# Patient Record
Sex: Male | Born: 1950
Health system: Southern US, Community
[De-identification: ages and names within clinical notes are randomized; demographics above are authoritative.]

## PROBLEM LIST (undated history)

## (undated) DIAGNOSIS — I10 Essential (primary) hypertension: Secondary | ICD-10-CM

## (undated) DIAGNOSIS — IMO0001 Reserved for inherently not codable concepts without codable children: Secondary | ICD-10-CM

## (undated) DIAGNOSIS — M1990 Unspecified osteoarthritis, unspecified site: Secondary | ICD-10-CM

## (undated) DIAGNOSIS — U071 COVID-19: Secondary | ICD-10-CM

## (undated) DIAGNOSIS — Z87442 Personal history of urinary calculi: Secondary | ICD-10-CM

## (undated) DIAGNOSIS — Z794 Long term (current) use of insulin: Secondary | ICD-10-CM

## (undated) DIAGNOSIS — I639 Cerebral infarction, unspecified: Secondary | ICD-10-CM

## (undated) DIAGNOSIS — R0602 Shortness of breath: Secondary | ICD-10-CM

## (undated) DIAGNOSIS — I2510 Atherosclerotic heart disease of native coronary artery without angina pectoris: Secondary | ICD-10-CM

## (undated) DIAGNOSIS — C801 Malignant (primary) neoplasm, unspecified: Secondary | ICD-10-CM

## (undated) DIAGNOSIS — E785 Hyperlipidemia, unspecified: Secondary | ICD-10-CM

## (undated) DIAGNOSIS — E119 Type 2 diabetes mellitus without complications: Secondary | ICD-10-CM

## (undated) HISTORY — DX: COVID-19: U07.1

## (undated) HISTORY — DX: Type 2 diabetes mellitus without complications: E11.9

## (undated) HISTORY — PX: HERNIA REPAIR: SHX51

## (undated) HISTORY — PX: KNEE ARTHROSCOPY: SUR90

## (undated) HISTORY — DX: Reserved for inherently not codable concepts without codable children: IMO0001

## (undated) HISTORY — PX: CHOLECYSTECTOMY: SHX55

## (undated) HISTORY — DX: Hyperlipidemia, unspecified: E78.5

## (undated) HISTORY — DX: Long term (current) use of insulin: Z79.4

## (undated) HISTORY — PX: OTHER SURGICAL HISTORY: SHX169

## (undated) HISTORY — PX: CARDIAC CATHETERIZATION: SHX172

---

## 2003-03-31 ENCOUNTER — Emergency Department (HOSPITAL_COMMUNITY): Admission: EM | Admit: 2003-03-31 | Discharge: 2003-03-31 | Payer: Self-pay | Admitting: Emergency Medicine

## 2003-04-15 ENCOUNTER — Ambulatory Visit (HOSPITAL_COMMUNITY): Admission: RE | Admit: 2003-04-15 | Discharge: 2003-04-15 | Payer: Self-pay | Admitting: General Surgery

## 2003-05-13 ENCOUNTER — Ambulatory Visit (HOSPITAL_COMMUNITY): Admission: RE | Admit: 2003-05-13 | Discharge: 2003-05-13 | Payer: Self-pay | Admitting: Gastroenterology

## 2003-06-14 ENCOUNTER — Encounter (INDEPENDENT_AMBULATORY_CARE_PROVIDER_SITE_OTHER): Payer: Self-pay | Admitting: *Deleted

## 2003-06-14 ENCOUNTER — Ambulatory Visit (HOSPITAL_COMMUNITY): Admission: RE | Admit: 2003-06-14 | Discharge: 2003-06-15 | Payer: Self-pay | Admitting: General Surgery

## 2004-05-12 ENCOUNTER — Encounter: Admission: RE | Admit: 2004-05-12 | Discharge: 2004-05-12 | Payer: Self-pay | Admitting: General Surgery

## 2004-05-15 ENCOUNTER — Ambulatory Visit (HOSPITAL_BASED_OUTPATIENT_CLINIC_OR_DEPARTMENT_OTHER): Admission: RE | Admit: 2004-05-15 | Discharge: 2004-05-15 | Payer: Self-pay | Admitting: General Surgery

## 2004-05-15 ENCOUNTER — Ambulatory Visit (HOSPITAL_COMMUNITY): Admission: RE | Admit: 2004-05-15 | Discharge: 2004-05-15 | Payer: Self-pay | Admitting: General Surgery

## 2004-07-06 ENCOUNTER — Encounter (INDEPENDENT_AMBULATORY_CARE_PROVIDER_SITE_OTHER): Payer: Self-pay | Admitting: *Deleted

## 2004-07-06 ENCOUNTER — Inpatient Hospital Stay (HOSPITAL_COMMUNITY): Admission: EM | Admit: 2004-07-06 | Discharge: 2004-07-08 | Payer: Self-pay | Admitting: Emergency Medicine

## 2005-01-19 ENCOUNTER — Encounter: Admission: RE | Admit: 2005-01-19 | Discharge: 2005-01-19 | Payer: Self-pay | Admitting: Neurology

## 2005-10-01 ENCOUNTER — Ambulatory Visit (HOSPITAL_COMMUNITY): Admission: RE | Admit: 2005-10-01 | Discharge: 2005-10-02 | Payer: Self-pay

## 2008-05-03 ENCOUNTER — Encounter: Admission: RE | Admit: 2008-05-03 | Discharge: 2008-05-03 | Payer: Self-pay | Admitting: Cardiology

## 2008-05-16 ENCOUNTER — Inpatient Hospital Stay (HOSPITAL_COMMUNITY): Admission: AD | Admit: 2008-05-16 | Discharge: 2008-05-17 | Payer: Self-pay | Admitting: Cardiology

## 2010-06-18 LAB — CARDIAC PANEL(CRET KIN+CKTOT+MB+TROPI)
CK, MB: 3 ng/mL (ref 0.3–4.0)
Relative Index: 2.9 — ABNORMAL HIGH (ref 0.0–2.5)
Total CK: 104 U/L (ref 7–232)
Troponin I: 0.14 ng/mL — ABNORMAL HIGH (ref 0.00–0.06)

## 2010-06-18 LAB — BASIC METABOLIC PANEL
BUN: 12 mg/dL (ref 6–23)
CO2: 31 mEq/L (ref 19–32)
Calcium: 9.2 mg/dL (ref 8.4–10.5)
Chloride: 103 mEq/L (ref 96–112)
Creatinine, Ser: 0.9 mg/dL (ref 0.4–1.5)
GFR calc Af Amer: >60 mL/min (ref >60)
GFR calc non Af Amer: >60 mL/min (ref >60)
Glucose, Bld: 207 mg/dL — ABNORMAL HIGH (ref 70–99)
Potassium: 4.5 mEq/L (ref 3.5–5.1)
Sodium: 139 mEq/L (ref 135–145)

## 2010-06-18 LAB — CBC
HCT: 45.2 % (ref 39.0–52.0)
Hemoglobin: 15.8 g/dL (ref 13.0–17.0)
MCHC: 35 g/dL (ref 30.0–36.0)
MCV: 91.4 fL (ref 78.0–100.0)
Platelets: 212 10*3/uL (ref 150–400)
RBC: 4.94 MIL/uL (ref 4.22–5.81)
RDW: 13.3 % (ref 11.5–15.5)
WBC: 7.9 10*3/uL (ref 4.0–10.5)

## 2010-06-18 LAB — GLUCOSE, CAPILLARY
Glucose-Capillary: 170 mg/dL — ABNORMAL HIGH (ref 70–99)
Glucose-Capillary: 203 mg/dL — ABNORMAL HIGH (ref 70–99)
Glucose-Capillary: 209 mg/dL — ABNORMAL HIGH (ref 70–99)
Glucose-Capillary: 223 mg/dL — ABNORMAL HIGH (ref 70–99)
Glucose-Capillary: 225 mg/dL — ABNORMAL HIGH (ref 70–99)

## 2010-06-18 LAB — PROTIME-INR
INR: 0.9 (ref 0.00–1.49)
Prothrombin Time: 12.4 seconds (ref 11.6–15.2)

## 2010-07-21 NOTE — Discharge Summary (Signed)
Alan Wang, Alan Wang                 ACCOUNT NO.:  [id number]   MEDICAL RECORD NO.:  [id number]          PATIENT TYPE:  INP   LOCATION:  2507                         FACILITY:  [facility]   PHYSICIAN:  Cristy Hilts. Jacinto Halim, MD       DATE OF BIRTH:  1950-07-27   DATE OF ADMISSION:  05/16/2008  DATE OF DISCHARGE:  05/17/2008                               DISCHARGE SUMMARY   DISCHARGE DIAGNOSES:  1. Coronary artery disease, elective left anterior descending drug-      eluting stent placement this admission.  2. Residual fairly diffuse distal right coronary artery and circumflex      disease to be treated medically.  3. Preserved left ventricular function.  4. Type 2 non-insulin-dependent diabetes.  5. Treated hypertension.  6. Treated dyslipidemia.  7. Transient ischemic attack in 2006, previously on Aggrenox, that was      discontinued this admission.   HOSPITAL COURSE:  The patient is a 60 year old male with the above  medical history who presented for a screening Myoview in February 2010.  This revealed severe anterior wall ischemia.  The patient was seen by  Dr. Jacinto Halim.  He was set up for an outpatient catheterization, this was  done on May 16, 2008.  This revealed diffuse distal PLA disease with  left-to-right collaterals.  Circumflex had an 80% narrowing and 99%  narrowing after the OM, and appeared to be small vessel.  There was a  large ramus intermedius branch with 40% stenosis in midvessel.  The LAD  had some proximal calcification.  After the third diagonal, there was a  95% long stenosis.  This was treated with XIENCE stents.  His EF was 50-  60%.  He tolerated the procedure well.  Abdominal angiogram showed no  significant iliac disease or renal artery disease.  Dr. Allyson Sabal feels he  can be discharged on May 17, 2008.   DISCHARGE MEDICATIONS:  1. Hyzaar 100/25 daily.  2. Cymbalta 30 mg a day.  3. Lipitor 20 mg a day.  4. Humulin 24 units in the morning and 26 units in the  evening.  5. Novolin on a sliding scale, usually takes 24 units in the morning      and 38 units in the evening.  6. Vitamin C.  7. Multivitamin daily.  8. Fish oil 1200 mg a day.  9. Aspirin 325 mg a day.  10.Plavix 75 mg a day.  11.Coreg 3.125 b.i.d.  12.Nitroglycerin 0.4 mg sublingual p.r.n.  13.He will resume his metformin 1 g b.i.d. on May 19, 2008.   LABORATORIES:  White count 7.9, hemoglobin 15.8, hematocrit 45.2, and  platelets 212.  Sodium 139, potassium 4.5, BUN 12, and creatinine 0.9.  His troponin was 0.14 in the afternoon of May 16, 2008.  EKG showed  sinus rhythm without acute changes.   DISPOSITION:  The patient is discharged in stable condition after  elective LAD drug-eluting stent placement.  He will follow up with Dr.  Jacinto Halim in a couple of weeks.      Abelino Derrick, P.A.  Cristy Hilts. Jacinto Halim, MD  Electronically Signed    LKK/MEDQ  D:  05/17/2008  T:  05/17/2008  Job:  [id number]

## 2010-07-21 NOTE — Cardiovascular Report (Signed)
NAMESEENA, FACE                 ACCOUNT NO.:  [id number]   MEDICAL RECORD NO.:  [id number]          PATIENT TYPE:  INP   LOCATION:  2807                         FACILITY:  [facility]   PHYSICIAN:  Cristy Hilts. Jacinto Halim, MD       DATE OF BIRTH:  1950-06-11   DATE OF PROCEDURE:  DATE OF DISCHARGE:                            CARDIAC CATHETERIZATION   PROCEDURE PERFORMED:  Percutaneous transluminal coronary angioplasty and  stenting of the mid LAD.   INDICATIONS:  [name] is a 57-year gentleman with hypertension,  hyperlipidemia, diabetes,who has a markedly abnormal stress test  revealing antral wall ischemia.  He underwent cardiac catheterization at  [facility] a week ago, revealing a high-grade 99% stenosis  in the mid LAD.  Now, he is brought for elective angioplasty of the  same.  He has severe multivessel disease including circumflex coronary  artery, which has diffuse long segment, 80% stenosis at the ostium and  99% stenosis in the mid segment.  However, these vessels were felt to be  small.  Hence, he is brought for elective angioplasty to the LAD.   INTERVENTION:  Please see my dictation dated May 10, 2008 for  angiographic data.   Briefly left main coronary artery is large-caliber vessel with a mild  calcification.   Circumflex coronary artery is a moderate caliber vessel with severe  ostial long segment 80% stenosis followed by 99% stenosis.  The origin  of a small obtuse marginal 1, which also has an ostial 90% stenosis.  The ramus intermediate is a moderate caliber vessel with mild diffuse  luminal irregularity.  LAD is calcified diffusely.  After the origin of  a moderate-sized diagonal 3, there is a 80% stenosis followed by a  tandem 99% stenosis in the mid segment.   INTERVENTION DATA:  Successful PTCA and stenting of the mid LAD with  implantation of a 3.0 x 23-mm XIENCE stent overlapped distally with a  2.75 x 12-mm XIENCE stent.  These stents were  post-dilated with a 3.0 x  15-mm Sprinter [place] at 18 atmospheric pressures.  Overall, the stenosis  was reduced from 99% to 0% with brisk TIMI III-TIMI III flow maintained  at end of the procedure.   RECOMMENDATIONS:  The patient will need aggressive risk modification.  He will need a surveillance stress Myoview probably in 6 months.  Given  the fact that the circumflex coronary artery is diffusely diseased.  I  may consider high risk intervention if the ischemic burden is severe.   A total of 175 mL of contrast was utilized for diagnostic angiography.   Please note, during the balloon inflation stenting, there was  significant ST elevations were noted anteriorly.   The patient remained asymptomatic during the entire procedure.   TECHNIQUE OF PROCEDURE:  Usual sterile precautions using a 7-French left  femoral arterial access, a 7-French XB 3.5 guide was utilized to engage  left main coronary artery.  Using Angiomax for anticoagulation, a 190-cm  wide, 0.014th of an inch Asahi [place] guidewire was utilized to cross  into the LAD and  the lesion was scored using a AngioSculpt 2.5 x 10 mm  balloon and multiple inflations were performed at peak of 6 atmospheric  pressures anywhere from 60-100 seconds.  Following this, I implanted a  2.75 x 12-mm XIENCE and a 3.0 x 23-mm XIENCE stent distally and  proximally overlapping each other and postdilated this with a Voyager [place]  3.0 x 15-mm balloon at peak of 18 atmospheric pressure for 40 seconds.  Having performed this angiography an intracoronary nitroglycerin was  administered and excellent results were documented.  The guidewire was  withdrawn angiographically.  Guide catheter disengaged and pulled out of  body.   Left femoral arteriography was performed through the arterial access  sheath and access closed with StarClose with excellent hemostasis.  The  patient tolerated the procedure well.  No immediate complications were  noted       Vonna Kotyk R. Jacinto Halim, MD  Electronically Signed     JRG/MEDQ  D:  05/16/2008  T:  05/16/2008  Job:  [id number]   cc:   Dorisann Frames, M.D.

## 2010-07-24 NOTE — Op Note (Signed)
NAME:  Alan Wang, Alan Wang                           ACCOUNT NO.:  [id number]   MEDICAL RECORD NO.:  [id number]                   PATIENT TYPE:  AMB   LOCATION:  ENDO                                 FACILITY:  [facility]   PHYSICIAN:  James L. Malon Kindle., M.D.          DATE OF BIRTH:  [birth year]   DATE OF PROCEDURE:  05/13/2003  DATE OF DISCHARGE:                                 OPERATIVE REPORT   PROCEDURE:  Colonoscopy.   MEDICATIONS:  Fentanyl 100 mcg, Versed 10 mg IV.   INSTRUMENT USED:  Olympus pediatric adjustable colonoscope.   INDICATIONS FOR PROCEDURE:  Strong family history of colon polyps.   DESCRIPTION OF PROCEDURE:  The procedure had been explained to the patient  and consent obtained.  With the patient in the left lateral decubitus  position, the Olympus scope was inserted and advanced.  The prep was  excellent.  We were able to advance to the cecum.  The ileocecal valve and  appendiceal orifice were seen.  The scope was withdrawn and the cecum,  ascending colon, transverse colon, splenic flexure, descending and sigmoid  colon were seen well.  There was no diverticular disease and no polyps were  seen.  The scope was withdrawn in the rectum.  There were internal  hemorrhoids in the anal canal.  The scope was withdrawn.  The patient  tolerated the procedure well.   ASSESSMENT:  1. Family history of colon polyps.  V19.8  2. Internal hemorrhoids.  455.0   PLAN:  Will recommend yearly hemoccults and repeat the procedure in five  years.                                               James L. Malon Kindle., M.D.    Waldron Session  D:  05/13/2003  T:  05/13/2003  Job:  [id number]   cc:   Leonie Man, M.D.  [address]  [address]  [place]  [place] [address]  Fax: [phone]   Margaretmary Bayley, M.D.  [address], [address]  [place]  [place] [address]  Fax: [phone]

## 2010-07-24 NOTE — H&P (Signed)
NAMEKENRIC, GINGER NO.:  [id number]   MEDICAL RECORD NO.:  [id number]          PATIENT TYPE:  INP   LOCATION:  1824                         FACILITY:  [facility]   PHYSICIAN:  Marlan Palau, M.D.  DATE OF BIRTH:  01-14-1951   DATE OF ADMISSION:  07/06/2004  DATE OF DISCHARGE:                                HISTORY & PHYSICAL   HISTORY OF PRESENT ILLNESS:  Alan Wang is a 59 year old right-handed  white male born 07-28-1950 with a history of obesity, diabetes,  hypertension.  Patient is brought into the hospital at this point for an  evaluation of a transient event of speech disturbance, possible left arm  weakness.  Patient was at work at the time, noted around 8:30 a.m. had  fairly sudden onset of difficulty with speech.  Patient knew what he wanted  to say, but when he tried to speak noted that he was mixing up words.  Patient could understand what was being said to him, however.  Patient also  was unable to use a calculator, could not add/subtract normally.  Patient  was brought to the hospital.  On the way to the hospital noted some left  upper extremity weakness, he believes.  Patient has had some resolution of  the above deficits, but has felt headache, some nausea, queasiness, no  vomiting.  Patient otherwise at this point feels his speech has returned to  normal and has had no weakness or numbness.  Patient denied any visual field  deficit with the above event.  Patient denies any previous episodes similar  to this.  CT of the head done through the emergency room is unremarkable.  Neurology is called for further evaluation.   PAST MEDICAL HISTORY:  1.  Probable transient ischemic attack associated with aphasia and possible      left upper extremity weakness as above.  2.  Hypertension.  3.  Diabetes.  4.  Gallbladder resection in the past.  5.  History of hernia surgery six weeks ago.  6.  History of cataracts, but has not had surgery for  this.   CURRENT MEDICATIONS:  1.  Fortamet SR 1000 mg twice a day.  2.  NovoLog U 134 units in the morning, 36 units at night.  3.  NovoLog N 40 units in the morning, 42 units at night.  4.  Cozaar 100 mg a day.  5.  Lasix 40 mg a day.  6.  Patient takes two Aleve daily.  7.  Tylenol for arthritis.  8.  Vitamin C 1000 mg daily.  9.  Centrum Silver vitamins one a day.  10. Aspirin 81 mg a day.  11. Fish oil 1000 mg daily.  12. Alfalfa 650 mg daily.   ALLERGIES:  Patient has some intolerance to AVANDIA, but no other drug  allergies.   HABITS:  Patient does not smoke.  Drinks alcohol on occasion.   SOCIAL HISTORY:  This patient lives in the [place] area.  Is married.  Has two children who are alive and well.  Patient owns  a car  parts store.   FAMILY HISTORY:  Notable in that mother is alive with arthritis problems.  Father is alive with low back pain, diabetes.  Patient has one sister with  diabetes, chronic fatigue syndrome.  There is a paternal great grandmother  with diabetes.  Maternal grandmother with stroke.  No family history of  cancer.  Both grandfathers have heart disease.   REVIEW OF SYSTEMS:  No recent fevers or chills.  Patient does note a  headache at this point.  Denies neck pain, shortness of breath, chest pain,  palpitations of the heart.  Denies abdominal pain, but has had some  queasiness.  Denies any problems controlling the bowels or bladder.  Has had  some recent diarrhea, however, associated immediately after the hernia  surgery.  Denies blackout spells.   PHYSICAL EXAMINATION:  VITAL SIGNS:  Blood pressure 180/101, heart rate 90,  respiratory rate 18, temperature afebrile.  GENERAL:  This patient is a moderately obese white male who is alert and  cooperative at the time of examination.  HEENT:  Head is atraumatic.  Eyes:  Pupils are equal, round, and reactive to  light.  Disks are flat bilaterally.  NECK:  Supple.  No carotid bruits  noted.  RESPIRATORY:  Clear.  CARDIOVASCULAR:  Regular rate and rhythm.  No obvious murmurs or rubs noted.  ABDOMEN:  Positive bowel sounds.  No organomegaly or tenderness noted.  EXTREMITIES:  Without significant edema.  NEUROLOGIC:  Cranial nerves as above.  Facial symmetry is present.  Patient  has good sensation to the face to pin prick, soft touch bilaterally.  Has  good strength to facial muscles, muscles to head turning, shoulder shrug  bilaterally.  Speech is well enunciated, not aphasic.  Motor test reveals  5/5 strength in all fours.  Good symmetric motor tone is noted throughout.  Sensory testing is intact to pin prick, soft touch, vibratory sensation  throughout.  Patient has good finger-nose-finger, heel-to-shin bilaterally.  No drift is seen.  Deep tendon reflexes depressed, but symmetric.  Toes are  neutral bilaterally.  Patient was not ambulated.  No aphasia, again, is  noted on today's evaluation.   CT of the head is unremarkable.  Blood work to date reveals a white count of  8.1, hemoglobin of 15.8, hematocrit of 46.1, MCV of 87.7, platelets of 271.  The rest of the blood work is still pending.   EKG, chest x-ray are pending.   IMPRESSION:  1.  History of diabetes.  2.  Hypertension.  3.  New onset transient aphasia, probable transient ischemic attack event.   The patient had some positive aphasia that lasted greater than an hour.  Patient is now at his baseline.  Will admit for further evaluation of  transient ischemic attack.   PLAN:  1.  MRI scan of the brain.  2.  MR angiogram with intracranial/extracranial vessels.  3.  2-D echocardiogram.  4.  Aspirin therapy.  Will follow patient's clinical course while in-house.      CKW/MEDQ  D:  07/06/2004  T:  07/06/2004  Job:  [id number]   cc:   Margaretmary Bayley, M.D.  [address], [address]  [place]  [place] [address]  Fax: [phone]

## 2010-07-24 NOTE — Op Note (Signed)
NAME:  Alan Wang, Alan Wang                           ACCOUNT NO.:  [id number]   MEDICAL RECORD NO.:  [id number]                   PATIENT TYPE:  OIB   LOCATION:  5727                                 FACILITY:  [facility]   PHYSICIAN:  Leonie Man, M.D.                DATE OF BIRTH:  1951-02-15   DATE OF PROCEDURE:  06/14/2003  DATE OF DISCHARGE:                                 OPERATIVE REPORT   PREOPERATIVE DIAGNOSIS:  Chronic calculus cholecystitis.   POSTOPERATIVE DIAGNOSIS:  Chronic calculus cholecystitis.   PROCEDURE:  Laparoscopic cholecystectomy with interoperative cholangiogram.   SURGEON:  Luisa Hart L. Lurene Shadow, M.D.   ASSISTANT:  Lebron Conners, M.D.   ANESTHESIA:  General.   NOTE:  This patient is a 60 year old diabetic man with the onset of  epigastric and right upper quadrant abdominal pain which, on evaluation by  ultrasound, shows non-shadowing gallstones.  He has a negative HIDA scan and  normal liver function tests.  He comes to the operating room now for  laparoscopic cholecystectomy and interoperative cholangiogram after all  questions have been answered and consent obtained.   PROCEDURE:  Following the induction of satisfactory general anesthesia with  the patient positioned supine, the abdomen was prepped and draped routinely.  Open laparoscopy created at the umbilicus with insertion of a Hasson cannula  and insufflation of the peritoneal cavity to 14 mmHg pressure using carbon  dioxide.  The camera was inserted and visual exploration of the abdomen  shows the liver edge to be sharp.  Liver surfaces smooth.  There were  multiple adhesions to the gallbladder from chronic inflammation.  None of  the small or large intestines viewed appeared to be abnormal.   Under direct vision, epigastric and lateral ports were placed.  The  gallbladder was grasped and retracted cephalad and multiple adhesions taken  down from the wall of the gallbladder and dissection was carried  down to the  region of the ampulla with isolation of the cystic artery and cystic duct.  The cystic artery is isolated, doubly clipped and transected.  The cystic  duct was isolated carrying the cystic duct to the cystic duct common duct  gallbladder and to the cystic duct gallbladder junction.  The cystic duct is  clipped proximally and opened and a Riddick catheter passed into the abdomen  through a 14 gauge Angiocath and inserted into the cystic duct and a cystic  duct cholangiogram carried out with Renografin.  The resulting cholangiogram  showed free flow of contrast into the duodenum, no filling defects were  noted.  The cystic duct catheter was removed and the cystic duct doubly  clipped and transected.   The gallbladder was then dissected free from the liver bed using  electrocautery and maintaining hemostasis along the course of the  gallbladder.  A single perforation was made of the gallbladder with some  spillage of bile, but there was  no spillage of any stones.  The gallbladder  was removed and the liver bed checked for hemostasis and additional bleeding  points treated with electrocautery.  The gallbladder was placed in an  endopouch and removed from the abdomen without difficulty.  The right upper  quadrant was thoroughly irrigated with normal saline.  This was all  aspirated free.  Sponge, instrument and sharp counts were verified.  The  wounds were closed in layers.  The umbilical wound was closed in two layers  with 0 Dexon and 4-0 Monocryl.  The epigastric and lateral flank wound  was closed with 4-0 Monocryl.  All wounds had Steri-Strips and sterile  dressings applied, the anesthetic reversed, and the patient removed from the  operating room to the recovery room in stable condition, having tolerated  the procedure well.                                               Leonie Man, M.D.    PB/MEDQ  D:  06/14/2003  T:  06/15/2003  Job:  [id number]   cc:   Margaretmary Bayley, M.D.  [address]  [place]  [place] [address]  Fax: [phone]

## 2010-07-24 NOTE — Discharge Summary (Signed)
NAMEJARRAD, Alan Wang                 ACCOUNT NO.:  [id number]   MEDICAL RECORD NO.:  [id number]          PATIENT TYPE:  INP   LOCATION:  3032                         FACILITY:  [facility]   PHYSICIAN:  Pramod P. Pearlean Brownie, MD    DATE OF BIRTH:  January 10, 1951   DATE OF ADMISSION:  07/06/2004  DATE OF DISCHARGE:  07/08/2004                                 DISCHARGE SUMMARY   DISCHARGE DIAGNOSES:  1.  Left brain transient ischemic attack.  2.  Diabetes.  3.  Hypertension.  4.  Obesity.  5.  Status post hernia repair.  6.  Status post gallbladder resection.  7.  Cataracts.   DISCHARGE MEDICATIONS:  1.  Aggrenox one p.o. daily x14 days then increase to b.i.d.  2.  Tylenol two tablets 30 minutes to one hour prior to Aggrenox dose for      first week.  3.  Cozaar 100 mg a day.  4.  Lasix 20 mg a day.  5.  Fortamet 1000 mg b.i.d.  6.  NovoLog N 40 units q.a.m. and 40 units q.p.m.  7.  NovoLog R 34 units q.a.m. and 36 units q.p.m.  8.  Aleve two tablets every morning.  9.  Vitamin C 1000 mg a day.  10. Centrum Silver one a day.  11. Fish oil 1000 mg a day.  12. Alfalfa 650 mg a day.  13. Ultram 50 mg may take one to two tablets q.6h. p.r.n. headache.   STUDIES PERFORMED:  1.  CT of the brain shows no acute abnormality.  2.  MRI of the brain shows no acute abnormality.  3.  MRA of the neck shows mild stenosis proximal right internal carotid      artery.  4.  MRA of the head is normal.  5.  Chest x-ray shows no acute or significant findings or slight      peribronchial thickening.  6.  EKG shows normal sinus rhythm, left atrial enlargement.  Cannot rule out      inferior infarct, age undetermined.  Nonspecific T-wave abnormality.  7.  A 2-D echocardiogram  has ejection fraction of 55 to 65% with inability      to evaluate LV wall motion.  Cannot rule out embolic source.   LABORATORY STUDIES:  Hemoglobin A1c 7.8.  Homocystine normal.  Cholesterol  160, triglycerides 228, HDL 41, and LDL 73.   CBC was normal.  Coagulation  studies with PT of 11.3, otherwise normal.  Chemistry normal except for  elevated glucose at 168. Liver function tests were normal.  Cardiac enzymes  were negative x1.   HISTORY OF PRESENT ILLNESS:  [name] is a 60 year old right-handed  white male who has a history of hypertension, diabetes and obesity who is  brought into the hospital the day of admission to evaluate a transient event  of speech disturbance and possible left arm weakness.  Patient was at work  at the time and noted around 8:30 a.m., had sudden onset of difficulty with  speech.  The patient knew what he wanted to say but when he  tried to speak,  noted that he was mixing up his words.  The patient stated he could  understand what was being said to him.  Patient was brought to the hospital.  Once in the emergency room, he believed his speech had returned to normal  and he had no weakness.  Patient denies any previous similar episode.  CT of  the head in the emergency room is unremarkable.  Neurology was called to  evaluate further.  Patient will be admitted to the hospital for further work-  up.  He did not receive TPA due to quick resolution of symptoms.   HOSPITAL COURSE:  MRI was unrevealing for an acute infarct but patient was  felt to have a left brain TIA caused in part by risk factors of poorly  controlled diabetes with elevated hemoglobin A1c and elevated blood  pressures in the 190s/100 ranges.  Patient also with poor diet at home.  He  adjusts his insulin based on his perceived needs.  He is overweight and he  does not exercise.   CONDITION ON DISCHARGE:  Patient alert and oriented x3.  Speech clear.  No  aphasia.  His strength is normal.  His face is symmetric.  His sensation is  intact and he has no ataxia.  His chest is clear to auscultation.  His heart  rate is regular.   PLAN:  1.  Discharge home with wife.  2.  Aggrenox for secondary stroke prevention.  3.   Patient needs tight risk factor control including diabetes and      hypertension.  4.  Will refer for nutrition management for uptake on diabetes education for      classes for he and his wife.  5.  Ultram as needed for headache caused by Aggrenox.  Headaches resolved      after one week.      SB/MEDQ  D:  07/08/2004  T:  07/08/2004  Job:  [id number]   cc:   Margaretmary Bayley, M.D.  [address]  [place]  [place] [address]  Fax: [phone]   Leonie Man, M.D.  [address]  [address]  [place]  [place] [address]

## 2010-07-24 NOTE — Op Note (Signed)
Alan Wang, Alan Wang                 ACCOUNT NO.:  [id number]   MEDICAL RECORD NO.:  [id number]          PATIENT TYPE:  AMB   LOCATION:  DAY                          FACILITY:  [facility]   PHYSICIAN:  Lebron Conners, M.D.   DATE OF BIRTH:  1950/06/08   DATE OF PROCEDURE:  10/01/2005  DATE OF DISCHARGE:                                 OPERATIVE REPORT   PREOPERATIVE DIAGNOSIS:  Incisional hernia.   POSTOPERATIVE DIAGNOSIS:  Incisional hernia.   OPERATION:  Incisional hernia repair.   SURGEON:  Dr. Orson Slick.   ANESTHESIA:  General.   SPECIMEN:  None.   BLOOD LOSS:  Minimal.   COMPLICATIONS:  None.  The patient to PACU in good condition.   PROCEDURE:  After the patient was monitored and asleep and had routine  preparation and draping of the abdomen, I made a short supraumbilical  midline incision utilizing most of the previous incision and taking it  upward just slightly.  I dissected down through the subcutaneous tissue and  scar until I came to an obvious hernia sac and some associated mesh.  The  defect seemed to be at the upper end of a previous hernia repair.  I  dissected all of the sac and mesh away from the fascia in that region and  saw a fairly discrete deficit.  I dissected down a bit over the mesh and as  I did so I could feel that there was another defect immediately under the  umbilicus.  I extended my incision downward around the umbilicus and  dissected the umbilicus totally away from the fascia and previous hernia  repair and identified a second defect.  I dissected along the healthy fascia  caudad for 3-4 cm away from that and then I freed up the hernia defect and  previous hernia repair, all the way around getting at least 3-4 cm of  dissection in the deep subcutaneous plane just superficial to the muscular  fascia all way around.  I then freed up sac from the edges of the fascia and  reduced it and closed both of the hernia defects with 2-0 Prolene running  and  interrupted suture.  I then trimmed a piece of polypropylene mesh to fit  the dissected defect and I sewed that in near the edges of the dissection  with running basting 2-0 Prolene stitch through and through the mesh and  taking nice deep bites of the abdominal fascia.  I felt that reinforced the  hernia repair quite adequately.  I used a few sutures to sew the mesh down  to the fascia in the middle of it.  I then placed a 10-mm suction drain  through a separate stab incision, sewed that in with 2-0 Prolene, and placed  it so it lay comfortably across this mesh.  I closed the subcutaneous  tissues with running 3-0 Vicryl, sewing it down to the mesh in several  places and sewing the umbilical  skin down to the mesh to reconstitute normal anatomy.  I then closed the  skin with staples and applied suction to the  bulb of the suction drain.  It  held suction nicely.  Hemostasis was good.  Sponge, needle and instrument  counts were correct.  The patient went to PACU in stable condition after  application of a bandage.      Lebron Conners, M.D.  Electronically Signed     WB/MEDQ  D:  10/01/2005  T:  10/01/2005  Job:  [id number]   cc:   Margaretmary Bayley, M.D.  Fax: [phone]

## 2010-07-24 NOTE — Op Note (Signed)
NAMEBARNELL, SHIEH                 ACCOUNT NO.:  [id number]   MEDICAL RECORD NO.:  [id number]          PATIENT TYPE:  AMB   LOCATION:  DSC                          FACILITY:  [facility]   PHYSICIAN:  Leonie Man, M.D.   DATE OF BIRTH:  [birth year]   DATE OF PROCEDURE:  05/15/2004  DATE OF DISCHARGE:                                 OPERATIVE REPORT   PREOPERATIVE DIAGNOSIS:  Incisional hernia of the umbilicus.   POSTOPERATIVE DIAGNOSIS:  Incisional hernia of the umbilicus.   PROCEDURE:  Repair incisional hernia with mesh.   SURGEON:  Leonie Man, M.D.   ASSISTANT:  OR tech.   ANESTHESIA:  General.   SPECIMENS:  No specimens were sent.   BLOOD LOSS:  Minimal.   COMPLICATIONS:  No immediate complications.   The patient is a 60 year old man who is status post laparoscopic  cholecystectomy and he developed incisional hernia just above the umbilicus  in his trocar site.  He comes to the operating room now for repair after the  risks and potential benefits of surgery have been discussed and all  questions answered.   PROCEDURE:  Following induction of satisfactory general anesthesia, the  patient is positioned supinely. The abdomen is routinely prepped and draped  to be included in a sterile operative field. The old midline incision was  opened up through skin, subcutaneous tissue, dissecting down to the hernia  sac and dissecting around to free the edges of the hernia sac. Hernia sac is  amputated and discarded. The defect is cleared in all sides and a Bard  medium-sized plug is placed into the defect and sutured into the fascial  edge with interrupted 0 Novofil sutures. The repair is noted to be intact.  Sponge and instrument counts were verified and the abdominal wound closed in  layers as follows. The subcutaneous tissues closed with a running 3-0 Vicryl  suture and the skin closed with running 4-0 Monocryl, cruciate suture placed  intradermally;  wound was then reinforced  with Steri-Strips and a sterile  dressing applied. Anesthetic reversed. The patient removed from the  operating room to the recovery room in stable condition.  He tolerated the  procedure well.    PB/MEDQ  D:  05/15/2004  T:  05/15/2004  Job:  [id number]

## 2011-03-05 ENCOUNTER — Other Ambulatory Visit (HOSPITAL_COMMUNITY): Payer: Self-pay | Admitting: Cardiology

## 2011-03-05 DIAGNOSIS — R9439 Abnormal result of other cardiovascular function study: Secondary | ICD-10-CM

## 2011-03-08 ENCOUNTER — Other Ambulatory Visit: Payer: Self-pay

## 2011-03-08 ENCOUNTER — Ambulatory Visit (HOSPITAL_COMMUNITY)
Admission: RE | Admit: 2011-03-08 | Discharge: 2011-03-09 | Disposition: A | Discharge status: Home / Self Care | Payer: 59 | Source: Ambulatory Visit | Attending: Cardiology | Admitting: Cardiology

## 2011-03-08 ENCOUNTER — Encounter (HOSPITAL_COMMUNITY): Payer: Self-pay | Admitting: Cardiology

## 2011-03-08 ENCOUNTER — Encounter (HOSPITAL_COMMUNITY)
Admission: RE | Disposition: A | Discharge status: Home / Self Care | Payer: Self-pay | Source: Ambulatory Visit | Attending: Cardiology

## 2011-03-08 DIAGNOSIS — E1165 Type 2 diabetes mellitus with hyperglycemia: Secondary | ICD-10-CM | POA: Insufficient documentation

## 2011-03-08 DIAGNOSIS — E1169 Type 2 diabetes mellitus with other specified complication: Secondary | ICD-10-CM

## 2011-03-08 DIAGNOSIS — I10 Essential (primary) hypertension: Secondary | ICD-10-CM

## 2011-03-08 DIAGNOSIS — E785 Hyperlipidemia, unspecified: Secondary | ICD-10-CM | POA: Insufficient documentation

## 2011-03-08 DIAGNOSIS — Z8673 Personal history of transient ischemic attack (TIA), and cerebral infarction without residual deficits: Secondary | ICD-10-CM | POA: Insufficient documentation

## 2011-03-08 DIAGNOSIS — IMO0002 Reserved for concepts with insufficient information to code with codable children: Secondary | ICD-10-CM | POA: Insufficient documentation

## 2011-03-08 DIAGNOSIS — I2510 Atherosclerotic heart disease of native coronary artery without angina pectoris: Secondary | ICD-10-CM | POA: Diagnosis present

## 2011-03-08 DIAGNOSIS — N529 Male erectile dysfunction, unspecified: Secondary | ICD-10-CM | POA: Insufficient documentation

## 2011-03-08 DIAGNOSIS — Z794 Long term (current) use of insulin: Secondary | ICD-10-CM

## 2011-03-08 DIAGNOSIS — E669 Obesity, unspecified: Secondary | ICD-10-CM | POA: Insufficient documentation

## 2011-03-08 DIAGNOSIS — Z9861 Coronary angioplasty status: Secondary | ICD-10-CM

## 2011-03-08 DIAGNOSIS — E782 Mixed hyperlipidemia: Secondary | ICD-10-CM

## 2011-03-08 DIAGNOSIS — E1159 Type 2 diabetes mellitus with other circulatory complications: Secondary | ICD-10-CM

## 2011-03-08 DIAGNOSIS — R0602 Shortness of breath: Secondary | ICD-10-CM

## 2011-03-08 HISTORY — DX: Cerebral infarction, unspecified: I63.9

## 2011-03-08 HISTORY — PX: LEFT HEART CATHETERIZATION WITH CORONARY ANGIOGRAM: SHX5451

## 2011-03-08 HISTORY — DX: Essential (primary) hypertension: I10

## 2011-03-08 LAB — CBC
MCV: 90.4 fL (ref 78.0–100.0)
Platelets: 144 10*3/uL — ABNORMAL LOW (ref 150–400)
RBC: 4.92 MIL/uL (ref 4.22–5.81)
WBC: 7.5 10*3/uL (ref 4.0–10.5)

## 2011-03-08 LAB — BASIC METABOLIC PANEL
BUN: 15 mg/dL (ref 6–23)
Chloride: 102 mEq/L (ref 96–112)
GFR calc Af Amer: >90 mL/min (ref >90)
Potassium: 4 mEq/L (ref 3.5–5.1)

## 2011-03-08 LAB — GLUCOSE, CAPILLARY: Glucose-Capillary: 208 mg/dL — ABNORMAL HIGH (ref 70–99)

## 2011-03-08 LAB — PROTIME-INR
INR: 0.96 (ref 0.00–1.49)
Prothrombin Time: 13 seconds (ref 11.6–15.2)

## 2011-03-08 LAB — PLATELET INHIBITION P2Y12: Platelet Function  P2Y12: 186 [PRU] — ABNORMAL LOW (ref 194–418)

## 2011-03-08 SURGERY — LEFT HEART CATHETERIZATION WITH CORONARY ANGIOGRAM
Anesthesia: LOCAL

## 2011-03-08 MED ORDER — OXYCODONE-ACETAMINOPHEN 5-325 MG PO TABS
1.0000 | ORAL_TABLET | ORAL | Status: DC | PRN
  Administered 2011-03-08: 1 via ORAL
  Filled 2011-03-08: qty 1

## 2011-03-08 MED ORDER — NIACIN ER (ANTIHYPERLIPIDEMIC) 500 MG PO TBCR: 1000.0000 mg | EXTENDED_RELEASE_TABLET | Freq: Every day | ORAL | Status: DC

## 2011-03-08 MED ORDER — METFORMIN HCL ER 500 MG PO TB24: 1000.0000 mg | ORAL_TABLET | Freq: Two times a day (BID) | ORAL | Status: DC

## 2011-03-08 MED ORDER — INSULIN ASPART 100 UNIT/ML ~~LOC~~ SOLN
38.0000 [IU] | Freq: Every day | SUBCUTANEOUS | Status: DC
  Administered 2011-03-08: 38 [IU] via SUBCUTANEOUS

## 2011-03-08 MED ORDER — LOSARTAN POTASSIUM-HCTZ 100-25 MG PO TABS: 1.0000 | ORAL_TABLET | Freq: Every day | ORAL | Status: DC

## 2011-03-08 MED ORDER — LIDOCAINE HCL (PF) 1 % IJ SOLN
INTRAMUSCULAR | Status: AC
  Filled 2011-03-08: qty 30

## 2011-03-08 MED ORDER — HYDROMORPHONE HCL PF 1 MG/ML IJ SOLN: 0.5000 mg | INTRAMUSCULAR | Status: DC | PRN

## 2011-03-08 MED ORDER — SODIUM CHLORIDE 0.9 % IJ SOLN: 3.0000 mL | INTRAMUSCULAR | Status: DC | PRN

## 2011-03-08 MED ORDER — DULOXETINE HCL 30 MG PO CPEP
30.0000 mg | ORAL_CAPSULE | Freq: Every day | ORAL | Status: DC
  Filled 2011-03-08: qty 1

## 2011-03-08 MED ORDER — SODIUM CHLORIDE 0.9 % IV SOLN: INTRAVENOUS | Status: DC

## 2011-03-08 MED ORDER — RANOLAZINE ER 500 MG PO TB12
1000.0000 mg | ORAL_TABLET | Freq: Two times a day (BID) | ORAL | Status: DC
  Administered 2011-03-08 – 2011-03-09 (×2): 1000 mg via ORAL
  Filled 2011-03-08 (×3): qty 2

## 2011-03-08 MED ORDER — INSULIN ASPART 100 UNIT/ML ~~LOC~~ SOLN
26.0000 [IU] | Freq: Every day | SUBCUTANEOUS | Status: DC
Start: 2011-03-09 — End: 2011-03-09
  Administered 2011-03-09: 26 [IU] via SUBCUTANEOUS
  Filled 2011-03-08: qty 3

## 2011-03-08 MED ORDER — SODIUM CHLORIDE 0.9 % IV SOLN: 250.0000 mL | INTRAVENOUS | Status: DC | PRN

## 2011-03-08 MED ORDER — HEPARIN (PORCINE) IN NACL 2-0.9 UNIT/ML-% IJ SOLN
INTRAMUSCULAR | Status: AC
  Filled 2011-03-08: qty 2000

## 2011-03-08 MED ORDER — CLOPIDOGREL BISULFATE 300 MG PO TABS
ORAL_TABLET | ORAL | Status: AC
  Filled 2011-03-08: qty 1

## 2011-03-08 MED ORDER — ONDANSETRON HCL 4 MG/2ML IJ SOLN: 4.0000 mg | Freq: Four times a day (QID) | INTRAMUSCULAR | Status: DC | PRN

## 2011-03-08 MED ORDER — VITAMIN C 500 MG PO TABS: 1000.0000 mg | ORAL_TABLET | Freq: Every day | ORAL | Status: DC

## 2011-03-08 MED ORDER — INSULIN NPH (HUMAN) (ISOPHANE) 100 UNIT/ML ~~LOC~~ SUSP
24.0000 [IU] | Freq: Two times a day (BID) | SUBCUTANEOUS | Status: DC
  Filled 2011-03-08: qty 10

## 2011-03-08 MED ORDER — NIACIN ER (ANTIHYPERLIPIDEMIC) 500 MG PO TBCR
1000.0000 mg | EXTENDED_RELEASE_TABLET | Freq: Every day | ORAL | Status: DC
  Filled 2011-03-08 (×2): qty 2

## 2011-03-08 MED ORDER — ASPIRIN 81 MG PO CHEW
324.0000 mg | CHEWABLE_TABLET | ORAL | Status: AC
  Administered 2011-03-08: 324 mg via ORAL

## 2011-03-08 MED ORDER — HYDROCHLOROTHIAZIDE 25 MG PO TABS
25.0000 mg | ORAL_TABLET | Freq: Every day | ORAL | Status: DC
  Administered 2011-03-09: 25 mg via ORAL
  Filled 2011-03-08 (×2): qty 1

## 2011-03-08 MED ORDER — INSULIN NPH (HUMAN) (ISOPHANE) 100 UNIT/ML ~~LOC~~ SUSP
42.0000 [IU] | Freq: Every day | SUBCUTANEOUS | Status: DC
  Administered 2011-03-08: 42 [IU] via SUBCUTANEOUS

## 2011-03-08 MED ORDER — INSULIN ASPART 100 UNIT/ML ~~LOC~~ SOLN
0.0000 [IU] | Freq: Three times a day (TID) | SUBCUTANEOUS | Status: DC
  Administered 2011-03-08: 5 [IU] via SUBCUTANEOUS
  Administered 2011-03-09: 2 [IU] via SUBCUTANEOUS

## 2011-03-08 MED ORDER — METFORMIN HCL ER 500 MG PO TB24: 500.0000 mg | ORAL_TABLET | Freq: Four times a day (QID) | ORAL | Status: DC

## 2011-03-08 MED ORDER — DULOXETINE HCL 30 MG PO CPEP
30.0000 mg | ORAL_CAPSULE | Freq: Every day | ORAL | Status: DC
  Filled 2011-03-08 (×2): qty 1

## 2011-03-08 MED ORDER — BIVALIRUDIN 250 MG IV SOLR
INTRAVENOUS | Status: AC
  Filled 2011-03-08: qty 250

## 2011-03-08 MED ORDER — SODIUM CHLORIDE 0.9 % IV SOLN
INTRAVENOUS | Status: AC
  Administered 2011-03-08: 11:00:00 via INTRAVENOUS

## 2011-03-08 MED ORDER — VERAPAMIL HCL 2.5 MG/ML IV SOLN
INTRAVENOUS | Status: AC
  Filled 2011-03-08: qty 2

## 2011-03-08 MED ORDER — ASPIRIN 81 MG PO CHEW
CHEWABLE_TABLET | ORAL | Status: AC
  Filled 2011-03-08: qty 4

## 2011-03-08 MED ORDER — VITAMIN C 500 MG PO TABS
1000.0000 mg | ORAL_TABLET | Freq: Every day | ORAL | Status: DC
  Administered 2011-03-09: 1000 mg via ORAL
  Filled 2011-03-08 (×2): qty 2

## 2011-03-08 MED ORDER — INSULIN NPH (HUMAN) (ISOPHANE) 100 UNIT/ML ~~LOC~~ SUSP
24.0000 [IU] | Freq: Every day | SUBCUTANEOUS | Status: DC
Start: 2011-03-09 — End: 2011-03-09
  Administered 2011-03-09: 24 [IU] via SUBCUTANEOUS
  Filled 2011-03-08: qty 10

## 2011-03-08 MED ORDER — INSULIN ASPART 100 UNIT/ML ~~LOC~~ SOLN: 0.0000 [IU] | Freq: Every day | SUBCUTANEOUS | Status: DC

## 2011-03-08 MED ORDER — NITROGLYCERIN 0.2 MG/ML ON CALL CATH LAB
INTRAVENOUS | Status: AC
  Filled 2011-03-08: qty 1

## 2011-03-08 MED ORDER — CARVEDILOL 12.5 MG PO TABS
12.5000 mg | ORAL_TABLET | Freq: Two times a day (BID) | ORAL | Status: DC
  Administered 2011-03-08 – 2011-03-09 (×2): 12.5 mg via ORAL
  Filled 2011-03-08 (×3): qty 1

## 2011-03-08 MED ORDER — ASPIRIN EC 81 MG PO TBEC
81.0000 mg | DELAYED_RELEASE_TABLET | Freq: Every day | ORAL | Status: DC
  Filled 2011-03-08: qty 1

## 2011-03-08 MED ORDER — LOSARTAN POTASSIUM 50 MG PO TABS
100.0000 mg | ORAL_TABLET | Freq: Every day | ORAL | Status: DC
  Administered 2011-03-09: 100 mg via ORAL
  Filled 2011-03-08 (×2): qty 2

## 2011-03-08 MED ORDER — MIDAZOLAM HCL 2 MG/2ML IJ SOLN
INTRAMUSCULAR | Status: AC
  Filled 2011-03-08: qty 2

## 2011-03-08 MED ORDER — ACETAMINOPHEN 325 MG PO TABS
650.0000 mg | ORAL_TABLET | ORAL | Status: DC | PRN
  Administered 2011-03-08: 650 mg via ORAL
  Filled 2011-03-08: qty 2

## 2011-03-08 MED ORDER — CLOPIDOGREL BISULFATE 75 MG PO TABS: 75.0000 mg | ORAL_TABLET | Freq: Every day | ORAL | Status: DC

## 2011-03-08 MED ORDER — ASPIRIN 81 MG PO CHEW: 324.0000 mg | CHEWABLE_TABLET | ORAL | Status: DC

## 2011-03-08 MED ORDER — ROSUVASTATIN CALCIUM 20 MG PO TABS
20.0000 mg | ORAL_TABLET | Freq: Every day | ORAL | Status: DC
  Administered 2011-03-08: 20 mg via ORAL
  Filled 2011-03-08 (×2): qty 1

## 2011-03-08 MED ORDER — SODIUM CHLORIDE 0.9 % IJ SOLN
3.0000 mL | Freq: Two times a day (BID) | INTRAMUSCULAR | Status: DC
  Administered 2011-03-08 (×2): 3 mL via INTRAVENOUS

## 2011-03-08 MED ORDER — ADULT MULTIVITAMIN W/MINERALS CH
1.0000 | ORAL_TABLET | Freq: Every day | ORAL | Status: DC
  Administered 2011-03-09: 1 via ORAL
  Filled 2011-03-08: qty 1

## 2011-03-08 MED ORDER — HYDROMORPHONE HCL PF 2 MG/ML IJ SOLN
INTRAMUSCULAR | Status: AC
  Filled 2011-03-08: qty 1

## 2011-03-08 MED ORDER — INSULIN REGULAR HUMAN 100 UNIT/ML IJ SOLN
26.0000 [IU] | Freq: Two times a day (BID) | INTRAMUSCULAR | Status: DC
  Filled 2011-03-08 (×7): qty 0.4

## 2011-03-08 NOTE — Progress Notes (Signed)
TR BAND REMOVAL  LOCATION:    right radial  DEFLATED PER PROTOCOL:    yes  TIME BAND OFF / DRESSING APPLIED:    1330   SITE UPON ARRIVAL:    Level 0  SITE AFTER BAND REMOVAL:    Level 0  REVERSE ALLEN'S TEST:     positive  CIRCULATION SENSATION AND MOVEMENT:    Within Normal Limits   yes  COMMENTS:   Tolerated well

## 2011-03-08 NOTE — Brief Op Note (Signed)
03/08/2011  10:13 AM  PATIENT:  Alan Wang  60 y.o. male  PRE-OPERATIVE DIAGNOSIS:  Chest pain  POST-OPERATIVE DIAGNOSIS:  * No post-op diagnosis entered *  PROCEDURE:  Procedure(s): LEFT HEART CATHETERIZATION WITH CORONARY ANGIOGRAM, PTCA and stent Ramus intermediate.   SURGEON:  Surgeon(s): Pamella Pert   DICTATION: .Other Dictation: Dictation Number [id number]

## 2011-03-08 NOTE — Progress Notes (Addendum)
Cardiac Rehab 1440- 1520 On arrival pt sleeping, wife present . Wife states that he had a headache. Completed discharge education with wife. She voices understanding. Wife declines Outpt. CRP, states that he works long work hours and would not be able to attend.

## 2011-03-08 NOTE — H&P (Signed)
  Please see paper chart  

## 2011-03-09 ENCOUNTER — Other Ambulatory Visit: Payer: Self-pay

## 2011-03-09 LAB — BASIC METABOLIC PANEL
BUN: 14 mg/dL (ref 6–23)
CO2: 29 mEq/L (ref 19–32)
Calcium: 9.1 mg/dL (ref 8.4–10.5)
Glucose, Bld: 94 mg/dL (ref 70–99)
Sodium: 141 mEq/L (ref 135–145)

## 2011-03-09 LAB — CBC
HCT: 43.4 % (ref 39.0–52.0)
Hemoglobin: 15.1 g/dL (ref 13.0–17.0)
MCH: 31.5 pg (ref 26.0–34.0)
MCHC: 34.8 g/dL (ref 30.0–36.0)
MCV: 90.6 fL (ref 78.0–100.0)
RBC: 4.79 MIL/uL (ref 4.22–5.81)

## 2011-03-09 LAB — GLUCOSE, CAPILLARY: Glucose-Capillary: 139 mg/dL — ABNORMAL HIGH (ref 70–99)

## 2011-03-09 MED ORDER — METFORMIN HCL ER 500 MG PO TB24: 500.0000 mg | ORAL_TABLET | Freq: Four times a day (QID) | ORAL | Status: DC

## 2011-03-09 NOTE — Discharge Summary (Signed)
  Discharge summary. Dictation # [id number]

## 2011-03-09 NOTE — Discharge Summary (Signed)
Alan Wang, Alan Wang                 ACCOUNT NO.:  [id number]  MEDICAL RECORD NO.:  [id number]  LOCATION:  2508                         FACILITY:  [facility]  PHYSICIAN:  Pamella Pert, MD DATE OF BIRTH:  [birth year]  DATE OF ADMISSION:  03/08/2011 DATE OF DISCHARGE:  03/09/2011                              DISCHARGE SUMMARY   DISCHARGE DIAGNOSES: 1. Coronary artery disease of native vessels, status post percutaneous     transluminal coronary angioplasty and stenting of the ostial ramus     intermedius coronary artery with implantation of a 3.5 x 15-mm drug-     eluting Integrity stent. 2. Coronary artery disease with history of percutaneous transluminal     coronary angioplasty and stenting of the mid left anterior     descending artery with implantation of a 3.0 x 23-mm overlapped     with a 2.75 x 13-mm Xience stent that was done in March 2010. 3. Diabetes mellitus type 2, uncontrolled. 4. Hyperlipidemia, mixed. 5. Hypertension. 6. Obesity. 7. History of stroke in 2008 and 2009, presenting with dysarthria. 8. Erectile dysfunction.  RECOMMENDATION:  The patient will be discharged home today.  He will resume his home medications, which include: 1. Ranexa 1000 mg p.o. b.i.d. 2. Plavix 75 mg p.o. daily. 3. Crestor 20 mg p.o. daily. 4. Cymbalta 30 mg p.o. daily. 5. Regular insulin Humulin R on a sliding scale basis. 6. Hyzaar 25/100 1 p.o. q.a.m. 7. Vitamin C 1000 mg p.o. daily. 8. Centrum Silver 1 p.o. daily. 9. Aspirin 81 mg p.o. daily. 10.Niaspan 1000 mg p.o. at bedtime. 11.Coreg 12.5 mg p.o. b.i.d. 12.Metformin ER 500 mg 2 tablets p.o. b.i.d. 13.Fish oil capsules 1012 mg p.o. daily.  The patient will follow up     with me in the outpatient basis in 2 weeks.  BRIEF HISTORY:  [name] is a very pleasant 61 year old gentleman with known history of coronary artery disease and diabetes who was been having worsening shortness of breath in the last 5-6 months,  worsening over the last 1-2 months.  He underwent outpatient stress testing, which had revealed severe inferior and inferolateral wall ischemia.  He was brought to the Cardiac Catheterization Lab on March 08, 2011, and underwent successful angioplasty to his ramus intermedius coronary artery.  The patient has residual high-grade circumflex coronary artery disease, which is heavily calcified.  He probably will need a rotational atherectomy of the same.  It has got acute angle turn, which is fairly complex.  The patient tolerated the procedure well without any complications.  Following morning, his electrolytes were within normal limits with BUN of 14, creatinine of 0.78.  CBC remained stable with a hemoglobin of 15.1, hematocrit of 43.4, white count was 9.8, platelet count was 145,000.  His blood sugars remained elevated during the hospitalization around 200.  The patient was felt stable for discharge the following day.  His right radial access site was stable without any hematoma.  The EKG also remained stable the following day revealing normal sinus rhythm with poor R-wave progression, occasional PVCs, and nonspecific T-wave changes.     Pamella Pert, MD     JRG/MEDQ  D:  03/09/2011  T:  03/09/2011  Job:  [id number]  cc:   Cain Saupe, MD Dorisann Frames, M.D.

## 2011-03-09 NOTE — Cardiovascular Report (Signed)
[name], [name]                 ACCOUNT NO.:  [id number]  MEDICAL RECORD NO.:  [id number]  LOCATION:  2508                         FACILITY:  [facility]  PHYSICIAN:  Pamella Pert, MD DATE OF BIRTH:  [birth year]  DATE OF PROCEDURE:  03/08/2011 DATE OF DISCHARGE:                           CARDIAC CATHETERIZATION   PROCEDURES PERFORMED: 1. Left ventriculography. 2. Selective right and left coronary arteriography. 3. Percutaneous transluminal coronary angioplasty and stenting of the     ostial, ramus intermedius branch with a 3.5 x 15-mm Integrity stent.  INDICATION:  [name] is a pleasant 61 year old gentleman with history of known coronary artery disease and history of mid-LAD stenting with implantation of a 3.0 x 23-mm overlapped with a 2.75 x 12-mm Xience drug-eluting stents in April 2010.  He had been having worsening shortness of breath and dyspnea on exertion recently.  He underwent a stress Myoview on February 12, 2011, and this had revealed significant EKG changes with Lexiscan infusion, which was suggestive of ischemia and also the perfusion imaging study demonstrated an inferior wall moderate ischemia.  Given this, he is now brought to the Cardiac Cath Lab to evaluate his coronary anatomy.  HEMODYNAMIC DATA:  The left ventricular pressure was 100/10 with end- diastolic of 13 mmHg.  Aortic pressure was 111/68 with a mean of 86 mmHg.  There was no pressure gradient across the aortic valve.  ANGIOGRAPHIC DATA:  Left ventricle:  Left ventricular systolic function was normal with ejection fraction of 60%.  There was no significant mitral regurgitation, no significant wall motion abnormality. Right coronary artery:  The right coronary artery is a large-caliber vessel and a dominant vessel.  The distal right coronary artery is diffusely diseased and has faint collaterals from the left system. There is catheter diseased especially noted in the PL branch.  PDA branch is  small. Left main coronary artery:  The left main coronary artery is a large- caliber vessel, which is heavily calcified. LAD:  LAD is a large-caliber vessel, severely calcified.  The previously placed stents in the mid-LAD are widely patent.  Again, the diagonals are small-to-moderate size utmost with high-grade diffuse disease throughout. Ramus intermedius:  Ramus intermedius is a very large-caliber vessel. Ostium of the ramus intermedius shows an 80% stenosis, which was much more severe even with a 2-mm balloon inflation, there was total occlusion. Circumflex coronary artery:  Circumflex coronary artery is a moderate- caliber vessel.  Proximal and mid circumflex has a long diffuse heavily calcified 90-99% stenosis.  This is acute angle takeoff from the left main coronary artery.  There are faint collaterals noted from the left system to the distal right coronary artery.  INTERVENTION DATA:  It is very difficult procedure.  Inability to place the guidewire into the distal circumflex coronary artery.  Initial attempt was for kissing stenting into the circumflex and ramus intermedius.  Hence, this procedure was abandoned and we concentrated on ramus intermedius angioplasty alone.  Successful PTCA and stenting of the ostial, ramus intermedius with implantation of a 3.5 x 15-mm Integrity stent.  This stent was deployed at 10 atmospheric pressure for 50 seconds followed by postdilatation with a 3.5 x 12-mm  [place] Sprinter at 16 atmospheric pressure for 30 seconds.  Overall stenosis reduced from 80-90% to 0% with brisk TIMI 3 to TIMI 3 flow maintained at the end of the procedure.  Excellent results were noted without any side branch compromise.  RECOMMENDATION:  The patient will need continued aggressive risk modification.  The stenosis in the circumflex coronary artery has clearly progressed from 2010, where it was about 70% or so to now 90-99% stenosis.  We could certainly consider  rotational atherectomy to the circumflex coronary artery; however, the acute angle makes it very difficult.  I could not pass a 2-mm balloon into the circumflex coronary artery.  However, the PCI will be guided by symptomatology.  A total of 200-225 mL of contrast was utilized for diagnostic and intervention procedure.  TECHNIQUE OF THE PROCEDURE:  Under sterile precautions using a 6-French right radial access, a 6-French TIG 4 catheter was advanced into the ascending aorta.  The selective left and right coronary arteriography was performed.  The catheter was then pulled out of the body over exchanged length J-wire.  A 5-French pigtail catheter was advanced back into the ascending aorta and into the left ventricle.  Left ventriculography was performed in the RAO projection.  Catheter pulled into the ascending aorta and pulled out of the body over a J-wire.  TECHNIQUE OF INTERVENTION:  Using Angiomax for anticoagulation, a 6- [place] XB 3.5 guide catheter was utilized to engage the left main coronary artery.  I initially used an intuition guidewire to advance into the ramus intermedius branch.  Following this, I utilized a BMW wire into the circumflex coronary artery because of inability to pass it into the circumflex coronary artery.  Used a Arts administrator to cross into the circumflex coronary artery.  Then, I attempted to pass a 2-mm balloon through the circumflex coronary artery, but because of inability to do so due to severe calcification, I left the lesion alone and used it as a guidewire marker for implantation of the stent into the ostium of the ramus intermedius branch.  The 2-mm x 12-mm Sprinter Legend was advanced into the ramus intermedius branch and angioplasty was performed at 14 atmospheric pressure for 45 seconds followed by implantation of a 3.5 x 15-mm Resolute drug-eluting stent at 10 atmospheric pressure for 55 seconds at the ramus intermedius branch  ostium.  This was followed by postdilatation with a 3.75 x 12-mm Sanders Sprinter at 16 atmospheric pressure for 30 seconds.  Following this, the intracoronary nitroglycerin was administered and angiography was performed.  Excellent results without any side branch compromise that is LAD and circumflex was evident.  The guidewire was withdrawn angiography.  The guide catheter disengaged and pulled out of the body. The patient tolerated the procedure well.  There were no immediate complication.     Pamella Pert, MD     JRG/MEDQ  D:  03/08/2011  T:  03/09/2011  Job:  [id number]  cc:   Cain Saupe, MD Dorisann Frames, M.D.

## 2011-03-10 MED FILL — Dextrose Inj 5%: INTRAVENOUS | Qty: 50 | Status: AC

## 2011-03-25 ENCOUNTER — Encounter (HOSPITAL_COMMUNITY): Payer: Self-pay | Admitting: Pharmacy Technician

## 2011-04-05 NOTE — H&P (Signed)
HOPI: Patient present for f/u of CAD.   No chest discomfort. Since coronary angiography and angioplasty on 03/08/2011 patient states that he is feeling remarkably well and his dyspnea has improved.  He still has not returned to complete work yet.  He denies any chest pain. Prior to coronary angiography and angioplasty he had developed severe shortness of breath which is his anginal equivalent.  ROS: Arthritis No, life-style limiting NA;  Cramping of the legs and feet at night or with activity No; Diabetes Yes, Controlled Not controlled;  Hypothyroidism No;  Previous Stroke  ?TIA 2008-2009 presenting with dysarthria, Previous GI Bleed No ,  Recent weight change No.   Other systems negative.     Known allergies: Avandia (leg swelling).   Past Medical History (Major events, hospitalizations, surgeries):  Heart cath 03/08/11: Successful PTCA to large Ramus intermediate with 3.5x15 Integrity stent.  has residual >90% calcified stenosis of circumflex coronary artery. Previous Mid LAD 3.0x23 and 2.75x12 Xience placed in 05/2008 was patent. diffuse distal RCA disease.  TIA before stents presenting with dysarthria.    Ongoing medical problems: Diabetes; Hypertension; Hyperlipidemia.     Family medical history: Mother died at age 33 of Pulmonary Fibrosis, had a defibrillator; Father is 82 yrs old-diabetic; hypertension; possible Parkinson's Disease; Sister is diabetic, chronic fatigue syndrome( 47 yrs. old).     Social history: No smoking, No alcohol; Married; 2 children. sedentary.  ALLERGY:       Mild Avandia (rosiglitazone) oral tablet allergy resulting in Patchy swelling-skin (skin)  Objective: HR 70/min, RR 14/min. BP 130/79 mm Hg  Well built and obese bodily habitus, in no acute distress.   Appears stated age.   There is no cyanosis.   HEENT: normal limits.   NECK: no JVD noted.   NECK: no JVD noted.   CAROTIDS: No carotid bruits are noted.     CARDIAC EXAM: S1 S2 normal, no gallop or murmur.       CHEST EXAM: No tenderness of chest wall.   LUNGS: Clear to percuss and auscultate.   No rales or ronchi.     ABDOMEN: No hepatosplenomegaly.   BS normal in all 4 quadrants.   Abdomen is non-tender.     EXTREMITY: No edema.   Normal pulses.     His right radial artery access site has healed well.  MUSCULOSKELETAL EXAM: Intact with full range of motion in all 4 extremities.     NEUROLOGIC EXAM: Grossly intact without any focal deficits.   Alert O x 3.     VASCULAR EXAM: No bruit, all pulses intact.   No prominent pulse felt in the abdomen.   No verricose veins.  Impression: 1. CAD/ASHD :  Presents for f/u after Heart cath 03/08/11: Successful PTCA to large Ramus intermediate with 3.5x15 Integrity stent.  has residual >90% calcified stenosis of circumflex coronary artery. Previous Mid LAD 3.0x23 and 2.75x12 Xience placed in 05/2008 was patent. diffuse distal RCA disease.  The patient states that his symptoms of dyspnea has significantly improved.  He also states that his energy level has improved significantly.  Overall he is feeling well.   Lexiscan nuclear stress: 02/12/11: 1 mm ST depression suggestive of ischemia noted with pharmacologic stress testing in the inferior and inferoapical leads.     There was a moderate sized inferior and inferoapical scar with moderate peri-infarct ischemia.The perfusion study demonstrated moderate chest wall attenuation artifact.  ECG 03/24/11: NSR. Poor R progression. Non specific ST change.  shvc echo 05/2008:  Normal LV, mild diastolic dysfunction.    2. Hypertension, controlled.    3. Hyperlipidemia, controlled.   lab 09/01/2010: Total cholesterol 112, triglycerides 64, HDL 51, LDL 48,.    4. Diabetes Mellitus II uncontrlolled recently.    5. Previous Stroke  ? TIA 2008-2009 presenting with dysarthria.   Plan:  Mechanism of underlying disease process and action of medications discussed with the patient.   Continue current medications unchanged.  Have  discussed with the patient and his wife in detail regarding the findings of coronary angiography.  He has a circumflex coronary artery stenosis which is high-grade and subtotally occluded nearly.  This is severely calcified with an acute angle.  However this is amenable for coronary angioplasty by performing appendectomy.  I also discussed with him regarding medical therapy continued observation.  Patient would like to proceed with repeat coronary angiography and possible plasty if feasible.   Given the high-grade nature of coronary stenosis I suspect he will either completely occlude the vessel or over time  development of collaterals or can potentially have acute  or chronic total occlusion.  Hence I do feel proceeding with coronary angiography with an attempt for PCI may not be a bad option. We discussed regarding risks, benefits, alternatives to this including stress testing, CTA and continued medical therapy.   Patient wants to proceed.   Understands <1-2% risk of death, stroke, MI, urgent CABG, bleeding, infection, renal failure but not limited to these.   OV after the procedure.

## 2011-04-06 ENCOUNTER — Encounter (HOSPITAL_COMMUNITY): Payer: Self-pay | Admitting: General Practice

## 2011-04-06 ENCOUNTER — Ambulatory Visit (HOSPITAL_COMMUNITY)
Admission: RE | Admit: 2011-04-06 | Discharge: 2011-04-07 | Disposition: A | Discharge status: Home / Self Care | Payer: 59 | Source: Ambulatory Visit | Attending: Cardiology | Admitting: Cardiology

## 2011-04-06 ENCOUNTER — Encounter (HOSPITAL_COMMUNITY)
Admission: RE | Disposition: A | Discharge status: Home / Self Care | Payer: Self-pay | Source: Ambulatory Visit | Attending: Cardiology

## 2011-04-06 ENCOUNTER — Other Ambulatory Visit: Payer: Self-pay

## 2011-04-06 DIAGNOSIS — E785 Hyperlipidemia, unspecified: Secondary | ICD-10-CM | POA: Insufficient documentation

## 2011-04-06 DIAGNOSIS — R0602 Shortness of breath: Secondary | ICD-10-CM | POA: Insufficient documentation

## 2011-04-06 DIAGNOSIS — E119 Type 2 diabetes mellitus without complications: Secondary | ICD-10-CM | POA: Insufficient documentation

## 2011-04-06 DIAGNOSIS — I10 Essential (primary) hypertension: Secondary | ICD-10-CM | POA: Insufficient documentation

## 2011-04-06 DIAGNOSIS — I2510 Atherosclerotic heart disease of native coronary artery without angina pectoris: Secondary | ICD-10-CM | POA: Insufficient documentation

## 2011-04-06 HISTORY — DX: Unspecified osteoarthritis, unspecified site: M19.90

## 2011-04-06 HISTORY — DX: Atherosclerotic heart disease of native coronary artery without angina pectoris: I25.10

## 2011-04-06 HISTORY — DX: Shortness of breath: R06.02

## 2011-04-06 HISTORY — PX: ANGIOPLASTY: SHX39

## 2011-04-06 HISTORY — PX: PERCUTANEOUS CORONARY STENT INTERVENTION (PCI-S): SHX5485

## 2011-04-06 LAB — GLUCOSE, CAPILLARY: Glucose-Capillary: 78 mg/dL (ref 70–99)

## 2011-04-06 SURGERY — PERCUTANEOUS CORONARY STENT INTERVENTION (PCI-S)
Anesthesia: LOCAL

## 2011-04-06 MED ORDER — INSULIN NPH (HUMAN) (ISOPHANE) 100 UNIT/ML ~~LOC~~ SUSP: 22.0000 [IU] | Freq: Two times a day (BID) | SUBCUTANEOUS | Status: DC

## 2011-04-06 MED ORDER — HYDROCHLOROTHIAZIDE 25 MG PO TABS
25.0000 mg | ORAL_TABLET | Freq: Every day | ORAL | Status: DC
  Administered 2011-04-07: 25 mg via ORAL
  Filled 2011-04-06 (×2): qty 1

## 2011-04-06 MED ORDER — LOSARTAN POTASSIUM 50 MG PO TABS
100.0000 mg | ORAL_TABLET | Freq: Every day | ORAL | Status: DC
  Administered 2011-04-07: 100 mg via ORAL
  Filled 2011-04-06 (×2): qty 2

## 2011-04-06 MED ORDER — NITROGLYCERIN 0.2 MG/ML ON CALL CATH LAB
INTRAVENOUS | Status: AC
  Filled 2011-04-06: qty 1

## 2011-04-06 MED ORDER — INSULIN NPH (HUMAN) (ISOPHANE) 100 UNIT/ML ~~LOC~~ SUSP
22.0000 [IU] | Freq: Every day | SUBCUTANEOUS | Status: DC
  Administered 2011-04-07: 22 [IU] via SUBCUTANEOUS
  Filled 2011-04-06: qty 10

## 2011-04-06 MED ORDER — ROSUVASTATIN CALCIUM 20 MG PO TABS
20.0000 mg | ORAL_TABLET | Freq: Every day | ORAL | Status: DC
  Filled 2011-04-06 (×2): qty 1

## 2011-04-06 MED ORDER — MIDAZOLAM HCL 2 MG/2ML IJ SOLN
INTRAMUSCULAR | Status: AC
  Filled 2011-04-06: qty 2

## 2011-04-06 MED ORDER — HYDROMORPHONE HCL PF 2 MG/ML IJ SOLN
INTRAMUSCULAR | Status: AC
  Filled 2011-04-06: qty 1

## 2011-04-06 MED ORDER — DULOXETINE HCL 30 MG PO CPEP
30.0000 mg | ORAL_CAPSULE | Freq: Every day | ORAL | Status: DC
  Filled 2011-04-06 (×2): qty 1

## 2011-04-06 MED ORDER — SODIUM CHLORIDE 0.9 % IV SOLN: INTRAVENOUS | Status: AC

## 2011-04-06 MED ORDER — ADULT MULTIVITAMIN W/MINERALS CH
1.0000 | ORAL_TABLET | Freq: Every day | ORAL | Status: DC
  Administered 2011-04-07: 1 via ORAL
  Filled 2011-04-06: qty 1

## 2011-04-06 MED ORDER — ASPIRIN 81 MG PO CHEW
324.0000 mg | CHEWABLE_TABLET | ORAL | Status: AC
  Administered 2011-04-06: 324 mg via ORAL
  Filled 2011-04-06: qty 4

## 2011-04-06 MED ORDER — LIDOCAINE HCL (PF) 1 % IJ SOLN
INTRAMUSCULAR | Status: AC
  Filled 2011-04-06: qty 30

## 2011-04-06 MED ORDER — VITAMIN C 500 MG PO TABS
1000.0000 mg | ORAL_TABLET | Freq: Every day | ORAL | Status: DC
  Administered 2011-04-07: 1000 mg via ORAL
  Filled 2011-04-06: qty 2

## 2011-04-06 MED ORDER — ONDANSETRON HCL 4 MG/2ML IJ SOLN: 4.0000 mg | Freq: Four times a day (QID) | INTRAMUSCULAR | Status: DC | PRN

## 2011-04-06 MED ORDER — METFORMIN HCL ER 500 MG PO TB24: 500.0000 mg | ORAL_TABLET | Freq: Four times a day (QID) | ORAL | Status: DC

## 2011-04-06 MED ORDER — RANOLAZINE ER 500 MG PO TB12
1000.0000 mg | ORAL_TABLET | Freq: Two times a day (BID) | ORAL | Status: DC
  Administered 2011-04-06 – 2011-04-07 (×2): 1000 mg via ORAL
  Filled 2011-04-06 (×3): qty 2

## 2011-04-06 MED ORDER — ASPIRIN EC 81 MG PO TBEC
81.0000 mg | DELAYED_RELEASE_TABLET | Freq: Every day | ORAL | Status: DC
  Administered 2011-04-06: 81 mg via ORAL
  Filled 2011-04-06 (×2): qty 1

## 2011-04-06 MED ORDER — ACETAMINOPHEN 325 MG PO TABS: 650.0000 mg | ORAL_TABLET | ORAL | Status: DC | PRN

## 2011-04-06 MED ORDER — INSULIN ASPART 100 UNIT/ML ~~LOC~~ SOLN
35.0000 [IU] | Freq: Every day | SUBCUTANEOUS | Status: DC
  Administered 2011-04-06: 35 [IU] via SUBCUTANEOUS

## 2011-04-06 MED ORDER — NIACIN ER (ANTIHYPERLIPIDEMIC) 500 MG PO TBCR
1000.0000 mg | EXTENDED_RELEASE_TABLET | Freq: Every day | ORAL | Status: DC
  Administered 2011-04-06: 1000 mg via ORAL
  Filled 2011-04-06 (×2): qty 2

## 2011-04-06 MED ORDER — INSULIN NPH (HUMAN) (ISOPHANE) 100 UNIT/ML ~~LOC~~ SUSP: 45.0000 [IU] | Freq: Every day | SUBCUTANEOUS | Status: DC

## 2011-04-06 MED ORDER — SODIUM CHLORIDE 0.9 % IV SOLN: 250.0000 mL | INTRAVENOUS | Status: DC | PRN

## 2011-04-06 MED ORDER — CARVEDILOL 12.5 MG PO TABS
12.5000 mg | ORAL_TABLET | Freq: Two times a day (BID) | ORAL | Status: DC
  Administered 2011-04-06 – 2011-04-07 (×2): 12.5 mg via ORAL
  Filled 2011-04-06 (×4): qty 1

## 2011-04-06 MED ORDER — INSULIN ASPART 100 UNIT/ML ~~LOC~~ SOLN
24.0000 [IU] | Freq: Every day | SUBCUTANEOUS | Status: DC
  Administered 2011-04-07: 24 [IU] via SUBCUTANEOUS
  Filled 2011-04-06: qty 3

## 2011-04-06 MED ORDER — SODIUM CHLORIDE 0.9 % IJ SOLN: 3.0000 mL | INTRAMUSCULAR | Status: DC | PRN

## 2011-04-06 MED ORDER — SODIUM CHLORIDE 0.9 % IJ SOLN: 3.0000 mL | Freq: Two times a day (BID) | INTRAMUSCULAR | Status: DC

## 2011-04-06 MED ORDER — SODIUM CHLORIDE 0.9 % IV SOLN
INTRAVENOUS | Status: DC
  Administered 2011-04-06: 08:00:00 via INTRAVENOUS

## 2011-04-06 MED ORDER — BIVALIRUDIN 250 MG IV SOLR
INTRAVENOUS | Status: AC
  Filled 2011-04-06: qty 250

## 2011-04-06 MED ORDER — CLOPIDOGREL BISULFATE 75 MG PO TABS
75.0000 mg | ORAL_TABLET | Freq: Every day | ORAL | Status: DC
  Administered 2011-04-07: 75 mg via ORAL
  Filled 2011-04-06: qty 1

## 2011-04-06 MED ORDER — HEPARIN (PORCINE) IN NACL 2-0.9 UNIT/ML-% IJ SOLN
INTRAMUSCULAR | Status: AC
  Filled 2011-04-06: qty 2000

## 2011-04-06 MED ORDER — INSULIN REGULAR HUMAN 100 UNIT/ML IJ SOLN: 24.0000 [IU] | Freq: Two times a day (BID) | INTRAMUSCULAR | Status: DC

## 2011-04-06 MED ORDER — LOSARTAN POTASSIUM-HCTZ 100-25 MG PO TABS: 1.0000 | ORAL_TABLET | Freq: Every day | ORAL | Status: DC

## 2011-04-06 NOTE — Cardiovascular Report (Signed)
NAMEGREGGORY, SAFRANEK                 ACCOUNT NO.:  [id number]  MEDICAL RECORD NO.:  [id number]  LOCATION:  2502                         FACILITY:  [facility]  PHYSICIAN:  Pamella Pert, MD DATE OF BIRTH:  04-25-1950  DATE OF PROCEDURE: DATE OF DISCHARGE:                           CARDIAC CATHETERIZATION   REFERRING PHYSICIAN:  Cain Saupe, MD  PROCEDURE PERFORMED:  PTCA and stenting of the proximal and mid circumflex coronary artery (two-vessel angioplasty).  INDICATION:  Mr. Tallman is a pleasant 61 year old gentleman with history of known coronary artery disease and history of diabetes, hypertension, hyperlipidemia.  He was having worsening shortness of breath and had undergone outpatient stress testing which had revealed inferolateral ischemia.  He underwent intervention to the large ramus intermediate branch on March 08, 2011.  He had a high-grade greater than 99% stenosis of the proximal and mid circumflex coronary artery stenosis. It was felt that it was reasonable to proceed with angioplasty as it was a very large vessel.  Hence, he was brought for elective angioplasty of the same.  HEMODYNAMIC DATA:  The aortic pressure was 132/73 with a mean of 73 mmHg.  ANGIOGRAPHIC DATA:  Left main coronary artery:  Left main is large- caliber vessel.  Smooth and normal.  LAD:  LAD is a large-caliber vessel which has got diffuse mild luminal irregularity in the proximal segment.  Mild diffuse calcification is evident.  The previously placed mid LAD stents are widely patent.  This is a 3.0 x 23 mm overlapped with a 2.7 x 7.5 x 12 mm Zion stent placed in March 2010.  Ramus Intermediate:  Ramus intermediate is a very large-caliber vessel. The previously placed ostial ramus intermediate stent 3.5 x 15 mm integrity on March 08, 2011, is widely patent.  Circumflex coronary artery:  Circumflex coronary artery is a moderate- caliber vessel.  There is a heavily calcified, high-grade  99% stenosis in the proximal segment and also in the mid segment.  INTERVENTION DATA:  Extremely difficult procedure almost like a chronic total occlusion.  Multiple guidewires and multiple balloons had to be utilized.  Finally, we were successfully able to angioplastied this with a balloon followed by stenting.  A 26 mm resolute integrity stent was deployed at 10 atmospheric pressure followed by postdilatation with a 2.75 x 21 mm [place] sprinter at 14 atmospheric pressure x3 throughout the stented segment.  Overall stenosis was reduced from 99% to 0% with brisk TIMI-2 to TIMI-3 flow at the end of the procedure.  The patient did have some chest discomfort during balloon angioplasty which was 7/10 in intensity.  Postprocedure, had a chest pain of 3/10 in intensity but was feeling well.  PROCEDURE PERFORMED:  Right femoral arteriogram and closure of right femoral artery access with Perclose.  RECOMMENDATION:  The patient will be discharged home with outpatient followup in the morning if he remains stable.  He will need continued aggressive risk factor modification.  I suspect he will have significant improvement in his overall angina and also dyspnea.  A total of 200 mL of contrast was utilized for diagnostic and intervention procedure.  TECHNICAL PROCEDURE:  Under sterile precautions using a 6-French right femoral artery  access, a 6-French XB 3.5 guide catheter was utilized to engage left main coronary artery.  I utilized a Fielder XT 0.009 of an inch wire to cross into the circumflex artery with great difficulty.  A 1.25 x 10 mm sprinter Legend balloon was utilized and multiple balloon angioplasty was performed in proximal and mid segment.  In spite of this, I had great difficulty in advancing the 2 mm balloon.  I exchanged the Fielder XT to a mailman 300 cm wire, followed by balloon angioplasty with a 2 mm x 20 mm Mini trek at 12 atmospheric pressure for 45 seconds. Having performed  this, while we were trying to exchange the wire and we lost the wire position.  Hence a choice PT MS 300 cm wire was utilized to cross through the high-grade circumflex coronary artery which was almost behaving like a chronic total occlusion.  After crossing this, I was able to utilize the 1.25 mm balloon and multiple balloon angioplasty was performed throughout the circumflex coronary artery, proximal, and mid segment followed by balloon angioplasty with a 2.0 x 20 mm Mini trek followed by repeat balloon angioplasty with a 2.5 x 21 mm [place] sprinter. I utilized a 2.5 x 21 mm [place] sprinter and balloon angioplasty was performed followed by implantation of resolute integrity 2.75 x 26 mm stent into the proximal and mid segment of the circumflex coronary artery which was deployed at 10 atmospheric pressure for 51 seconds. Having performed this, postdilatation was performed a 2.75 x 21 mm [place] sprinter at 14 atmospheric pressure x3.  I did jell a small to moderate- size obtuse marginal 1, however, by the time we had done with angioplasty, the vessel had started to open back up.  I suspect that he will have a good angiographic and clinical improvement in his symptoms. Having performed this, we pulled out guide wires out, angiography repeated.  Guide catheter disengaged and pulled out of body.  Right femoral arteriography was performed through the arterial access sheath and access was closed with Perclose with excellent hemostasis.     Pamella Pert, MD     JRG/MEDQ  D:  04/06/2011  T:  04/06/2011  Job:  [id number]  cc:   Cain Saupe, MD

## 2011-04-06 NOTE — Interval H&P Note (Signed)
History and Physical Interval Note:  04/06/2011 9:58 AM  Alan Wang  has presented today for surgery, with the diagnosis of CAD  The various methods of treatment have been discussed with the patient and family. After consideration of risks, benefits and other options for treatment, the patient has consented to  Procedure(s): PERCUTANEOUS CORONARY STENT INTERVENTION (PCI-S) as a surgical intervention .  The patients' history has been reviewed, patient examined, no change in status, stable for surgery.  I have reviewed the patients' chart and labs.  Questions were answered to the patient's satisfaction.     Alan Wang,JAGADEESH R  No change in HPI since last evaluation.

## 2011-04-06 NOTE — Brief Op Note (Signed)
04/06/2011  12:21 PM  PATIENT: Alan Wang  PRE-OPERATIVE DIAGNOSIS:  CAD   POST-OPERATIVE DIAGNOSIS: Same  PROCEDURE (S):  leftHEART CATHETERIZATION WITH CORONARY/PTCA and stenting of circumflex coronary artery  Cardiologist: Jeanella Cara, MD, [facility].:    Referring MD: Other: Cain Saupe, MD      DICTATION: .Other Dictation: Dictation Number [id number]   PATIENT DISPOSITION:  2500

## 2011-04-07 ENCOUNTER — Other Ambulatory Visit: Payer: Self-pay

## 2011-04-07 LAB — BASIC METABOLIC PANEL
BUN: 12 mg/dL (ref 6–23)
Calcium: 9.3 mg/dL (ref 8.4–10.5)
GFR calc Af Amer: >90 mL/min (ref >90)
GFR calc non Af Amer: >90 mL/min (ref >90)
Potassium: 3.8 mEq/L (ref 3.5–5.1)
Sodium: 141 mEq/L (ref 135–145)

## 2011-04-07 LAB — CBC
HCT: 41.8 % (ref 39.0–52.0)
MCHC: 34.9 g/dL (ref 30.0–36.0)
RDW: 13.7 % (ref 11.5–15.5)

## 2011-04-07 MED ORDER — NITROGLYCERIN 0.4 MG SL SUBL: 0.4000 mg | SUBLINGUAL_TABLET | SUBLINGUAL | Status: DC | PRN

## 2011-04-07 NOTE — Discharge Summary (Signed)
Alan Wang, Alan Wang                 ACCOUNT NO.:  [id number]  MEDICAL RECORD NO.:  [id number]  LOCATION:  2508                         FACILITY:  [facility]  PHYSICIAN:  Pamella Pert, MD DATE OF BIRTH:  1950-12-10  DATE OF ADMISSION:  04/06/11 DATE OF DISCHARGE:  04/07/2011                              DISCHARGE SUMMARY   DISCHARGE DIAGNOSES: 1. Coronary artery disease of native vessels, status post percutaneous     transluminal coronary angioplasty and stenting of the ostial circumflex and mid circumflex coronary artery with 2.75x22 Integrity stent. H/O ramus     intermedius coronary artery with implantation of a 3.5 x 15-mm drug-     eluting Integrity stent.Promus mid LAD stents 2000. 2. Coronary artery disease with history of percutaneous transluminal     coronary angioplasty and stenting of the mid left anterior     descending artery with implantation of a 3.0 x 23-mm overlapped     with a 2.75 x 13-mm Xience stent that was done in March 2010. 3. Diabetes mellitus type 2, uncontrolled. 4. Hyperlipidemia, mixed. 5. Hypertension. 6. Obesity. 7. History of stroke in 2008 and 2009, presenting with dysarthria. 8. Erectile dysfunction.  RECOMMENDATION:  The patient will be discharged home today.  He will resume his home medications, which include: 1. Ranexa 1000 mg p.o. b.i.d. 2. Plavix 75 mg p.o. daily. 3. Crestor 20 mg p.o. daily. 4. Cymbalta 30 mg p.o. daily. 5. Regular insulin Humulin R on a sliding scale basis. 6. Hyzaar 25/100 1 p.o. q.a.m. 7. Vitamin C 1000 mg p.o. daily. 8. Centrum Silver 1 p.o. daily. 9. Aspirin 81 mg p.o. daily. 10.Niaspan 1000 mg p.o. at bedtime. 11.Coreg 12.5 mg p.o. b.i.d. 12.Metformin ER 500 mg 2 tablets p.o. b.i.d. 13.Fish oil capsules 1012 mg p.o. daily.  The patient will follow up     with me in the outpatient basis in 2 weeks.  BRIEF HISTORY:  [name] is a very pleasant 61 year old gentleman with known history of coronary artery  disease and diabetes who was been having worsening shortness of breath in the last 5-6 months, worsening over the last 1-2 months.  He underwent outpatient stress testing, which had revealed severe inferior and inferolateral wall ischemia.  He was brought to the Cardiac Catheterization Lab on March 08, 2011, and underwent successful angioplasty to his ramus intermedius coronary Artery. Now presented for elective PCI to circumflex coronary artery.    The patient tolerated the procedure well without any complications.  Following morning, his electrolytes were within normal limits with BUN of 12, creatinine of 0.74.  CBC remained stable with a hemoglobin of 14.6 , hematocrit of 41.8 he, white count was 8.6  platelet count was 144000.  His blood sugars remained elevated during the hospitalization around 200.  The patient was felt stable for discharge the following day.  His right Femoral  access site was stable without any hematoma. Physical exam was normal. The EKG also remained stable the following day revealing normal sinus rhythm with poor R-wave progression,  and nonspecific T-wave changes.     Pamella Pert, MD     JRG/MEDQ  D:  03/09/2011  T:  03/09/2011  Job:  [id number]  cc:   Cain Saupe, MD Dorisann Frames, M.D.

## 2011-04-07 NOTE — Progress Notes (Signed)
CARDIAC REHAB PHASE I   PRE:  Rate/Rhythm: 77 SR  BP:  Supine: 160/88  Sitting:   Standing:    SaO2:   MODE:  Ambulation: 680 ft   POST:  Rate/Rhythem: 82 SR  BP:  Supine:   Sitting: 155/82  Standing:    SaO2:  0750-0850  Tolerated ambulation well without c/o of cp or SOB. VS stable. Completed discharge education with pt and wife.He agrees to [employer]. CRP in [place], will send referral.  Alan Wang

## 2011-04-08 LAB — GLUCOSE, CAPILLARY: Glucose-Capillary: 242 mg/dL — ABNORMAL HIGH (ref 70–99)

## 2011-04-08 MED FILL — Dextrose Inj 5%: INTRAVENOUS | Qty: 50 | Status: AC

## 2012-11-06 DIAGNOSIS — Z87442 Personal history of urinary calculi: Secondary | ICD-10-CM

## 2012-11-06 HISTORY — DX: Personal history of urinary calculi: Z87.442

## 2012-11-10 ENCOUNTER — Other Ambulatory Visit: Payer: Self-pay | Admitting: Urology

## 2012-11-16 ENCOUNTER — Encounter: Payer: Self-pay | Admitting: Pulmonary Disease

## 2012-11-16 ENCOUNTER — Ambulatory Visit (INDEPENDENT_AMBULATORY_CARE_PROVIDER_SITE_OTHER)
Admission: RE | Admit: 2012-11-16 | Discharge: 2012-11-16 | Disposition: A | Discharge status: Home / Self Care | Payer: 59 | Source: Ambulatory Visit | Attending: Pulmonary Disease | Admitting: Pulmonary Disease

## 2012-11-16 ENCOUNTER — Ambulatory Visit (INDEPENDENT_AMBULATORY_CARE_PROVIDER_SITE_OTHER): Payer: 59 | Admitting: Pulmonary Disease

## 2012-11-16 VITALS — BP 104/58 | HR 73 | Temp 98.1°F | Ht 70.0 in | Wt 230.2 lb

## 2012-11-16 DIAGNOSIS — R05 Cough: Secondary | ICD-10-CM

## 2012-11-16 DIAGNOSIS — R0602 Shortness of breath: Secondary | ICD-10-CM

## 2012-11-16 DIAGNOSIS — R059 Cough, unspecified: Secondary | ICD-10-CM

## 2012-11-16 MED ORDER — PSEUDOEPHEDRINE HCL 60 MG PO TABS: 60.0000 mg | ORAL_TABLET | Freq: Two times a day (BID) | ORAL | Status: DC

## 2012-11-16 MED ORDER — CHLORPHENIRAMINE MALEATE 4 MG PO TABS: 8.0000 mg | ORAL_TABLET | Freq: Every day | ORAL | Status: DC

## 2012-11-16 MED ORDER — HYDROCODONE-HOMATROPINE 5-1.5 MG/5ML PO SYRP: 5.0000 mL | ORAL_SOLUTION | Freq: Every evening | ORAL | Status: DC | PRN

## 2012-11-16 NOTE — Patient Instructions (Addendum)
Chest xray today Breathing test is [facility]  Your chronic cough may be related to post sinus drip or reflux Suggest -  DELSYm cough syrup 5 ml thrice daily as needed Hydromet cough syrup as needed at bedtime (may make you sleepy) Trial of chlorpheniramine at bedtime & sudafed 60 XL twice daily x 4 weeks

## 2012-11-16 NOTE — Assessment & Plan Note (Addendum)
Chest xray today Breathing test is [facility]  Your chronic cough may be related to post sinus drip or reflux Suggest -  DELSYm cough syrup 5 ml thrice daily as needed Hydromet cough syrup as needed at bedtime (may make you sleepy) Trial of chlorpheniramine at bedtime & sudafed 60 XL twice daily x 4 weeks - we discussed effect on  blood pressure and somnolence

## 2012-11-16 NOTE — Progress Notes (Signed)
Subjective:    Patient ID: Alan Wang, male    DOB: 1950-08-23, 62 y.o.   MRN: [id number]  HPI  62 year old never smoker referred for evaluation of chronic cough. This started as an acute cough in March 2014 after a visit to [place]. He developed sore throat and URI symptoms and his wife was sick a week later. He was treated with antibiotics for maxillary sinusitis in may 2014. He also took Robitussin and Mucinex DM-cough resolved in about 3 months after onset and remained dormant for 3 weeks. He has been coughing again now for the last 4 weeks  Pt also c/o chest congestion and difficulty breathing at times. He denies shortness of breath or wheezing . There is no diurnal variation and no clear triggers. Robitussin provides some sleep through the night. He denies post nasal drip, heartburn. He reports a childhood history of asthma. He has always lived in [place] and denies seasonal allergies or any environmental exposure. Spirometry showed no evidence of airway obstruction with ratio 72 FEV1 of 96% and FVC of 104%. CXR - no infiltrates or effusions He is planning a cruise for 11 days to the [place]  Past Medical History  Diagnosis Date  . Asthma   . Hypertension   . Shortness of breath   . Coronary artery disease   . Stroke     hx TIA  . Arthritis   . Diabetes mellitus     Insulin dependent   Past Surgical History  Procedure Laterality Date  . Hernia repair      x2  . Cardiac catheterization    . Angioplasty  04/06/2011    x3   . Cholecystectomy    . Cataracts      Allergies  Allergen Reactions  . Avandia [Rosiglitazone Maleate] Swelling    History   Social History  . Marital Status: Married    Spouse Name: N/A    Number of Children: 2  . Years of Education: N/A   Occupational History  . retired     Firefighter   Social History Main Topics  . Smoking status: Never Smoker   . Smokeless tobacco: Never Used  . Alcohol Use: Yes     Comment:  occasionally  . Drug Use: No  . Sexual Activity: Yes   Other Topics Concern  . Not on file   Social History Narrative  . No narrative on file    Family History  Problem Relation Age of Onset  . Emphysema Mother   . Asthma Paternal Aunt   . Heart attack Paternal Grandfather   . Heart attack Paternal Grandfather   . Alzheimer's disease Father      Review of Systems  Constitutional: Negative for fever and unexpected weight change.  HENT: Negative for ear pain, nosebleeds, congestion, sore throat, rhinorrhea, sneezing, trouble swallowing, dental problem, postnasal drip and sinus pressure.   Eyes: Negative for redness and itching.  Respiratory: Positive for cough and shortness of breath. Negative for chest tightness and wheezing.   Cardiovascular: Negative for palpitations and leg swelling.  Gastrointestinal: Negative for nausea and vomiting.  Genitourinary: Negative for dysuria.  Musculoskeletal: Negative for joint swelling.  Skin: Negative for rash.  Neurological: Negative for headaches.  Hematological: Does not bruise/bleed easily.  Psychiatric/Behavioral: Negative for dysphoric mood. The patient is not nervous/anxious.        Objective:   Physical Exam  Gen. Pleasant, well-nourished, in no distress, normal affect ENT - no lesions, no  post nasal drip Neck: No JVD, no thyromegaly, no carotid bruits Lungs: no use of accessory muscles, no dullness to percussion, clear without rales or rhonchi  Cardiovascular: Rhythm regular, heart sounds  normal, no murmurs or gallops, no peripheral edema Abdomen: soft and non-tender, no hepatosplenomegaly, BS normal. Musculoskeletal: No deformities, no cyanosis or clubbing Neuro:  alert, non focal        Assessment & Plan:

## 2012-11-20 ENCOUNTER — Encounter (HOSPITAL_COMMUNITY): Payer: Self-pay | Admitting: *Deleted

## 2012-11-20 ENCOUNTER — Encounter (HOSPITAL_COMMUNITY): Payer: Self-pay | Admitting: Pharmacy Technician

## 2012-11-22 NOTE — H&P (Signed)
  Urology History and Physical Exam  CC: Right sided kidney stone  HPI: 62 year old male presents for ESL of an 8 mm left sided proximal ureteral stone. He came into our office earlier this month with hematuria. CT revealed a left UPJ stone, 8 mm in size.  PMH: Past Medical History  Diagnosis Date  . Asthma   . Hypertension   . Shortness of breath   . Coronary artery disease   . Stroke     hx TIA  . Arthritis   . Diabetes mellitus     type 2, insulin dependent for 30+ years  . History of kidney stones 11/2012    PSH: Past Surgical History  Procedure Laterality Date  . Hernia repair      x2  . Angioplasty  04/06/2011    x3   . Cholecystectomy    . Cataracts    . Cardiac catheterization      Allergies: Allergies  Allergen Reactions  . Avandia [Rosiglitazone Maleate] Swelling    Medications: No prescriptions prior to admission     Social History: History   Social History  . Marital Status: Married    Spouse Name: N/A    Number of Children: 2  . Years of Education: N/A   Occupational History  . retired     Firefighter   Social History Main Topics  . Smoking status: Never Smoker   . Smokeless tobacco: Never Used  . Alcohol Use: Yes     Comment: occasionally  . Drug Use: No  . Sexual Activity: Yes   Other Topics Concern  . Not on file   Social History Narrative  . No narrative on file    Family History: Family History  Problem Relation Age of Onset  . Emphysema Mother   . Asthma Paternal Aunt   . Heart attack Paternal Grandfather   . Heart attack Paternal Grandfather   . Alzheimer's disease Father     Review of Systems: Positive: Right flank pain Negative:   A further 10 point review of systems was negative except what is listed in the HPI.  Physical Exam: @VITALS2 @ General: No acute distress.  Awake. Head:  Normocephalic.  Atraumatic. ENT:  EOMI.  Mucous membranes moist Neck:  Supple.  No lymphadenopathy. CV:  S1 present.  S2 present. Regular rate. Pulmonary: Equal effort bilaterally.  Clear to auscultation bilaterally. Abdomen: Soft.  Nontender to palpation. Skin:  Normal turgor.  No visible rash. Extremity: No gross deformity of bilateral upper extremities.  No gross deformity of    bilateral lower extremities. Neurologic: Alert. Appropriate mood.   Studies:  No results found for this basename: HGB, WBC, PLT,  in the last 72 hours  No results found for this basename: NA, K, CL, CO2, BUN, CREATININE, CALCIUM, MAGNESIUM, GFRNONAA, GFRAA,  in the last 72 hours   No results found for this basename: PT, INR, APTT,  in the last 72 hours   No components found with this basename: ABG,     Assessment:  8 mm right proximal ureteral stone  Plan: ESL

## 2012-11-23 ENCOUNTER — Encounter (HOSPITAL_COMMUNITY)
Admission: RE | Disposition: A | Discharge status: Home / Self Care | Payer: Self-pay | Source: Ambulatory Visit | Attending: Urology

## 2012-11-23 ENCOUNTER — Encounter (HOSPITAL_COMMUNITY): Payer: Self-pay | Admitting: *Deleted

## 2012-11-23 ENCOUNTER — Ambulatory Visit (HOSPITAL_COMMUNITY)
Admission: RE | Admit: 2012-11-23 | Discharge: 2012-11-23 | Disposition: A | Discharge status: Home / Self Care | Payer: 59 | Source: Ambulatory Visit | Attending: Urology | Admitting: Urology

## 2012-11-23 ENCOUNTER — Ambulatory Visit (HOSPITAL_COMMUNITY): Payer: 59

## 2012-11-23 DIAGNOSIS — Z8673 Personal history of transient ischemic attack (TIA), and cerebral infarction without residual deficits: Secondary | ICD-10-CM | POA: Insufficient documentation

## 2012-11-23 DIAGNOSIS — Z794 Long term (current) use of insulin: Secondary | ICD-10-CM | POA: Insufficient documentation

## 2012-11-23 DIAGNOSIS — N201 Calculus of ureter: Secondary | ICD-10-CM | POA: Insufficient documentation

## 2012-11-23 DIAGNOSIS — I2510 Atherosclerotic heart disease of native coronary artery without angina pectoris: Secondary | ICD-10-CM | POA: Insufficient documentation

## 2012-11-23 DIAGNOSIS — I10 Essential (primary) hypertension: Secondary | ICD-10-CM | POA: Insufficient documentation

## 2012-11-23 DIAGNOSIS — E119 Type 2 diabetes mellitus without complications: Secondary | ICD-10-CM | POA: Insufficient documentation

## 2012-11-23 HISTORY — DX: Personal history of urinary calculi: Z87.442

## 2012-11-23 SURGERY — LITHOTRIPSY, ESWL
Anesthesia: LOCAL | Laterality: Right

## 2012-11-23 MED ORDER — OXYCODONE HCL 5 MG PO TABS: 5.0000 mg | ORAL_TABLET | ORAL | Status: DC | PRN

## 2012-11-23 MED ORDER — DEXTROSE-NACL 5-0.45 % IV SOLN
INTRAVENOUS | Status: DC
  Administered 2012-11-23: 08:00:00 via INTRAVENOUS

## 2012-11-23 MED ORDER — ACETAMINOPHEN 650 MG RE SUPP
650.0000 mg | RECTAL | Status: DC | PRN
  Filled 2012-11-23: qty 1

## 2012-11-23 MED ORDER — DIPHENHYDRAMINE HCL 25 MG PO CAPS
25.0000 mg | ORAL_CAPSULE | ORAL | Status: AC
  Administered 2012-11-23: 25 mg via ORAL
  Filled 2012-11-23: qty 1

## 2012-11-23 MED ORDER — ONDANSETRON HCL 4 MG/2ML IJ SOLN: 4.0000 mg | Freq: Four times a day (QID) | INTRAMUSCULAR | Status: DC | PRN

## 2012-11-23 MED ORDER — SODIUM CHLORIDE 0.9 % IJ SOLN: 3.0000 mL | Freq: Two times a day (BID) | INTRAMUSCULAR | Status: DC

## 2012-11-23 MED ORDER — ACETAMINOPHEN 325 MG PO TABS: 650.0000 mg | ORAL_TABLET | ORAL | Status: DC | PRN

## 2012-11-23 MED ORDER — SODIUM CHLORIDE 0.9 % IV SOLN: 250.0000 mL | INTRAVENOUS | Status: DC | PRN

## 2012-11-23 MED ORDER — DIAZEPAM 5 MG PO TABS
10.0000 mg | ORAL_TABLET | ORAL | Status: AC
  Administered 2012-11-23: 10 mg via ORAL
  Filled 2012-11-23: qty 2

## 2012-11-23 MED ORDER — SODIUM CHLORIDE 0.9 % IJ SOLN: 3.0000 mL | INTRAMUSCULAR | Status: DC | PRN

## 2012-11-23 MED ORDER — MORPHINE SULFATE 10 MG/ML IJ SOLN: 2.0000 mg | INTRAMUSCULAR | Status: DC | PRN

## 2012-11-23 MED ORDER — SODIUM CHLORIDE 0.45 % IV SOLN
INTRAVENOUS | Status: DC
  Administered 2012-11-23: 08:00:00 via INTRAVENOUS

## 2012-11-23 MED ORDER — LEVOFLOXACIN 500 MG PO TABS
500.0000 mg | ORAL_TABLET | ORAL | Status: AC
  Administered 2012-11-23: 500 mg via ORAL
  Filled 2012-11-23: qty 1

## 2012-11-23 NOTE — Progress Notes (Signed)
Doctor Dahlstedt called re CBG IV fluids changed to 1/2 NS

## 2012-12-13 ENCOUNTER — Encounter: Payer: Self-pay | Admitting: Adult Health

## 2012-12-13 ENCOUNTER — Ambulatory Visit (INDEPENDENT_AMBULATORY_CARE_PROVIDER_SITE_OTHER): Payer: 59 | Admitting: Adult Health

## 2012-12-13 VITALS — BP 106/68 | HR 79 | Temp 97.8°F | Ht 70.0 in | Wt 228.0 lb

## 2012-12-13 DIAGNOSIS — R05 Cough: Secondary | ICD-10-CM

## 2012-12-13 NOTE — Patient Instructions (Signed)
Your chronic cough may be related to post sinus drip or reflux Continue to try to get ahead of cough and prevent triggers.  Suggest -  DELSYm cough syrup 5 ml thrice daily as needed for cough.  Chlorpheniramine at bedtime As needed  For post nasal drip.  Please contact office for sooner follow up if symptoms do not improve or worsen or seek emergency care  follow up Dr. Alva  In 4 months and As needed      

## 2012-12-14 ENCOUNTER — Ambulatory Visit: Payer: 59 | Admitting: Adult Health

## 2012-12-14 NOTE — Progress Notes (Signed)
  Subjective:    Patient ID: Alan Wang, male    DOB: 1950-10-06, 62 y.o.   MRN: [id number]  HPI  62 year old never smoker referred for evaluation of chronic cough. This started as an acute cough in March 2014 after a visit to [place]. He developed sore throat and URI symptoms and his wife was sick a week later. He was treated with antibiotics for maxillary sinusitis in may 2014. He also took Robitussin and Mucinex DM-cough resolved in about 3 months after onset and remained dormant for 3 weeks. He has been coughing again now for the last 4 weeks  Pt also c/o chest congestion and difficulty breathing at times. He denies shortness of breath or wheezing . There is no diurnal variation and no clear triggers. Robitussin provides some sleep through the night. He denies post nasal drip, heartburn. He reports a childhood history of asthma. He has always lived in [place] and denies seasonal allergies or any environmental exposure. Spirometry showed no evidence of airway obstruction with ratio 72 FEV1 of 96% and FVC of 104%. CXR - no infiltrates or effusions He is planning a cruise for 11 days to the [place] >>delsym , clortrimeton rx   12/13/12 4 week follow up Cough -  Reports cough is 60-70% improved since last ov.  recently returned from cruise to [facility].  ran out of the chlopheniramine this past weekend and is still taking the Sudafed. Was seen ne month ago for a primary consult for cough. Patient was felt to have a cyclical cough, possibly secondary to postnasal drip and/or reflux. He was recommended to use Delsym and  chlorphenaramine  Along with Hydromet. Patient does report that he is improved, however, did use, any Delsym. He is also stopped. Chlor-Trimeton and Hydromet for cough. Patient denies any discolored mucus, fever, or chest pain, orthopnea, PND, or leg. Describes. Cough is mainly dry with frequent throat clearing.     Review of Systems  Constitutional: Negative for fever  and unexpected weight change.  HENT: Negative for ear pain, nosebleeds, congestion, sore throat, rhinorrhea, sneezing, trouble swallowing, dental problem,  +postnasal drip  Eyes: Negative for redness and itching.  Respiratory: Positive for cough  Negative for chest tightness and wheezing.   Cardiovascular: Negative for palpitations and leg swelling.  Gastrointestinal: Negative for nausea and vomiting.  Genitourinary: Negative for dysuria.  Musculoskeletal: Negative for joint swelling.  Skin: Negative for rash.  Neurological: Negative for headaches.  Hematological: Does not bruise/bleed easily.  Psychiatric/Behavioral: Negative for dysphoric mood. The patient is not nervous/anxious.        Objective:   Physical Exam  Gen. Pleasant, well-nourished, in no distress, normal affect ENT - no lesions, no post nasal drip Neck: No JVD, no thyromegaly, no carotid bruits Lungs: no use of accessory muscles, no dullness to percussion, clear without rales or rhonchi  Cardiovascular: Rhythm regular, heart sounds  normal, no murmurs or gallops, no peripheral edema Abdomen: soft and non-tender, no hepatosplenomegaly, BS normal. Musculoskeletal: No deformities, no cyanosis or clubbing Neuro:  alert, non focal        Assessment & Plan:

## 2012-12-14 NOTE — Assessment & Plan Note (Signed)
Your chronic cough may be related to post sinus drip or reflux Continue to try to get ahead of cough and prevent triggers.  Suggest -  DELSYm cough syrup 5 ml thrice daily as needed for cough.  Chlorpheniramine at bedtime As needed  For post nasal drip.  Please contact office for sooner follow up if symptoms do not improve or worsen or seek emergency care  follow up Dr. Vassie Loll  In 4 months and As needed

## 2013-01-10 ENCOUNTER — Other Ambulatory Visit: Payer: Self-pay | Admitting: Dermatology

## 2013-09-06 ENCOUNTER — Other Ambulatory Visit: Payer: Self-pay | Admitting: Family

## 2013-09-06 DIAGNOSIS — R42 Dizziness and giddiness: Secondary | ICD-10-CM

## 2013-09-10 ENCOUNTER — Encounter: Payer: Self-pay | Admitting: Diagnostic Neuroimaging

## 2013-09-10 ENCOUNTER — Ambulatory Visit: Payer: 59 | Admitting: Neurology

## 2013-09-10 ENCOUNTER — Ambulatory Visit (INDEPENDENT_AMBULATORY_CARE_PROVIDER_SITE_OTHER): Payer: 59 | Admitting: Diagnostic Neuroimaging

## 2013-09-10 VITALS — BP 116/72 | HR 75 | Ht 70.0 in | Wt 210.0 lb

## 2013-09-10 DIAGNOSIS — R42 Dizziness and giddiness: Secondary | ICD-10-CM

## 2013-09-10 DIAGNOSIS — R269 Unspecified abnormalities of gait and mobility: Secondary | ICD-10-CM

## 2013-09-10 DIAGNOSIS — G450 Vertebro-basilar artery syndrome: Secondary | ICD-10-CM

## 2013-09-10 NOTE — Progress Notes (Signed)
[facility]  PATIENT: Alan Wang DOB: 1950-09-22  REFERRING CLINICIAN: Owens Shark HISTORY FROM: patient and wife  REASON FOR VISIT: new consult   HISTORICAL  CHIEF COMPLAINT:  Chief Complaint  Patient presents with  . Dizziness    HISTORY OF PRESENT ILLNESS:   63 year old right-handed male with intermittent dizziness. Has history of diabetes, TIA, cardiac stents.  For past 6 weeks basis of intermittent dizziness episodes consisting of losing balance, leaning to one side, staggering. Symptoms only occur when he moves from sitting to standing position or if he has been walking or exerting himself. Symptoms do not occur when he is sitting or laying down. No spinning sensation. No falls. No palpitations or sweating. She's had episodes 5-6 weeks. These occur 3-4 days out of the week and up to 2 times per day.  Contribute factors can include physical exertion, decreased fluid intake, GI illness. One episode that was more prolonged occur after he was mowing the lawn outside and began to feel very dizzy/lightheaded.  Patient has checked his blood sugar blood pressure with some of these episodes and has been unremarkable.   REVIEW OF SYSTEMS: Full 14 system review of systems performed and notable only for dizziness increased thirst cough weight loss chills.  ALLERGIES: Allergies  Allergen Reactions  . Avandia [Rosiglitazone Maleate] Swelling    HOME MEDICATIONS: Outpatient Prescriptions Prior to Visit  Medication Sig Dispense Refill  . Ascorbic Acid (VITAMIN C) 1000 MG tablet Take 1,000 mg by mouth daily.       . carvedilol (COREG) 12.5 MG tablet Take 12.5 mg by mouth 2 (two) times daily with a meal.       . DULoxetine (CYMBALTA) 30 MG capsule Take 30 mg by mouth at bedtime.       . insulin NPH (HUMULIN N,NOVOLIN N) 100 UNIT/ML injection Inject 45-75 Units into the skin 2 (two) times daily before a meal. Inject 30 units every morning and inject 55 units every  night.      . insulin regular (NOVOLIN R,HUMULIN R) 100 units/mL injection Inject 30-45 Units into the skin 2 (two) times daily before a meal. Inject 30 units every morning and inject 45 units every night.      . metFORMIN (GLUCOPHAGE-XR) 500 MG 24 hr tablet Take 1,000 mg by mouth 2 (two) times daily.       . methocarbamol (ROBAXIN) 500 MG tablet Take 500 mg by mouth 2 (two) times daily as needed (for muscle spasm).      . naproxen sodium (ANAPROX) 220 MG tablet Take 220 mg by mouth 2 (two) times daily.       . niacin (NIASPAN) 1000 MG CR tablet Take 1,000 mg by mouth at bedtime.       . rosuvastatin (CRESTOR) 20 MG tablet Take 20 mg by mouth at bedtime.       . traMADol (ULTRAM) 50 MG tablet Take 50 mg by mouth every 6 (six) hours as needed for pain.      [name] HYDROcodone-homatropine (HYCODAN) 5-1.5 MG/5ML syrup Take 5 mLs by mouth at bedtime as needed for cough.  120 mL  0  . chlorpheniramine (CHLOR-TRIMETON) 4 MG tablet Take 2 tablets (8 mg total) by mouth at bedtime.  60 tablet  1  . docusate sodium (COLACE) 100 MG capsule Take 100 mg by mouth at bedtime.      [name] HYDROcodone-acetaminophen (NORCO/VICODIN) 5-325 MG per tablet Take 1 tablet by mouth every 6 (six) hours as needed for  pain.      . losartan-hydrochlorothiazide (HYZAAR) 100-25 MG per tablet Take 1 tablet by mouth every morning.       . promethazine (PHENERGAN) 25 MG tablet Take 25-50 mg by mouth every 6 (six) hours as needed for nausea.      . pseudoephedrine (SUDAFED) 60 MG tablet Take 1 tablet (60 mg total) by mouth 2 (two) times daily.  60 tablet  1   No facility-administered medications prior to visit.    PAST MEDICAL HISTORY: Past Medical History  Diagnosis Date  . Asthma   . Hypertension   . Shortness of breath   . Coronary artery disease   . Stroke     hx TIA  . Arthritis   . Diabetes mellitus     type 2, insulin dependent for 30+ years  . History of kidney stones 11/2012  . IDDM (insulin dependent diabetes mellitus)     . Hyperlipemia     PAST SURGICAL HISTORY: Past Surgical History  Procedure Laterality Date  . Hernia repair      x2  . Angioplasty  04/06/2011    x3   . Cholecystectomy    . Cataracts    . Cardiac catheterization      FAMILY HISTORY: Family History  Problem Relation Age of Onset  . Emphysema Mother   . COPD Mother   . Asthma Paternal Aunt   . Heart attack Paternal Grandfather   . Alzheimer's disease Father     SOCIAL HISTORY:  History   Social History  . Marital Status: Married    Spouse Name: Vaughan Basta    Number of Children: 2  . Years of Education: College   Occupational History  . retired     Multimedia programmer   Social History Main Topics  . Smoking status: Never Smoker   . Smokeless tobacco: Never Used  . Alcohol Use: Yes     Comment: occasionally  . Drug Use: No  . Sexual Activity: Yes   Other Topics Concern  . Not on file   Social History Narrative   Patient lives at home with his spouse.   Caffeine Use: occasionally     PHYSICAL EXAM  Filed Vitals:   09/10/13 0820 09/10/13 0823 09/10/13 0824  BP:  120/70 116/72  Pulse:  70 75  Height: 5\' 10"  (1.778 m)    Weight: 210 lb (95.255 kg)      Not recorded    Body mass index is 30.13 kg/(m^2).  GENERAL EXAM: Patient is in no distress; well developed, nourished and groomed; neck is supple; DRY LIPS  CARDIOVASCULAR: Regular rate and rhythm, no murmurs, no carotid bruits  NEUROLOGIC: MENTAL STATUS: awake, alert, oriented to person, place and time, recent and remote memory intact, normal attention and concentration, language fluent, comprehension intact, naming intact, fund of knowledge appropriate CRANIAL NERVE: no papilledema on fundoscopic exam, pupils equal and reactive to light, visual fields full to confrontation, extraocular muscles intact, no nystagmus, facial sensation and strength symmetric, hearing intact, palate elevates symmetrically, uvula midline, shoulder shrug symmetric, tongue  midline. MOTOR: normal bulk and tone, full strength in the BUE, BLE SENSORY: normal and symmetric to light touch, pinprick; DECR TEMP IN FEET. VIB <5 SEC AT TOES. COORDINATION: finger-nose-finger, fine finger movements normal REFLEXES: deep tendon reflexes present and symmetric; 1+ THROUGHOUT. GAIT/STATION: narrow based gait; able to walk on toes, heels; DIFF WITH TANDEM. SLIGHT DIFF WITH QUICK TURNS. Romberg is negative    DIAGNOSTIC DATA (LABS,  IMAGING, TESTING) - I reviewed patient records, labs, notes, testing and imaging myself where available.  Lab Results  Component Value Date   WBC 8.6 04/07/2011   HGB 14.6 04/07/2011   HCT 41.8 04/07/2011   MCV 90.3 04/07/2011   PLT 144* 04/07/2011      Component Value Date/Time   NA 141 04/07/2011 0336   K 3.8 04/07/2011 0336   CL 104 04/07/2011 0336   CO2 30 04/07/2011 0336   GLUCOSE 71 04/07/2011 0336   BUN 12 04/07/2011 0336   CREATININE 0.74 04/07/2011 0336   CALCIUM 9.3 04/07/2011 0336   GFRNONAA >90 04/07/2011 0336   GFRAA >90 04/07/2011 0336   No results found for this basename: CHOL, HDL, LDLCALC, LDLDIRECT, TRIG, CHOLHDL   No results found for this basename: HGBA1C   No results found for this basename: VITAMINB12   No results found for this basename: TSH    ASSESSMENT AND PLAN  63 y.o. year old male here with intermittent "dizzy spells" for past 6 weeks. Patient having balance difficulty, staggering, without vertigo, palpitation or sweating. No numbness or weakness.  Ddx: TIA, dehydration, diabetic neuropathy  PLAN: - TIA workup - increase water intake - log symptoms in diary to track triggering factors  Orders Placed This Encounter  Procedures  . MR Brain Wo Contrast  . MR MRA HEAD WO CONTRAST  . MR Angiogram Neck W Wo Contrast   Return in about 3 months (around 12/11/2013).   Penni Bombard, MD 10/13/1101, 1:59 AM Certified in Neurology, Neurophysiology and Neuroimaging  [facility] [address], [place], [place] [address] [phone]

## 2013-09-10 NOTE — Patient Instructions (Signed)
I will check MRI, MRA scans.  Follow up with PCP and cardiology.

## 2013-09-12 ENCOUNTER — Ambulatory Visit (INDEPENDENT_AMBULATORY_CARE_PROVIDER_SITE_OTHER): Payer: 59

## 2013-09-12 DIAGNOSIS — R269 Unspecified abnormalities of gait and mobility: Secondary | ICD-10-CM

## 2013-09-12 DIAGNOSIS — R42 Dizziness and giddiness: Secondary | ICD-10-CM

## 2013-09-12 DIAGNOSIS — G450 Vertebro-basilar artery syndrome: Secondary | ICD-10-CM

## 2013-09-12 MED ORDER — GADOPENTETATE DIMEGLUMINE 469.01 MG/ML IV SOLN: 20.0000 mL | Freq: Once | INTRAVENOUS | Status: AC | PRN

## 2013-09-13 ENCOUNTER — Telehealth: Payer: Self-pay | Admitting: Diagnostic Neuroimaging

## 2013-09-13 NOTE — Telephone Encounter (Signed)
Please advise previous note. Thanks  °

## 2013-09-13 NOTE — Telephone Encounter (Signed)
I called spouse with MRI, MRA results: unremarkable. Advised follow up with PCP to eval for GI, cardiac or medical causes of dizziness. -VRP

## 2013-09-13 NOTE — Telephone Encounter (Signed)
Spouse called back and stated patient's condition has worsened.  Please return call asap @ [phone].  Thanks

## 2013-09-13 NOTE — Telephone Encounter (Signed)
Patient's wife calling to state that patient had MRI yesterday and today he is very sick, nauseous, and dizzy. Wife Vaughan Basta states that he was not like that yesterday, and is requesting that something be called in for his nausea. Please return call to patient and advise.

## 2013-10-31 ENCOUNTER — Encounter (HOSPITAL_COMMUNITY): Payer: Self-pay | Admitting: Emergency Medicine

## 2013-10-31 ENCOUNTER — Emergency Department (HOSPITAL_COMMUNITY): Payer: 59

## 2013-10-31 ENCOUNTER — Emergency Department (HOSPITAL_COMMUNITY)
Admission: EM | Admit: 2013-10-31 | Discharge: 2013-10-31 | Disposition: A | Discharge status: Home / Self Care | Payer: 59 | Attending: Emergency Medicine | Admitting: Emergency Medicine

## 2013-10-31 DIAGNOSIS — Z9889 Other specified postprocedural states: Secondary | ICD-10-CM | POA: Insufficient documentation

## 2013-10-31 DIAGNOSIS — Z7902 Long term (current) use of antithrombotics/antiplatelets: Secondary | ICD-10-CM | POA: Insufficient documentation

## 2013-10-31 DIAGNOSIS — R112 Nausea with vomiting, unspecified: Secondary | ICD-10-CM | POA: Insufficient documentation

## 2013-10-31 DIAGNOSIS — Z8673 Personal history of transient ischemic attack (TIA), and cerebral infarction without residual deficits: Secondary | ICD-10-CM | POA: Insufficient documentation

## 2013-10-31 DIAGNOSIS — I10 Essential (primary) hypertension: Secondary | ICD-10-CM | POA: Diagnosis not present

## 2013-10-31 DIAGNOSIS — J45909 Unspecified asthma, uncomplicated: Secondary | ICD-10-CM | POA: Insufficient documentation

## 2013-10-31 DIAGNOSIS — Z9849 Cataract extraction status, unspecified eye: Secondary | ICD-10-CM | POA: Diagnosis not present

## 2013-10-31 DIAGNOSIS — E785 Hyperlipidemia, unspecified: Secondary | ICD-10-CM | POA: Insufficient documentation

## 2013-10-31 DIAGNOSIS — R42 Dizziness and giddiness: Secondary | ICD-10-CM

## 2013-10-31 DIAGNOSIS — Z87442 Personal history of urinary calculi: Secondary | ICD-10-CM | POA: Insufficient documentation

## 2013-10-31 DIAGNOSIS — I2510 Atherosclerotic heart disease of native coronary artery without angina pectoris: Secondary | ICD-10-CM | POA: Diagnosis not present

## 2013-10-31 DIAGNOSIS — Z794 Long term (current) use of insulin: Secondary | ICD-10-CM | POA: Insufficient documentation

## 2013-10-31 DIAGNOSIS — Z7982 Long term (current) use of aspirin: Secondary | ICD-10-CM | POA: Diagnosis not present

## 2013-10-31 DIAGNOSIS — M129 Arthropathy, unspecified: Secondary | ICD-10-CM | POA: Insufficient documentation

## 2013-10-31 DIAGNOSIS — E119 Type 2 diabetes mellitus without complications: Secondary | ICD-10-CM | POA: Insufficient documentation

## 2013-10-31 DIAGNOSIS — Z79899 Other long term (current) drug therapy: Secondary | ICD-10-CM | POA: Insufficient documentation

## 2013-10-31 DIAGNOSIS — Z791 Long term (current) use of non-steroidal anti-inflammatories (NSAID): Secondary | ICD-10-CM | POA: Insufficient documentation

## 2013-10-31 LAB — CBC
HCT: 45.9 % (ref 39.0–52.0)
Hemoglobin: 15.4 g/dL (ref 13.0–17.0)
MCH: 30.3 pg (ref 26.0–34.0)
MCHC: 33.6 g/dL (ref 30.0–36.0)
MCV: 90.4 fL (ref 78.0–100.0)
Platelets: 137 10*3/uL — ABNORMAL LOW (ref 150–400)
RBC: 5.08 MIL/uL (ref 4.22–5.81)
RDW: 14.5 % (ref 11.5–15.5)
WBC: 9.1 10*3/uL (ref 4.0–10.5)

## 2013-10-31 LAB — URINALYSIS, ROUTINE W REFLEX MICROSCOPIC
Bilirubin Urine: NEGATIVE
Ketones, ur: 15 mg/dL — AB
Leukocytes, UA: NEGATIVE
Nitrite: NEGATIVE
PH: 5 (ref 5.0–8.0)
Protein, ur: NEGATIVE mg/dL
Specific Gravity, Urine: 1.042 — ABNORMAL HIGH (ref 1.005–1.030)
Urobilinogen, UA: 0.2 mg/dL (ref 0.0–1.0)

## 2013-10-31 LAB — DIFFERENTIAL
BASOS PCT: 1 % (ref 0–1)
Basophils Absolute: 0.1 10*3/uL (ref 0.0–0.1)
EOS ABS: 0.1 10*3/uL (ref 0.0–0.7)
Eosinophils Relative: 1 % (ref 0–5)
LYMPHS ABS: 2 10*3/uL (ref 0.7–4.0)
Lymphocytes Relative: 22 % (ref 12–46)
Monocytes Absolute: 0.8 10*3/uL (ref 0.1–1.0)
Monocytes Relative: 8 % (ref 3–12)
NEUTROS PCT: 68 % (ref 43–77)
Neutro Abs: 6.2 10*3/uL (ref 1.7–7.7)

## 2013-10-31 LAB — COMPREHENSIVE METABOLIC PANEL
ALBUMIN: 3.3 g/dL — AB (ref 3.5–5.2)
ALK PHOS: 103 U/L (ref 39–117)
ALT: 16 U/L (ref 0–53)
AST: 26 U/L (ref 0–37)
Anion gap: 13 (ref 5–15)
BUN: 10 mg/dL (ref 6–23)
CO2: 23 mEq/L (ref 19–32)
Calcium: 9 mg/dL (ref 8.4–10.5)
Chloride: 107 mEq/L (ref 96–112)
Creatinine, Ser: 0.68 mg/dL (ref 0.50–1.35)
GFR calc Af Amer: >90 mL/min (ref >90)
GFR calc non Af Amer: >90 mL/min (ref >90)
GLUCOSE: 175 mg/dL — AB (ref 70–99)
POTASSIUM: 4.6 meq/L (ref 3.7–5.3)
Sodium: 143 mEq/L (ref 137–147)
TOTAL PROTEIN: 6.7 g/dL (ref 6.0–8.3)
Total Bilirubin: 0.3 mg/dL (ref 0.3–1.2)

## 2013-10-31 LAB — I-STAT TROPONIN, ED: Troponin i, poc: 0.01 ng/mL (ref 0.00–0.08)

## 2013-10-31 LAB — PROTIME-INR
INR: 1.01 (ref 0.00–1.49)
PROTHROMBIN TIME: 13.3 s (ref 11.6–15.2)

## 2013-10-31 LAB — APTT: aPTT: 25 seconds (ref 24–37)

## 2013-10-31 LAB — URINE MICROSCOPIC-ADD ON

## 2013-10-31 LAB — CBG MONITORING, ED: Glucose-Capillary: 149 mg/dL — ABNORMAL HIGH (ref 70–99)

## 2013-10-31 MED ORDER — MECLIZINE HCL 25 MG PO TABS: 25.0000 mg | ORAL_TABLET | Freq: Three times a day (TID) | ORAL | Status: DC | PRN

## 2013-10-31 MED ORDER — ONDANSETRON HCL 4 MG/2ML IJ SOLN
4.0000 mg | Freq: Once | INTRAMUSCULAR | Status: AC
  Administered 2013-10-31: 4 mg via INTRAVENOUS
  Filled 2013-10-31: qty 2

## 2013-10-31 MED ORDER — MECLIZINE HCL 25 MG PO TABS
25.0000 mg | ORAL_TABLET | Freq: Once | ORAL | Status: AC
  Administered 2013-10-31: 25 mg via ORAL
  Filled 2013-10-31: qty 1

## 2013-10-31 MED ORDER — DIAZEPAM 5 MG/ML IJ SOLN
5.0000 mg | Freq: Once | INTRAMUSCULAR | Status: AC
  Administered 2013-10-31: 5 mg via INTRAVENOUS
  Filled 2013-10-31: qty 2

## 2013-10-31 MED ORDER — ONDANSETRON 4 MG PO TBDP: 4.0000 mg | ORAL_TABLET | Freq: Three times a day (TID) | ORAL | Status: DC | PRN

## 2013-10-31 NOTE — ED Provider Notes (Signed)
CSN: [id number]     Arrival date & time 10/31/13  1157 History   First MD Initiated Contact with Patient 10/31/13 1220     Chief Complaint  Patient presents with  . Dizziness  . Emesis     (Consider location/radiation/quality/duration/timing/severity/associated sxs/prior Treatment) HPI 63 year old male presents with acute dizziness, nausea, and vomiting. Patient states that he had a similar episode to this about 6 weeks ago. He saw a neurologist and had an MRI/MRA of his head and neck that was normal. He did not have any symptoms by the time he saw the neurologist. Patient states is due to follow up with an ENT but this resolved. The history is difficult but it seems that he has not had any other significant symptoms besides intermittent vague dizziness until today. He woke up normal, went to breakfast and on his way home started feeling nausea and then has had severe dizziness with any type of sitting or standing. He is unable to walk due to the dizziness. He states the room is not spinning but it feels like he is leaning 1 where another when he stands up. His wife endorses he is looking like his to fall to the right. Denies CP/SOB. Vomited while in CT scanner. Nursing called a code stroke as patient came in with symptoms less than 3 hours and states this was just like previous CVA.  Past Medical History  Diagnosis Date  . Asthma   . Hypertension   . Shortness of breath   . Coronary artery disease   . Stroke     hx TIA  . Arthritis   . Diabetes mellitus     type 2, insulin dependent for 30+ years  . History of kidney stones 11/2012  . IDDM (insulin dependent diabetes mellitus)   . Hyperlipemia    Past Surgical History  Procedure Laterality Date  . Hernia repair      x2  . Angioplasty  04/06/2011    x3   . Cholecystectomy    . Cataracts    . Cardiac catheterization     Family History  Problem Relation Age of Onset  . Emphysema Mother   . COPD Mother   . Asthma Paternal Aunt    . Heart attack Paternal Grandfather   . Alzheimer's disease Father    History  Substance Use Topics  . Smoking status: Never Smoker   . Smokeless tobacco: Never Used  . Alcohol Use: Yes     Comment: occasionally    Review of Systems  Constitutional: Negative for fever.  Respiratory: Negative for shortness of breath.   Cardiovascular: Negative for chest pain.  Gastrointestinal: Positive for nausea and vomiting.  Musculoskeletal: Positive for gait problem.  Neurological: Positive for dizziness. Negative for weakness and numbness.  All other systems reviewed and are negative.     Allergies  Avandia  Home Medications   Prior to Admission medications   Medication Sig Start Date End Date Taking? Authorizing Provider  Ascorbic Acid (VITAMIN C) 1000 MG tablet Take 1,000 mg by mouth daily.     Historical Provider, MD  aspirin 81 MG tablet Take 81 mg by mouth daily.    Historical Provider, MD  Canagliflozin (INVOKANA) 100 MG TABS Take 1 tablet by mouth daily.    Historical Provider, MD  carvedilol (COREG) 12.5 MG tablet Take 12.5 mg by mouth 2 (two) times daily with a meal.     Historical Provider, MD  clopidogrel (PLAVIX) 75 MG tablet Take 75 mg  by mouth daily.    Historical Provider, MD  DULoxetine (CYMBALTA) 30 MG capsule Take 30 mg by mouth at bedtime.     Historical Provider, MD  guaiFENesin (MUCINEX) 600 MG 12 hr tablet Take by mouth 2 (two) times daily as needed.    Historical Provider, MD  HYDROcodone-homatropine (HYCODAN) 5-1.5 MG/5ML syrup Take 5 mLs by mouth at bedtime as needed for cough. 11/16/12   Rigoberto Noel, MD  insulin NPH (HUMULIN N,NOVOLIN N) 100 UNIT/ML injection Inject 45-75 Units into the skin 2 (two) times daily before a meal. Inject 30 units every morning and inject 55 units every night.    Historical Provider, MD  insulin regular (NOVOLIN R,HUMULIN R) 100 units/mL injection Inject 30-45 Units into the skin 2 (two) times daily before a meal. Inject 30 units  every morning and inject 45 units every night.    Historical Provider, MD  losartan (COZAAR) 50 MG tablet Take 50 mg by mouth daily.    Historical Provider, MD  metFORMIN (GLUCOPHAGE-XR) 500 MG 24 hr tablet Take 1,000 mg by mouth 2 (two) times daily.  03/10/11   Laverda Page, MD  methocarbamol (ROBAXIN) 500 MG tablet Take 500 mg by mouth 2 (two) times daily as needed (for muscle spasm).    Historical Provider, MD  naproxen sodium (ANAPROX) 220 MG tablet Take 220 mg by mouth 2 (two) times daily.     Historical Provider, MD  niacin (NIASPAN) 1000 MG CR tablet Take 1,000 mg by mouth at bedtime.     Historical Provider, MD  rosuvastatin (CRESTOR) 20 MG tablet Take 20 mg by mouth at bedtime.     Historical Provider, MD  traMADol (ULTRAM) 50 MG tablet Take 50 mg by mouth every 6 (six) hours as needed for pain.    Historical Provider, MD   BP 143/84  Pulse 66  Temp(Src) 97.5 F (36.4 C) (Oral)  Resp 11  Ht 5' 10.5" (1.791 m)  Wt 205 lb (92.987 kg)  BMI 28.99 kg/m2  SpO2 98% Physical Exam  Nursing note and vitals reviewed. Constitutional: He is oriented to person, place, and time. He appears well-developed and well-nourished.  HENT:  Head: Normocephalic and atraumatic.  Right Ear: External ear normal.  Left Ear: External ear normal.  Nose: Nose normal.  Eyes: EOM are normal. Pupils are equal, round, and reactive to light. Right eye exhibits no discharge. Left eye exhibits no discharge.  No nystagmus  Neck: Neck supple.  Cardiovascular: Normal rate, regular rhythm, normal heart sounds and intact distal pulses.   Pulmonary/Chest: Effort normal and breath sounds normal.  Abdominal: Soft. There is no tenderness.  Musculoskeletal: He exhibits no edema.  Neurological: He is alert and oriented to person, place, and time.  CN 2-12 grossly intact. 5/5 strength in all 4 extremities. Normal gross sensation. Normal finger to nose  Skin: Skin is warm and dry.    ED Course  Procedures (including  critical care time) Labs Review Labs Reviewed  CBC - Abnormal; Notable for the following:    Platelets 137 (*)    All other components within normal limits  COMPREHENSIVE METABOLIC PANEL - Abnormal; Notable for the following:    Glucose, Bld 175 (*)    Albumin 3.3 (*)    All other components within normal limits  URINALYSIS, ROUTINE W REFLEX MICROSCOPIC - Abnormal; Notable for the following:    Specific Gravity, Urine 1.042 (*)    Glucose, UA >1000 (*)    Hgb urine dipstick  SMALL (*)    Ketones, ur 15 (*)    All other components within normal limits  CBG MONITORING, ED - Abnormal; Notable for the following:    Glucose-Capillary 149 (*)    All other components within normal limits  PROTIME-INR  APTT  DIFFERENTIAL  URINE MICROSCOPIC-ADD ON  Randolm Idol, ED    Imaging Review Ct Head (brain) Wo Contrast  10/31/2013   CLINICAL DATA:  63 year old male code stroke with sudden onset dizziness at 0930 hrs. Emesis. Initial encounter.  EXAM: CT HEAD WITHOUT CONTRAST  TECHNIQUE: Contiguous axial images were obtained from the base of the skull through the vertex without intravenous contrast.  COMPARISON:  Brain MRI 01/19/2005.  Head CT 07/06/2004.  FINDINGS: Visualized paranasal sinuses and mastoids are clear. No acute osseous abnormality identified. Postoperative changes to the globes. Visualized scalp soft tissues are within normal limits.  Calcified atherosclerosis at the skull base. Cerebral volume is not significantly changed. No ventriculomegaly. No midline shift, mass effect, or evidence of intracranial mass lesion. No suspicious intracranial vascular hyperdensity.  No evidence of cortically based acute infarction identified. Gray-white matter differentiation in the posterior fossa appears stable and within normal limits.  IMPRESSION: Stable and normal for age non contrast CT appearance of the brain.  Study discussed by telephone with Dr. Alexis Goodell at 12:33 pm on 10/31/2013.    Electronically Signed   By: Lars Pinks M.D.   On: 10/31/2013 12:33     EKG Interpretation   Date/Time:  Wednesday October 31 2013 12:02:03 EDT Ventricular Rate:  66 PR Interval:  164 QRS Duration: 80 QT Interval:  416 QTC Calculation: 436 R Axis:   38 Text Interpretation:  Normal sinus rhythm Septal infarct , age  undetermined Abnormal ECG No significant change since last tracing  Confirmed by [place] ([id number]) on 10/31/2013 12:27:22 PM      MDM   Final diagnoses:  Vertigo    Patient was called a code stroke by nursing staff and triage. Upon arrival to the ED room his parents having intermittent vertigo. After discussion with neurology the patient does not require repeat MRI given he had symptoms very similar to this 6 weeks ago with a negative MRI/MRA. Symptoms mostly controlled with Antivert and Zofran. Was able to get up and walk. After a short distance of walking normally he did start to re\re experiences vertigo. Due to this he is also given IV Valium. I discussed with patient he prefers to go home and rest as opposed pain in the hospital as his symptoms are controlled he is sitting or laying still. Discussed strict return precautions. This appears to be a peripheral vertigo. Will get ENT followup as he was previously supposed to do.    Ephraim Hamburger, MD 10/31/13 1556

## 2013-10-31 NOTE — ED Notes (Signed)
CBG 149. 

## 2013-10-31 NOTE — ED Notes (Signed)
Code Stroke canceled per Md Reynolds.

## 2013-10-31 NOTE — ED Notes (Signed)
Pt denies dizziness at this time. Reports mild nausea. Pt AO x4. Neuro intact.

## 2013-10-31 NOTE — Code Documentation (Signed)
63yo male arriving to [facility] via private vehicle at 1157.  Patient reports that he was starting to mow the lawn when he started to feel "queasy" to his stomach then dizziness with nausea and vomiting.  He reports feeling off balance.  Code stroke called.  Patient taken to CT.  NIHSS 0 on arrival, no neuro deficits on exam.  Patient reports that he has had similar episodes in the past, but today is worse.  He has reportedly had the symptoms on and off for six weeks and saw a neurologist outpatient where he had MRI that showed no acute findings.  No acute stroke intervention at this time. Code stroke canceled at 45 by Dr. Doy Mince.  Bedside handoff with ED RN [place].

## 2013-10-31 NOTE — ED Notes (Signed)
Pt felt dizzy while walking, pt stated that he feels fine standing still, but while moving he is dizzy.

## 2013-10-31 NOTE — ED Notes (Signed)
Md Reynolds at bedside.

## 2013-10-31 NOTE — Discharge Instructions (Signed)
Dizziness °Dizziness is a common problem. It is a feeling of unsteadiness or light-headedness. You may feel like you are about to faint. Dizziness can lead to injury if you stumble or fall. A person of any age group can suffer from dizziness, but dizziness is more common in older adults. °CAUSES  °Dizziness can be caused by many different things, including: °· Middle ear problems. °· Standing for too long. °· Infections. °· An allergic reaction. °· Aging. °· An emotional response to something, such as the sight of blood. °· Side effects of medicines. °· Tiredness. °· Problems with circulation or blood pressure. °· Excessive use of alcohol or medicines, or illegal drug use. °· Breathing too fast (hyperventilation). °· An irregular heart rhythm (arrhythmia). °· A low red blood cell count (anemia). °· Pregnancy. °· Vomiting, diarrhea, fever, or other illnesses that cause body fluid loss (dehydration). °· Diseases or conditions such as Parkinson's disease, high blood pressure (hypertension), diabetes, and thyroid problems. °· Exposure to extreme heat. °DIAGNOSIS  °Your health care provider will ask about your symptoms, perform a physical exam, and perform an electrocardiogram (ECG) to record the electrical activity of your heart. Your health care provider may also perform other heart or blood tests to determine the cause of your dizziness. These may include: °· Transthoracic echocardiogram (TTE). During echocardiography, sound waves are used to evaluate how blood flows through your heart. °· Transesophageal echocardiogram (TEE). °· Cardiac monitoring. This allows your health care provider to monitor your heart rate and rhythm in real time. °· Holter monitor. This is a portable device that records your heartbeat and can help diagnose heart arrhythmias. It allows your health care provider to track your heart activity for several days if needed. °· Stress tests by exercise or by giving medicine that makes the heart beat  faster. °TREATMENT  °Treatment of dizziness depends on the cause of your symptoms and can vary greatly. °HOME CARE INSTRUCTIONS  °· Drink enough fluids to keep your urine clear or pale yellow. This is especially important in very hot weather. In older adults, it is also important in cold weather. °· Take your medicine exactly as directed if your dizziness is caused by medicines. When taking blood pressure medicines, it is especially important to get up slowly. °· Rise slowly from chairs and steady yourself until you feel okay. °· In the morning, first sit up on the side of the bed. When you feel okay, stand slowly while holding onto something until you know your balance is fine. °· Move your legs often if you need to stand in one place for a long time. Tighten and relax your muscles in your legs while standing. °· Have someone stay with you for 1-2 days if dizziness continues to be a problem. Do this until you feel you are well enough to stay alone. Have the person call your health care provider if he or she notices changes in you that are concerning. °· Do not drive or use heavy machinery if you feel dizzy. °· Do not drink alcohol. °SEEK IMMEDIATE MEDICAL CARE IF:  °· Your dizziness or light-headedness gets worse. °· You feel nauseous or vomit. °· You have problems talking, walking, or using your arms, hands, or legs. °· You feel weak. °· You are not thinking clearly or you have trouble forming sentences. It may take a friend or family member to notice this. °· You have chest pain, abdominal pain, shortness of breath, or sweating. °· Your vision changes. °· You notice   any bleeding. °· You have side effects from medicine that seems to be getting worse rather than better. °MAKE SURE YOU:  °· Understand these instructions. °· Will watch your condition. °· Will get help right away if you are not doing well or get worse. °Document Released: 08/18/2000 Document Revised: 02/27/2013 Document Reviewed: 09/11/2010 °[employer]®  Patient Information ©2015 [employer]. This information is not intended to replace advice given to you by your health care provider. Make sure you discuss any questions you have with your health care provider. ° ° ° °Vertigo °Vertigo means you feel like you or your surroundings are moving when they are not. Vertigo can be dangerous if it occurs when you are at work, driving, or performing difficult activities.  °CAUSES  °Vertigo occurs when there is a conflict of signals sent to your brain from the visual and sensory systems in your body. There are many different causes of vertigo, including: °· Infections, especially in the inner ear. °· A bad reaction to a drug or misuse of alcohol and medicines. °· Withdrawal from drugs or alcohol. °· Rapidly changing positions, such as lying down or rolling over in bed. °· A migraine headache. °· Decreased blood flow to the brain. °· Increased pressure in the brain from a head injury, infection, tumor, or bleeding. °SYMPTOMS  °You may feel as though the world is spinning around or you are falling to the ground. Because your balance is upset, vertigo can cause nausea and vomiting. You may have involuntary eye movements (nystagmus). °DIAGNOSIS  °Vertigo is usually diagnosed by physical exam. If the cause of your vertigo is unknown, your caregiver may perform imaging tests, such as an MRI scan (magnetic resonance imaging). °TREATMENT  °Most cases of vertigo resolve on their own, without treatment. Depending on the cause, your caregiver may prescribe certain medicines. If your vertigo is related to body position issues, your caregiver may recommend movements or procedures to correct the problem. In rare cases, if your vertigo is caused by certain inner ear problems, you may need surgery. °HOME CARE INSTRUCTIONS  °· Follow your caregiver's instructions. °· Avoid driving. °· Avoid operating heavy machinery. °· Avoid performing any tasks that would be dangerous to you or others  during a vertigo episode. °· Tell your caregiver if you notice that certain medicines seem to be causing your vertigo. Some of the medicines used to treat vertigo episodes can actually make them worse in some people. °SEEK IMMEDIATE MEDICAL CARE IF:  °· Your medicines do not relieve your vertigo or are making it worse. °· You develop problems with talking, walking, weakness, or using your arms, hands, or legs. °· You develop severe headaches. °· Your nausea or vomiting continues or gets worse. °· You develop visual changes. °· A family member notices behavioral changes. °· Your condition gets worse. °MAKE SURE YOU: °· Understand these instructions. °· Will watch your condition. °· Will get help right away if you are not doing well or get worse. °Document Released: 12/02/2004 Document Revised: 05/17/2011 Document Reviewed: 09/10/2010 °[employer]® Patient Information ©2015 [employer]. This information is not intended to replace advice given to you by your health care provider. Make sure you discuss any questions you have with your health care provider. ° °

## 2013-10-31 NOTE — Consult Note (Signed)
Referring Physician: Regenia Skeeter    Chief Complaint: dizziness  HPI:                                                                                                                                         Alan Wang is an 63 y.o. male who was recently seen by Dr. Leta Baptist on 09/10/2013 for similar symptoms of N/V and dizziness. At that time he obtained a MRI brain along with MRA brain and neck, all of which were negative. He was to follow up with ENT but due to feeling better he did not go. Today patient awoke feeling normal.  At 0930 he started to feel "Alan Wang" to his stomach then noted he felt dizzy.  This evolved into nausea and vomiting. He was brought to ED for these symptoms.  While in ED Code stroke was called.  Currently he feels fine as long as he is not moving but if moves his head he feels dizzy.    Date last known well: Date: 10/31/2013 Time last known well: Time: 09:30 tPA Given: No: minimal symptoms and NIHSS 0  Past Medical History  Diagnosis Date  . Asthma   . Hypertension   . Shortness of breath   . Coronary artery disease   . Stroke     hx TIA  . Arthritis   . Diabetes mellitus     type 2, insulin dependent for 30+ years  . History of kidney stones 11/2012  . IDDM (insulin dependent diabetes mellitus)   . Hyperlipemia     Past Surgical History  Procedure Laterality Date  . Hernia repair      x2  . Angioplasty  04/06/2011    x3   . Cholecystectomy    . Cataracts    . Cardiac catheterization      Family History  Problem Relation Age of Onset  . Emphysema Mother   . COPD Mother   . Asthma Paternal Aunt   . Heart attack Paternal Grandfather   . Alzheimer's disease Father    Social History:  reports that he has never smoked. He has never used smokeless tobacco. He reports that he drinks alcohol. He reports that he does not use illicit drugs.  Allergies:  Allergies  Allergen Reactions  . Avandia [Rosiglitazone Maleate] Swelling    Medications:  Current Facility-Administered Medications  Medication Dose Route Frequency Provider Last Rate Last Dose  . meclizine (ANTIVERT) tablet 25 mg  25 mg Oral Once Ephraim Hamburger, MD      . ondansetron [facility]) injection 4 mg  4 mg Intravenous Once Ephraim Hamburger, MD       Current Outpatient Prescriptions  Medication Sig Dispense Refill  . Ascorbic Acid (VITAMIN C) 1000 MG tablet Take 1,000 mg by mouth daily.       [name] aspirin 81 MG tablet Take 81 mg by mouth daily.      . Canagliflozin (INVOKANA) 100 MG TABS Take 1 tablet by mouth daily.      . carvedilol (COREG) 12.5 MG tablet Take 12.5 mg by mouth 2 (two) times daily with a meal.       . clopidogrel (PLAVIX) 75 MG tablet Take 75 mg by mouth daily.      . DULoxetine (CYMBALTA) 30 MG capsule Take 30 mg by mouth at bedtime.       [name] guaiFENesin (MUCINEX) 600 MG 12 hr tablet Take by mouth 2 (two) times daily as needed.      [name] HYDROcodone-homatropine (HYCODAN) 5-1.5 MG/5ML syrup Take 5 mLs by mouth at bedtime as needed for cough.  120 mL  0  . insulin NPH (HUMULIN N,NOVOLIN N) 100 UNIT/ML injection Inject 45-75 Units into the skin 2 (two) times daily before a meal. Inject 30 units every morning and inject 55 units every night.      . insulin regular (NOVOLIN R,HUMULIN R) 100 units/mL injection Inject 30-45 Units into the skin 2 (two) times daily before a meal. Inject 30 units every morning and inject 45 units every night.      . losartan (COZAAR) 50 MG tablet Take 50 mg by mouth daily.      . metFORMIN (GLUCOPHAGE-XR) 500 MG 24 hr tablet Take 1,000 mg by mouth 2 (two) times daily.       . methocarbamol (ROBAXIN) 500 MG tablet Take 500 mg by mouth 2 (two) times daily as needed (for muscle spasm).      . naproxen sodium (ANAPROX) 220 MG tablet Take 220 mg by mouth 2 (two) times daily.       . niacin (NIASPAN) 1000 MG CR tablet Take 1,000 mg by  mouth at bedtime.       . rosuvastatin (CRESTOR) 20 MG tablet Take 20 mg by mouth at bedtime.       . traMADol (ULTRAM) 50 MG tablet Take 50 mg by mouth every 6 (six) hours as needed for pain.         ROS:                                                                                                                                       History obtained from the patient  General ROS: negative for - chills, fatigue,  fever, night sweats, weight gain or weight loss Psychological ROS: negative for - behavioral disorder, hallucinations, memory difficulties, mood swings or suicidal ideation Ophthalmic ROS: negative for - blurry vision, double vision, eye pain or loss of vision ENT ROS: negative for - epistaxis, nasal discharge, oral lesions, sore throat, tinnitus or vertigo Allergy and Immunology ROS: negative for - hives or itchy/watery eyes Hematological and Lymphatic ROS: negative for - bleeding problems, bruising or swollen lymph nodes Endocrine ROS: negative for - galactorrhea, hair pattern changes, polydipsia/polyuria or temperature intolerance Respiratory ROS: negative for - cough, hemoptysis, shortness of breath or wheezing Cardiovascular ROS: negative for - chest pain, dyspnea on exertion, edema or irregular heartbeat Gastrointestinal ROS: negative for - abdominal pain, diarrhea, hematemesis, nausea/vomiting or stool incontinence Genito-Urinary ROS: negative for - dysuria, hematuria, incontinence or urinary frequency/urgency Musculoskeletal ROS: negative for - joint swelling or muscular weakness Neurological ROS: as noted in HPI Dermatological ROS: negative for rash and skin lesion changes  Neurologic Examination:                                                                                                      Blood pressure 143/84, pulse 66, temperature 97.5 F (36.4 C), temperature source Oral, resp. rate 11, height 5' 10.5" (1.791 m), weight 92.987 kg (205 lb), SpO2  98.00%.    Mental Status: Alert, oriented, thought content appropriate.  Speech fluent without evidence of aphasia.  Able to follow 3 step commands without difficulty. Cranial Nerves: II: Discs flat bilaterally; Visual fields grossly normal, pupils equal, round, reactive to light and accommodation III,IV, VI: ptosis not present, extra-ocular motions intact bilaterally V,VII: smile symmetric, facial light touch sensation normal bilaterally VIII: hearing normal bilaterally IX,X: gag reflex present XI: bilateral shoulder shrug XII: midline tongue extension without atrophy or fasciculations  Motor: Right : Upper extremity   5/5    Left:     Upper extremity   5/5  Lower extremity   5/5     Lower extremity   5/5 Tone and bulk:normal tone throughout; no atrophy noted Sensory: Pinprick and light touch intact throughout, bilaterally Deep Tendon Reflexes:  Right: Upper Extremity   Left: Upper extremity   biceps (C-5 to C-6) 2/4   biceps (C-5 to C-6) 2/4 tricep (C7) 2/4    triceps (C7) 2/4 Brachioradialis (C6) 2/4  Brachioradialis (C6) 2/4  Lower Extremity Lower Extremity  quadriceps (L-2 to L-4) 2/4   quadriceps (L-2 to L-4) 2/4 Achilles (S1) 2/4   Achilles (S1) 2/4  Plantars: Right: downgoing   Left: downgoing Cerebellar: normal finger-to-nose,  normal heel-to-shin test Gait: not tested due to multiple leads CV: pulses palpable throughout    Lab Results: Basic Metabolic Panel: No results found for this basename: NA, K, CL, CO2, GLUCOSE, BUN, CREATININE, CALCIUM, MG, PHOS,  in the last 168 hours  Liver Function Tests: No results found for this basename: AST, ALT, ALKPHOS, BILITOT, PROT, ALBUMIN,  in the last 168 hours No results found for this basename: LIPASE, AMYLASE,  in the last 168 hours No results found for this basename:  AMMONIA,  in the last 168 hours  CBC: No results found for this basename: WBC, NEUTROABS, HGB, HCT, MCV, PLT,  in the last 168 hours  Cardiac  Enzymes: No results found for this basename: CKTOTAL, CKMB, CKMBINDEX, TROPONINI,  in the last 168 hours  Lipid Panel: No results found for this basename: CHOL, TRIG, HDL, CHOLHDL, VLDL, LDLCALC,  in the last 168 hours  CBG: No results found for this basename: GLUCAP,  in the last 168 hours  Microbiology: No results found for this or any previous visit.  Coagulation Studies: No results found for this basename: LABPROT, INR,  in the last 72 hours  Imaging: Ct Head (brain) Wo Contrast  10/31/2013   CLINICAL DATA:  63 year old male code stroke with sudden onset dizziness at 0930 hrs. Emesis. Initial encounter.  EXAM: CT HEAD WITHOUT CONTRAST  TECHNIQUE: Contiguous axial images were obtained from the base of the skull through the vertex without intravenous contrast.  COMPARISON:  Brain MRI 01/19/2005.  Head CT 07/06/2004.  FINDINGS: Visualized paranasal sinuses and mastoids are clear. No acute osseous abnormality identified. Postoperative changes to the globes. Visualized scalp soft tissues are within normal limits.  Calcified atherosclerosis at the skull base. Cerebral volume is not significantly changed. No ventriculomegaly. No midline shift, mass effect, or evidence of intracranial mass lesion. No suspicious intracranial vascular hyperdensity.  No evidence of cortically based acute infarction identified. Gray-white matter differentiation in the posterior fossa appears stable and within normal limits.  IMPRESSION: Stable and normal for age non contrast CT appearance of the brain.  Study discussed by telephone with Dr. Alexis Goodell at 12:33 pm on 10/31/2013.   Electronically Signed   By: Lars Pinks M.D.   On: 10/31/2013 12:33    Etta Quill PA-C [facility] [phone]  10/31/2013, 12:42 PM  Patient seen and examined.  Clinical course and management discussed.  Necessary edits performed.  I agree with the above.  Assessment and plan of care developed and discussed below.      Assessment: 63 y.o. male presenting to ED with dizziness, N/V.  Patient has recently had full work up for similar symptoms that revealed no intracranial or extracranial stenosis and no acute infarct on MRI of the brain and MRA of the head and neck.  Today NIHSS of 0.  Patient in sinus rhythm.  Head CT reviewed and shows no acute lesions.  Patient on ASA and Plavix at home. Previously symptoms lasted 3 weeks and were felt to be peripheral.  They resolved and ENT evaluation was not sought.  Suspect again today that symptoms are likely peripheral in origin.  Stroke Risk Factors - diabetes mellitus, hyperlipidemia and hypertension  Plan: 1.  Meclizine prn for vertigo. May use Valium if Meclizine is not successful in controlling symptoms.   2.  ENT evaluation.  May be performed as an outpatient.   Case discussed with Dr. Franchot Erichsen, MD [facility] [phone]  10/31/2013  1:33 PM

## 2013-10-31 NOTE — ED Notes (Signed)
Per pt about 9:30 this am had sudden onset of dizziness and vomiting. sts had similar episode like this a month ago and MRI was negative. sts some sweating.

## 2013-10-31 NOTE — ED Notes (Signed)
Md Regenia Skeeter at bedside.

## 2013-12-11 ENCOUNTER — Ambulatory Visit: Payer: 59 | Admitting: Diagnostic Neuroimaging

## 2013-12-21 ENCOUNTER — Other Ambulatory Visit: Payer: Self-pay

## 2014-02-13 ENCOUNTER — Encounter (HOSPITAL_COMMUNITY): Payer: Self-pay | Admitting: Cardiology

## 2014-02-14 ENCOUNTER — Encounter (HOSPITAL_COMMUNITY): Payer: Self-pay | Admitting: Cardiology

## 2014-09-02 ENCOUNTER — Other Ambulatory Visit: Payer: Self-pay

## 2014-10-03 ENCOUNTER — Ambulatory Visit (HOSPITAL_COMMUNITY)
Admission: RE | Admit: 2014-10-03 | Discharge: 2014-10-03 | Disposition: A | Discharge status: Home / Self Care | Payer: BLUE CROSS/BLUE SHIELD | Source: Ambulatory Visit | Attending: Rheumatology | Admitting: Rheumatology

## 2014-10-03 ENCOUNTER — Other Ambulatory Visit (HOSPITAL_COMMUNITY): Payer: Self-pay | Admitting: Rheumatology

## 2014-10-03 DIAGNOSIS — M25562 Pain in left knee: Secondary | ICD-10-CM | POA: Insufficient documentation

## 2014-10-03 DIAGNOSIS — M659 Synovitis and tenosynovitis, unspecified: Secondary | ICD-10-CM | POA: Diagnosis not present

## 2014-10-09 ENCOUNTER — Ambulatory Visit (HOSPITAL_COMMUNITY): Admission: RE | Admit: 2014-10-09 | Payer: Self-pay | Source: Ambulatory Visit

## 2015-05-01 ENCOUNTER — Other Ambulatory Visit: Payer: Self-pay | Admitting: Orthopaedic Surgery

## 2015-08-29 ENCOUNTER — Encounter: Payer: Self-pay | Admitting: Hematology

## 2015-08-29 ENCOUNTER — Telehealth: Payer: Self-pay | Admitting: Hematology

## 2015-08-29 NOTE — Telephone Encounter (Signed)
Appointment made with the patient's wife with Dr. Irene Limbo. Demographics verified. Letter to referring and patient.

## 2015-09-10 ENCOUNTER — Telehealth: Payer: Self-pay | Admitting: Hematology

## 2015-09-10 NOTE — Telephone Encounter (Signed)
[place] 7/7. Rescheduled 7/7 new patient appointment to 7/17. Date due to other existing patient conflicts [id number], [id number] and 7/14. Spoke with wife re change and new date/time for 7/17 @ 2 pm.

## 2015-09-10 NOTE — H&P (Signed)
OFFICE VISIT NOTES COPIED TO EPIC FOR DOCUMENTATION  . The patient is a 65 year old male who presents for a follow-up for CAD. [name] is a Caucasian male with history of known coronary artery disease. Patient was last seen by me about 9 months ago, he previously had an appointment to see me back a year later. However his wife made an appointment stating that she has been more than usually fatigued over the past few months.  He has had left knee arthroscopy due to recurrent effusion in Feb 2017. He has also undergone laser surgery to his left eye.   I had seen in 3 months ago, he had complained of marked fatigue and malaise. I had held his Crestor and discontinue niacin to see whether his symptoms improved.  Patient states that he has noticed mild improvement in generalized aches and pains, however continues to complain of marked fatigue and malaise. He is also noticed dyspnea with exertion activity. He states that he was able to mow the lawn in less than a day a year ago compared to know he takes more than 2 days and has to take frequent rest and breaks. No chest pain, PND or orthopnea. He denies any symptoms of claudication. Wife present at bedside. He also complains of marked daytime somnolence and feels not well rested in the morning. DM is well controlled.  Additional reasons for visit:  Follow-up for Fatigue    Problem List/Past Medical Alan Wang; 2015/08/19 2:43 PM) Atherosclerosis of native coronary artery of native heart without angina pectoris (I25.10)  CAD/ASHD : Heart cath 04/06/11: Proximal and mid circumflex 2.75x22 mm Resolute stent. 03/08/11: Stent to large Ramus intermediate with 3.5x15 Integrity stent. Mid LAD 3.0x23 and 2.75x12 Xience placed in 05/2008 was patent. Mild diffuse distal RCA disease. EF 60%. Lexiscan nuclear stress: 02/12/11: 1 mm ST depression suggestive of ischemia noted with pharmacologic stress testing in the inferior and inferoapical  leads. There was a moderate sized inferior and inferoapical scar with moderate peri-infarct ischemia.The perfusion study demonstrated moderate chest wall attenuation artifact. Echo 02/03/11: Normal LVEF. Mild LVH. Trace PI Labwork  A1c 7.1 in Feb 2017. Told to have normal lipids. Labs 09/17/2014: Serum glucose 159 (nonfasting) , creatinine 0.72, potassium 4.5, CBC normal Labs 01/15/2014: CBC normal, serum glucose 133, creatinine 0.78, potassium 4.7, CMP otherwise normal TSH 1.67, vitamin D 32, total cholesterol 89, triglycerides 49, HDL 43, LDL 36 Postsurgical percutaneous transluminal coronary angioplasty status (Z98.61)  CAD/ASHD : Heart cath 04/06/11: Proximal and mid circumflex 2.75x22 mm Resolute stent. 03/08/11: Stent to large Ramus intermediate with 3.5x15 Integrity stent. Mid LAD 3.0x23 and 2.75x12 Xience placed in 05/2008 was patent. Mild diffuse distal RCA disease. EF 60% Controlled insulin dependent diabetes mellitus (E11.9)  Mixed hyperlipidemia (E78.2)  History of TIA (transient ischemic attack) (Z86.73)  Previous Stroke ? TIA 2008-2009 presenting with dysarthria. Benign essential hypertension (I10)  Malaise and fatigue (R53.81, R53.83)  Obesity, unspecified (278.00)  Impotence of organic origin [id number]  Arthritis of knee (M17.10)   Allergies Alan Wang; 08-19-15 2:43 PM) Avandia *ANTIDIABETICS*  Swelling. Patchy swelling skin  Family History Lolita Lenz; 08-19-2015 2:43 PM) Mother  Deceased. at age 45 from [place]; Had a defribrilator Father  Living; Has Alzheimers, HTN Sister 1  21 Yrs Younger; Has DM, Chronic Fatigue  Social History Alan Wang; 08/19/2015 2:43 PM) Current tobacco use  Never smoker. Alcohol Use  Occasional alcohol use. Marital status  Married. Number of Children  2. Living Situation  Lives  with spouse.  Past Surgical History Alan Wang; 08/18/2015 2:43 PM) Heart cath 04/06/11: Proximal and mid circumflex 2.75x22 mm  Resolute stent.  03/08/11: Stent to large Ramus intermediate with 3.5x15 Integrity stent. Mid LAD 3.0x23 and 2.75x12 Xience placed in 05/2008 was patent. Mild diffuse distal RCA disease. EF 60%.  TIA before stents presenting with dysarthria.  Laser Surgery 04/2014 Left. Arthroscopic Knee Surgery - Left 05/06/2015  Medication History Laverda Page, MD; 08/18/2015 3:10 PM) Carvedilol (12.5MG  Tablet, 1 (one) Tablet Oral two times daily, Taken starting 08/07/2015) Active. Clopidogrel Bisulfate (75MG  Tablet, 1 (one) Tablet Tablet Oral daily after a meal, Taken starting 04/16/2015) Active. Valsartan (160MG  Tablet,  (one half) Tablet Tablet Oral daily, Taken starting 04/16/2015) Active. Cymbalta (30MG  Capsule DR Part, 1 Oral daily) Active. Vitamin C (500MG  Tablet, 1 Oral daily) Active. Centrum Silver (1 Oral daily) Active. Aspirin (81MG  Tablet, 1 Oral at bedtime) Active. MetFORMIN HCl (500MG  Tablet, 2 Oral two times daily) Active. Cinnamon (500MG  Capsule, 2 Oral daily) Active. Aleve (220MG  Tablet, 2 Oral daily) Active. Invokana (100MG  Tablet, 1 Oral fdaily) Active. Methocarbamol (500MG  Tablet, 1-2 Oral daily as needed) Active. TraMADol HCl (50MG  Tablet, 1-2 Oral twice daily as needed) Active. Sudafed 30mg   (2 in morning and 2 at night as needed) Active. Chlor-Pheniramine Antihistamine 4mg  (2 at bedtime as needed) Active. Stool Softener (1 Oral daily) Specific dose unknown - Active. NovoLIN N (100UNIT/ML Suspension, 20r, 10n Subcutaneous in morning) Active. NovoLIN R (100UNIT/ML Solution, 25r, 30n Injection in evening) Active. Medications Reconciled (verbally) Crestor (20MG  Tablet, 1/2 Oral daily, Taken starting 08/18/2015) Active: Mild hyperlip. (Restart (held for statin holiday due to fatigue))  Diagnostic Studies History Alan Wang; 08/18/2015 2:43 PM) ECG 10/14/11: NSR @ 67/min. Normasl intervals. Low voltage. Poor R progression. No ischemia.  Heart cath  03/08/11: PCI Ramus intermediate with 3.5x15 Integrity stent  Lesiscan stress 02/12/11: Inferior scar with ischemia. Anterior chest wall attenuation. Abnormal EKG.  Echo 02/03/11: Normal LVEF. Mild LVH. Trace PI  shvc echo 05/2008: Normal LV, mild diastolic dysfunction. nuc 10/10: Normal perfusion. anterior ischemia noted previously not present.  cath 3/10: Procedure: Mid LAD Xience 3.0x23 and 2.75x12 mm stents. Severe Cx disease  Colonoscopy 2013 Normal.    Review of Systems Laverda Page MD; 08/18/2015 4:11 PM) General Present- Fatigue (chronic, but worse lately). Not Present- Fever. Respiratory Present- Decreased Exercise Tolerance. Not Present- Bloody sputum and Wakes up from Sleep Wheezing or Short of Breath. Cardiovascular Not Present- Chest Pain, Claudications, Edema, Leg Cramps, Orthopnea, Palpitations and Paroxysmal Nocturnal Dyspnea. Gastrointestinal Not Present- Black, Tarry Stool, Bloody Stool and Heartburn. Musculoskeletal Present- Joint Pain (bilateral degenerative joint disease of the knees, left knee arthroscopy Feb 2017.) and Joint Swelling (left knee). Not Present- Claudication. Neurological Not Present- Focal Neurological Symptoms. Endocrine Not Present- Excessive Thirst and Excessive Urination. Hematology Not Present- Blood Clots, Easy Bruising and Nose Bleed.  Vitals Alan Wang; 08/18/2015 2:55 PM) 08/18/2015 2:46 PM Weight: 217.31 lb Height: 70in Body Surface Area: 2.16 m Body Mass Index: 31.18 kg/m  Pulse: 70 (Regular)  P.OX: 97% (Room air) BP: 147/83 (Sitting, Left Arm, Standard)       Physical Exam Laverda Page, MD; 08/18/2015 10:23 PM) General Mental Status-Alert. General Appearance-Cooperative and Appears stated age. Build & Nutrition-Moderately built and Mildly obese.  Head and Neck Thyroid Gland Characteristics - normal size and consistency and no palpable nodules.  Chest and Lung Exam Chest and lung exam  reveals -quiet, even and easy respiratory effort with no use  of accessory muscles, non-tender and on auscultation, normal breath sounds, no adventitious sounds.  Cardiovascular Cardiovascular examination reveals -normal heart sounds, regular rate and rhythm with no murmurs, carotid auscultation reveals no bruits, abdominal aorta auscultation reveals no bruits and no prominent pulsation, femoral artery auscultation bilaterally reveals normal pulses, no bruits, no thrills, normal pedal pulses bilaterally and no digital clubbing, cyanosis, edema, increased warmth or tenderness.  Abdomen Palpation/Percussion Normal exam - Non Tender and No hepatosplenomegaly.  Neurologic Neurologic evaluation reveals -alert and oriented x 3 with no impairment of recent or remote memory. Motor-Grossly intact without any focal deficits.  Musculoskeletal Global Assessment Left Lower Extremity - no deformities, masses or tenderness, no known fractures. Right Lower Extremity - no deformities, masses or tenderness, no known fractures.    Assessment & Plan Laverda Page MD; 08/18/2015 10:24 PM) Atherosclerosis of native coronary artery of native heart without angina pectoris (I25.10) Story: CAD/ASHD : Heart cath 04/06/11: Proximal and mid circumflex 2.75x22 mm Resolute stent. 03/08/11: Stent to large Ramus intermediate with 3.5x15 Integrity stent. Mid LAD 3.0x23 and 2.75x12 Xience placed in 05/2008 was patent. Mild diffuse distal RCA disease. EF 60%.  EKG 05/16/2014: Normal sinus rhythm at rate of 75 bpm, normal intervals. No evidence of ischemia, normal EKG.  Future Plans Dyspnea on exertion (R06.09)  Labwork Story: A1c 7.1 in Feb 2017. Told to have normal lipids.  Labs 09/17/2014: Serum glucose 159 (nonfasting) , creatinine 0.72, potassium 4.5, CBC normal  Labs 01/15/2014: CBC normal, serum glucose 133, creatinine 0.78, potassium 4.7, CMP otherwise normal TSH 1.67, vitamin D 32, total cholesterol  89, triglycerides 49, HDL 43, LDL 36 Malaise and fatigue (R53.81) Future Plans 02/06/2016: TSH (THYROID STIMULATING HORMONE) (91478) - one time Controlled insulin dependent diabetes mellitus (E11.9) Current Plans Mechanism of underlying disease process and action of medications discussed with the patient. I discussed primary/secondary prevention and also dietary counceling was done. I have advised him to restart Crestor 10 mg by mouth daily, I'm concerned about his ongoing symptoms of marked fatigue and dyspnea would also be a marker for progression of coronary artery disease in a patient with scar diabetes and known established CAD. I'll set him up for a Lexiscan Myoview stress test, patient states that he will not be able to exercise on the treadmill due to marked fatigue and dyspnea and exercise intolerance.  I'll also set him up for a echocardiogram. I'll make a referral for sleep study to be evaluated due to daytime somnolence and dyspnea. Unless these tests are abnormal, I will see him back in 6 months with follow-up lipids, CBC, CMP, A1c and TSH and I will forward copies of these to his PCP. He normally obtains these results from his PCP and also Dr. Chalmers Cater with regard to diabetes control.  CC; Aura Dials, MD.; CC; Dr. Hassan Buckler. Nehemiah Settle, MD  Future Plans 02/06/2016: HEMOGLOBIN GLYCLATED (HGB A1C) [id number]) - one time Addendum Note(Bridgette Allison AGNP-C; 08/27/2015 7:49 AM) Echocardiogram 08/26/2015: Left ventricle cavity is normal in size. Moderate concentric hypertrophy of the left ventricle. Borderline global hypokinesis. Doppler evidence of grade I (impaired) diastolic dysfunction. Left ventricle regional wall motion findings: No wall motion abnormalities. Visual EF is 45-50%. Calculated EF 45%. Left atrial cavity is mildly dilated. Trace mitral regurgitation. Trace tricuspid regurgitation. Unable to estimate PA pressure due to absence/minimal TR signal. Compared to the study  done on 02/03/2011, EF previously normal.  Lexiscan myoview stress test 08/22/2015: 1. Resting EKG demonstrated normal sinus rhythm, normal axis, poor  R-wave progression, low voltage complexes. Stress EKG is nondiagnostic for ischemia as its pharmacologic stress test. Stress symptoms included dyspnea. 2. Perfusion imaging study demonstrates a small sized mild lateral wall ischemia noted towards the apex. In addition there is mild anterior wall thinning extending from the mid ventricle towards the apex. The left ventricle is normal in size with rest and stress images. Left ventricular systolic function calculated by QGS was moderately depressed at 37% with global hypokinesis. In a patient with knows severe 3 vessel CAD and PTCA, this represents a high risk study.  Stress test abnormal, suspect progression of CAD, LVEF also reduced. Discussed results with patient. Given symptoms and abnormal test results, will schedule for cardiac catheterization, and possible angioplasty. We discussed regarding risks, benefits, alternatives to this including CTA and continued medical therapy. Patient wants to proceed. Understands <1-2% risk of death, stroke, MI, urgent CABG, bleeding, infection, renal failure but not limited to these.  Signed by Laverda Page, MD (08/18/2015 10:25 PM)

## 2015-09-12 ENCOUNTER — Ambulatory Visit: Payer: BLUE CROSS/BLUE SHIELD | Admitting: Hematology

## 2015-09-16 ENCOUNTER — Ambulatory Visit (HOSPITAL_COMMUNITY)
Admission: RE | Admit: 2015-09-16 | Discharge: 2015-09-16 | Disposition: A | Discharge status: Home / Self Care | Payer: BLUE CROSS/BLUE SHIELD | Source: Ambulatory Visit | Attending: Cardiology | Admitting: Cardiology

## 2015-09-16 ENCOUNTER — Encounter (HOSPITAL_COMMUNITY)
Admission: RE | Disposition: A | Discharge status: Home / Self Care | Payer: Self-pay | Source: Ambulatory Visit | Attending: Cardiology

## 2015-09-16 ENCOUNTER — Encounter (HOSPITAL_COMMUNITY): Payer: Self-pay | Admitting: Cardiology

## 2015-09-16 DIAGNOSIS — E669 Obesity, unspecified: Secondary | ICD-10-CM | POA: Diagnosis not present

## 2015-09-16 DIAGNOSIS — M13869 Other specified arthritis, unspecified knee: Secondary | ICD-10-CM | POA: Insufficient documentation

## 2015-09-16 DIAGNOSIS — Z955 Presence of coronary angioplasty implant and graft: Secondary | ICD-10-CM | POA: Insufficient documentation

## 2015-09-16 DIAGNOSIS — N521 Erectile dysfunction due to diseases classified elsewhere: Secondary | ICD-10-CM | POA: Insufficient documentation

## 2015-09-16 DIAGNOSIS — I25118 Atherosclerotic heart disease of native coronary artery with other forms of angina pectoris: Secondary | ICD-10-CM | POA: Insufficient documentation

## 2015-09-16 DIAGNOSIS — Z7984 Long term (current) use of oral hypoglycemic drugs: Secondary | ICD-10-CM | POA: Insufficient documentation

## 2015-09-16 DIAGNOSIS — Z6831 Body mass index (BMI) 31.0-31.9, adult: Secondary | ICD-10-CM | POA: Diagnosis not present

## 2015-09-16 DIAGNOSIS — I10 Essential (primary) hypertension: Secondary | ICD-10-CM | POA: Insufficient documentation

## 2015-09-16 DIAGNOSIS — I2510 Atherosclerotic heart disease of native coronary artery without angina pectoris: Secondary | ICD-10-CM | POA: Diagnosis present

## 2015-09-16 DIAGNOSIS — Z833 Family history of diabetes mellitus: Secondary | ICD-10-CM | POA: Diagnosis not present

## 2015-09-16 DIAGNOSIS — E782 Mixed hyperlipidemia: Secondary | ICD-10-CM | POA: Insufficient documentation

## 2015-09-16 DIAGNOSIS — E119 Type 2 diabetes mellitus without complications: Secondary | ICD-10-CM | POA: Insufficient documentation

## 2015-09-16 DIAGNOSIS — I2582 Chronic total occlusion of coronary artery: Secondary | ICD-10-CM | POA: Diagnosis not present

## 2015-09-16 DIAGNOSIS — Z794 Long term (current) use of insulin: Secondary | ICD-10-CM | POA: Insufficient documentation

## 2015-09-16 DIAGNOSIS — Z8673 Personal history of transient ischemic attack (TIA), and cerebral infarction without residual deficits: Secondary | ICD-10-CM | POA: Diagnosis not present

## 2015-09-16 DIAGNOSIS — Z7982 Long term (current) use of aspirin: Secondary | ICD-10-CM | POA: Diagnosis not present

## 2015-09-16 DIAGNOSIS — Z8249 Family history of ischemic heart disease and other diseases of the circulatory system: Secondary | ICD-10-CM | POA: Diagnosis not present

## 2015-09-16 HISTORY — PX: CARDIAC CATHETERIZATION: SHX172

## 2015-09-16 LAB — GLUCOSE, CAPILLARY: GLUCOSE-CAPILLARY: 127 mg/dL — AB (ref 65–99)

## 2015-09-16 SURGERY — LEFT HEART CATH AND CORONARY ANGIOGRAPHY
Anesthesia: LOCAL

## 2015-09-16 MED ORDER — ASPIRIN 81 MG PO CHEW
CHEWABLE_TABLET | ORAL | Status: AC
  Filled 2015-09-16: qty 1

## 2015-09-16 MED ORDER — HYDROMORPHONE HCL 1 MG/ML IJ SOLN
INTRAMUSCULAR | Status: AC
  Filled 2015-09-16: qty 1

## 2015-09-16 MED ORDER — HEPARIN SODIUM (PORCINE) 1000 UNIT/ML IJ SOLN
INTRAMUSCULAR | Status: DC | PRN
  Administered 2015-09-16: 5000 [IU] via INTRAVENOUS

## 2015-09-16 MED ORDER — SODIUM CHLORIDE 0.9 % WEIGHT BASED INFUSION: 1.0000 mL/kg/h | INTRAVENOUS | Status: DC

## 2015-09-16 MED ORDER — MIDAZOLAM HCL 2 MG/2ML IJ SOLN
INTRAMUSCULAR | Status: DC | PRN
  Administered 2015-09-16: 2 mg via INTRAVENOUS

## 2015-09-16 MED ORDER — SODIUM CHLORIDE 0.9% FLUSH: 3.0000 mL | INTRAVENOUS | Status: DC | PRN

## 2015-09-16 MED ORDER — VERAPAMIL HCL 2.5 MG/ML IV SOLN
INTRA_ARTERIAL | Status: DC | PRN
  Administered 2015-09-16: 7 mL via INTRA_ARTERIAL

## 2015-09-16 MED ORDER — HEPARIN (PORCINE) IN NACL 2-0.9 UNIT/ML-% IJ SOLN
INTRAMUSCULAR | Status: DC | PRN
  Administered 2015-09-16: 12:00:00

## 2015-09-16 MED ORDER — SODIUM CHLORIDE 0.9 % WEIGHT BASED INFUSION
3.0000 mL/kg/h | INTRAVENOUS | Status: DC
  Administered 2015-09-16: 3 mL/kg/h via INTRAVENOUS

## 2015-09-16 MED ORDER — NITROGLYCERIN 1 MG/10 ML FOR IR/CATH LAB
INTRA_ARTERIAL | Status: AC
  Filled 2015-09-16: qty 10

## 2015-09-16 MED ORDER — MIDAZOLAM HCL 2 MG/2ML IJ SOLN
INTRAMUSCULAR | Status: AC
  Filled 2015-09-16: qty 2

## 2015-09-16 MED ORDER — ASPIRIN 81 MG PO CHEW
81.0000 mg | CHEWABLE_TABLET | ORAL | Status: AC
  Administered 2015-09-16: 81 mg via ORAL

## 2015-09-16 MED ORDER — IOPAMIDOL (ISOVUE-370) INJECTION 76%
INTRAVENOUS | Status: DC | PRN
  Administered 2015-09-16: 85 mL via INTRA_ARTERIAL

## 2015-09-16 MED ORDER — SODIUM CHLORIDE 0.9 % IV SOLN: 250.0000 mL | INTRAVENOUS | Status: DC | PRN

## 2015-09-16 MED ORDER — SODIUM CHLORIDE 0.9% FLUSH: 3.0000 mL | Freq: Two times a day (BID) | INTRAVENOUS | Status: DC

## 2015-09-16 MED ORDER — IOPAMIDOL (ISOVUE-370) INJECTION 76%
INTRAVENOUS | Status: AC
  Filled 2015-09-16: qty 100

## 2015-09-16 MED ORDER — NITROGLYCERIN 1 MG/10 ML FOR IR/CATH LAB
INTRA_ARTERIAL | Status: DC | PRN
  Administered 2015-09-16: 200 ug via INTRACORONARY

## 2015-09-16 MED ORDER — METFORMIN HCL ER 500 MG PO TB24: 1000.0000 mg | ORAL_TABLET | Freq: Two times a day (BID) | ORAL | Status: DC

## 2015-09-16 MED ORDER — HEPARIN (PORCINE) IN NACL 2-0.9 UNIT/ML-% IJ SOLN
INTRAMUSCULAR | Status: AC
  Filled 2015-09-16: qty 1000

## 2015-09-16 MED ORDER — HEPARIN SODIUM (PORCINE) 1000 UNIT/ML IJ SOLN
INTRAMUSCULAR | Status: AC
  Filled 2015-09-16: qty 1

## 2015-09-16 MED ORDER — VERAPAMIL HCL 2.5 MG/ML IV SOLN
INTRAVENOUS | Status: AC
  Filled 2015-09-16: qty 2

## 2015-09-16 MED ORDER — LIDOCAINE HCL (PF) 1 % IJ SOLN
INTRAMUSCULAR | Status: AC
  Filled 2015-09-16: qty 30

## 2015-09-16 MED ORDER — HYDROMORPHONE HCL 1 MG/ML IJ SOLN
INTRAMUSCULAR | Status: DC | PRN
  Administered 2015-09-16: 0.5 mg via INTRAVENOUS

## 2015-09-16 SURGICAL SUPPLY — 8 items
CATH OPTITORQUE TIG 4.0 5F (CATHETERS) ×1 IMPLANT
DEVICE RAD COMP TR BAND LRG (VASCULAR PRODUCTS) ×1 IMPLANT
GLIDESHEATH SLEND A-KIT 6F 20G (SHEATH) ×1 IMPLANT
KIT HEART LEFT (KITS) ×2 IMPLANT
PACK CARDIAC CATHETERIZATION (CUSTOM PROCEDURE TRAY) ×2 IMPLANT
TRANSDUCER W/STOPCOCK (MISCELLANEOUS) ×2 IMPLANT
TUBING CIL FLEX 10 FLL-RA (TUBING) ×2 IMPLANT
WIRE SAFE-T 1.5MM-J .035X260CM (WIRE) ×1 IMPLANT

## 2015-09-16 NOTE — Interval H&P Note (Signed)
History and Physical Interval Note:  09/16/2015 11:00 AM  Alan Wang  has presented today for surgery, with the diagnosis of Abnromal Stress Test/CAD  The various methods of treatment have been discussed with the patient and family. After consideration of risks, benefits and other options for treatment, the patient has consented to  Procedure(s): Left Heart Cath and Coronary Angiography (N/A) and possible PCI as a surgical intervention .  The patient's history has been reviewed, patient examined, no change in status, stable for surgery.  I have reviewed the patient's chart and labs.  Questions were answered to the patient's satisfaction.   Ischemic Symptoms? CCS III (Marked limitation of ordinary activity) Anti-ischemic Medical Therapy? Minimal Therapy (1 class of medications) Non-invasive Test Results? High-risk stress test findings: cardiac mortality >3%/yr Prior CABG? No Previous CABG   Patient Information:   1-2V CAD, no prox LAD  A (8)  Indication: 18; Score: 8   Patient Information:   CTO of 1 vessel, no other CAD  A (7)  Indication: 28; Score: 7   Patient Information:   1V CAD with prox LAD  A (9)  Indication: 34; Score: 9   Patient Information:   2V-CAD with prox LAD  A (9)  Indication: 40; Score: 9   Patient Information:   3V-CAD without LMCA  A (9)  Indication: 46; Score: 9   Patient Information:   3V-CAD without LMCA With Abnormal LV systolic function  A (9)  Indication: 48; Score: 9   Patient Information:   LMCA-CAD  A (9)  Indication: 49; Score: 9   Patient Information:   2V-CAD with prox LAD PCI  A (7)  Indication: 62; Score: 7   Patient Information:   2V-CAD with prox LAD CABG  A (8)  Indication: 62; Score: 8   Patient Information:   3V-CAD without LMCA With Low CAD burden(i.e., 3 focal stenoses, low SYNTAX score) PCI  A (7)  Indication: 63; Score: 7   Patient Information:   3V-CAD without LMCA With Low CAD  burden(i.e., 3 focal stenoses, low SYNTAX score) CABG  A (9)  Indication: 63; Score: 9   Patient Information:   3V-CAD without LMCA E06c - Intermediate-high CAD burden (i.e., multiple diffuse lesions, presence of CTO, or high SYNTAX score) PCI  U (4)  Indication: 64; Score: 4   Patient Information:   3V-CAD without LMCA E06c - Intermediate-high CAD burden (i.e., multiple diffuse lesions, presence of CTO, or high SYNTAX score) CABG  A (9)  Indication: 64; Score: 9   Patient Information:   LMCA-CAD With Isolated LMCA stenosis  PCI  U (6)  Indication: 65; Score: 6   Patient Information:   LMCA-CAD With Isolated LMCA stenosis  CABG  A (9)  Indication: 65; Score: 9   Patient Information:   LMCA-CAD Additional CAD, low CAD burden (i.e., 1- to 2-vessel additional involvement, low SYNTAX score) PCI  U (5)  Indication: 66; Score: 5   Patient Information:   LMCA-CAD Additional CAD, low CAD burden (i.e., 1- to 2-vessel additional involvement, low SYNTAX score) CABG  A (9)  Indication: 66; Score: 9   Patient Information:   LMCA-CAD Additional CAD, intermediate-high CAD burden (i.e., 3-vessel involvement, presence of CTO, or high SYNTAX score) PCI  I (3)  Indication: 67; Score: 3   Patient Information:   LMCA-CAD Additional CAD, intermediate-high CAD burden (i.e., 3-vessel involvement, presence of CTO, or high SYNTAX score) CABG  A (9)  Indication: 67; Score: 9  Alan Wang,  Alan Wang

## 2015-09-16 NOTE — Discharge Instructions (Signed)
Radial Site Care °Refer to this sheet in the next few weeks. These instructions provide you with information about caring for yourself after your procedure. Your health care provider may also give you more specific instructions. Your treatment has been planned according to current medical practices, but problems sometimes occur. Call your health care provider if you have any problems or questions after your procedure. °WHAT TO EXPECT AFTER THE PROCEDURE °After your procedure, it is typical to have the following: °· Bruising at the radial site that usually fades within 1-2 weeks. °· Blood collecting in the tissue (hematoma) that may be painful to the touch. It should usually decrease in size and tenderness within 1-2 weeks. °HOME CARE INSTRUCTIONS °· Take medicines only as directed by your health care provider. °· You may shower 24-48 hours after the procedure or as directed by your health care provider. Remove the bandage (dressing) and gently wash the site with plain soap and water. Pat the area dry with a clean towel. Do not rub the site, because this may cause bleeding. °· Do not take baths, swim, or use a hot tub until your health care provider approves. °· Check your insertion site every day for redness, swelling, or drainage. °· Do not apply powder or lotion to the site. °· Do not flex or bend the affected arm for 24 hours or as directed by your health care provider. °· Do not push or pull heavy objects with the affected arm for 24 hours or as directed by your health care provider. °· Do not lift over 10 lb (4.5 kg) for 5 days after your procedure or as directed by your health care provider. °· Ask your health care provider when it is okay to: °¨ Return to work or school. °¨ Resume usual physical activities or sports. °¨ Resume sexual activity. °· Do not drive home if you are discharged the same day as the procedure. Have someone else drive you. °· You may drive 24 hours after the procedure unless otherwise  instructed by your health care provider. °· Do not operate machinery or power tools for 24 hours after the procedure. °· If your procedure was done as an outpatient procedure, which means that you went home the same day as your procedure, a responsible adult should be with you for the first 24 hours after you arrive home. °· Keep all follow-up visits as directed by your health care provider. This is important. °SEEK MEDICAL CARE IF: °· You have a fever. °· You have chills. °· You have increased bleeding from the radial site. Hold pressure on the site. °SEEK IMMEDIATE MEDICAL CARE IF: °· You have unusual pain at the radial site. °· You have redness, warmth, or swelling at the radial site. °· You have drainage (other than a small amount of blood on the dressing) from the radial site. °· The radial site is bleeding, and the bleeding does not stop after 30 minutes of holding steady pressure on the site. °· Your arm or hand becomes pale, cool, tingly, or numb. °  °This information is not intended to replace advice given to you by your health care provider. Make sure you discuss any questions you have with your health care provider. °  °Document Released: 03/27/2010 Document Revised: 03/15/2014 Document Reviewed: 09/10/2013 °Elsevier Interactive Patient Education ©2016 [employer] ° °

## 2015-09-22 ENCOUNTER — Telehealth: Payer: Self-pay | Admitting: Hematology

## 2015-09-22 ENCOUNTER — Other Ambulatory Visit (HOSPITAL_BASED_OUTPATIENT_CLINIC_OR_DEPARTMENT_OTHER): Payer: BLUE CROSS/BLUE SHIELD

## 2015-09-22 ENCOUNTER — Ambulatory Visit (HOSPITAL_BASED_OUTPATIENT_CLINIC_OR_DEPARTMENT_OTHER): Payer: BLUE CROSS/BLUE SHIELD | Admitting: Hematology

## 2015-09-22 ENCOUNTER — Encounter: Payer: Self-pay | Admitting: Hematology

## 2015-09-22 ENCOUNTER — Ambulatory Visit (HOSPITAL_COMMUNITY)
Admission: RE | Admit: 2015-09-22 | Discharge: 2015-09-22 | Disposition: A | Discharge status: Home / Self Care | Payer: BLUE CROSS/BLUE SHIELD | Source: Ambulatory Visit | Attending: Hematology | Admitting: Hematology

## 2015-09-22 VITALS — BP 158/83 | HR 69 | Temp 98.2°F | Resp 18 | Ht 70.0 in | Wt 217.6 lb

## 2015-09-22 DIAGNOSIS — D472 Monoclonal gammopathy: Secondary | ICD-10-CM

## 2015-09-22 DIAGNOSIS — I700 Atherosclerosis of aorta: Secondary | ICD-10-CM | POA: Insufficient documentation

## 2015-09-22 DIAGNOSIS — R5383 Other fatigue: Secondary | ICD-10-CM

## 2015-09-22 DIAGNOSIS — R5382 Chronic fatigue, unspecified: Secondary | ICD-10-CM

## 2015-09-22 DIAGNOSIS — M1990 Unspecified osteoarthritis, unspecified site: Secondary | ICD-10-CM

## 2015-09-22 LAB — CBC & DIFF AND RETIC
BASO%: 0.3 % (ref 0.0–2.0)
BASOS ABS: 0 10*3/uL (ref 0.0–0.1)
EOS%: 1.7 % (ref 0.0–7.0)
Eosinophils Absolute: 0.1 10*3/uL (ref 0.0–0.5)
HCT: 46.4 % (ref 38.4–49.9)
HEMOGLOBIN: 15.9 g/dL (ref 13.0–17.1)
Immature Retic Fract: 6.7 % (ref 3.00–10.60)
LYMPH%: 37.3 % (ref 14.0–49.0)
MCH: 31.5 pg (ref 27.2–33.4)
MCHC: 34.3 g/dL (ref 32.0–36.0)
MCV: 92.1 fL (ref 79.3–98.0)
MONO#: 0.8 10*3/uL (ref 0.1–0.9)
MONO%: 12 % (ref 0.0–14.0)
NEUT%: 48.7 % (ref 39.0–75.0)
NEUTROS ABS: 3.1 10*3/uL (ref 1.5–6.5)
NRBC: 0 % (ref 0–0)
PLATELETS: 169 10*3/uL (ref 140–400)
RBC: 5.04 10*6/uL (ref 4.20–5.82)
RDW: 13.9 % (ref 11.0–14.6)
Retic %: 1.7 % (ref 0.80–1.80)
Retic Ct Abs: 85.68 10*3/uL (ref 34.80–93.90)
WBC: 6.4 10*3/uL (ref 4.0–10.3)
lymph#: 2.4 10*3/uL (ref 0.9–3.3)

## 2015-09-22 LAB — COMPREHENSIVE METABOLIC PANEL
ALBUMIN: 3.7 g/dL (ref 3.5–5.0)
ALK PHOS: 77 U/L (ref 40–150)
ALT: 16 U/L (ref 0–55)
ANION GAP: 8 meq/L (ref 3–11)
AST: 18 U/L (ref 5–34)
BILIRUBIN TOTAL: 0.47 mg/dL (ref 0.20–1.20)
BUN: 15.8 mg/dL (ref 7.0–26.0)
CO2: 27 mEq/L (ref 22–29)
CREATININE: 0.8 mg/dL (ref 0.7–1.3)
Calcium: 9 mg/dL (ref 8.4–10.4)
Chloride: 105 mEq/L (ref 98–109)
GLUCOSE: 75 mg/dL (ref 70–140)
Potassium: 3.9 mEq/L (ref 3.5–5.1)
SODIUM: 140 meq/L (ref 136–145)
TOTAL PROTEIN: 6.9 g/dL (ref 6.4–8.3)

## 2015-09-22 LAB — LACTATE DEHYDROGENASE: LDH: 166 U/L (ref 125–245)

## 2015-09-22 NOTE — Telephone Encounter (Signed)
Gave pt cal & avs °

## 2015-09-23 LAB — BETA 2 MICROGLOBULIN, SERUM: BETA 2: 1.9 mg/L (ref 0.6–2.4)

## 2015-09-23 LAB — KAPPA/LAMBDA LIGHT CHAINS
IG KAPPA FREE LIGHT CHAIN: 11.9 mg/L (ref 3.3–19.4)
Ig Lambda Free Light Chain: 14.7 mg/L (ref 5.7–26.3)
Kappa/Lambda FluidC Ratio: 0.81 (ref 0.26–1.65)

## 2015-09-23 LAB — RHEUMATOID FACTOR: RA Latex Turbid.: 10.3 IU/mL (ref 0.0–13.9)

## 2015-09-23 LAB — HEPATITIS C ANTIBODY

## 2015-09-23 LAB — SEDIMENTATION RATE: SED RATE: 2 mm/h (ref 0–30)

## 2015-09-23 LAB — CYCLIC CITRUL PEPTIDE ANTIBODY, IGG/IGA: CCP Antibodies IgG/IgA: 9 units (ref 0–19)

## 2015-09-23 LAB — HIV ANTIBODY (ROUTINE TESTING W REFLEX): HIV SCREEN 4TH GENERATION: NONREACTIVE

## 2015-09-24 DIAGNOSIS — R5383 Other fatigue: Secondary | ICD-10-CM | POA: Insufficient documentation

## 2015-09-24 LAB — MULTIPLE MYELOMA PANEL, SERUM
ALBUMIN/GLOB SERPL: 1.3 (ref 0.7–1.7)
ALPHA 1: 0.2 g/dL (ref 0.0–0.4)
Albumin SerPl Elph-Mcnc: 3.6 g/dL (ref 2.9–4.4)
Alpha2 Glob SerPl Elph-Mcnc: 0.8 g/dL (ref 0.4–1.0)
B-Globulin SerPl Elph-Mcnc: 1 g/dL (ref 0.7–1.3)
GAMMA GLOB SERPL ELPH-MCNC: 0.9 g/dL (ref 0.4–1.8)
GLOBULIN, TOTAL: 2.8 g/dL (ref 2.2–3.9)
IGA/IMMUNOGLOBULIN A, SERUM: 176 mg/dL (ref 61–437)
IGM (IMMUNOGLOBIN M), SRM: 125 mg/dL (ref 20–172)
IgG, Qn, Serum: 729 mg/dL (ref 700–1600)
Total Protein: 6.4 g/dL (ref 6.0–8.5)

## 2015-09-24 NOTE — Progress Notes (Signed)
[name]    HEMATOLOGY/ONCOLOGY CONSULTATION NOTE  Date of Service: .09/22/2015  Patient Care Team: Aura Dials, MD as PCP - General (Family Medicine)  CHIEF COMPLAINTS/PURPOSE OF CONSULTATION:  M spike on SPEP  HISTORY OF PRESENTING ILLNESS:   Alan Wang is a wonderful 65 y.o. male who has been referred to [place] by Dr Estanislado Pandy for evaluation and management of abnormal SPEP.  Patient is multiple medical comorbidities including hypertension, diabetes, dyslipidemia, coronary disease status post PCI in the past who has had several months of significant fatigue and generalized body aches.  Patient was seen by orthopedics earlier this year in February 2017 when he had an arthroscopic synovectomy. Patient was noted to have inflammatory arthritis and was started on sulfasalazine by Dr. Estanislado Pandy in March 2017.  As a part of his workup for inflammatory arthritis and body aches the patient had an SPEP done which which showed 2 bands of M protein at 0.3 g/dL and 0.1 g/dL.  He was referred to [place] for further evaluation of these M spikes. Patient reports that he saw his cardiologist Dr. Einar Gip for his fatigue and had a recent cardiac catheterization with no stenting. Patient's left knee effusion has been noted to be better in the sulfasalazine dose was recently increased.  He reports no other acute new symptoms.  He was not noted to have anemia, new renal failure hypercalcemia. Does not report any symptoms of neuropathy. No fevers/chills/night sweats/weight loss.  Lab showed low testosterone levels. TSH levels noted to be normal Patient has no chest pain or shortness of breath at this time.  MEDICAL HISTORY:  Past Medical History  Diagnosis Date  . Asthma   . Hypertension   . Shortness of breath   . Coronary artery disease   . Stroke ([place])     hx TIA  . Arthritis   . Diabetes mellitus     type 2, insulin dependent for 30+ years  . History of kidney stones 11/2012  . IDDM (insulin dependent  diabetes mellitus) ([place])   . Hyperlipemia     SURGICAL HISTORY: Past Surgical History  Procedure Laterality Date  . Hernia repair      x2  . Angioplasty  04/06/2011    x3   . Cholecystectomy    . Cataracts    . Cardiac catheterization    . Left heart catheterization with coronary angiogram N/A 03/08/2011    Procedure: LEFT HEART CATHETERIZATION WITH CORONARY ANGIOGRAM;  Surgeon: Laverda Page, MD;  Location: [facility] CATH LAB;  Service: Cardiovascular;  Laterality: N/A;  . Percutaneous coronary stent intervention (pci-s) N/A 04/06/2011    Procedure: PERCUTANEOUS CORONARY STENT INTERVENTION (PCI-S);  Surgeon: Laverda Page, MD;  Location: [facility] CATH LAB;  Service: Cardiovascular;  Laterality: N/A;  . Cardiac catheterization N/A 09/16/2015    Procedure: Left Heart Cath and Coronary Angiography;  Surgeon: Adrian Prows, MD;  Location: [facility] CV LAB;  Service: Cardiovascular;  Laterality: N/A;    SOCIAL HISTORY: Social History   Social History  . Marital Status: Married    Spouse Name: Vaughan Basta  . Number of Children: 2  . Years of Education: College   Occupational History  . retired     Multimedia programmer   Social History Main Topics  . Smoking status: Never Smoker   . Smokeless tobacco: Never Used  . Alcohol Use: Yes     Comment: occasionally  . Drug Use: No  . Sexual Activity: Yes   Other Topics Concern  .  Not on file   Social History Narrative   Patient lives at home with his spouse.   Caffeine Use: occasionally    FAMILY HISTORY: Family History  Problem Relation Age of Onset  . Emphysema Mother   . COPD Mother   . Asthma Paternal Aunt   . Heart attack Paternal Grandfather   . Alzheimer's disease Father     ALLERGIES:  is allergic to avandia.  MEDICATIONS:  Current Outpatient Prescriptions  Medication Sig Dispense Refill  . Ascorbic Acid (VITAMIN C) 1000 MG tablet Take 1,000 mg by mouth daily.     [name] aspirin EC 81 MG tablet Take 81 mg by mouth at bedtime.     . Canagliflozin (INVOKANA) 100 MG TABS Take 100 mg by mouth daily.     . carvedilol (COREG) 12.5 MG tablet Take 12.5 mg by mouth 2 (two) times daily with a meal.     . Cholecalciferol (VITAMIN D) 2000 units CAPS Take 2,000 Units by mouth daily.    [name] CINNAMON PO Take 2,000 mg by mouth 2 (two) times daily.    . clopidogrel (PLAVIX) 75 MG tablet Take 75 mg by mouth daily.    . DULoxetine (CYMBALTA) 30 MG capsule Take 30 mg by mouth at bedtime.     [name] ibuprofen (ADVIL,MOTRIN) 200 MG tablet Take 400-600 mg by mouth 2 (two) times daily.    . insulin NPH (HUMULIN N,NOVOLIN N) 100 UNIT/ML injection Inject 10-25 Units into the skin See admin instructions. Inject 10 units every morning and inject 25 units every night.    . insulin regular (NOVOLIN R,HUMULIN R) 100 units/mL injection Inject 20-25 Units into the skin See admin instructions. Inject 20 units every morning and inject 25 units every night.    . metFORMIN (GLUCOPHAGE-XR) 500 MG 24 hr tablet Take 2 tablets (1,000 mg total) by mouth 2 (two) times daily.    . methocarbamol (ROBAXIN) 500 MG tablet Take 500 mg by mouth 2 (two) times daily as needed (for muscle spasm).    . rosuvastatin (CRESTOR) 20 MG tablet Take 10 mg by mouth at bedtime.     . sulfaSALAzine (AZULFIDINE) 500 MG EC tablet Take 1,000 mg by mouth 2 (two) times daily.  2  . traMADol (ULTRAM) 50 MG tablet Take 50 mg by mouth every 6 (six) hours as needed for pain.    . valsartan (DIOVAN) 160 MG tablet Take 80 mg by mouth daily.   1   No current facility-administered medications for this visit.    REVIEW OF SYSTEMS:    10 Point review of Systems was done is negative except as noted above.  PHYSICAL EXAMINATION: ECOG PERFORMANCE STATUS: 2 - Symptomatic, <50% confined to bed  . Filed Vitals:   09/22/15 1357  BP: 158/83  Pulse: 69  Temp: 98.2 F (36.8 C)  Resp: 18   Filed Weights   09/22/15 1357  Weight: 217 lb 9.6 oz (98.703 kg)   .Body mass index is 31.22  kg/(m^2).  GENERAL:alert, in no acute distress and comfortable SKIN: skin color, texture, turgor are normal, no rashes or significant lesions EYES: normal, conjunctiva are pink and non-injected, sclera clear OROPHARYNX:no exudate, no erythema and lips, buccal mucosa, and tongue normal  NECK: supple, no JVD, thyroid normal size, non-tender, without nodularity LYMPH:  no palpable lymphadenopathy in the cervical, axillary or inguinal LUNGS: clear to auscultation with normal respiratory effort HEART: regular rate & rhythm,  no murmurs and no lower extremity edema ABDOMEN: abdomen soft,  non-tender, normoactive bowel sounds  Musculoskeletal: mild synovitis in his hands and knees. PSYCH: alert & oriented x 3 with fluent speech NEURO: no focal motor/sensory deficits  LABORATORY DATA:  I have reviewed the data as listed  . CBC Latest Ref Rng 09/22/2015 10/31/2013 04/07/2011  WBC 4.0 - 10.3 10e3/uL 6.4 9.1 8.6  Hemoglobin 13.0 - 17.1 g/dL 15.9 15.4 14.6  Hematocrit 38.4 - 49.9 % 46.4 45.9 41.8  Platelets 140 - 400 10e3/uL 169 137(L) 144(L)    . CMP Latest Ref Rng 09/22/2015 10/31/2013 04/07/2011  Glucose 70 - 140 mg/dl 75 175(H) 71  BUN 7.0 - 26.0 mg/dL 15.8 10 12   Creatinine 0.7 - 1.3 mg/dL 0.8 0.68 0.74  Sodium 136 - 145 mEq/L 140 143 141  Potassium 3.5 - 5.1 mEq/L 3.9 4.6 3.8  Chloride 96 - 112 mEq/L - 107 104  CO2 22 - 29 mEq/L 27 23 30   Calcium 8.4 - 10.4 mg/dL 9.0 9.0 9.3  Total Protein 6.4 - 8.3 g/dL 6.9 6.7 -  Total Bilirubin 0.20 - 1.20 mg/dL 0.47 0.3 -  Alkaline Phos 40 - 150 U/L 77 103 -  AST 5 - 34 U/L 18 26 -  ALT 0 - 55 U/L 16 16 -   Component     Latest Ref Rng 09/22/2015  Ig Kappa Free Light Chain     3.3 - 19.4 mg/L 11.9  Ig Lambda Free Light Chain     5.7 - 26.3 mg/L 14.7  Kappa/Lambda FluidC Ratio     0.26 - 1.65 0.81  Sed Rate     0 - 30 mm/hr 2  LDH     125 - 245 U/L 166  Beta 2     0.6 - 2.4 mg/L 1.9  RA Latex Turbid.     0.0 - 13.9 IU/mL 10.3  HIV      Non Reactive Non Reactive  Hep C Virus Ab     0.0 - 0.9 s/co ratio <0.1  CCP Antibodies IgG/IgA     0 - 19 units 9     RADIOGRAPHIC STUDIES: I have personally reviewed the radiological images as listed and agreed with the findings in the report. Dg Bone Survey Met  09/22/2015  CLINICAL DATA:  Staging myeloma. History of hypertension, diabetes and asthma. EXAM: METASTATIC BONE SURVEY COMPARISON:  None. FINDINGS: Plain films of the skull, chest, bilateral upper extremities, entire spine, osseous pelvis and bilateral lower extremities are provided. No acute or suspicious osseous finding. No lytic or destructive osseous lesion. Mild degenerative spurring noted throughout the spine. Atherosclerotic calcifications noted within the soft tissues of the lower extremities and along the walls of the infrarenal abdominal aorta. Heart size is normal. Lungs are clear. IMPRESSION: No lytic or destructive osseous lesions. Aortic atherosclerosis. Electronically Signed   By: Franki Cabot M.D.   On: 09/22/2015 18:54    ASSESSMENT & PLAN:   65 year old Caucasian male with multiple medical comorbidities as noted above with newly diagnosed inflammatory arthritis and fatigue with workup labs showing small M spike on SPEP.  1) Monoclonal gammopathy of undetermined significance Recent SPEP with his rheumatologist showed 2 bands of M protein measuring 0.3 g/dL and 0.1 g/dL. Serum kappa lambda free light chains - unimpressive Whole-body skeletal survey showed no lytic or destructive washes lesions Labs not show any anemia, renal failure or hypercalcemia. Immunofixation electrophoresis is pending to determine type of M spike HIV negative, hepatitis C negative Patient's MGUS is likely related to his underlying inflammatory  arthritis and seems to be less likely related to multiple myeloma or an overt plasma cell dyscrasia at this time. Plan -We will followup on repeat myeloma panel with a immunofixation  electrophoresis. -after discussing with the patient decided to hold off on a bone marrow examination at this time. -24-hour UPEP pending. -we will see the patient back in clinic in 55monthto go over all this workup for ruling out a plasma cell dyscrasia. -he was given patient education materials from the [employer]arding her MGUS and plasma cell dyscrasias.  2) inflammatory arthritis and osteoarthritis - rheumatoid factor and anti-CCP antibody negative. -Workup and management as per rheumatology/Dr Deveshwar.  3)  Continue followup with primary care physician and cardiologist for management of other medical comorbidities.   . Orders Placed This Encounter  Procedures  . DG Bone Survey Met    Standing Status: Future     Number of Occurrences: 1     Standing Expiration Date: 11/21/2016    Order Specific Question:  Reason for Exam (SYMPTOM  OR DIAGNOSIS REQUIRED)    Answer:  staging myeloma    Order Specific Question:  [facility]    [facility]    Standing Status: Future     Number of Occurrences: 1     Standing Expiration Date: 09/21/2016  . Comprehensive metabolic panel    Standing Status: Future     Number of Occurrences: 1     Standing Expiration Date: 09/21/2016  . Sedimentation rate    Standing Status: Future     Number of Occurrences: 1     Standing Expiration Date: 09/21/2016  . Lactate dehydrogenase (LDH)    Standing Status: Future     Number of Occurrences: 1     Standing Expiration Date: 09/21/2016  . Multiple Myeloma Panel (SPEP&IFE w/QIG)    Standing Status: Future     Number of Occurrences: 1     Standing Expiration Date: 09/21/2016  . Kappa/lambda light chains    Standing Status: Future     Number of Occurrences: 1     Standing Expiration Date: 10/26/2016  . Beta 2 microglobulin, serum    Standing Status: Future     Number of Occurrences: 1     Standing Expiration Date: 10/26/2016  .  Rheumatoid factor    Standing Status: Future     Number of Occurrences: 1     Standing Expiration Date: 09/21/2016  . Other/Misc lab test    Standing Status: Future     Number of Occurrences: 1     Standing Expiration Date: 09/21/2016    Order Specific Question:  Test name / description:    Answer:  Cyclic Citrullinated peptide IgG (CCP Ab for RA)  . HIV antibody (with reflex)    Standing Status: Future     Number of Occurrences: 1     Standing Expiration Date: 09/21/2016  . Hepatitis C antibody    Standing Status: Future     Number of Occurrences: 1     Standing Expiration Date: 09/21/2016  . 24 Hr Ur UPEP/TP    Standing Status: Future     Number of Occurrences: 1     Standing Expiration Date: 09/21/2016     All of the patients questions were answered with apparent satisfaction. The patient knows to call the clinic with any problems, questions or concerns.  I spent 55 minutes counseling the patient face to face.  The total time spent in the appointment was 60 minutes and more than 50% was on counseling and direct patient cares.    Sullivan Lone MD [place] AAHIVMS [facility] Hematology/Oncology Physician [facility]  (Office):       [phone] (Work cell):  [phone] (Fax):           [phone]

## 2015-09-26 LAB — UPEP/TP, 24-HR URINE
Albumin, U: 31.8 %
Alpha-1-Globulin, U: 6.2 %
Alpha-2-Globulin, U: 15 %
BETA GLOBULIN, U: 29.1 %
GAMMA GLOBULIN, U: 18 %
PROTEIN 24H UR: 304 mg/(24.h) — AB (ref 30–150)
PROTEIN,TOTAL,URINE: 15.2 mg/dL

## 2015-10-01 ENCOUNTER — Telehealth: Payer: Self-pay | Admitting: Hematology

## 2015-10-01 NOTE — Telephone Encounter (Signed)
Appointment confirmed with patient. Alan Wang °

## 2015-10-20 ENCOUNTER — Ambulatory Visit: Payer: BLUE CROSS/BLUE SHIELD | Admitting: Hematology

## 2015-10-20 ENCOUNTER — Other Ambulatory Visit: Payer: BLUE CROSS/BLUE SHIELD

## 2015-10-28 ENCOUNTER — Telehealth: Payer: Self-pay | Admitting: Hematology

## 2015-10-28 NOTE — Telephone Encounter (Signed)
DUE TO DR Irene Limbo COVERING AP SPOKE WITH PATIENT WIFE RE RESCHEDULING 8/23 LAB/FU TO 8/30. BOTH WIFE AND PATIENT EXPRESSED CONCERNS RE MULTIPLE PREVIOUS RESCHEDULES AND NOT WANTING TO WAIT AND FURTHER FOR LAB/XRAY RESULTS FROM 7/17. PATIENT CONCERNS FORWARDED TO DESK NURSE WHO SPOKE WITH DR Irene Limbo. PER DESK NURSE DR KALE WILL CONTACT PATIENT RE RESULTS.

## 2015-10-29 ENCOUNTER — Telehealth: Payer: Self-pay | Admitting: *Deleted

## 2015-10-29 ENCOUNTER — Ambulatory Visit: Payer: BLUE CROSS/BLUE SHIELD | Admitting: Hematology

## 2015-10-29 ENCOUNTER — Encounter: Payer: Self-pay | Admitting: Hematology and Oncology

## 2015-10-29 ENCOUNTER — Other Ambulatory Visit: Payer: BLUE CROSS/BLUE SHIELD

## 2015-10-29 ENCOUNTER — Ambulatory Visit (HOSPITAL_BASED_OUTPATIENT_CLINIC_OR_DEPARTMENT_OTHER): Payer: BLUE CROSS/BLUE SHIELD | Admitting: Hematology and Oncology

## 2015-10-29 DIAGNOSIS — E559 Vitamin D deficiency, unspecified: Secondary | ICD-10-CM | POA: Diagnosis not present

## 2015-10-29 DIAGNOSIS — D472 Monoclonal gammopathy: Secondary | ICD-10-CM | POA: Diagnosis not present

## 2015-10-29 DIAGNOSIS — M1990 Unspecified osteoarthritis, unspecified site: Secondary | ICD-10-CM | POA: Diagnosis not present

## 2015-10-29 NOTE — Telephone Encounter (Signed)
Faxed letter from Dr. Alvy Bimler today to Dr. Cy Blamer at fax [phone].

## 2015-10-29 NOTE — Progress Notes (Signed)
[facility] FOLLOW-UP progress notes  Patient Care Team: Aura Dials, MD as PCP - General (Family Medicine) Bo Merino, MD as Consulting Physician (Rheumatology)  CHIEF COMPLAINTS/PURPOSE OF VISIT:  Abnormal serum protein electrophoresis  HISTORY OF PRESENTING ILLNESS:  Alan Wang 65 y.o. male was transferred to my care per patient request.  I reviewed the patient's records extensive and collaborated the history with the patient. Summary of his history is as follows: This patient had chronic arthritis pain. He was referred to rheumatologist and underwent knee procedure and local injection to his left knee. As part of the workup for bone pain/arthritis, his rheumatologist ordered serum protein electrophoresis around March 2017 that came back biclonal abnormality with IgG and lambda specificity.  ANA test was positive with speckled pattern. He was also noted to have vitamin D deficiency. He was hence referred here for further evaluation for evaluation of MGUS Last month, he underwent full workup including blood tests, 24-hour urine collection and skeletal survey. The patient returned today to review test results  MEDICAL HISTORY:  Past Medical History:  Diagnosis Date  . Arthritis   . Asthma   . Coronary artery disease   . Diabetes mellitus    type 2, insulin dependent for 30+ years  . History of kidney stones 11/2012  . Hyperlipemia   . Hypertension   . IDDM (insulin dependent diabetes mellitus) ([place])   . Shortness of breath   . Stroke ([place])    hx TIA    SURGICAL HISTORY: Past Surgical History:  Procedure Laterality Date  . ANGIOPLASTY  04/06/2011   x3   . CARDIAC CATHETERIZATION    . CARDIAC CATHETERIZATION N/A 09/16/2015   Procedure: Left Heart Cath and Coronary Angiography;  Surgeon: Adrian Prows, MD;  Location: [facility] CV LAB;  Service: Cardiovascular;  Laterality: N/A;  . cataracts    . CHOLECYSTECTOMY    . HERNIA REPAIR     x2  . LEFT  HEART CATHETERIZATION WITH CORONARY ANGIOGRAM N/A 03/08/2011   Procedure: LEFT HEART CATHETERIZATION WITH CORONARY ANGIOGRAM;  Surgeon: Laverda Page, MD;  Location: [facility] CATH LAB;  Service: Cardiovascular;  Laterality: N/A;  . PERCUTANEOUS CORONARY STENT INTERVENTION (PCI-S) N/A 04/06/2011   Procedure: PERCUTANEOUS CORONARY STENT INTERVENTION (PCI-S);  Surgeon: Laverda Page, MD;  Location: [facility] CATH LAB;  Service: Cardiovascular;  Laterality: N/A;    SOCIAL HISTORY: Social History   Social History  . Marital status: Married    Spouse name: Vaughan Basta  . Number of children: 2  . Years of education: College   Occupational History  . retired     Multimedia programmer   Social History Main Topics  . Smoking status: Never Smoker  . Smokeless tobacco: Never Used  . Alcohol use Yes     Comment: occasionally  . Drug use: No  . Sexual activity: Yes   Other Topics Concern  . Not on file   Social History Narrative   Patient lives at home with his spouse.   Caffeine Use: occasionally    FAMILY HISTORY: Family History  Problem Relation Age of Onset  . Emphysema Mother   . COPD Mother   . Alzheimer's disease Father   . Asthma Paternal Aunt   . Heart attack Paternal Grandfather     ALLERGIES:  is allergic to avandia [rosiglitazone maleate].  MEDICATIONS:  Current Outpatient Prescriptions  Medication Sig Dispense Refill  . Ascorbic Acid (VITAMIN C) 1000 MG tablet Take 1,000 mg by mouth  daily.     . aspirin EC 81 MG tablet Take 81 mg by mouth at bedtime.    . Canagliflozin (INVOKANA) 100 MG TABS Take 100 mg by mouth daily.     . carvedilol (COREG) 12.5 MG tablet Take 12.5 mg by mouth 2 (two) times daily with a meal.     . Cholecalciferol (VITAMIN D) 2000 units CAPS Take 2,000 Units by mouth daily.    [name] CINNAMON PO Take 2,000 mg by mouth 2 (two) times daily.    . clopidogrel (PLAVIX) 75 MG tablet Take 75 mg by mouth daily.    . DULoxetine (CYMBALTA) 30 MG capsule Take 30 mg by  mouth at bedtime.     [name] ibuprofen (ADVIL,MOTRIN) 200 MG tablet Take 600 mg by mouth 2 (two) times daily.     . insulin NPH (HUMULIN N,NOVOLIN N) 100 UNIT/ML injection Inject 10-25 Units into the skin See admin instructions. Inject 10 units every morning and inject 25 units every night.    . insulin regular (NOVOLIN R,HUMULIN R) 100 units/mL injection Inject 20-25 Units into the skin See admin instructions. Inject 20 units every morning and inject 25 units every night.    . metFORMIN (GLUCOPHAGE-XR) 500 MG 24 hr tablet Take 2 tablets (1,000 mg total) by mouth 2 (two) times daily.    . methocarbamol (ROBAXIN) 500 MG tablet Take 500 mg by mouth 2 (two) times daily as needed (for muscle spasm).    . rosuvastatin (CRESTOR) 20 MG tablet Take 10 mg by mouth at bedtime.     . sulfaSALAzine (AZULFIDINE) 500 MG EC tablet Take 1,000 mg by mouth 2 (two) times daily.  2  . traMADol (ULTRAM) 50 MG tablet Take 50 mg by mouth every 6 (six) hours as needed for pain.    . valsartan (DIOVAN) 160 MG tablet Take 80 mg by mouth daily.   1   No current facility-administered medications for this visit.     REVIEW OF SYSTEMS:   Constitutional: Denies fevers, chills or abnormal night sweats Eyes: Denies blurriness of vision, double vision or watery eyes Ears, nose, mouth, throat, and face: Denies mucositis or sore throat Respiratory: Denies cough, dyspnea or wheezes Cardiovascular: Denies palpitation, chest discomfort or lower extremity swelling Gastrointestinal:  Denies nausea, heartburn or change in bowel habits Skin: Denies abnormal skin rashes Lymphatics: Denies new lymphadenopathy or easy bruising Neurological:Denies numbness, tingling or new weaknesses Behavioral/Psych: Mood is stable, no new changes  All other systems were reviewed with the patient and are negative.  PHYSICAL EXAMINATION: ECOG PERFORMANCE STATUS: 0 - Asymptomatic  Vitals:   10/29/15 1145  BP: (!) 146/69  Pulse: 77  Resp: 18  Temp:  98.6 F (37 C)   Filed Weights   10/29/15 1145  Weight: 215 lb (97.5 kg)    GENERAL:alert, no distress and comfortable SKIN: skin color, texture, turgor are normal, no rashes or significant lesions EYES: normal, conjunctiva are pink and non-injected, sclera clear PSYCH: alert & oriented x 3 with fluent speech NEURO: no focal motor/sensory deficits  LABORATORY DATA:  I have reviewed the data as listed Lab Results  Component Value Date   WBC 6.4 09/22/2015   HGB 15.9 09/22/2015   HCT 46.4 09/22/2015   MCV 92.1 09/22/2015   PLT 169 09/22/2015    Recent Labs  09/22/15 1525 09/22/15 1526  NA  --  140  K  --  3.9  CO2  --  27  GLUCOSE  --  75  BUN  --  15.8  CREATININE  --  0.8  CALCIUM  --  9.0  PROT 6.4 6.9  ALBUMIN  --  3.7  AST  --  18  ALT  --  16  ALKPHOS  --  77  BILITOT  --  0.47    RADIOGRAPHIC STUDIES:I reviewed the skeletal survey I have personally reviewed the radiological images as listed and agreed with the findings in the report.   ASSESSMENT & PLAN:  MGUS (monoclonal gammopathy of unknown significance) The patient was originally referred here because of detectable abnormal serum protein electrophoresis. Since then, repeat serum protein electrophoresis with immunofixation, free light chains, CBC with CMP, skeletal survey and 24-hour urine collection did not detect abnormal monoclonal paraproteinemia.  He has no signs of anemia, hypercalcemia or renal failure. His symptoms of bone pain/arthritis are unrelated We discussed potential laboratory differences with test results. He received 1 dose of corticosteroid injection several months ago that could have normalized his blood counts. In any case, I reassured the patient that he does not meet the criteria for MGUS. I recommend repeat blood work with his rheumatologist next year for confirmation whether he had persistent MGUS or not  Inflammatory arthritis ([place]) His recent repeat rheumatoid factor and  sedimentation rate were within normal limits. I will defer to his rheumatologist for further management  Vitamin D deficiency The patient is noted to have vitamin D deficiency. He has history of skin cancer and has been avoiding excessive sun exposure. I recommend vitamin D supplement 2000 units daily   No orders of the defined types were placed in this encounter.   All questions were answered. The patient knows to call the clinic with any problems, questions or concerns. I spent 15 minutes counseling the patient face to face. The total time spent in the appointment was 20 minutes and more than 50% was on counseling.     [facility], [place], MD 10/29/2015 12:40 PM

## 2015-10-29 NOTE — Assessment & Plan Note (Signed)
The patient is noted to have vitamin D deficiency. He has history of skin cancer and has been avoiding excessive sun exposure. I recommend vitamin D supplement 2000 units daily

## 2015-10-29 NOTE — Assessment & Plan Note (Signed)
The patient was originally referred here because of detectable abnormal serum protein electrophoresis. Since then, repeat serum protein electrophoresis with immunofixation, free light chains, CBC with CMP, skeletal survey and 24-hour urine collection did not detect abnormal monoclonal paraproteinemia.  He has no signs of anemia, hypercalcemia or renal failure. His symptoms of bone pain/arthritis are unrelated We discussed potential laboratory differences with test results. He received 1 dose of corticosteroid injection several months ago that could have normalized his blood counts. In any case, I reassured the patient that he does not meet the criteria for MGUS. I recommend repeat blood work with his rheumatologist next year for confirmation whether he had persistent MGUS or not

## 2015-10-29 NOTE — Assessment & Plan Note (Signed)
His recent repeat rheumatoid factor and sedimentation rate were within normal limits. I will defer to his rheumatologist for further management

## 2015-10-29 NOTE — Telephone Encounter (Signed)
Call from spouse in regards to lab results and F/U appointment.  Call transferred to ext [phone] per her request.  Would like "return call today or coming to [facility] later today."

## 2015-11-11 ENCOUNTER — Telehealth: Payer: Self-pay | Admitting: Radiology

## 2015-11-11 DIAGNOSIS — R748 Abnormal levels of other serum enzymes: Secondary | ICD-10-CM

## 2015-11-26 NOTE — Telephone Encounter (Signed)
error 

## 2015-12-13 ENCOUNTER — Other Ambulatory Visit: Payer: Self-pay | Admitting: Radiology

## 2015-12-13 DIAGNOSIS — Z79899 Other long term (current) drug therapy: Secondary | ICD-10-CM

## 2015-12-16 ENCOUNTER — Ambulatory Visit (INDEPENDENT_AMBULATORY_CARE_PROVIDER_SITE_OTHER): Payer: BLUE CROSS/BLUE SHIELD | Admitting: Rheumatology

## 2015-12-16 DIAGNOSIS — M19042 Primary osteoarthritis, left hand: Secondary | ICD-10-CM | POA: Diagnosis not present

## 2015-12-16 DIAGNOSIS — M19041 Primary osteoarthritis, right hand: Secondary | ICD-10-CM | POA: Diagnosis not present

## 2015-12-16 DIAGNOSIS — M791 Myalgia: Secondary | ICD-10-CM | POA: Diagnosis not present

## 2015-12-16 DIAGNOSIS — M1712 Unilateral primary osteoarthritis, left knee: Secondary | ICD-10-CM | POA: Diagnosis not present

## 2015-12-16 DIAGNOSIS — M1711 Unilateral primary osteoarthritis, right knee: Secondary | ICD-10-CM | POA: Diagnosis not present

## 2016-03-04 ENCOUNTER — Other Ambulatory Visit: Payer: Self-pay | Admitting: Rheumatology

## 2016-03-04 NOTE — Telephone Encounter (Signed)
Last Visit: 12/16/15 Next Visit due in March 2018 Message sent to the front to schedule patient.  Labs: 12/05/15 WNL Patient coming tomorrow to update labs  Okay to refill SSZ?

## 2016-03-05 ENCOUNTER — Other Ambulatory Visit: Payer: Self-pay | Admitting: Radiology

## 2016-03-05 ENCOUNTER — Telehealth: Payer: Self-pay | Admitting: Rheumatology

## 2016-03-05 DIAGNOSIS — Z79899 Other long term (current) drug therapy: Secondary | ICD-10-CM

## 2016-03-05 LAB — CBC WITH DIFFERENTIAL/PLATELET
BASOS ABS: 0 {cells}/uL (ref 0–200)
Basophils Relative: 0 %
EOS ABS: 207 {cells}/uL (ref 15–500)
Eosinophils Relative: 3 %
HEMATOCRIT: 49.2 % (ref 38.5–50.0)
HEMOGLOBIN: 16.3 g/dL (ref 13.2–17.1)
LYMPHS ABS: 2829 {cells}/uL (ref 850–3900)
Lymphocytes Relative: 41 %
MCH: 31.3 pg (ref 27.0–33.0)
MCHC: 33.1 g/dL (ref 32.0–36.0)
MCV: 94.6 fL (ref 80.0–100.0)
MONOS PCT: 13 %
MPV: 9.3 fL (ref 7.5–12.5)
Monocytes Absolute: 897 cells/uL (ref 200–950)
NEUTROS ABS: 2967 {cells}/uL (ref 1500–7800)
Neutrophils Relative %: 43 %
Platelets: 173 10*3/uL (ref 140–400)
RBC: 5.2 MIL/uL (ref 4.20–5.80)
RDW: 13.9 % (ref 11.0–15.0)
WBC: 6.9 10*3/uL (ref 3.8–10.8)

## 2016-03-05 LAB — COMPLETE METABOLIC PANEL WITH GFR
ALBUMIN: 4.3 g/dL (ref 3.6–5.1)
ALK PHOS: 73 U/L (ref 40–115)
ALT: 16 U/L (ref 9–46)
AST: 20 U/L (ref 10–35)
BILIRUBIN TOTAL: 0.4 mg/dL (ref 0.2–1.2)
BUN: 19 mg/dL (ref 7–25)
CALCIUM: 9.4 mg/dL (ref 8.6–10.3)
CO2: 24 mmol/L (ref 20–31)
Chloride: 105 mmol/L (ref 98–110)
Creat: 0.8 mg/dL (ref 0.70–1.25)
Glucose, Bld: 126 mg/dL — ABNORMAL HIGH (ref 65–99)
Potassium: 4.2 mmol/L (ref 3.5–5.3)
Sodium: 140 mmol/L (ref 135–146)
TOTAL PROTEIN: 6.5 g/dL (ref 6.1–8.1)

## 2016-03-05 NOTE — Telephone Encounter (Signed)
LMOM for patient to call back to schedule appointment.  

## 2016-03-05 NOTE — Telephone Encounter (Signed)
-----   Message from Carole Binning, LPN sent at [id number] 10:37 AM EST ----- Regarding: Please schedule follow up visit Please schedule follow up visit. Patient due March 2018. Thanks!

## 2016-03-07 NOTE — Progress Notes (Signed)
CBC normal

## 2016-03-07 NOTE — Progress Notes (Signed)
Labs normal.

## 2016-03-09 ENCOUNTER — Telehealth: Payer: Self-pay | Admitting: Radiology

## 2016-03-09 NOTE — Telephone Encounter (Signed)
-----   Message from Bo Merino, MD sent at 03/07/2016  7:24 PM EST ----- Labs normal

## 2016-03-09 NOTE — Telephone Encounter (Signed)
I have called patient to advise labs are normal  

## 2016-05-17 ENCOUNTER — Ambulatory Visit: Payer: BLUE CROSS/BLUE SHIELD | Admitting: Rheumatology

## 2016-06-03 DIAGNOSIS — M170 Bilateral primary osteoarthritis of knee: Secondary | ICD-10-CM | POA: Insufficient documentation

## 2016-06-03 DIAGNOSIS — Z8679 Personal history of other diseases of the circulatory system: Secondary | ICD-10-CM | POA: Insufficient documentation

## 2016-06-03 DIAGNOSIS — Z8639 Personal history of other endocrine, nutritional and metabolic disease: Secondary | ICD-10-CM | POA: Insufficient documentation

## 2016-06-03 DIAGNOSIS — R52 Pain, unspecified: Secondary | ICD-10-CM | POA: Insufficient documentation

## 2016-06-03 DIAGNOSIS — R413 Other amnesia: Secondary | ICD-10-CM | POA: Insufficient documentation

## 2016-06-03 DIAGNOSIS — M19042 Primary osteoarthritis, left hand: Secondary | ICD-10-CM

## 2016-06-03 DIAGNOSIS — M19041 Primary osteoarthritis, right hand: Secondary | ICD-10-CM | POA: Insufficient documentation

## 2016-06-03 DIAGNOSIS — Z79899 Other long term (current) drug therapy: Secondary | ICD-10-CM | POA: Insufficient documentation

## 2016-06-03 DIAGNOSIS — R768 Other specified abnormal immunological findings in serum: Secondary | ICD-10-CM | POA: Insufficient documentation

## 2016-06-03 NOTE — Progress Notes (Signed)
Office Visit Note  Patient: Alan Wang             Date of Birth: June 08, 1950           MRN: [id number]             PCP: Cammy Copa, MD Referring: Aura Dials, MD Visit Date: 06/08/2016 Occupation: @GUAROCC @    Subjective:  Follow-up; Extremity Weakness (legs feel weak ); and Fatigue   History of Present Illness: Alan Wang is a 66 y.o. male  Doing well with inflammatory arthritis. Taking sulfasalazine 500 mg 2 pills twice a day. Occasional joint pain in hands but lasts for a short period of time.  Recently went on vacation to [place] and found that walking was more troublesome.  He is requesting Visco supplementation. The last time he had Visco was Euflexxa about 2 years ago and did really well.  Patient is also taking tramadol pain medication secondary to knee pain and joint pain.   Activities of Daily Living:  Patient reports morning stiffness for 15 minutes.   Patient Reports nocturnal pain.  Difficulty dressing/grooming: Denies Difficulty climbing stairs: Reports Difficulty getting out of chair: Reports Difficulty using hands for taps, buttons, cutlery, and/or writing: Reports   Review of Systems  Constitutional: Negative for fatigue.  HENT: Negative for mouth sores and mouth dryness.   Eyes: Negative for dryness.  Respiratory: Negative for shortness of breath.   Gastrointestinal: Negative for constipation and diarrhea.  Musculoskeletal: Negative for myalgias and myalgias.  Skin: Negative for sensitivity to sunlight.  Neurological: Negative for memory loss.  Psychiatric/Behavioral: Negative for sleep disturbance.    PMFS History:  Patient Active Problem List   Diagnosis Date Noted  . Primary osteoarthritis of both hands 06/03/2016  . Primary osteoarthritis of both knees 06/03/2016  . Generalized pain 06/03/2016  . Memory difficulties 06/03/2016  . History of diabetes mellitus 06/03/2016  . History of coronary artery disease 06/03/2016    . History of hypertension 06/03/2016  . History of hypercholesterolemia 06/03/2016  . High risk medication use 06/03/2016  . History of high cholesterol 06/03/2016  . ANA positive 06/03/2016  . Inflammatory arthritis 10/29/2015  . Vitamin D deficiency 10/29/2015  . Fatigue 09/24/2015  . MGUS (monoclonal gammopathy of unknown significance) 09/22/2015  . Vertigo 10/31/2013  . Chronic cough 11/16/2012  . CAD (coronary artery disease), native coronary artery 03/08/2011  . Postsurgical percutaneous transluminal coronary angioplasty (PTCA) status 03/08/2011  . Type II or unspecified type diabetes mellitus with other coma, not stated as uncontrolled 03/08/2011  . Hypertension associated with diabetes ([place]) 03/08/2011  . Shortness of breath 03/08/2011  . Mixed hyperlipidemia 03/08/2011    Past Medical History:  Diagnosis Date  . Arthritis   . Asthma   . Coronary artery disease   . Diabetes mellitus    type 2, insulin dependent for 30+ years  . History of kidney stones 11/2012  . Hyperlipemia   . Hypertension   . IDDM (insulin dependent diabetes mellitus) ([place])   . Shortness of breath   . Stroke ([place])    hx TIA    Family History  Problem Relation Age of Onset  . Emphysema Mother   . COPD Mother   . Alzheimer's disease Father   . Asthma Paternal Aunt   . Heart attack Paternal Grandfather    Past Surgical History:  Procedure Laterality Date  . ANGIOPLASTY  04/06/2011   x3   . CARDIAC CATHETERIZATION    . CARDIAC  CATHETERIZATION N/A 09/16/2015   Procedure: Left Heart Cath and Coronary Angiography;  Surgeon: Adrian Prows, MD;  Location: [facility];  Service: Cardiovascular;  Laterality: N/A;  . cataracts    . CHOLECYSTECTOMY    . HERNIA REPAIR     x2  . LEFT HEART CATHETERIZATION WITH CORONARY ANGIOGRAM N/A 03/08/2011   Procedure: LEFT HEART CATHETERIZATION WITH CORONARY ANGIOGRAM;  Surgeon: Laverda Page, MD;  Location: [facility] C[facility] LAB;  Service: Cardiovascular;   Laterality: N/A;  . PERCUTANEOUS CORONARY STENT INTERVENTION (PCI-S) N/A 04/06/2011   Procedure: PERCUTANEOUS CORONARY STENT INTERVENTION (PCI-S);  Surgeon: Laverda Page, MD;  Location: [facility] CATH LAB;  Service: Cardiovascular;  Laterality: N/A;   Social History   Social History Narrative   Patient lives at home with his spouse.   Caffeine Use: occasionally     Objective: Vital Signs: BP (!) 150/80   Pulse 76   Resp 14   Ht 5' 10"  (1.778 m)   Wt 214 lb (97.1 kg)   BMI 30.71 kg/m    Physical Exam  Constitutional: He is oriented to person, place, and time. He appears well-developed and well-nourished.  HENT:  Head: Normocephalic and atraumatic.  Eyes: Conjunctivae and EOM are normal. Pupils are equal, round, and reactive to light.  Neck: Normal range of motion. Neck supple.  Cardiovascular: Normal rate, regular rhythm and normal heart sounds.  Exam reveals no gallop and no friction rub.   No murmur heard. Pulmonary/Chest: Effort normal and breath sounds normal. No respiratory distress. He has no wheezes. He has no rales. He exhibits no tenderness.  Abdominal: Soft. He exhibits no distension and no mass. There is no tenderness. There is no guarding.  Musculoskeletal: Normal range of motion.  Lymphadenopathy:    He has no cervical adenopathy.  Neurological: He is alert and oriented to person, place, and time. He exhibits normal muscle tone. Coordination normal.  Skin: Skin is warm and dry. Capillary refill takes less than 2 seconds. No rash noted.  Psychiatric: He has a normal mood and affect. His behavior is normal. Judgment and thought content normal.  Vitals reviewed.    Musculoskeletal Exam:  Full range of motion of all joints Grip strength is equal and strong bilaterally Fibromyalgia tender points are all absent  CDAI Exam: CDAI Homunculus Exam:   Joint Counts:  CDAI Tender Joint count: 0 CDAI Swollen Joint count: 0  Global Assessments:  Patient Global  Assessment: 6 Provider Global Assessment: 6  CDAI Calculated Score: 12  No synovitis on examination  Investigation: Findings:  September 2017:  CBC was normal, comprehensive metabolic panel showed glucose of 147 hemoglobin A1c was 5.4 per patient's PCP  Lab on 03/05/2016  Component Date Value Ref Range Status  . Sodium 03/05/2016 140  135 - 146 mmol/L Final  . Potassium 03/05/2016 4.2  3.5 - 5.3 mmol/L Final  . Chloride 03/05/2016 105  98 - 110 mmol/L Final  . CO2 03/05/2016 24  20 - 31 mmol/L Final  . Glucose, Bld 03/05/2016 126* 65 - 99 mg/dL Final  . BUN 03/05/2016 19  7 - 25 mg/dL Final  . Creat 03/05/2016 0.80  0.70 - 1.25 mg/dL Final   Comment:   For patients > or = 68 years of age: The upper reference limit for Creatinine is approximately 13% higher for people identified as African-American.     . Total Bilirubin 03/05/2016 0.4  0.2 - 1.2 mg/dL Final  . Alkaline Phosphatase 03/05/2016 73  40 -  115 U/L Final  . AST 03/05/2016 20  10 - 35 U/L Final  . ALT 03/05/2016 16  9 - 46 U/L Final  . Total Protein 03/05/2016 6.5  6.1 - 8.1 g/dL Final  . Albumin 03/05/2016 4.3  3.6 - 5.1 g/dL Final  . Calcium 03/05/2016 9.4  8.6 - 10.3 mg/dL Final  . GFR, Est African American 03/05/2016 >89  >=60 mL/min Final  . GFR, Est Non African American 03/05/2016 >89  >=60 mL/min Final  Orders Only on 03/05/2016  Component Date Value Ref Range Status  . WBC 03/05/2016 6.9  3.8 - 10.8 K/uL Final  . RBC 03/05/2016 5.20  4.20 - 5.80 MIL/uL Final  . Hemoglobin 03/05/2016 16.3  13.2 - 17.1 g/dL Final  . HCT 03/05/2016 49.2  38.5 - 50.0 % Final  . MCV 03/05/2016 94.6  80.0 - 100.0 fL Final  . MCH 03/05/2016 31.3  27.0 - 33.0 pg Final  . MCHC 03/05/2016 33.1  32.0 - 36.0 g/dL Final  . RDW 03/05/2016 13.9  11.0 - 15.0 % Final  . Platelets 03/05/2016 173  140 - 400 K/uL Final  . MPV 03/05/2016 9.3  7.5 - 12.5 fL Final  . Neutro Abs 03/05/2016 2967  1,500 - 7,800 cells/uL Final  . Lymphs Abs  03/05/2016 2829  850 - 3,900 cells/uL Final  . Monocytes Absolute 03/05/2016 897  200 - 950 cells/uL Final  . Eosinophils Absolute 03/05/2016 207  15 - 500 cells/uL Final  . Basophils Absolute 03/05/2016 0  0 - 200 cells/uL Final  . Neutrophils Relative % 03/05/2016 43  % Final  . Lymphocytes Relative 03/05/2016 41  % Final  . Monocytes Relative 03/05/2016 13  % Final  . Eosinophils Relative 03/05/2016 3  % Final  . Basophils Relative 03/05/2016 0  % Final  . Smear Review 03/05/2016 Criteria for review not met   Final      Imaging: No results found.  Speciality Comments: No specialty comments available.    Procedures:  No procedures performed Allergies: Avandia [rosiglitazone maleate]   Assessment / Plan:     Visit Diagnoses: Inflammatory arthritis - seronegative  ANA positive  High risk medication use - SSZ-520m EC tablet TAKE TWO TABLETS BY MOUTH TWICE DAILY  Primary osteoarthritis of both hands  Primary osteoarthritis of both knees - has synovitis on the biopsy of his left knee joint.    Other fatigue  Generalized pain  Memory difficulties  History of diabetes mellitus  History of coronary artery disease - S/P Stent  History of hypertension    Plan: #1: Inflammatory arthritis. Patient is taking sulfasalazine EN 500 milligrams 2 pills twice a day. Doing relatively well. Has occasional flares. He does not recall what joints bother him but the pain feels like "lightning"/electric current" at times and needs a momentary discomfort.  Rarely misses the sulfasalazine.  #2: High-risk prescription. On sulfasalazine EN 500 mg 2 pills twice a day  #3: Bilateral knee joint discomfort. X-rays done 11/30/2011 shows bilateral moderate medial compartment narrowing and mild patellofemoral joint space narrowing. Patient has a history of effusion in the left knee but has not had that flare up on him after starting him on sulfasalazine. [place] also 83 and patient found it difficult to walk at times. "I did not have the strength I  used to to walk as I  did in the past."  Patient has heard about rooster comb injections and would like to start them if appropriate. Note that he has failed weight loss, failed Aleve (takes it regularly) and has had cortisone injection in the past. We will apply for Hyalgan for both knees but Euflexxa and Orthovisc are acceptable if his insurance, and he prefers those over Hyalgan.  #4: Update narcotic agreement #5: CBC with differential, CMP with GFR, urine drug screen today. #6: Refill tramadol 50 mg 1 pill daily at bedtime dispense 30 days supply with 2 refills note patient and the wife, Emanual Lamountain, feel that patient would benefit from getting 2 pills to take at night. This is the current dose. They're willing to try 1 pill a night and see if that will work but they will let me know if Mr. Bluett will need to go to 2 pills at night. I'll be happy to rewrite the prescription for 2 pills a night temporarily if he needs it in the future. #7: Return to clinic in 5 months  Orders: No orders of the defined types were placed in this encounter.  Meds ordered this encounter  Medications  . sulfaSALAzine (AZULFIDINE) 500 MG EC tablet    Sig: Take 2 tablets (1,000 mg total) by mouth 2 (two) times daily.    Dispense:  360 tablet    Refill:  1    Order Specific Question:   Supervising Provider    Answer:   Bo Merino [[id number]]  . traMADol (ULTRAM) 50 MG tablet    Sig: Take 1 tablet (50 mg total) by mouth at bedtime as needed.    Dispense:  30 tablet    Refill:  2    Order Specific Question:   Supervising Provider    Answer:   Bo Merino [phone]    Face-to-face time spent with patient was 30 minutes. 50% of time was spent in counseling and coordination of care.  Follow-Up Instructions: Return in about  5 months (around 11/08/2016) for infl arthritis, ssz en 1000 bid;  oa kj; bil knee pain.   Eliezer Lofts, PA-C  Note - This record has been created using [employer].  Chart creation errors have been sought, but may not always  have been located. Such creation errors do not reflect on  the standard of medical care.

## 2016-06-07 DIAGNOSIS — E7800 Pure hypercholesterolemia, unspecified: Secondary | ICD-10-CM | POA: Diagnosis not present

## 2016-06-07 DIAGNOSIS — E1165 Type 2 diabetes mellitus with hyperglycemia: Secondary | ICD-10-CM | POA: Diagnosis not present

## 2016-06-08 ENCOUNTER — Encounter: Payer: Self-pay | Admitting: Rheumatology

## 2016-06-08 ENCOUNTER — Ambulatory Visit (INDEPENDENT_AMBULATORY_CARE_PROVIDER_SITE_OTHER): Payer: Medicare Other | Admitting: Rheumatology

## 2016-06-08 VITALS — BP 150/80 | HR 76 | Resp 14 | Ht 70.0 in | Wt 214.0 lb

## 2016-06-08 DIAGNOSIS — R52 Pain, unspecified: Secondary | ICD-10-CM | POA: Diagnosis not present

## 2016-06-08 DIAGNOSIS — M170 Bilateral primary osteoarthritis of knee: Secondary | ICD-10-CM

## 2016-06-08 DIAGNOSIS — R413 Other amnesia: Secondary | ICD-10-CM | POA: Diagnosis not present

## 2016-06-08 DIAGNOSIS — Z79899 Other long term (current) drug therapy: Secondary | ICD-10-CM

## 2016-06-08 DIAGNOSIS — M1990 Unspecified osteoarthritis, unspecified site: Secondary | ICD-10-CM

## 2016-06-08 DIAGNOSIS — Z8639 Personal history of other endocrine, nutritional and metabolic disease: Secondary | ICD-10-CM | POA: Diagnosis not present

## 2016-06-08 DIAGNOSIS — R768 Other specified abnormal immunological findings in serum: Secondary | ICD-10-CM | POA: Diagnosis not present

## 2016-06-08 DIAGNOSIS — M19041 Primary osteoarthritis, right hand: Secondary | ICD-10-CM | POA: Diagnosis not present

## 2016-06-08 DIAGNOSIS — Z8679 Personal history of other diseases of the circulatory system: Secondary | ICD-10-CM

## 2016-06-08 DIAGNOSIS — M19042 Primary osteoarthritis, left hand: Secondary | ICD-10-CM | POA: Diagnosis not present

## 2016-06-08 DIAGNOSIS — R5383 Other fatigue: Secondary | ICD-10-CM

## 2016-06-08 LAB — CBC WITH DIFFERENTIAL/PLATELET
BASOS ABS: 0 {cells}/uL (ref 0–200)
BASOS PCT: 0 %
EOS ABS: 140 {cells}/uL (ref 15–500)
EOS PCT: 2 %
HCT: 47.7 % (ref 38.5–50.0)
Hemoglobin: 15.7 g/dL (ref 13.2–17.1)
LYMPHS ABS: 2660 {cells}/uL (ref 850–3900)
LYMPHS PCT: 38 %
MCH: 31.2 pg (ref 27.0–33.0)
MCHC: 32.9 g/dL (ref 32.0–36.0)
MCV: 94.6 fL (ref 80.0–100.0)
MONOS PCT: 14 %
MPV: 9.5 fL (ref 7.5–12.5)
Monocytes Absolute: 980 cells/uL — ABNORMAL HIGH (ref 200–950)
NEUTROS ABS: 3220 {cells}/uL (ref 1500–7800)
Neutrophils Relative %: 46 %
PLATELETS: 169 10*3/uL (ref 140–400)
RBC: 5.04 MIL/uL (ref 4.20–5.80)
RDW: 13.9 % (ref 11.0–15.0)
WBC: 7 10*3/uL (ref 3.8–10.8)

## 2016-06-08 MED ORDER — SULFASALAZINE 500 MG PO TBEC: 1000.0000 mg | DELAYED_RELEASE_TABLET | Freq: Two times a day (BID) | ORAL | 1 refills | Status: DC

## 2016-06-08 MED ORDER — TRAMADOL HCL 50 MG PO TABS: 50.0000 mg | ORAL_TABLET | Freq: Every evening | ORAL | 2 refills | Status: AC | PRN

## 2016-06-09 LAB — COMPLETE METABOLIC PANEL WITH GFR
ALK PHOS: 84 U/L (ref 40–115)
ALT: 17 U/L (ref 9–46)
AST: 20 U/L (ref 10–35)
Albumin: 4 g/dL (ref 3.6–5.1)
BUN: 19 mg/dL (ref 7–25)
CALCIUM: 9.3 mg/dL (ref 8.6–10.3)
CHLORIDE: 106 mmol/L (ref 98–110)
CO2: 25 mmol/L (ref 20–31)
Creat: 0.8 mg/dL (ref 0.70–1.25)
GFR, Est Non African American: >89 mL/min (ref >=60)
Glucose, Bld: 138 mg/dL — ABNORMAL HIGH (ref 65–99)
POTASSIUM: 4.4 mmol/L (ref 3.5–5.3)
Sodium: 140 mmol/L (ref 135–146)
Total Bilirubin: 0.4 mg/dL (ref 0.2–1.2)
Total Protein: 6.5 g/dL (ref 6.1–8.1)

## 2016-06-09 LAB — PAIN MGMT, PROFILE 5 W/CONF, U
AMPHETAMINES: NEGATIVE ng/mL (ref <500)
BARBITURATES: NEGATIVE ng/mL (ref <300)
Benzodiazepines: NEGATIVE ng/mL (ref <100)
Cocaine Metabolite: NEGATIVE ng/mL (ref <150)
Creatinine: 178.9 mg/dL (ref >=20.0)
METHADONE METABOLITE: NEGATIVE ng/mL (ref <100)
Marijuana Metabolite: NEGATIVE ng/mL (ref <20)
OXIDANT: NEGATIVE ug/mL (ref <200)
OXYCODONE: NEGATIVE ng/mL (ref <100)
Opiates: NEGATIVE ng/mL (ref <100)
pH: 6.2 (ref 4.5–9.0)

## 2016-06-14 DIAGNOSIS — G609 Hereditary and idiopathic neuropathy, unspecified: Secondary | ICD-10-CM | POA: Diagnosis not present

## 2016-06-14 DIAGNOSIS — I10 Essential (primary) hypertension: Secondary | ICD-10-CM | POA: Diagnosis not present

## 2016-06-14 DIAGNOSIS — E7800 Pure hypercholesterolemia, unspecified: Secondary | ICD-10-CM | POA: Diagnosis not present

## 2016-06-14 DIAGNOSIS — E1165 Type 2 diabetes mellitus with hyperglycemia: Secondary | ICD-10-CM | POA: Diagnosis not present

## 2016-06-22 ENCOUNTER — Observation Stay (HOSPITAL_COMMUNITY)
Admission: EM | Admit: 2016-06-22 | Discharge: 2016-06-24 | Disposition: A | Discharge status: Home / Self Care | Payer: Medicare Other | Attending: Internal Medicine | Admitting: Internal Medicine

## 2016-06-22 ENCOUNTER — Emergency Department (HOSPITAL_COMMUNITY): Payer: Medicare Other

## 2016-06-22 ENCOUNTER — Encounter (HOSPITAL_COMMUNITY): Payer: Self-pay | Admitting: Emergency Medicine

## 2016-06-22 ENCOUNTER — Observation Stay (HOSPITAL_COMMUNITY): Payer: Medicare Other

## 2016-06-22 DIAGNOSIS — M19042 Primary osteoarthritis, left hand: Secondary | ICD-10-CM | POA: Diagnosis not present

## 2016-06-22 DIAGNOSIS — Z7902 Long term (current) use of antithrombotics/antiplatelets: Secondary | ICD-10-CM | POA: Diagnosis not present

## 2016-06-22 DIAGNOSIS — Z8679 Personal history of other diseases of the circulatory system: Secondary | ICD-10-CM

## 2016-06-22 DIAGNOSIS — G458 Other transient cerebral ischemic attacks and related syndromes: Secondary | ICD-10-CM

## 2016-06-22 DIAGNOSIS — E119 Type 2 diabetes mellitus without complications: Secondary | ICD-10-CM

## 2016-06-22 DIAGNOSIS — M19041 Primary osteoarthritis, right hand: Secondary | ICD-10-CM | POA: Diagnosis not present

## 2016-06-22 DIAGNOSIS — Z955 Presence of coronary angioplasty implant and graft: Secondary | ICD-10-CM | POA: Insufficient documentation

## 2016-06-22 DIAGNOSIS — Z791 Long term (current) use of non-steroidal anti-inflammatories (NSAID): Secondary | ICD-10-CM | POA: Diagnosis not present

## 2016-06-22 DIAGNOSIS — Z794 Long term (current) use of insulin: Secondary | ICD-10-CM | POA: Insufficient documentation

## 2016-06-22 DIAGNOSIS — E559 Vitamin D deficiency, unspecified: Secondary | ICD-10-CM | POA: Diagnosis not present

## 2016-06-22 DIAGNOSIS — M170 Bilateral primary osteoarthritis of knee: Secondary | ICD-10-CM | POA: Insufficient documentation

## 2016-06-22 DIAGNOSIS — I10 Essential (primary) hypertension: Secondary | ICD-10-CM | POA: Diagnosis not present

## 2016-06-22 DIAGNOSIS — R51 Headache: Secondary | ICD-10-CM | POA: Diagnosis not present

## 2016-06-22 DIAGNOSIS — G459 Transient cerebral ischemic attack, unspecified: Secondary | ICD-10-CM | POA: Diagnosis not present

## 2016-06-22 DIAGNOSIS — Z79899 Other long term (current) drug therapy: Secondary | ICD-10-CM | POA: Diagnosis not present

## 2016-06-22 DIAGNOSIS — Z7982 Long term (current) use of aspirin: Secondary | ICD-10-CM | POA: Diagnosis not present

## 2016-06-22 DIAGNOSIS — Z8673 Personal history of transient ischemic attack (TIA), and cerebral infarction without residual deficits: Secondary | ICD-10-CM | POA: Diagnosis not present

## 2016-06-22 DIAGNOSIS — R4701 Aphasia: Secondary | ICD-10-CM | POA: Diagnosis not present

## 2016-06-22 DIAGNOSIS — I2510 Atherosclerotic heart disease of native coronary artery without angina pectoris: Secondary | ICD-10-CM | POA: Diagnosis present

## 2016-06-22 DIAGNOSIS — E782 Mixed hyperlipidemia: Secondary | ICD-10-CM | POA: Diagnosis present

## 2016-06-22 DIAGNOSIS — R4781 Slurred speech: Secondary | ICD-10-CM | POA: Insufficient documentation

## 2016-06-22 LAB — COMPREHENSIVE METABOLIC PANEL
ALT: 22 U/L (ref 17–63)
ANION GAP: 8 (ref 5–15)
AST: 28 U/L (ref 15–41)
Albumin: 4.2 g/dL (ref 3.5–5.0)
Alkaline Phosphatase: 77 U/L (ref 38–126)
BUN: 11 mg/dL (ref 6–20)
CHLORIDE: 106 mmol/L (ref 101–111)
CO2: 27 mmol/L (ref 22–32)
Calcium: 9.2 mg/dL (ref 8.9–10.3)
Creatinine, Ser: 0.84 mg/dL (ref 0.61–1.24)
GFR calc Af Amer: >60 mL/min (ref >60)
GFR calc non Af Amer: >60 mL/min (ref >60)
Glucose, Bld: 98 mg/dL (ref 65–99)
POTASSIUM: 3.7 mmol/L (ref 3.5–5.1)
Sodium: 141 mmol/L (ref 135–145)
Total Bilirubin: 0.5 mg/dL (ref 0.3–1.2)
Total Protein: 6.6 g/dL (ref 6.5–8.1)

## 2016-06-22 LAB — I-STAT CHEM 8, ED
BUN: 14 mg/dL (ref 6–20)
CALCIUM ION: 1.12 mmol/L — AB (ref 1.15–1.40)
Chloride: 105 mmol/L (ref 101–111)
Creatinine, Ser: 0.8 mg/dL (ref 0.61–1.24)
Glucose, Bld: 96 mg/dL (ref 65–99)
HEMATOCRIT: 49 % (ref 39.0–52.0)
HEMOGLOBIN: 16.7 g/dL (ref 13.0–17.0)
Potassium: 3.8 mmol/L (ref 3.5–5.1)
SODIUM: 141 mmol/L (ref 135–145)
TCO2: 28 mmol/L (ref 0–100)

## 2016-06-22 LAB — DIFFERENTIAL
BASOS ABS: 0 10*3/uL (ref 0.0–0.1)
BASOS PCT: 0 %
EOS ABS: 0.2 10*3/uL (ref 0.0–0.7)
EOS PCT: 2 %
LYMPHS ABS: 2.8 10*3/uL (ref 0.7–4.0)
Lymphocytes Relative: 37 %
MONOS PCT: 15 %
Monocytes Absolute: 1.2 10*3/uL — ABNORMAL HIGH (ref 0.1–1.0)
NEUTROS PCT: 46 %
Neutro Abs: 3.4 10*3/uL (ref 1.7–7.7)

## 2016-06-22 LAB — CBC
HCT: 46.9 % (ref 39.0–52.0)
HEMOGLOBIN: 15.5 g/dL (ref 13.0–17.0)
MCH: 31.3 pg (ref 26.0–34.0)
MCHC: 33 g/dL (ref 30.0–36.0)
MCV: 94.7 fL (ref 78.0–100.0)
Platelets: 183 10*3/uL (ref 150–400)
RBC: 4.95 MIL/uL (ref 4.22–5.81)
RDW: 13.9 % (ref 11.5–15.5)
WBC: 7.6 10*3/uL (ref 4.0–10.5)

## 2016-06-22 LAB — PROTIME-INR
INR: 0.93
Prothrombin Time: 12.4 seconds (ref 11.4–15.2)

## 2016-06-22 LAB — GLUCOSE, CAPILLARY: Glucose-Capillary: 92 mg/dL (ref 65–99)

## 2016-06-22 LAB — I-STAT TROPONIN, ED: TROPONIN I, POC: 0.01 ng/mL (ref 0.00–0.08)

## 2016-06-22 LAB — APTT: aPTT: 30 seconds (ref 24–36)

## 2016-06-22 LAB — CBG MONITORING, ED: GLUCOSE-CAPILLARY: 90 mg/dL (ref 65–99)

## 2016-06-22 MED ORDER — ACETAMINOPHEN 325 MG PO TABS: 650.0000 mg | ORAL_TABLET | ORAL | Status: DC | PRN

## 2016-06-22 MED ORDER — CLOPIDOGREL BISULFATE 75 MG PO TABS
75.0000 mg | ORAL_TABLET | Freq: Every day | ORAL | Status: DC
  Administered 2016-06-23 – 2016-06-24 (×2): 75 mg via ORAL
  Filled 2016-06-22 (×2): qty 1

## 2016-06-22 MED ORDER — ACETAMINOPHEN 500 MG PO TABS
1000.0000 mg | ORAL_TABLET | Freq: Once | ORAL | Status: AC
  Administered 2016-06-22: 1000 mg via ORAL
  Filled 2016-06-22: qty 2

## 2016-06-22 MED ORDER — ACETAMINOPHEN 650 MG RE SUPP: 650.0000 mg | RECTAL | Status: DC | PRN

## 2016-06-22 MED ORDER — SULFASALAZINE 500 MG PO TBEC
1000.0000 mg | DELAYED_RELEASE_TABLET | Freq: Two times a day (BID) | ORAL | Status: DC
  Administered 2016-06-23 – 2016-06-24 (×4): 1000 mg via ORAL
  Filled 2016-06-22 (×5): qty 2

## 2016-06-22 MED ORDER — TRAMADOL HCL 50 MG PO TABS: 50.0000 mg | ORAL_TABLET | Freq: Every evening | ORAL | Status: DC | PRN

## 2016-06-22 MED ORDER — ACETAMINOPHEN 160 MG/5ML PO SOLN: 650.0000 mg | ORAL | Status: DC | PRN

## 2016-06-22 MED ORDER — IRBESARTAN 150 MG PO TABS
150.0000 mg | ORAL_TABLET | Freq: Every day | ORAL | Status: DC
  Administered 2016-06-23 – 2016-06-24 (×2): 150 mg via ORAL
  Filled 2016-06-22 (×2): qty 1

## 2016-06-22 MED ORDER — INSULIN ASPART 100 UNIT/ML ~~LOC~~ SOLN
0.0000 [IU] | Freq: Three times a day (TID) | SUBCUTANEOUS | Status: DC
  Administered 2016-06-23: 2 [IU] via SUBCUTANEOUS
  Administered 2016-06-23: 5 [IU] via SUBCUTANEOUS
  Administered 2016-06-23: 2 [IU] via SUBCUTANEOUS
  Administered 2016-06-24: 3 [IU] via SUBCUTANEOUS

## 2016-06-22 MED ORDER — DULOXETINE HCL 30 MG PO CPEP
30.0000 mg | ORAL_CAPSULE | Freq: Every day | ORAL | Status: DC
  Administered 2016-06-22 – 2016-06-23 (×2): 30 mg via ORAL
  Filled 2016-06-22 (×4): qty 1

## 2016-06-22 MED ORDER — CARVEDILOL 12.5 MG PO TABS
12.5000 mg | ORAL_TABLET | Freq: Two times a day (BID) | ORAL | Status: DC
  Administered 2016-06-23 – 2016-06-24 (×3): 12.5 mg via ORAL
  Filled 2016-06-22 (×3): qty 1

## 2016-06-22 MED ORDER — ROSUVASTATIN CALCIUM 5 MG PO TABS
10.0000 mg | ORAL_TABLET | Freq: Every day | ORAL | Status: DC
  Administered 2016-06-23: 10 mg via ORAL
  Filled 2016-06-22: qty 2

## 2016-06-22 MED ORDER — ASPIRIN EC 81 MG PO TBEC
81.0000 mg | DELAYED_RELEASE_TABLET | Freq: Every day | ORAL | Status: DC
  Administered 2016-06-22 – 2016-06-23 (×2): 81 mg via ORAL
  Filled 2016-06-22 (×2): qty 1

## 2016-06-22 MED ORDER — SENNOSIDES-DOCUSATE SODIUM 8.6-50 MG PO TABS
1.0000 | ORAL_TABLET | Freq: Every evening | ORAL | Status: DC | PRN
  Filled 2016-06-22: qty 1

## 2016-06-22 NOTE — ED Notes (Signed)
Pt at mri

## 2016-06-22 NOTE — ED Notes (Signed)
cbg was 90

## 2016-06-22 NOTE — ED Notes (Signed)
Pt's only complaint at this time is a headache.  Tylenol given for same

## 2016-06-22 NOTE — H&P (Signed)
History and Physical    Alan Wang YCX:[id number] DOB: 1950/06/15 DOA: 06/22/2016  PCP: Cammy Copa, MD   Patient coming from: Home  Chief Complaint: Word finding difficulty, slurred speech, difficulty using the remote control  HPI: Alan Wang is a 66 y.o. gentleman with a history of CAD S/P multiple prior stents, IDDM, HTN, HLD, and prior TIA who feels that he was in this baseline state of health until this afternoon when he developed word finding difficulty and garbled speech while watching TV.  He also had difficulty remembering how to use the remote control to the TV.  He says that symptoms were present for about an hour before he said something to his wife.  He subsequently developed headache and nausea, though he still waited about another half hour before finally letting his wife bring him to the ED.  No focal weakness.  No falls.  No head trauma.  No seizure like activity.    ED Course: Speech deficits resolved by the time he arrived to the ED.  He received acetaminophen for headache.  Head CT was negative for acute findings.  EKG shows NSR.  Basic labs were unremarkable.  Neurology was consulted from the ED.  Hospitalist asked to admit.  Review of Systems: As per HPI otherwise 10 systems reviewed and negative.   Past Medical History:  Diagnosis Date  . Arthritis   . Asthma   . Coronary artery disease   . Diabetes mellitus    type 2, insulin dependent for 30+ years  . History of kidney stones 11/2012  . Hyperlipemia   . Hypertension   . IDDM (insulin dependent diabetes mellitus) ([place])   . Shortness of breath   . Stroke ([place])    hx TIA    Past Surgical History:  Procedure Laterality Date  . ANGIOPLASTY  04/06/2011   x3   . CARDIAC CATHETERIZATION    . CARDIAC CATHETERIZATION N/A 09/16/2015   Procedure: Left Heart Cath and Coronary Angiography;  Surgeon: Adrian Prows, MD;  Location: [facility];  Service: Cardiovascular;  Laterality: N/A;  .  cataracts    . CHOLECYSTECTOMY    . HERNIA REPAIR     x2  . LEFT HEART CATHETERIZATION WITH CORONARY ANGIOGRAM N/A 03/08/2011   Procedure: LEFT HEART CATHETERIZATION WITH CORONARY ANGIOGRAM;  Surgeon: Laverda Page, MD;  Location: [facility] CATH LAB;  Service: Cardiovascular;  Laterality: N/A;  . PERCUTANEOUS CORONARY STENT INTERVENTION (PCI-S) N/A 04/06/2011   Procedure: PERCUTANEOUS CORONARY STENT INTERVENTION (PCI-S);  Surgeon: Laverda Page, MD;  Location: [facility] CATH LAB;  Service: Cardiovascular;  Laterality: N/A;     reports that he has never smoked. He has never used smokeless tobacco. He reports that he drinks alcohol. He reports that he does not use drugs.  Occasional EtOH.  He is married with two children.  Allergies  Allergen Reactions  . Avandia [Rosiglitazone Maleate] Swelling    Lower extremities     Family History  Problem Relation Age of Onset  . Emphysema Mother   . COPD Mother   . Alzheimer's disease Father   . Asthma Paternal Aunt   . Heart attack Paternal Grandfather      Prior to Admission medications   Medication Sig Start Date End Date Taking? Authorizing Provider  Ascorbic Acid (VITAMIN C) 1000 MG tablet Take 1,000 mg by mouth daily.    Yes Historical Provider, MD  aspirin EC 81 MG tablet Take 81 mg by mouth at bedtime.  Yes Historical Provider, MD  Canagliflozin (INVOKANA) 100 MG TABS Take 100 mg by mouth daily.    Yes Historical Provider, MD  carvedilol (COREG) 12.5 MG tablet Take 12.5 mg by mouth 2 (two) times daily with a meal.    Yes Historical Provider, MD  Cholecalciferol (VITAMIN D) 2000 units CAPS Take 2,000 Units by mouth daily.   Yes Historical Provider, MD  CINNAMON PO Take 2,000 mg by mouth 2 (two) times daily.   Yes Historical Provider, MD  clopidogrel (PLAVIX) 75 MG tablet Take 75 mg by mouth daily.   Yes Historical Provider, MD  DULoxetine (CYMBALTA) 30 MG capsule Take 30 mg by mouth at bedtime.    Yes Historical Provider, MD  insulin NPH  (HUMULIN N,NOVOLIN N) 100 UNIT/ML injection Inject 5-25 Units into the skin See admin instructions. Inject 5 units every morning and inject 25 units every night.   Yes Historical Provider, MD  insulin regular (NOVOLIN R,HUMULIN R) 100 units/mL injection Inject 20 Units into the skin 2 (two) times daily before a meal.    Yes Historical Provider, MD  metFORMIN (GLUCOPHAGE-XR) 500 MG 24 hr tablet Take 2 tablets (1,000 mg total) by mouth 2 (two) times daily. 09/18/15  Yes Adrian Prows, MD  methocarbamol (ROBAXIN) 500 MG tablet Take 500 mg by mouth 2 (two) times daily as needed (for muscle spasm).   Yes Historical Provider, MD  naproxen sodium (ANAPROX) 220 MG tablet Take 220 mg by mouth 2 (two) times daily with a meal.   Yes Historical Provider, MD  rosuvastatin (CRESTOR) 20 MG tablet Take 10 mg by mouth daily.    Yes Historical Provider, MD  sulfaSALAzine (AZULFIDINE) 500 MG EC tablet Take 2 tablets (1,000 mg total) by mouth 2 (two) times daily. 06/08/16 12/05/16 Yes Naitik Panwala, PA-C  traMADol (ULTRAM) 50 MG tablet Take 1 tablet (50 mg total) by mouth at bedtime as needed. Patient taking differently: Take 50 mg by mouth at bedtime as needed for moderate pain.  06/08/16 09/06/16 Yes Naitik Panwala, PA-C  valsartan (DIOVAN) 160 MG tablet Take 80 mg by mouth daily.  06/23/15  Yes Historical Provider, MD    Physical Exam: Vitals:   06/22/16 1836 06/22/16 1845 06/22/16 1853 06/22/16 1915  BP: (!) 169/96 (!) 155/89  (!) 159/89  Pulse: 71 71  67  Resp: 16 12  11   Temp:   98.2 F (36.8 C)   TempSrc:      SpO2: 97% 97%  95%      Constitutional: NAD, calm, comfortable, NONtoxic appearing Vitals:   06/22/16 1836 06/22/16 1845 06/22/16 1853 06/22/16 1915  BP: (!) 169/96 (!) 155/89  (!) 159/89  Pulse: 71 71  67  Resp: 16 12  11   Temp:   98.2 F (36.8 C)   TempSrc:      SpO2: 97% 97%  95%   Eyes: PERRL, lids and conjunctivae normal ENMT: Mucous membranes are moist. Posterior pharynx clear of any exudate  or lesions. Normal dentition.  Neck: normal appearance, supple, no masses Respiratory: clear to auscultation bilaterally, no wheezing, no crackles. Normal respiratory effort. No accessory muscle use.  Cardiovascular: Normal rate, regular rhythm, no murmurs / rubs / gallops. No extremity edema. 2+ pedal pulses. GI: abdomen is soft and compressible.  No distention.  No tenderness.  Bowel sounds are present. Musculoskeletal:  No joint deformity in upper and lower extremities. Good ROM, no contractures. Normal muscle tone.  Skin: no rashes, warm and dry Neurologic: CN 2-12 grossly  intact. Sensation intact, Strength symmetric bilaterally, 5/5.  No pronator drift. Psychiatric: Normal judgment and insight. Alert and oriented x 3. Normal mood.     Labs on Admission: I have personally reviewed following labs and imaging studies  CBC:  Recent Labs Lab 06/22/16 1701 06/22/16 1722  WBC 7.6  --   NEUTROABS 3.4  --   HGB 15.5 16.7  HCT 46.9 49.0  MCV 94.7  --   PLT 183  --    Basic Metabolic Panel:  Recent Labs Lab 06/22/16 1701 06/22/16 1722  NA 141 141  K 3.7 3.8  CL 106 105  CO2 27  --   GLUCOSE 98 96  BUN 11 14  CREATININE 0.84 0.80  CALCIUM 9.2  --    GFR: CrCl cannot be calculated (Unknown ideal weight.). Liver Function Tests:  Recent Labs Lab 06/22/16 1701  AST 28  ALT 22  ALKPHOS 77  BILITOT 0.5  PROT 6.6  ALBUMIN 4.2   Coagulation Profile:  Recent Labs Lab 06/22/16 1701  INR 0.93   CBG:  Recent Labs Lab 06/22/16 1742  GLUCAP 90    Radiological Exams on Admission: Ct Head Wo Contrast  Result Date: 06/22/2016 CLINICAL DATA:  Slight speech problems and slight headache today. EXAM: CT HEAD WITHOUT CONTRAST TECHNIQUE: Contiguous axial images were obtained from the base of the skull through the vertex without intravenous contrast. COMPARISON:  10/31/2013 FINDINGS: Brain: Ventricles, cisterns and other CSF spaces are within normal. There is mild chronic  ischemic microvascular disease. There is no mass, mass effect, shift of midline structures or acute hemorrhage. Possible small old lacune infarct over the right lentiform nucleus. No evidence of acute infarction. Vascular: Calcified plaque over the cavernous segment of the internal carotid arteries and vertebral arteries bilaterally. Skull: Within normal. Sinuses/Orbits: Within normal. Other: None. IMPRESSION: No acute intracranial findings. Mild chronic ischemic microvascular disease. Electronically Signed   By: Marin Olp M.D.   On: 06/22/2016 17:34    EKG: Independently reviewed. NSR.  No acute ST segment changes.  Assessment/Plan Principal Problem:   TIA (transient ischemic attack) Active Problems:   CAD (coronary artery disease), native coronary artery   Mixed hyperlipidemia   History of coronary artery disease   History of hypertension   Type 2 diabetes mellitus without complication (HCC)      Probable TIA (vs CVA) --Neuro consult appreciated --MRI pending --Check A1c and fasting lipid panel --Already on aspirin and plavix, statin  HTN --Diovan  DM --Hold home meds --SSI coverage while here   DVT prophylaxis: SCDs Code Status: FULL Family Communication: Wife present in the ED at time of admission. Disposition Plan: Expect he will go home tomorrow. Consults called: Neurology Admission status: Place in observation with telemetry monitoring   TIME SPENT: 60 minutes   Eber Jones MD [facility] Pager [phone]  If 7PM-7AM, please contact night-coverage [email or web address] Password [facility]  06/22/2016, 8:03 PM

## 2016-06-22 NOTE — Consult Note (Signed)
Admission H&P    Chief Complaint: Speech difficulty and confusion transiently.  HPI: Alan Wang is an 66 y.o. male with a history of TIA, hypertension, hyperlipidemia, coronary artery disease and diabetes mellitus presenting following an episode of transient speech difficulty as well as confusion started at 3:30 PM today. Symptoms lasted for about one hour then resolved. Patient has been taking aspirin and Plavix daily. CT scan of his head showed no acute intracranial abnormality. NIH stroke score at the time of this evaluation was 0.  LSN: 3:30 PM on 06/22/2016 tPA Given: No: Deficits rapidly resolved mRankin:  Past Medical History:  Diagnosis Date  . Arthritis   . Asthma   . Coronary artery disease   . Diabetes mellitus    type 2, insulin dependent for 30+ years  . History of kidney stones 11/2012  . Hyperlipemia   . Hypertension   . IDDM (insulin dependent diabetes mellitus) ([place])   . Shortness of breath   . Stroke ([place])    hx TIA    Past Surgical History:  Procedure Laterality Date  . ANGIOPLASTY  04/06/2011   x3   . CARDIAC CATHETERIZATION    . CARDIAC CATHETERIZATION N/A 09/16/2015   Procedure: Left Heart Cath and Coronary Angiography;  Surgeon: Adrian Prows, MD;  Location: [facility];  Service: Cardiovascular;  Laterality: N/A;  . cataracts    . CHOLECYSTECTOMY    . HERNIA REPAIR     x2  . LEFT HEART CATHETERIZATION WITH CORONARY ANGIOGRAM N/A 03/08/2011   Procedure: LEFT HEART CATHETERIZATION WITH CORONARY ANGIOGRAM;  Surgeon: Laverda Page, MD;  Location: [facility] CATH LAB;  Service: Cardiovascular;  Laterality: N/A;  . PERCUTANEOUS CORONARY STENT INTERVENTION (PCI-S) N/A 04/06/2011   Procedure: PERCUTANEOUS CORONARY STENT INTERVENTION (PCI-S);  Surgeon: Laverda Page, MD;  Location: [facility] CATH LAB;  Service: Cardiovascular;  Laterality: N/A;    Family History  Problem Relation Age of Onset  . Emphysema Mother   . COPD Mother   . Alzheimer's disease Father    . Asthma Paternal Aunt   . Heart attack Paternal Grandfather    Social History:  reports that he has never smoked. He has never used smokeless tobacco. He reports that he drinks alcohol. He reports that he does not use drugs.  Allergies:  Allergies  Allergen Reactions  . Avandia [Rosiglitazone Maleate] Swelling    Lower extremities     Medications: Preadmission medications were reviewed by me.  ROS: History obtained from the patient  General ROS: negative for - chills, fatigue, fever, night sweats, weight gain or weight loss Psychological ROS: negative for - behavioral disorder, hallucinations, memory difficulties, mood swings or suicidal ideation Ophthalmic ROS: negative for - blurry vision, double vision, eye pain or loss of vision ENT ROS: negative for - epistaxis, nasal discharge, oral lesions, sore throat, tinnitus or vertigo Allergy and Immunology ROS: negative for - hives or itchy/watery eyes Hematological and Lymphatic ROS: negative for - bleeding problems, bruising or swollen lymph nodes Endocrine ROS: negative for - galactorrhea, hair pattern changes, polydipsia/polyuria or temperature intolerance Respiratory ROS: negative for - cough, hemoptysis, shortness of breath or wheezing Cardiovascular ROS: negative for - chest pain, dyspnea on exertion, edema or irregular heartbeat Gastrointestinal ROS: negative for - abdominal pain, diarrhea, hematemesis, nausea/vomiting or stool incontinence Genito-Urinary ROS: negative for - dysuria, hematuria, incontinence or urinary frequency/urgency Musculoskeletal ROS: negative for - joint swelling or muscular weakness Neurological ROS: as noted in HPI Dermatological ROS: negative for rash  and skin lesion changes  Physical Examination: Blood pressure (!) 159/89, pulse 67, temperature 98.2 F (36.8 C), resp. rate 11, SpO2 95 %.  HEENT-  Normocephalic, no lesions, without obvious abnormality.  Normal external eye and conjunctiva.  Normal  TM's bilaterally.  Normal auditory canals and external ears. Normal external nose, mucus membranes and septum.  Normal pharynx. Neck supple with no masses, nodes, nodules or enlargement. Cardiovascular - regular rate and rhythm, S1, S2 normal, no murmur, click, rub or gallop Lungs - chest clear, no wheezing, rales, normal symmetric air entry Abdomen - soft, non-tender; bowel sounds normal; no masses,  no organomegaly Extremities - no joint deformities, effusion, or inflammation  Neurologic Examination: Mental Status: Alert, oriented, thought content appropriate.  Speech fluent without evidence of aphasia. Able to follow commands without difficulty. Cranial Nerves: II-Visual fields were normal. III/IV/VI-Pupils were equal and reacted normally to light. Extraocular movements were full and conjugate.    V/VII-no facial numbness and no facial weakness. VIII-normal. X-normal speech and symmetrical palatal movement. XI: trapezius strength/neck flexion strength normal bilaterally XII-midline tongue extension with normal strength. Motor: 5/5 bilaterally with normal tone and bulk Sensory: Normal throughout. Deep Tendon Reflexes: 1+ and symmetric. Plantars: Flexor bilaterally Cerebellar: Normal finger-to-nose testing. Carotid auscultation: Normal  Results for orders placed or performed during the hospital encounter of 06/22/16 (from the past 48 hour(s))  Protime-INR     Status: None   Collection Time: 06/22/16  5:01 PM  Result Value Ref Range   Prothrombin Time 12.4 11.4 - 15.2 seconds   INR 0.93   APTT     Status: None   Collection Time: 06/22/16  5:01 PM  Result Value Ref Range   aPTT 30 24 - 36 seconds  CBC     Status: None   Collection Time: 06/22/16  5:01 PM  Result Value Ref Range   WBC 7.6 4.0 - 10.5 K/uL   RBC 4.95 4.22 - 5.81 MIL/uL   Hemoglobin 15.5 13.0 - 17.0 g/dL   HCT 46.9 39.0 - 52.0 %   MCV 94.7 78.0 - 100.0 fL   MCH 31.3 26.0 - 34.0 pg   MCHC 33.0 30.0 - 36.0 g/dL    RDW 13.9 11.5 - 15.5 %   Platelets 183 150 - 400 K/uL  Differential     Status: Abnormal   Collection Time: 06/22/16  5:01 PM  Result Value Ref Range   Neutrophils Relative % 46 %   Neutro Abs 3.4 1.7 - 7.7 K/uL   Lymphocytes Relative 37 %   Lymphs Abs 2.8 0.7 - 4.0 K/uL   Monocytes Relative 15 %   Monocytes Absolute 1.2 (H) 0.1 - 1.0 K/uL   Eosinophils Relative 2 %   Eosinophils Absolute 0.2 0.0 - 0.7 K/uL   Basophils Relative 0 %   Basophils Absolute 0.0 0.0 - 0.1 K/uL  Comprehensive metabolic panel     Status: None   Collection Time: 06/22/16  5:01 PM  Result Value Ref Range   Sodium 141 135 - 145 mmol/L   Potassium 3.7 3.5 - 5.1 mmol/L   Chloride 106 101 - 111 mmol/L   CO2 27 22 - 32 mmol/L   Glucose, Bld 98 65 - 99 mg/dL   BUN 11 6 - 20 mg/dL   Creatinine, Ser 0.84 0.61 - 1.24 mg/dL   Calcium 9.2 8.9 - 10.3 mg/dL   Total Protein 6.6 6.5 - 8.1 g/dL   Albumin 4.2 3.5 - 5.0 g/dL   AST 28  15 - 41 U/L   ALT 22 17 - 63 U/L   Alkaline Phosphatase 77 38 - 126 U/L   Total Bilirubin 0.5 0.3 - 1.2 mg/dL   GFR calc non Af Amer >60 >60 mL/min   GFR calc Af Amer >60 >60 mL/min    Comment: (NOTE) The eGFR has been calculated using the CKD EPI equation. This calculation has not been validated in all clinical situations. eGFR's persistently <60 mL/min signify possible Chronic Kidney Disease.    Anion gap 8 5 - 15  I-stat troponin, ED     Status: None   Collection Time: 06/22/16  5:20 PM  Result Value Ref Range   Troponin i, poc 0.01 0.00 - 0.08 ng/mL   Comment 3            Comment: Due to the release kinetics of cTnI, a negative result within the first hours of the onset of symptoms does not rule out myocardial infarction with certainty. If myocardial infarction is still suspected, repeat the test at appropriate intervals.   I-Stat Chem 8, ED     Status: Abnormal   Collection Time: 06/22/16  5:22 PM  Result Value Ref Range   Sodium 141 135 - 145 mmol/L   Potassium 3.8  3.5 - 5.1 mmol/L   Chloride 105 101 - 111 mmol/L   BUN 14 6 - 20 mg/dL   Creatinine, Ser 0.80 0.61 - 1.24 mg/dL   Glucose, Bld 96 65 - 99 mg/dL   Calcium, Ion 1.12 (L) 1.15 - 1.40 mmol/L   TCO2 28 0 - 100 mmol/L   Hemoglobin 16.7 13.0 - 17.0 g/dL   HCT 49.0 39.0 - 52.0 %  CBG monitoring, ED     Status: None   Collection Time: 06/22/16  5:42 PM  Result Value Ref Range   Glucose-Capillary 90 65 - 99 mg/dL   Ct Head Wo Contrast  Result Date: 06/22/2016 CLINICAL DATA:  Slight speech problems and slight headache today. EXAM: CT HEAD WITHOUT CONTRAST TECHNIQUE: Contiguous axial images were obtained from the base of the skull through the vertex without intravenous contrast. COMPARISON:  10/31/2013 FINDINGS: Brain: Ventricles, cisterns and other CSF spaces are within normal. There is mild chronic ischemic microvascular disease. There is no mass, mass effect, shift of midline structures or acute hemorrhage. Possible small old lacune infarct over the right lentiform nucleus. No evidence of acute infarction. Vascular: Calcified plaque over the cavernous segment of the internal carotid arteries and vertebral arteries bilaterally. Skull: Within normal. Sinuses/Orbits: Within normal. Other: None. IMPRESSION: No acute intracranial findings. Mild chronic ischemic microvascular disease. Electronically Signed   By: Marin Olp M.D.   On: 06/22/2016 17:34    Assessment: 66 y.o. male with multiple risk factors for stroke as well as history of TIA presenting with possible recurrent. Acute left MCA territory stroke cannot be ruled out at this point, however.  Stroke Risk Factors - diabetes mellitus, hyperlipidemia and hypertension  Plan: 1. HgbA1c, fasting lipid panel 2. MRI, MRA  of the brain without contrast 3. Speech consult 4. Echocardiogram 5. Carotid dopplers 6. Prophylactic therapy-Asprin and Plavix 7. Risk factor modification 8. Telemetry monitoring  C.R. Nicole Kindred, MD [facility] [phone]  06/22/2016, 7:58 PM

## 2016-06-22 NOTE — ED Notes (Signed)
Pt ambulating to room from waiting area. Pt walked with a steady gait and had no complaints. Pt placed on monitor.

## 2016-06-22 NOTE — ED Triage Notes (Signed)
Pt sts episode of aphasia today that is now resolved; pt sts frontal HA at present; pt sts hx of TIA in past; no neuro deficits at present

## 2016-06-23 ENCOUNTER — Observation Stay (HOSPITAL_BASED_OUTPATIENT_CLINIC_OR_DEPARTMENT_OTHER): Payer: Medicare Other

## 2016-06-23 ENCOUNTER — Encounter (HOSPITAL_COMMUNITY): Payer: Medicare Other

## 2016-06-23 ENCOUNTER — Observation Stay (HOSPITAL_COMMUNITY)
Admit: 2016-06-23 | Discharge: 2016-06-23 | Disposition: A | Discharge status: Home / Self Care | Payer: Medicare Other | Attending: Internal Medicine | Admitting: Internal Medicine

## 2016-06-23 ENCOUNTER — Observation Stay (HOSPITAL_COMMUNITY): Payer: Medicare Other

## 2016-06-23 DIAGNOSIS — G459 Transient cerebral ischemic attack, unspecified: Secondary | ICD-10-CM | POA: Diagnosis not present

## 2016-06-23 DIAGNOSIS — G458 Other transient cerebral ischemic attacks and related syndromes: Secondary | ICD-10-CM | POA: Diagnosis not present

## 2016-06-23 LAB — GLUCOSE, CAPILLARY
Glucose-Capillary: 137 mg/dL — ABNORMAL HIGH (ref 65–99)
Glucose-Capillary: 209 mg/dL — ABNORMAL HIGH (ref 65–99)
Glucose-Capillary: 150 mg/dL — ABNORMAL HIGH (ref 65–99)
Glucose-Capillary: 176 mg/dL — ABNORMAL HIGH (ref 65–99)

## 2016-06-23 LAB — LIPID PANEL
Cholesterol: 115 mg/dL (ref 0–200)
HDL: 40 mg/dL — ABNORMAL LOW (ref >40)
LDL CALC: 59 mg/dL (ref 0–99)
TRIGLYCERIDES: 80 mg/dL (ref <150)
Total CHOL/HDL Ratio: 2.9 RATIO
VLDL: 16 mg/dL (ref 0–40)

## 2016-06-23 LAB — HEMOGLOBIN A1C
HEMOGLOBIN A1C: 5.8 % — AB (ref 4.8–5.6)
Mean Plasma Glucose: 120 mg/dL

## 2016-06-23 LAB — ECHOCARDIOGRAM COMPLETE
Height: 70 in
WEIGHTICAEL: 3428.8 [oz_av]

## 2016-06-23 NOTE — Evaluation (Signed)
Occupational Therapy Evaluation and Discharge Patient Details Name: Alan Wang MRN: [id number] DOB: May 04, 1950 Today's Date: 06/23/2016    History of Present Illness is an 66 y.o. male with a history of TIA, hypertension, hyperlipidemia, coronary artery disease and diabetes mellitus presenting following an episode of transient speech difficulty as well as confusion started at 3:30 PM today. Symptoms lasted for about one hour then resolved. Patient has been taking aspirin and Plavix daily. CT scan of his head showed no acute intracranial abnormality. MRI revealed  No acute intracranial abnormality identified   Clinical Impression   PTA Pt independent in ADL/IADL and mobility. Pt is back at baseline. Pt able to talk animatedly about love of travel, his family, and his home set-up. Pt received all education and is no longer in need of OT services. If anything changes or symptoms present again, please feel free to order OT again. Thank you for this referral.     Follow Up Recommendations  No OT follow up    Equipment Recommendations  None recommended by OT    Recommendations for Other Services       Precautions / Restrictions Precautions Precautions: None Restrictions Weight Bearing Restrictions: No      Mobility Bed Mobility Overal bed mobility: Modified Independent             General bed mobility comments: increased time, and use of bed rail to assist, but no verbal or physical assist from therapist  Transfers Overall transfer level: Modified independent Equipment used: None             General transfer comment: able to stand up from bed without assist from BUE, back of legs supported on bed    Balance Overall balance assessment: No apparent balance deficits (not formally assessed)                                         ADL either performed or assessed with clinical judgement   ADL Overall ADL's : Modified independent                                       General ADL Comments: Able to perform LB dressing sitting EOB, sink level ADL (including scanning through crowded bucket for items, bilateral hand tasks, and fine motor skills to open containers), tub transfer in gym - no LOB during session at all.     Vision Baseline Vision/History: Wears glasses Wears Glasses: Reading only Patient Visual Report: No change from baseline Vision Assessment?: No apparent visual deficits     Perception     Praxis      Pertinent Vitals/Pain Pain Assessment: No/denies pain     Hand Dominance Right   Extremity/Trunk Assessment Upper Extremity Assessment Upper Extremity Assessment: Overall WFL for tasks assessed   Lower Extremity Assessment Lower Extremity Assessment: Overall WFL for tasks assessed   Cervical / Trunk Assessment Cervical / Trunk Assessment: Other exceptions (rounded shoulders, forward head)   Communication Communication Communication: No difficulties   Cognition Arousal/Alertness: Awake/alert Behavior During Therapy: WFL for tasks assessed/performed Overall Cognitive Status: Within Functional Limits for tasks assessed  General Comments  Pt's wife and son entering at end of session    Exercises     Shoulder Instructions      Home Living Family/patient expects to be discharged to:: Private residence Living Arrangements: Spouse/significant other (cat) Available Help at Discharge: Family;Available 24 hours/day Type of Home: Apartment Home Access: Stairs to enter Entrance Stairs-Number of Steps: 4 Entrance Stairs-Rails: Right;Can reach both;Left Home Layout: Multi-level;Able to live on main level with bedroom/bathroom (does not go upstairs or down in basement) Alternate Level Stairs-Number of Steps: flight   Bathroom Shower/Tub: Occupational psychologist: Handicapped height Bathroom Accessibility: Yes How Accessible: Accessible  via wheelchair Home Equipment: [place] - 2 wheels;Shower seat - built in;Grab bars - tub/shower;Hand held shower head          Prior Functioning/Environment Level of Independence: Independent        Comments: driving, retired, Haematologist, likes to travel        OT Problem List: Other (comment);Decreased safety awareness (problems resolved)      OT Treatment/Interventions:      OT Goals(Current goals can be found in the care plan section) Acute Rehab OT Goals Patient Stated Goal: to get home and get traveling OT Goal Formulation: With patient Time For Goal Achievement: 06/30/16 Potential to Achieve Goals: Good  OT Frequency:     Barriers to D/C:            Co-evaluation              End of Session Equipment Utilized During Treatment: Gait belt Nurse Communication: Mobility status (Pt with no LOB during session - no chair alarm)  Activity Tolerance: Patient tolerated treatment well Patient left: in chair;with call bell/phone within reach;with family/visitor present  OT Visit Diagnosis: Other symptoms and signs involving the nervous system (R29.898)                Time: 0938-1829 OT Time Calculation (min): 26 min Charges:  OT General Charges $OT Visit: 1 Procedure OT Evaluation $OT Eval Low Complexity: 1 Procedure OT Treatments $Self Care/Home Management : 8-22 mins G-Codes: OT G-codes **NOT FOR INPATIENT CLASS** Functional Assessment Tool Used: AM-PAC 6 Clicks Daily Activity Functional Limitation: Self care Self Care Current Status (H3716): 0 percent impaired, limited or restricted Self Care Goal Status (R6789): 0 percent impaired, limited or restricted Self Care Discharge Status (F8101): 0 percent impaired, limited or restricted   Hulda Humphrey OTR/L [place] 06/23/2016, 10:01 AM

## 2016-06-23 NOTE — Care Management Note (Signed)
Case Management Note  Patient Details  Name: Alan Wang MRN: [id number] Date of Birth: Oct 24, 1950  Subjective/Objective:   Pt in with TIA. He is from home with his wife and was IADL.                 Action/Plan: No f/u per PT/OT and no DME needs. Plan when medically ready is to d/c home with self care. CM following.  Expected Discharge Date:                  Expected Discharge Plan:  Home/Self Care  In-House Referral:     Discharge planning Services     Post Acute Care Choice:    Choice offered to:     DME Arranged:    DME Agency:     HH Arranged:    HH Agency:     Status of Service:  In process, will continue to follow  If discussed at Long Length of Stay Meetings, dates discussed:    Additional Comments:  Pollie Friar, RN 06/23/2016, 4:42 PM

## 2016-06-23 NOTE — Progress Notes (Signed)
SLP Cancellation Note  Patient Details Name: Alan Wang MRN: [id number] DOB: [birth year]   Cancelled treatment:       Reason Eval/Treat Not Completed: Other (comment) orders received for swallow evaluation. Pt has passed RN stroke swallow screen, is on a diet, and RN voices no concerns at this time. Will defer for now, but will f/u for speech-language evaluation.   Germain Osgood 06/23/2016, 4:22 PM  Germain Osgood, M.A. CCC-SLP [phone]

## 2016-06-23 NOTE — Progress Notes (Signed)
PROGRESS NOTE  Alan Wang:[id number] DOB: 1950/10/12 DOA: 06/22/2016 PCP: Cammy Copa, MD   LOS: 0 days   Brief Narrative: Alan Wang is a 66 y.o. gentleman with a history of CAD S/P multiple prior stents, IDDM, HTN, HLD, and prior TIA who feels that he was in this baseline state of health until 4/17 afternoon when he developed word finding difficulty and garbled speech while watching TV. Admitted for TIA workup  Assessment & Plan: Principal Problem:   TIA (transient ischemic attack) Active Problems:   CAD (coronary artery disease), native coronary artery   Mixed hyperlipidemia   History of coronary artery disease   History of hypertension   Type 2 diabetes mellitus without complication (HCC)   Probable TIA (vs CVA) -MRI brain negative for CVA -neurology following, d/w Dr. Leonie Man today. Recommends EEG, TCD. 2D echo is still pending -Check A1c and fasting lipid panel -Already on aspirin and plavix, statin  HTN -Diovan  DM -Hold home meds -SSI coverage while here   DVT prophylaxis: SCD Code Status: Full code Family Communication: d/w wife bedside Disposition Plan: home when workup complete  Consultants:   Neurology   Procedures:   2D echo: pending  Antimicrobials:  None    Subjective: - no chest pain, shortness of breath, no abdominal pain, nausea or vomiting. Wants to go home   Objective: Vitals:   06/23/16 0600 06/23/16 0800 06/23/16 1000 06/23/16 1330  BP: (!) 166/95 (!) 154/93 (!) 147/80 138/62  Pulse: 78 90 77 80  Resp: 20 20 19 20   Temp: 98.9 F (37.2 C) 98.1 F (36.7 C)  98.7 F (37.1 C)  TempSrc: Oral Oral  Oral  SpO2: 97% 94% 96% 97%  Weight:      Height:        Intake/Output Summary (Last 24 hours) at 06/23/16 1553 Last data filed at 06/23/16 1331  Gross per 24 hour  Intake              240 ml  Output                0 ml  Net              240 ml   Filed Weights   06/22/16 2209  Weight: 97.2 kg (214 lb 4.8 oz)     Examination: Constitutional: NAD Vitals:   06/23/16 0600 06/23/16 0800 06/23/16 1000 06/23/16 1330  BP: (!) 166/95 (!) 154/93 (!) 147/80 138/62  Pulse: 78 90 77 80  Resp: 20 20 19 20   Temp: 98.9 F (37.2 C) 98.1 F (36.7 C)  98.7 F (37.1 C)  TempSrc: Oral Oral  Oral  SpO2: 97% 94% 96% 97%  Weight:      Height:       Eyes: PERRL, lids and conjunctivae normal Respiratory: clear to auscultation bilaterally, no wheezing, no crackles. Normal respiratory effort.  Cardiovascular: Regular rate and rhythm, no murmurs / rubs / gallops. No LE edema. 2+ pedal pulses Abdomen: no tenderness. Bowel sounds positive.  Musculoskeletal: no clubbing / cyanosis.  Skin: no rashes, lesions, ulcers. No induration Neurologic: non focal    Data Reviewed: I have personally reviewed following labs and imaging studies  CBC:  Recent Labs Lab 06/22/16 1701 06/22/16 1722  WBC 7.6  --   NEUTROABS 3.4  --   HGB 15.5 16.7  HCT 46.9 49.0  MCV 94.7  --   PLT 183  --    Basic Metabolic Panel:  Recent Labs  Lab 06/22/16 1701 06/22/16 1722  NA 141 141  K 3.7 3.8  CL 106 105  CO2 27  --   GLUCOSE 98 96  BUN 11 14  CREATININE 0.84 0.80  CALCIUM 9.2  --    GFR: Estimated Creatinine Clearance: 104.8 mL/min (by C-G formula based on SCr of 0.8 mg/dL). Liver Function Tests:  Recent Labs Lab 06/22/16 1701  AST 28  ALT 22  ALKPHOS 77  BILITOT 0.5  PROT 6.6  ALBUMIN 4.2   No results for input(s): LIPASE, AMYLASE in the last 168 hours. No results for input(s): AMMONIA in the last 168 hours. Coagulation Profile:  Recent Labs Lab 06/22/16 1701  INR 0.93   Cardiac Enzymes: No results for input(s): CKTOTAL, CKMB, CKMBINDEX, TROPONINI in the last 168 hours. BNP (last 3 results) No results for input(s): PROBNP in the last 8760 hours. HbA1C: No results for input(s): HGBA1C in the last 72 hours. CBG:  Recent Labs Lab 06/22/16 1742 06/22/16 2214 06/23/16 0621 06/23/16 1112    GLUCAP 90 92 137* 209*   Lipid Profile:  Recent Labs  06/23/16 0617  CHOL 115  HDL 40*  LDLCALC 59  TRIG 80  CHOLHDL 2.9   Thyroid Function Tests: No results for input(s): TSH, T4TOTAL, FREET4, T3FREE, THYROIDAB in the last 72 hours. Anemia Panel: No results for input(s): VITAMINB12, FOLATE, FERRITIN, TIBC, IRON, RETICCTPCT in the last 72 hours. Urine analysis:    Component Value Date/Time   COLORURINE YELLOW 10/31/2013 [place] 10/31/2013 1323   LABSPEC 1.042 (H) 10/31/2013 1323   PHURINE 5.0 10/31/2013 1323   GLUCOSEU >1000 (A) 10/31/2013 1323   HGBUR SMALL (A) 10/31/2013 1323   BILIRUBINUR NEGATIVE 10/31/2013 1323   KETONESUR 15 (A) 10/31/2013 1323   PROTEINUR NEGATIVE 10/31/2013 1323   UROBILINOGEN 0.2 10/31/2013 1323   NITRITE NEGATIVE 10/31/2013 1323   LEUKOCYTESUR NEGATIVE 10/31/2013 1323   Sepsis Labs: Invalid input(s): PROCALCITONIN, LACTICIDVEN  No results found for this or any previous visit (from the past 240 hour(s)).    Radiology Studies: Ct Head Wo Contrast  Result Date: 06/22/2016 CLINICAL DATA:  Slight speech problems and slight headache today. EXAM: CT HEAD WITHOUT CONTRAST TECHNIQUE: Contiguous axial images were obtained from the base of the skull through the vertex without intravenous contrast. COMPARISON:  10/31/2013 FINDINGS: Brain: Ventricles, cisterns and other CSF spaces are within normal. There is mild chronic ischemic microvascular disease. There is no mass, mass effect, shift of midline structures or acute hemorrhage. Possible small old lacune infarct over the right lentiform nucleus. No evidence of acute infarction. Vascular: Calcified plaque over the cavernous segment of the internal carotid arteries and vertebral arteries bilaterally. Skull: Within normal. Sinuses/Orbits: Within normal. Other: None. IMPRESSION: No acute intracranial findings. Mild chronic ischemic microvascular disease. Electronically Signed   By: Marin Olp M.D.   On: 06/22/2016 17:34   Mr Brain Wo Contrast  Result Date: 06/22/2016 CLINICAL DATA:  66 y/o  M; transient aphasia. EXAM: MRI HEAD WITHOUT CONTRAST TECHNIQUE: Multiplanar, multiecho pulse sequences of the brain and surrounding structures were obtained without intravenous contrast. COMPARISON:  01/19/2005 MRI of the head. FINDINGS: Brain: No acute infarction, hemorrhage, hydrocephalus, extra-axial collection or mass lesion. Foci of T2 FLAIR hyperintense signal abnormality in subcortical and periventricular white matter are nonspecific but compatible with mild chronic microvascular ischemic changes. There is mild brain parenchymal volume loss diffusely. A small focus of T2 hyperintensity within left brachium pontis probably represents a small chronic lacunar  infarction. There is a focus of susceptibility hypointensity within the left parietal lobe (series 7, image 22) with low T1 and T2 signal which may represent hemosiderin deposition from old microhemorrhage or a tiny cavernoma. Vascular: Normal flow voids. Skull and upper cervical spine: Normal marrow signal. Sinuses/Orbits: No abnormal signal of paranasal sinuses or mastoid air cells. Bilateral intra-ocular lens replacement. Other: None. IMPRESSION: 1. No acute intracranial abnormality identified. 2. Mild chronic microvascular ischemic changes and mild parenchymal volume loss of the brain. 3. Left parietal lobe punctate susceptibility focus may represent hemosiderin deposition from old microhemorrhage or a tiny cavernoma. Electronically Signed   By: Kristine Garbe M.D.   On: 06/22/2016 21:23     Scheduled Meds: . aspirin EC  81 mg Oral QHS  . carvedilol  12.5 mg Oral BID WC  . clopidogrel  75 mg Oral Daily  . DULoxetine  30 mg Oral QHS  . insulin aspart  0-15 Units Subcutaneous TID WC  . irbesartan  150 mg Oral Daily  . rosuvastatin  10 mg Oral q1800  . sulfaSALAzine  1,000 mg Oral BID   Continuous Infusions:   Marzetta Board, MD, PhD [facility] Pager [phone]  If 7PM-7AM, please contact night-coverage [email or web address] Password [facility] 06/23/2016, 3:53 PM

## 2016-06-23 NOTE — Progress Notes (Signed)
Pt admitted from ED with stroke symptoms, alert and oriented, c/o of slight headache, just from MRI, pt settled in bed with call light at bedside, tele monitor put and verified on pt, was however reassured and will continue to monitor, v/s stable. Alan Wang, Alan Wang

## 2016-06-23 NOTE — ED Provider Notes (Signed)
[facility] DEPT Provider Note   CSN: [id number] Arrival date & time: 06/22/16  1654     History   Chief Complaint Chief Complaint  Patient presents with  . Stroke Symptoms    HPI Alan Wang is a 65 y.o. male.  Patient is a 66 year old male with past medical history of diabetes, hypertension, and high cholesterol. He also has coronary artery disease and has had stents in the past. Presents today for evaluation of an episode of expressive aphasia and confusion that occurred while watching television. He reports having difficulty forming words and comprehending with the buttons on the remote control were for. This lasted for 20-30 minutes, then his wife convinced him to come to the ER to be evaluated. While in route, the symptoms have resolved and he is now at his baseline. He does report a similar episode approximately 10 years ago for which he had a workup which was unremarkable.   The history is provided by the patient.    Past Medical History:  Diagnosis Date  . Arthritis   . Asthma   . Coronary artery disease   . Diabetes mellitus    type 2, insulin dependent for 30+ years  . History of kidney stones 11/2012  . Hyperlipemia   . Hypertension   . IDDM (insulin dependent diabetes mellitus) ([place])   . Shortness of breath   . Stroke [facility])    hx TIA    Patient Active Problem List   Diagnosis Date Noted  . TIA (transient ischemic attack) 06/22/2016  . Type 2 diabetes mellitus without complication ([place]) 78/24/2353  . Primary osteoarthritis of both hands 06/03/2016  . Primary osteoarthritis of both knees 06/03/2016  . Generalized pain 06/03/2016  . Memory difficulties 06/03/2016  . History of diabetes mellitus 06/03/2016  . History of coronary artery disease 06/03/2016  . History of hypertension 06/03/2016  . History of hypercholesterolemia 06/03/2016  . High risk medication use 06/03/2016  . History of high cholesterol 06/03/2016  . ANA positive 06/03/2016  .  Inflammatory arthritis 10/29/2015  . Vitamin D deficiency 10/29/2015  . Fatigue 09/24/2015  . MGUS (monoclonal gammopathy of unknown significance) 09/22/2015  . Vertigo 10/31/2013  . Chronic cough 11/16/2012  . CAD (coronary artery disease), native coronary artery 03/08/2011  . Postsurgical percutaneous transluminal coronary angioplasty (PTCA) status 03/08/2011  . Type II or unspecified type diabetes mellitus with other coma, not stated as uncontrolled 03/08/2011  . Hypertension associated with diabetes ([place]) 03/08/2011  . Shortness of breath 03/08/2011  . Mixed hyperlipidemia 03/08/2011    Past Surgical History:  Procedure Laterality Date  . ANGIOPLASTY  04/06/2011   x3   . CARDIAC CATHETERIZATION    . CARDIAC CATHETERIZATION N/A 09/16/2015   Procedure: Left Heart Cath and Coronary Angiography;  Surgeon: Adrian Prows, MD;  Location: [place] CV LAB;  Service: Cardiovascular;  Laterality: N/A;  . cataracts    . CHOLECYSTECTOMY    . HERNIA REPAIR     x2  . LEFT HEART CATHETERIZATION WITH CORONARY ANGIOGRAM N/A 03/08/2011   Procedure: LEFT HEART CATHETERIZATION WITH CORONARY ANGIOGRAM;  Surgeon: Laverda Page, MD;  Location: [facility]  CATH LAB;  Service: Cardiovascular;  Laterality: N/A;  . PERCUTANEOUS CORONARY STENT INTERVENTION (PCI-S) N/A 04/06/2011   Procedure: PERCUTANEOUS CORONARY STENT INTERVENTION (PCI-S);  Surgeon: Laverda Page, MD;  Location: [facility] CATH LAB;  Service: Cardiovascular;  Laterality: N/A;       Home Medications    Prior to Admission medications   Medication  Sig Start Date End Date Taking? Authorizing Provider  Ascorbic Acid (VITAMIN C) 1000 MG tablet Take 1,000 mg by mouth daily.    Yes Historical Provider, MD  aspirin EC 81 MG tablet Take 81 mg by mouth at bedtime.   Yes Historical Provider, MD  Canagliflozin (INVOKANA) 100 MG TABS Take 100 mg by mouth daily.    Yes Historical Provider, MD  carvedilol (COREG) 12.5 MG tablet Take 12.5 mg by mouth 2 (two)  times daily with a meal.    Yes Historical Provider, MD  Cholecalciferol (VITAMIN D) 2000 units CAPS Take 2,000 Units by mouth daily.   Yes Historical Provider, MD  CINNAMON PO Take 2,000 mg by mouth 2 (two) times daily.   Yes Historical Provider, MD  clopidogrel (PLAVIX) 75 MG tablet Take 75 mg by mouth daily.   Yes Historical Provider, MD  DULoxetine (CYMBALTA) 30 MG capsule Take 30 mg by mouth at bedtime.    Yes Historical Provider, MD  insulin NPH (HUMULIN N,NOVOLIN N) 100 UNIT/ML injection Inject 5-25 Units into the skin See admin instructions. Inject 5 units every morning and inject 25 units every night.   Yes Historical Provider, MD  insulin regular (NOVOLIN R,HUMULIN R) 100 units/mL injection Inject 20 Units into the skin 2 (two) times daily before a meal.    Yes Historical Provider, MD  metFORMIN (GLUCOPHAGE-XR) 500 MG 24 hr tablet Take 2 tablets (1,000 mg total) by mouth 2 (two) times daily. 09/18/15  Yes Adrian Prows, MD  methocarbamol (ROBAXIN) 500 MG tablet Take 500 mg by mouth 2 (two) times daily as needed (for muscle spasm).   Yes Historical Provider, MD  naproxen sodium (ANAPROX) 220 MG tablet Take 220 mg by mouth 2 (two) times daily with a meal.   Yes Historical Provider, MD  rosuvastatin (CRESTOR) 20 MG tablet Take 10 mg by mouth daily.    Yes Historical Provider, MD  sulfaSALAzine (AZULFIDINE) 500 MG EC tablet Take 2 tablets (1,000 mg total) by mouth 2 (two) times daily. 06/08/16 12/05/16 Yes Naitik Panwala, PA-C  traMADol (ULTRAM) 50 MG tablet Take 1 tablet (50 mg total) by mouth at bedtime as needed. Patient taking differently: Take 50 mg by mouth at bedtime as needed for moderate pain.  06/08/16 09/06/16 Yes Naitik Panwala, PA-C  valsartan (DIOVAN) 160 MG tablet Take 80 mg by mouth daily.  06/23/15  Yes Historical Provider, MD    Family History Family History  Problem Relation Age of Onset  . Emphysema Mother   . COPD Mother   . Alzheimer's disease Father   . Asthma Paternal Aunt     . Heart attack Paternal Grandfather     Social History Social History  Substance Use Topics  . Smoking status: Never Smoker  . Smokeless tobacco: Never Used  . Alcohol use Yes     Comment: occasionally     Allergies   Avandia [rosiglitazone maleate]   Review of Systems Review of Systems  All other systems reviewed and are negative.    Physical Exam Updated Vital Signs BP (!) 162/86 (BP Location: Right Arm)   Pulse 73   Temp 98 F (36.7 C) (Oral)   Resp 20   Ht 5\' 10"  (1.778 m)   Wt 214 lb 4.8 oz (97.2 kg)   SpO2 100%   BMI 30.75 kg/m   Physical Exam  Constitutional: He is oriented to person, place, and time. He appears well-developed and well-nourished. No distress.  HENT:  Head: Normocephalic and atraumatic.  Mouth/Throat: Oropharynx is clear and moist.  Eyes: EOM are normal. Pupils are equal, round, and reactive to light.  Neck: Normal range of motion. Neck supple.  Cardiovascular: Normal rate and regular rhythm.  Exam reveals no friction rub.   No murmur heard. Pulmonary/Chest: Effort normal and breath sounds normal. No respiratory distress. He has no wheezes. He has no rales.  Abdominal: Soft. Bowel sounds are normal. He exhibits no distension. There is no tenderness.  Musculoskeletal: Normal range of motion. He exhibits no edema.  Neurological: He is alert and oriented to person, place, and time. No cranial nerve deficit. He exhibits normal muscle tone. Coordination normal.  Skin: Skin is warm and dry. He is not diaphoretic.  Nursing note and vitals reviewed.    ED Treatments / Results  Labs (all labs ordered are listed, but only abnormal results are displayed) Labs Reviewed  DIFFERENTIAL - Abnormal; Notable for the following:       Result Value   Monocytes Absolute 1.2 (*)    All other components within normal limits  I-STAT CHEM 8, ED - Abnormal; Notable for the following:    Calcium, Ion 1.12 (*)    All other components within normal limits   PROTIME-INR  APTT  CBC  COMPREHENSIVE METABOLIC PANEL  GLUCOSE, CAPILLARY  HEMOGLOBIN A1C  LIPID PANEL  I-STAT TROPOININ, ED  CBG MONITORING, ED    EKG  EKG Interpretation None       Radiology Ct Head Wo Contrast  Result Date: 06/22/2016 CLINICAL DATA:  Slight speech problems and slight headache today. EXAM: CT HEAD WITHOUT CONTRAST TECHNIQUE: Contiguous axial images were obtained from the base of the skull through the vertex without intravenous contrast. COMPARISON:  10/31/2013 FINDINGS: Brain: Ventricles, cisterns and other CSF spaces are within normal. There is mild chronic ischemic microvascular disease. There is no mass, mass effect, shift of midline structures or acute hemorrhage. Possible small old lacune infarct over the right lentiform nucleus. No evidence of acute infarction. Vascular: Calcified plaque over the cavernous segment of the internal carotid arteries and vertebral arteries bilaterally. Skull: Within normal. Sinuses/Orbits: Within normal. Other: None. IMPRESSION: No acute intracranial findings. Mild chronic ischemic microvascular disease. Electronically Signed   By: Marin Olp M.D.   On: 06/22/2016 17:34   Mr Brain Wo Contrast  Result Date: 06/22/2016 CLINICAL DATA:  66 y/o  M; transient aphasia. EXAM: MRI HEAD WITHOUT CONTRAST TECHNIQUE: Multiplanar, multiecho pulse sequences of the brain and surrounding structures were obtained without intravenous contrast. COMPARISON:  01/19/2005 MRI of the head. FINDINGS: Brain: No acute infarction, hemorrhage, hydrocephalus, extra-axial collection or mass lesion. Foci of T2 FLAIR hyperintense signal abnormality in subcortical and periventricular white matter are nonspecific but compatible with mild chronic microvascular ischemic changes. There is mild brain parenchymal volume loss diffusely. A small focus of T2 hyperintensity within left brachium pontis probably represents a small chronic lacunar infarction. There is a focus of  susceptibility hypointensity within the left parietal lobe (series 7, image 22) with low T1 and T2 signal which may represent hemosiderin deposition from old microhemorrhage or a tiny cavernoma. Vascular: Normal flow voids. Skull and upper cervical spine: Normal marrow signal. Sinuses/Orbits: No abnormal signal of paranasal sinuses or mastoid air cells. Bilateral intra-ocular lens replacement. Other: None. IMPRESSION: 1. No acute intracranial abnormality identified. 2. Mild chronic microvascular ischemic changes and mild parenchymal volume loss of the brain. 3. Left parietal lobe punctate susceptibility focus may represent hemosiderin deposition from old microhemorrhage or a tiny cavernoma. Electronically  Signed   By: Kristine Garbe M.D.   On: 06/22/2016 21:23    Procedures Procedures (including critical care time)  Medications Ordered in ED Medications  sulfaSALAzine (AZULFIDINE) EC tablet 1,000 mg (not administered)  traMADol (ULTRAM) tablet 50 mg (not administered)  aspirin EC tablet 81 mg (81 mg Oral Given 06/22/16 2308)  irbesartan (AVAPRO) tablet 150 mg (not administered)  clopidogrel (PLAVIX) tablet 75 mg (not administered)  carvedilol (COREG) tablet 12.5 mg (not administered)  DULoxetine (CYMBALTA) DR capsule 30 mg (30 mg Oral Given 06/22/16 2308)  rosuvastatin (CRESTOR) tablet 10 mg (not administered)  acetaminophen (TYLENOL) tablet 650 mg (not administered)    Or  acetaminophen (TYLENOL) solution 650 mg (not administered)    Or  acetaminophen (TYLENOL) suppository 650 mg (not administered)  senna-docusate (Senokot-S) tablet 1 tablet (not administered)  insulin aspart (novoLOG) injection 0-15 Units (not administered)  acetaminophen (TYLENOL) tablet 1,000 mg (1,000 mg Oral Given 06/22/16 1900)     Initial Impression / Assessment and Plan / ED Course  I have reviewed the triage vital signs and the nursing notes.  Pertinent labs & imaging results that were available during  my care of the patient were reviewed by me and considered in my medical decision making (see chart for details).  Patient presents here with symptoms consistent with a TIA. His head CT is negative and laboratory studies are reassuring. I have discussed this case with Dr. Shon Hale from neurology who is recommending admission to the hospitalist service for TIA workup. Dr. Eulas Post agrees to admit.  Final Clinical Impressions(s) / ED Diagnoses   Final diagnoses:  Slurred speech    New Prescriptions Current Discharge Medication List       Veryl Speak, MD 06/23/16 0005

## 2016-06-23 NOTE — Progress Notes (Signed)
STROKE TEAM PROGRESS NOTE   SUBJECTIVE (INTERVAL HISTORY) His wife is at the bedside.  Wife reports memory issues. Pt's Dad had hx dementia. Wife denied he has gotten lost driving (reports he has gotten road rage recently, so she does most of the driving).   OBJECTIVE Temp:  [98 F (36.7 C)-98.9 F (37.2 C)] 98.1 F (36.7 C) (04/18 0800) Pulse Rate:  [67-90] 90 (04/18 0800) Cardiac Rhythm: Normal sinus rhythm (04/18 0700) Resp:  [9-20] 20 (04/18 0800) BP: (153-173)/(75-96) 154/93 (04/18 0800) SpO2:  [94 %-100 %] 94 % (04/18 0800) Weight:  [97.2 kg (214 lb 4.8 oz)] 97.2 kg (214 lb 4.8 oz) (04/17 2209)  CBC:  Recent Labs Lab 06/22/16 1701 06/22/16 1722  WBC 7.6  --   NEUTROABS 3.4  --   HGB 15.5 16.7  HCT 46.9 49.0  MCV 94.7  --   PLT 183  --     Basic Metabolic Panel:  Recent Labs Lab 06/22/16 1701 06/22/16 1722  NA 141 141  K 3.7 3.8  CL 106 105  CO2 27  --   GLUCOSE 98 96  BUN 11 14  CREATININE 0.84 0.80  CALCIUM 9.2  --     HgbA1c: No results found for: HGBA1C   PHYSICAL EXAM Obese middle aged caucasian male not in distress. . Afebrile. Head is nontraumatic. Neck is supple without bruit.    Cardiac exam no murmur or gallop. Lungs are clear to auscultation. Distal pulses are well felt. Neurological Exam ;  Awake  Alert oriented x 3. Normal speech and language.. Diminished recall 2/3. Clock drawing 3/4. Able to name 10 animals having 4 legs. Able to copy intersecting pentagons quite well. eye movements full without nystagmus.fundi were not visualized. Vision acuity and fields appear normal. Hearing is normal. Palatal movements are normal. Face symmetric. Tongue midline. Normal strength, tone, reflexes and coordination. Normal sensation. Gait deferred.  ASSESSMENT/PLAN Alan Wang is a 66 y.o. male with history of TIA, HTN, HLD, CAD, DM presenting with speech difficulty and transient confusion x 1 hr. He did not receive IV t-PA due to rapidly improving  deficits.   Possible TIA in setting of multiple stroke risk factors. Given history of possible mild cognitive impairment differential also includes amyloid angiopathy versus partial seizures  Or complicatedmigraine  CT head no acute finding. small vessel disease.  MRI  No acute stroke. small vessel disease. Atrophy. Old L parietal microhemorrhage vs tiny cavernoma  MRA  Not ordered.   Check TCD pending   Carotid Doppler  pending   2D Echo  pending   LDL 59  HgbA1c pending  SCDs for VTE prophylaxis  Diet heart healthy/carb modified Room service appropriate? Yes; Fluid consistency: Thin  aspirin 81 mg daily and clopidogrel 75 mg daily prior to admission, now on aspirin 81 mg daily and clopidogrel 75 mg daily  Therapy recommendations:  No therapy needs  Disposition:  Return home  Hypertension  Elevated 150-170s  BP goal normotensive  Hyperlipidemia  Home meds:  crestor 20, resumed in hospital  LDL 59, below goal  Continue statin at discharge  Diabetes  HgbA1c goal < 7.0  Other Stroke Risk Factors  Advanced age  ETOH use  UDS / ETOH level not performed   Obesity, Body mass index is 30.75 kg/m.  No Hx stroke/TIA - evaluated for stroke 09/10/2013 by Dr. Leta Baptist and 10/31/13 in the ED by Dr. Doy Mince  Coronary artery disease - stent  Hospital day # 0  [place] for Pager information 06/23/2016 1:42 PM  I have personally examined this patient, reviewed notes, independently viewed imaging studies, participated in medical decision making and plan of care.ROS completed by me personally and pertinent positives fully documented  I have made any additions or clarifications directly to the above note. Agree with note above.  He presents with a recurrent stereotypical episode of transient speech and language difficulties 10 years apart. Possibilities include complex partial seizures versus atypical migraine and TIAs possible but  unlikely. Recommend further evaluation with checking an EEG. Lab work for reversible causes of memory loss. Follow-up as an outpatient for more detailed cognitive evaluation. Long discussion with the patient and wife and answered questions. Greater than 50% time during this 35 minute visit was spent on counseling and coordination of care about his TIA like episodes, cognitive impairment, risk for Alzheimer's and answering questions  Antony Contras, MD Medical Director [place] Pager: [phone] 06/23/2016 2:52 PM  To contact Stroke Continuity provider, please refer to [email or web address] After hours, contact General Neurology

## 2016-06-23 NOTE — Progress Notes (Signed)
PT Cancellation Note  Patient Details Name: Alan Wang MRN: [id number] DOB: 1951-02-26   Cancelled Treatment:    Reason Eval/Treat Not Completed: PT screened, no needs identified, will sign off.  Per OT report, pt is at baseline function with no further PT needs.  Will cancel the evaluation at this time and sign off. 06/23/2016  Donnella Sham, PT [phone]  (pager)   Tessie Fass Kalany Diekmann 06/23/2016, 12:35 PM

## 2016-06-23 NOTE — Procedures (Signed)
Electroencephalogram (EEG) Report  Date of study: 06/23/16  Requesting clinician: Marzetta Board, MD  Reason for study: Evaluate for seizure  Brief clinical history: This is a 66 year old man admitted after an episode of word finding difficulty and garbled speech. EEG is requested for further evaluation.  Medications:  Current Facility-Administered Medications:  .  acetaminophen (TYLENOL) tablet 650 mg, 650 mg, Oral, Q4H PRN **OR** acetaminophen (TYLENOL) solution 650 mg, 650 mg, Per Tube, Q4H PRN **OR** acetaminophen (TYLENOL) suppository 650 mg, 650 mg, Rectal, Q4H PRN, Lily Kocher, MD .  aspirin EC tablet 81 mg, 81 mg, Oral, QHS, Lily Kocher, MD, 81 mg at 06/22/16 2308 .  carvedilol (COREG) tablet 12.5 mg, 12.5 mg, Oral, BID WC, Lily Kocher, MD, 12.5 mg at 06/23/16 1744 .  clopidogrel (PLAVIX) tablet 75 mg, 75 mg, Oral, Daily, Lily Kocher, MD, 75 mg at 06/23/16 0845 .  DULoxetine (CYMBALTA) DR capsule 30 mg, 30 mg, Oral, QHS, Lily Kocher, MD, 30 mg at 06/22/16 2308 .  insulin aspart (novoLOG) injection 0-15 Units, 0-15 Units, Subcutaneous, TID WC, Lily Kocher, MD, 2 Units at 06/23/16 1744 .  irbesartan (AVAPRO) tablet 150 mg, 150 mg, Oral, Daily, Lily Kocher, MD, 150 mg at 06/23/16 0845 .  rosuvastatin (CRESTOR) tablet 10 mg, 10 mg, Oral, q1800, Lily Kocher, MD, 10 mg at 06/23/16 1744 .  senna-docusate (Senokot-S) tablet 1 tablet, 1 tablet, Oral, QHS PRN, Lily Kocher, MD .  sulfaSALAzine (AZULFIDINE) EC tablet 1,000 mg, 1,000 mg, Oral, BID, Lily Kocher, MD, 1,000 mg at 06/23/16 0845 .  traMADol (ULTRAM) tablet 50 mg, 50 mg, Oral, QHS PRN, Lily Kocher, MD  Description: This is a routine EEG performed using standard international 10-20 electrode placement. A total of 18 channels are recorded, including one for the EKG. The patient is awake and drowsy during the recording.   Activating Maneuvers: None  Findings:  The EKG channel demonstrates a regular rhythm with a rate of  70-80 beats per minute.   The background consists of well-formed alpha activity. The best dominant posterior rhythm is 8-9. This is symmetric and reacts as expected with eye opening. Voltages are mildly reduced throughout.  There are no focal asymmetries. No epileptiform discharges are present. No seizures are recorded.   Drowsiness is recorded and is normal in appearance.   Impression:  This is a normal awake and drowsy EEG.  Clinical correlation: A normal EEG does not preclude the possibility of seizure or epilepsy. Clinical correlation recommended.   Melba Coon, MD [facility]

## 2016-06-23 NOTE — Progress Notes (Signed)
EEG completed; results pending.    

## 2016-06-23 NOTE — Care Management Obs Status (Signed)
[place] NOTIFICATION   Patient Details  Name: Alan Wang MRN: [id number] Date of Birth: [birth year]   Medicare Observation Status Notification Given:  Yes    Pollie Friar, RN 06/23/2016, 11:09 AM

## 2016-06-24 ENCOUNTER — Observation Stay (HOSPITAL_BASED_OUTPATIENT_CLINIC_OR_DEPARTMENT_OTHER): Payer: Medicare Other

## 2016-06-24 DIAGNOSIS — E119 Type 2 diabetes mellitus without complications: Secondary | ICD-10-CM | POA: Diagnosis not present

## 2016-06-24 DIAGNOSIS — E782 Mixed hyperlipidemia: Secondary | ICD-10-CM | POA: Diagnosis not present

## 2016-06-24 DIAGNOSIS — G458 Other transient cerebral ischemic attacks and related syndromes: Secondary | ICD-10-CM | POA: Diagnosis not present

## 2016-06-24 DIAGNOSIS — I2510 Atherosclerotic heart disease of native coronary artery without angina pectoris: Secondary | ICD-10-CM | POA: Diagnosis not present

## 2016-06-24 DIAGNOSIS — G459 Transient cerebral ischemic attack, unspecified: Secondary | ICD-10-CM | POA: Diagnosis not present

## 2016-06-24 LAB — CBC
HEMATOCRIT: 46.9 % (ref 39.0–52.0)
HEMOGLOBIN: 15.6 g/dL (ref 13.0–17.0)
MCH: 31.1 pg (ref 26.0–34.0)
MCHC: 33.3 g/dL (ref 30.0–36.0)
MCV: 93.6 fL (ref 78.0–100.0)
Platelets: 160 10*3/uL (ref 150–400)
RBC: 5.01 MIL/uL (ref 4.22–5.81)
RDW: 13.8 % (ref 11.5–15.5)
WBC: 7.8 10*3/uL (ref 4.0–10.5)

## 2016-06-24 LAB — BASIC METABOLIC PANEL
Anion gap: 11 (ref 5–15)
BUN: 15 mg/dL (ref 6–20)
CALCIUM: 9.3 mg/dL (ref 8.9–10.3)
CHLORIDE: 102 mmol/L (ref 101–111)
CO2: 26 mmol/L (ref 22–32)
CREATININE: 0.82 mg/dL (ref 0.61–1.24)
GFR calc non Af Amer: >60 mL/min (ref >60)
GLUCOSE: 181 mg/dL — AB (ref 65–99)
Potassium: 4 mmol/L (ref 3.5–5.1)
Sodium: 139 mmol/L (ref 135–145)

## 2016-06-24 LAB — GLUCOSE, CAPILLARY
Glucose-Capillary: 187 mg/dL — ABNORMAL HIGH (ref 65–99)
Glucose-Capillary: 208 mg/dL — ABNORMAL HIGH (ref 65–99)

## 2016-06-24 LAB — VAS US CAROTID
LCCADSYS: -130 cm/s
LCCAPDIAS: 20 cm/s
LCCAPSYS: 100 cm/s
LEFT ECA DIAS: -11 cm/s
LEFT VERTEBRAL DIAS: 17 cm/s
Left CCA dist dias: -21 cm/s
Left ICA dist dias: -26 cm/s
Left ICA dist sys: -107 cm/s
Left ICA prox dias: -14 cm/s
Left ICA prox sys: -66 cm/s
RCCADSYS: -79 cm/s
RCCAPDIAS: 6 cm/s
RIGHT ECA DIAS: -2 cm/s
RIGHT VERTEBRAL DIAS: 13 cm/s
Right CCA prox sys: 76 cm/s

## 2016-06-24 NOTE — Progress Notes (Signed)
TCD completed.  Landry Mellow, RDMS, RVT  06/24/2016, 12:09 PM

## 2016-06-24 NOTE — Discharge Instructions (Signed)
Follow with Cammy Copa, MD in 2-3 weeks  Please get a complete blood count and chemistry panel checked by your Primary MD at your next visit, and again as instructed by your Primary MD. Please get your medications reviewed and adjusted by your Primary MD.  Please request your Primary MD to go over all Hospital Tests and Procedure/Radiological results at the follow up, please get all Hospital records sent to your Prim MD by signing hospital release before you go home.  If you had Pneumonia of Lung problems at the Hospital: Please get a 2 view Chest X ray done in 6-8 weeks after hospital discharge or sooner if instructed by your Primary MD.  If you have Congestive Heart Failure: Please call your Cardiologist or Primary MD anytime you have any of the following symptoms:  1) 3 pound weight gain in 24 hours or 5 pounds in 1 week  2) shortness of breath, with or without a dry hacking cough  3) swelling in the hands, feet or stomach  4) if you have to sleep on extra pillows at night in order to breathe  Follow cardiac low salt diet and 1.5 lit/day fluid restriction.  If you have diabetes Accuchecks 4 times/day, Once in AM empty stomach and then before each meal. Log in all results and show them to your primary doctor at your next visit. If any glucose reading is under 80 or above 300 call your primary MD immediately.  If you have Seizure/Convulsions/Epilepsy: Please do not drive, operate heavy machinery, participate in activities at heights or participate in high speed sports until you have seen by Primary MD or a Neurologist and advised to do so again.  If you had Gastrointestinal Bleeding: Please ask your Primary MD to check a complete blood count within one week of discharge or at your next visit. Your endoscopic/colonoscopic biopsies that are pending at the time of discharge, will also need to followed by your Primary MD.  Get Medicines reviewed and adjusted. Please take all  your medications with you for your next visit with your Primary MD  Please request your Primary MD to go over all hospital tests and procedure/radiological results at the follow up, please ask your Primary MD to get all Hospital records sent to his/her office.  If you experience worsening of your admission symptoms, develop shortness of breath, life threatening emergency, suicidal or homicidal thoughts you must seek medical attention immediately by calling 911 or calling your MD immediately  if symptoms less severe.  You must read complete instructions/literature along with all the possible adverse reactions/side effects for all the Medicines you take and that have been prescribed to you. Take any new Medicines after you have completely understood and accpet all the possible adverse reactions/side effects.   Do not drive or operate heavy machinery when taking Pain medications.   Do not take more than prescribed Pain, Sleep and Anxiety Medications  Special Instructions: If you have smoked or chewed Tobacco  in the last 2 yrs please stop smoking, stop any regular Alcohol  and or any Recreational drug use.  Wear Seat belts while driving.  Please note You were cared for by a hospitalist during your hospital stay. If you have any questions about your discharge medications or the care you received while you were in the hospital after you are discharged, you can call the unit and asked to speak with the hospitalist on call if the hospitalist that took care of you is not available. Once  you are discharged, your primary care physician will handle any further medical issues. Please note that NO REFILLS for any discharge medications will be authorized once you are discharged, as it is imperative that you return to your primary care physician (or establish a relationship with a primary care physician if you do not have one) for your aftercare needs so that they can reassess your need for medications and monitor  your lab values.  You can reach the hospitalist office at phone [phone] or fax [phone]   If you do not have a primary care physician, you can call [phone] for a physician referral.  Activity: As tolerated with Full fall precautions use walker/cane & assistance as needed  Diet: heart healthy  Disposition Home

## 2016-06-24 NOTE — Progress Notes (Signed)
STROKE TEAM PROGRESS NOTE   SUBJECTIVE (INTERVAL HISTORY) His wife is at the bedside.  He is stable. No new issues. Awaiting discharge home  OBJECTIVE Temp:  [97.7 F (36.5 C)-98.7 F (37.1 C)] 98.2 F (36.8 C) (04/19 1113) Pulse Rate:  [72-78] 78 (04/19 1113) Cardiac Rhythm: Normal sinus rhythm (04/19 0700) Resp:  [16-20] 20 (04/19 1113) BP: (125-159)/(67-89) 125/67 (04/19 1113) SpO2:  [95 %-100 %] 96 % (04/19 1113)  CBC:   Recent Labs Lab 06/22/16 1701 06/22/16 1722 06/24/16 0526  WBC 7.6  --  7.8  NEUTROABS 3.4  --   --   HGB 15.5 16.7 15.6  HCT 46.9 49.0 46.9  MCV 94.7  --  93.6  PLT 183  --  338    Basic Metabolic Panel:   Recent Labs Lab 06/22/16 1701 06/22/16 1722 06/24/16 0526  NA 141 141 139  K 3.7 3.8 4.0  CL 106 105 102  CO2 27  --  26  GLUCOSE 98 96 181*  BUN 11 14 15   CREATININE 0.84 0.80 0.82  CALCIUM 9.2  --  9.3    HgbA1c:  Lab Results  Component Value Date   HGBA1C 5.8 (H) 06/23/2016     PHYSICAL EXAM Obese middle aged caucasian male not in distress. . Afebrile. Head is nontraumatic. Neck is supple without bruit.    Cardiac exam no murmur or gallop. Lungs are clear to auscultation. Distal pulses are well felt. Neurological Exam ;  Awake  Alert oriented x 3. Normal speech and language.. Diminished recall 2/3.    eye movements full without nystagmus.fundi were not visualized. Vision acuity and fields appear normal. Hearing is normal. Palatal movements are normal. Face symmetric. Tongue midline. Normal strength, tone, reflexes and coordination. Normal sensation. Gait deferred.  ASSESSMENT/PLAN [name] is a 66 y.o. male with history of TIA, HTN, HLD, CAD, DM presenting with speech difficulty and transient confusion x 1 hr. He did not receive IV t-PA due to rapidly improving deficits.   Possible TIA in setting of multiple stroke risk factors. Given history of possible mild cognitive impairment differential also includes amyloid  angiopathy versus partial seizures  Or complicatedmigraine  CT head no acute finding. small vessel disease.  MRI  No acute stroke. small vessel disease. Atrophy. Old L parietal microhemorrhage vs tiny cavernoma  MRA  Not ordered.   Check TCD pending   Carotid Doppler  pending   2D Echo  pending   LDL 59  HgbA1c pending  SCDs for VTE prophylaxis Diet heart healthy/carb modified Room service appropriate? Yes; Fluid consistency: Thin  aspirin 81 mg daily and clopidogrel 75 mg daily prior to admission, now on aspirin 81 mg daily and clopidogrel 75 mg daily  Therapy recommendations:  No therapy needs  Disposition:  Return home  Hypertension  Elevated 150-170s  BP goal normotensive  Hyperlipidemia  Home meds:  crestor 20, resumed in hospital  LDL 59, below goal  Continue statin at discharge  Diabetes  HgbA1c goal < 7.0  Other Stroke Risk Factors  Advanced age  ETOH use  UDS / ETOH level not performed   Obesity, Body mass index is 30.75 kg/m.  No Hx stroke/TIA - evaluated for stroke 09/10/2013 by Dr. Leta Baptist and 10/31/13 in the ED by Dr. Doy Mince  Coronary artery disease - stent  Hospital day # 0 EEG was normal. Recommend discharge home today and follow-up as an outpatient in 6 weeks    . Follow-up as an outpatient  for more detailed cognitive evaluation. Long discussion with the patient and wife and answered questions. Greater than 50% time during this 15 minute visit was spent on counseling and coordination of care about his TIA like episodes, cognitive impairment, risk for Alzheimer's and answering questions  Antony Contras, MD Medical Director [facility] Pager: [phone] 06/24/2016 2:48 PM  To contact Stroke Continuity provider, please refer to [email or web address] After hours, contact General Neurology

## 2016-06-24 NOTE — Discharge Summary (Signed)
Physician Discharge Summary  Alan Wang LGX:[id number] DOB: August 15, 1950 DOA: 06/22/2016  PCP: Cammy Copa, MD  Admit date: 06/22/2016 Discharge date: 06/24/2016  Admitted From: Home Disposition: Home  Recommendations for Outpatient Follow-up:  1. Follow up with PCP in 1-2 weeks 2. Follow up with Dr. Leonie Man in 6 weeks  Home Health: none Equipment/Devices: none   Discharge Condition: Stable CODE STATUS: Full code Diet recommendation: Heart healthy  HPI: Alan Wang is a 66 y.o. gentleman with a history of CAD S/P multiple prior stents, IDDM, HTN, HLD, and prior TIA who feels that he was in this baseline state of health until this afternoon when he developed word finding difficulty and garbled speech while watching TV.  He also had difficulty remembering how to use the remote control to the TV.  He says that symptoms were present for about an hour before he said something to his wife.  He subsequently developed headache and nausea, though he still waited about another half hour before finally letting his wife bring him to the ED. No focal weakness.  No falls.  No head trauma.  No seizure like activity.   Hospital Course: Discharge Diagnoses:  Principal Problem:   TIA (transient ischemic attack) Active Problems:   CAD (coronary artery disease), native coronary artery   Mixed hyperlipidemia   History of coronary artery disease   History of hypertension   Type 2 diabetes mellitus without complication ([place])   TIA-MRI brain done on admission was negative for CVA.  Neurology was consulted and have followed patient while hospitalized.  He underwent a full workup for his TIA.  2D echo showed normal ejection fraction 50-55%, and grade 2 diastolic dysfunction.  Carotid duplex showed 1-39% ICA plaquing. Vertebral artery flow is antegrade.  Transcranial Dopplers were completed however pending at the time of discharge.  Given history of possible mild cognitive impairment in the  differential versus partial seizures given similar episode about 10 years ago, patient also underwent an EEG which was negative for epileptiform discharges.  Patient is already on aspirin and Plavix, is on statin, and these will be continued on discharge. HTN -resume home medications DM -resume home medications Coronary artery disease -with prior stenting.  Continue dual antiplatelet therapy, no chest pain during hospitalization and this appears to be stable at this point.  He is followed by Dr. Einar Gip.  His recent cardiac catheterization was in July 2017.  Discharge Instructions  Discharge Instructions    Ambulatory referral to Neurology    Complete by:  As directed    An appointment is requested in approximately: 6-8 weeks     Allergies as of 06/24/2016      Reactions   Avandia [rosiglitazone Maleate] Swelling   Lower extremities       Medication List    TAKE these medications   aspirin EC 81 MG tablet Take 81 mg by mouth at bedtime.   carvedilol 12.5 MG tablet Commonly known as:  COREG Take 12.5 mg by mouth 2 (two) times daily with a meal.   CINNAMON PO Take 2,000 mg by mouth 2 (two) times daily.   clopidogrel 75 MG tablet Commonly known as:  PLAVIX Take 75 mg by mouth daily.   DULoxetine 30 MG capsule Commonly known as:  CYMBALTA Take 30 mg by mouth at bedtime.   insulin NPH Human 100 UNIT/ML injection Commonly known as:  HUMULIN N,NOVOLIN N Inject 5-25 Units into the skin See admin instructions. Inject 5 units every morning and  inject 25 units every night.   insulin regular 250 units/2.31mL (100 units/mL) injection Commonly known as:  NOVOLIN R,HUMULIN R Inject 20 Units into the skin 2 (two) times daily before a meal.   INVOKANA 100 MG Tabs tablet Generic drug:  canagliflozin Take 100 mg by mouth daily.   metFORMIN 500 MG 24 hr tablet Commonly known as:  GLUCOPHAGE-XR Take 2 tablets (1,000 mg total) by mouth 2 (two) times daily.   methocarbamol 500 MG  tablet Commonly known as:  ROBAXIN Take 500 mg by mouth 2 (two) times daily as needed (for muscle spasm).   naproxen sodium 220 MG tablet Commonly known as:  ANAPROX Take 220 mg by mouth 2 (two) times daily with a meal.   rosuvastatin 20 MG tablet Commonly known as:  CRESTOR Take 10 mg by mouth daily.   sulfaSALAzine 500 MG EC tablet Commonly known as:  AZULFIDINE Take 2 tablets (1,000 mg total) by mouth 2 (two) times daily.   traMADol 50 MG tablet Commonly known as:  ULTRAM Take 1 tablet (50 mg total) by mouth at bedtime as needed. What changed:  reasons to take this   valsartan 160 MG tablet Commonly known as:  DIOVAN Take 80 mg by mouth daily.   vitamin C 1000 MG tablet Take 1,000 mg by mouth daily.   Vitamin D 2000 units Caps Take 2,000 Units by mouth daily.      Follow-up Information    Cammy Copa, MD. Schedule an appointment as soon as possible for a visit in 3 week(s).   Specialty:  Family Medicine Contact information: [address] [place] [address] [phone]          Allergies  Allergen Reactions  . Avandia [Rosiglitazone Maleate] Swelling    Lower extremities     Consultations:  Neurology  Procedures/Studies:  2D echo  Study Conclusions - Left ventricle: The cavity size was mildly dilated. Systolic function was normal. The estimated ejection fraction was in the range of 50% to 55%. Wall motion was normal; there were no regional wall motion abnormalities. Features are consistent with a pseudonormal left ventricular filling pattern, with concomitant abnormal relaxation and increased filling pressure (grade 2 diastolic dysfunction). Doppler parameters are consistent with high ventricular filling pressure. - Aortic valve: Trileaflet; normal thickness, mildly calcified leaflets. - Left atrium: The atrium was mildly dilated. - Pulmonary arteries: Systolic pressure could not be accurately estimated.    Ct Head Wo  Contrast  Result Date: 06/22/2016 CLINICAL DATA:  Slight speech problems and slight headache today. EXAM: CT HEAD WITHOUT CONTRAST TECHNIQUE: Contiguous axial images were obtained from the base of the skull through the vertex without intravenous contrast. COMPARISON:  10/31/2013 FINDINGS: Brain: Ventricles, cisterns and other CSF spaces are within normal. There is mild chronic ischemic microvascular disease. There is no mass, mass effect, shift of midline structures or acute hemorrhage. Possible small old lacune infarct over the right lentiform nucleus. No evidence of acute infarction. Vascular: Calcified plaque over the cavernous segment of the internal carotid arteries and vertebral arteries bilaterally. Skull: Within normal. Sinuses/Orbits: Within normal. Other: None. IMPRESSION: No acute intracranial findings. Mild chronic ischemic microvascular disease. Electronically Signed   By: Marin Olp M.D.   On: 06/22/2016 17:34   Mr Brain Wo Contrast  Result Date: 06/22/2016 CLINICAL DATA:  66 y/o  M; transient aphasia. EXAM: MRI HEAD WITHOUT CONTRAST TECHNIQUE: Multiplanar, multiecho pulse sequences of the brain and surrounding structures were obtained without intravenous contrast. COMPARISON:  01/19/2005 MRI of the head. FINDINGS: Brain: No acute infarction, hemorrhage, hydrocephalus, extra-axial collection or mass lesion. Foci of T2 FLAIR hyperintense signal abnormality in subcortical and periventricular white matter are nonspecific but compatible with mild chronic microvascular ischemic changes. There is mild brain parenchymal volume loss diffusely. A small focus of T2 hyperintensity within left brachium pontis probably represents a small chronic lacunar infarction. There is a focus of susceptibility hypointensity within the left parietal lobe (series 7, image 22) with low T1 and T2 signal which may represent hemosiderin deposition from old microhemorrhage or a tiny cavernoma. Vascular: Normal flow voids.  Skull and upper cervical spine: Normal marrow signal. Sinuses/Orbits: No abnormal signal of paranasal sinuses or mastoid air cells. Bilateral intra-ocular lens replacement. Other: None. IMPRESSION: 1. No acute intracranial abnormality identified. 2. Mild chronic microvascular ischemic changes and mild parenchymal volume loss of the brain. 3. Left parietal lobe punctate susceptibility focus may represent hemosiderin deposition from old microhemorrhage or a tiny cavernoma. Electronically Signed   By: Kristine Garbe M.D.   On: 06/22/2016 21:23      Subjective: - no chest pain, shortness of breath, no abdominal pain, nausea or vomiting.   Discharge Exam: Vitals:   06/24/16 0547 06/24/16 1113  BP: (!) 159/89 125/67  Pulse: 73 78  Resp: 16 20  Temp: 97.7 F (36.5 C) 98.2 F (36.8 C)   Vitals:   06/23/16 1723 06/23/16 2120 06/24/16 0547 06/24/16 1113  BP: (!) 154/85 (!) 146/77 (!) 159/89 125/67  Pulse: 75 72 73 78  Resp: 16 16 16 20   Temp: 98.7 F (37.1 C) 98.2 F (36.8 C) 97.7 F (36.5 C) 98.2 F (36.8 C)  TempSrc: Oral Oral Oral Oral  SpO2: 96% 95% 100% 96%  Weight:      Height:        General: Pt is alert, awake, not in acute distress Cardiovascular: RRR, S1/S2 +, no rubs, no gallops Respiratory: CTA bilaterally, no wheezing, no rhonchi Abdominal: Soft, NT, ND, bowel sounds + Extremities: no edema, no cyanosis    The results of significant diagnostics from this hospitalization (including imaging, microbiology, ancillary and laboratory) are listed below for reference.     Microbiology: No results found for this or any previous visit (from the past 240 hour(s)).   Labs: BNP (last 3 results) No results for input(s): BNP in the last 8760 hours. Basic Metabolic Panel:  Recent Labs Lab 06/22/16 1701 06/22/16 1722 06/24/16 0526  NA 141 141 139  K 3.7 3.8 4.0  CL 106 105 102  CO2 27  --  26  GLUCOSE 98 96 181*  BUN 11 14 15   CREATININE 0.84 0.80 0.82   CALCIUM 9.2  --  9.3   Liver Function Tests:  Recent Labs Lab 06/22/16 1701  AST 28  ALT 22  ALKPHOS 77  BILITOT 0.5  PROT 6.6  ALBUMIN 4.2   No results for input(s): LIPASE, AMYLASE in the last 168 hours. No results for input(s): AMMONIA in the last 168 hours. CBC:  Recent Labs Lab 06/22/16 1701 06/22/16 1722 06/24/16 0526  WBC 7.6  --  7.8  NEUTROABS 3.4  --   --   HGB 15.5 16.7 15.6  HCT 46.9 49.0 46.9  MCV 94.7  --  93.6  PLT 183  --  160   Cardiac Enzymes: No results for input(s): CKTOTAL, CKMB, CKMBINDEX, TROPONINI in the last 168 hours. BNP: Invalid input(s): POCBNP CBG:  Recent Labs Lab 06/23/16 1112 06/23/16 1719 06/23/16  2123 06/24/16 0625 06/24/16 1109  GLUCAP 209* 150* 176* 187* 208*   D-Dimer No results for input(s): DDIMER in the last 72 hours. Hgb A1c  Recent Labs  06/23/16 0617  HGBA1C 5.8*   Lipid Profile  Recent Labs  06/23/16 0617  CHOL 115  HDL 40*  LDLCALC 59  TRIG 80  CHOLHDL 2.9   Thyroid function studies No results for input(s): TSH, T4TOTAL, T3FREE, THYROIDAB in the last 72 hours.  Invalid input(s): FREET3 Anemia work up No results for input(s): VITAMINB12, FOLATE, FERRITIN, TIBC, IRON, RETICCTPCT in the last 72 hours. Urinalysis    Component Value Date/Time   COLORURINE YELLOW 10/31/2013 1323   APPEARANCEUR CLEAR 10/31/2013 1323   LABSPEC 1.042 (H) 10/31/2013 1323   PHURINE 5.0 10/31/2013 1323   GLUCOSEU >1000 (A) 10/31/2013 1323   HGBUR SMALL (A) 10/31/2013 1323   BILIRUBINUR NEGATIVE 10/31/2013 1323   KETONESUR 15 (A) 10/31/2013 1323   PROTEINUR NEGATIVE 10/31/2013 1323   UROBILINOGEN 0.2 10/31/2013 1323   NITRITE NEGATIVE 10/31/2013 1323   LEUKOCYTESUR NEGATIVE 10/31/2013 1323   Sepsis Labs Invalid input(s): PROCALCITONIN,  WBC,  LACTICIDVEN Microbiology No results found for this or any previous visit (from the past 240 hour(s)).   Time coordinating discharge: 40 minutes  SIGNED:  Marzetta Board, MD  [facility] 06/24/2016, 3:52 PM Pager [phone]  If 7PM-7AM, please contact night-coverage [email or web address] Password [email or web address]

## 2016-06-24 NOTE — Progress Notes (Signed)
VASCULAR LAB PRELIMINARY  PRELIMINARY  PRELIMINARY  PRELIMINARY  Carotid duplex completed.    Preliminary report:  1-39% ICA plaquing. Vertebral artery flow is antegrade.   Analyah Mcconnon, RVT 06/24/2016, 9:30 AM

## 2016-06-24 NOTE — Progress Notes (Signed)
D/c instructions reviewed with patient and wife. No further questions at this time.

## 2016-06-24 NOTE — Care Management Note (Signed)
Case Management Note  Patient Details  Name: Alan Wang MRN: [id number] Date of Birth: Mar 24, 1950  Subjective/Objective:                    Action/Plan: Pt discharging home with self care. Pt with insurance, PCP and transportation home. No further needs per CM.   Expected Discharge Date:  06/24/16               Expected Discharge Plan:  Home/Self Care  In-House Referral:     Discharge planning Services     Post Acute Care Choice:    Choice offered to:     DME Arranged:    DME Agency:     HH Arranged:    HH Agency:     Status of Service:  Completed, signed off  If discussed at [employer] of Stay Meetings, dates discussed:    Additional Comments:  Pollie Friar, RN 06/24/2016, 1:09 PM

## 2016-07-26 ENCOUNTER — Telehealth (INDEPENDENT_AMBULATORY_CARE_PROVIDER_SITE_OTHER): Payer: Self-pay | Admitting: Radiology

## 2016-07-26 NOTE — Telephone Encounter (Signed)
RE: Apply Hyalgan bilateral knees 5; Euflexxa Orthovisc are acceptable  Received: Today  Message Contents  Beavers, April J  Korion Cuevas, RT        B/B or P/P/ thank you.   Previous Messages    ----- Message -----  From: Melinna Linarez, RT  Sent: 07/23/2016  3:26 PM  To: April J Beavers  Subject: FW: Apply Hyalgan bilateral knees 5; Euflex*   Can you please call patient and schedule Hyalgan bilateral knees x 5 per Gilmer Mor? Thanks-   Patient has [employer] primary and [place] secondary, ok to do injections, [facility] will cover 80% and supp. Will cover the remaining 20%.    ----- Message -----  From: Shona Needles, RT  Sent: 07/07/2016  4:18 PM  To: Abigail Butts [place], RT  Subject: FW: Apply Hyalgan bilateral knees 5; Euflex*     ----- Message -----  From: Eliezer Lofts, PA-C  Sent: 06/08/2016  3:37 PM  To: Shona Needles, RT  Subject: Apply Hyalgan bilateral knees 5; Euflexxa O*   Apply Hyalgan bilateral knees 5; Euflexxa Orthovisc are acceptable         This will be buy and bill. Thanks! Abigail Butts

## 2016-07-26 NOTE — Telephone Encounter (Signed)
Left message for patient to call back to schedule knee injections.

## 2016-08-30 DIAGNOSIS — R198 Other specified symptoms and signs involving the digestive system and abdomen: Secondary | ICD-10-CM | POA: Diagnosis not present

## 2016-08-30 DIAGNOSIS — Z8371 Family history of colonic polyps: Secondary | ICD-10-CM | POA: Diagnosis not present

## 2016-08-30 DIAGNOSIS — Z8673 Personal history of transient ischemic attack (TIA), and cerebral infarction without residual deficits: Secondary | ICD-10-CM | POA: Diagnosis not present

## 2016-08-30 DIAGNOSIS — I2510 Atherosclerotic heart disease of native coronary artery without angina pectoris: Secondary | ICD-10-CM | POA: Diagnosis not present

## 2016-08-30 DIAGNOSIS — Z794 Long term (current) use of insulin: Secondary | ICD-10-CM | POA: Diagnosis not present

## 2016-08-30 DIAGNOSIS — E119 Type 2 diabetes mellitus without complications: Secondary | ICD-10-CM | POA: Diagnosis not present

## 2016-09-13 DIAGNOSIS — Z7984 Long term (current) use of oral hypoglycemic drugs: Secondary | ICD-10-CM | POA: Diagnosis not present

## 2016-09-13 DIAGNOSIS — E113212 Type 2 diabetes mellitus with mild nonproliferative diabetic retinopathy with macular edema, left eye: Secondary | ICD-10-CM | POA: Diagnosis not present

## 2016-09-13 DIAGNOSIS — E113291 Type 2 diabetes mellitus with mild nonproliferative diabetic retinopathy without macular edema, right eye: Secondary | ICD-10-CM | POA: Diagnosis not present

## 2016-10-06 DIAGNOSIS — K641 Second degree hemorrhoids: Secondary | ICD-10-CM | POA: Diagnosis not present

## 2016-10-06 DIAGNOSIS — Z1211 Encounter for screening for malignant neoplasm of colon: Secondary | ICD-10-CM | POA: Diagnosis not present

## 2016-10-06 DIAGNOSIS — Z8371 Family history of colonic polyps: Secondary | ICD-10-CM | POA: Diagnosis not present

## 2016-10-13 NOTE — Progress Notes (Signed)
   Procedure Note  Patient: Alan Wang             Date of Birth: 07/03/1950           MRN: [id number]             Visit Date: 10/20/2016  Procedures: Visit Diagnoses: Primary osteoarthritis of both knees  Hyalgan #1Bilateral   Large Joint Inj Date/Time: 10/20/2016 1:11 PM Performed by: Bo Merino Authorized by: Bo Merino   Consent Given by:  Patient Site marked: the procedure site was marked   Timeout: prior to procedure the correct patient, procedure, and site was verified   Indications:  Pain Location:  Knee Prep: patient was prepped and draped in usual sterile fashion   Needle Size:  27 G Needle Length:  1.5 inches Ultrasound Guidance: No   Fluoroscopic Guidance: No   Arthrogram: No   Medications:  20 mg Sodium Hyaluronate 20 MG/2ML; 1.5 mL lidocaine 1 % Aspiration Attempted: Yes   Patient tolerance:  Patient tolerated the procedure well with no immediate complications Large Joint Inj Date/Time: 10/20/2016 1:14 PM Performed by: Bo Merino Authorized by: Bo Merino   Consent Given by:  Patient Site marked: the procedure site was marked   Timeout: prior to procedure the correct patient, procedure, and site was verified   Indications:  Pain Location:  Knee Prep: patient was prepped and draped in usual sterile fashion   Needle Size:  27 G Needle Length:  1.5 inches Ultrasound Guidance: No   Fluoroscopic Guidance: No   Arthrogram: No   Medications:  1.5 mL lidocaine 1 %; 20 mg Sodium Hyaluronate 20 MG/2ML Aspiration Attempted: Yes   Patient tolerance:  Patient tolerated the procedure well with no immediate complications   Bo Merino, MD

## 2016-10-20 ENCOUNTER — Ambulatory Visit: Payer: Medicare Other | Admitting: Rheumatology

## 2016-10-20 ENCOUNTER — Ambulatory Visit (INDEPENDENT_AMBULATORY_CARE_PROVIDER_SITE_OTHER): Payer: Medicare Other | Admitting: Rheumatology

## 2016-10-20 DIAGNOSIS — M170 Bilateral primary osteoarthritis of knee: Secondary | ICD-10-CM

## 2016-10-20 MED ORDER — LIDOCAINE HCL 1 % IJ SOLN
1.5000 mL | INTRAMUSCULAR | Status: AC | PRN
  Administered 2016-10-20: 1.5 mL

## 2016-10-20 MED ORDER — SODIUM HYALURONATE (VISCOSUP) 20 MG/2ML IX SOSY
20.0000 mg | PREFILLED_SYRINGE | INTRA_ARTICULAR | Status: AC | PRN
  Administered 2016-10-20: 20 mg via INTRA_ARTICULAR

## 2016-10-26 ENCOUNTER — Encounter: Payer: Self-pay | Admitting: Rheumatology

## 2016-10-26 ENCOUNTER — Ambulatory Visit (INDEPENDENT_AMBULATORY_CARE_PROVIDER_SITE_OTHER): Payer: Medicare Other | Admitting: Rheumatology

## 2016-10-26 VITALS — BP 154/76 | HR 74 | Resp 14 | Ht 70.0 in | Wt 215.0 lb

## 2016-10-26 DIAGNOSIS — R768 Other specified abnormal immunological findings in serum: Secondary | ICD-10-CM | POA: Diagnosis not present

## 2016-10-26 DIAGNOSIS — M19041 Primary osteoarthritis, right hand: Secondary | ICD-10-CM | POA: Diagnosis not present

## 2016-10-26 DIAGNOSIS — M1990 Unspecified osteoarthritis, unspecified site: Secondary | ICD-10-CM | POA: Diagnosis not present

## 2016-10-26 DIAGNOSIS — M19042 Primary osteoarthritis, left hand: Secondary | ICD-10-CM | POA: Diagnosis not present

## 2016-10-26 DIAGNOSIS — M170 Bilateral primary osteoarthritis of knee: Secondary | ICD-10-CM

## 2016-10-26 DIAGNOSIS — Z79899 Other long term (current) drug therapy: Secondary | ICD-10-CM

## 2016-10-26 LAB — CBC WITH DIFFERENTIAL/PLATELET
BASOS PCT: 0 %
Basophils Absolute: 0 cells/uL (ref 0–200)
EOS PCT: 2 %
Eosinophils Absolute: 140 cells/uL (ref 15–500)
HCT: 48.6 % (ref 38.5–50.0)
Hemoglobin: 16 g/dL (ref 13.2–17.1)
LYMPHS ABS: 2520 {cells}/uL (ref 850–3900)
Lymphocytes Relative: 36 %
MCH: 31.2 pg (ref 27.0–33.0)
MCHC: 32.9 g/dL (ref 32.0–36.0)
MCV: 94.7 fL (ref 80.0–100.0)
MONOS PCT: 15 %
MPV: 9.2 fL (ref 7.5–12.5)
Monocytes Absolute: 1050 cells/uL — ABNORMAL HIGH (ref 200–950)
NEUTROS ABS: 3290 {cells}/uL (ref 1500–7800)
Neutrophils Relative %: 47 %
PLATELETS: 184 10*3/uL (ref 140–400)
RBC: 5.13 MIL/uL (ref 4.20–5.80)
RDW: 14.2 % (ref 11.0–15.0)
WBC: 7 10*3/uL (ref 3.8–10.8)

## 2016-10-26 MED ORDER — TRAMADOL HCL 50 MG PO TABS: 50.0000 mg | ORAL_TABLET | Freq: Two times a day (BID) | ORAL | 2 refills | Status: AC

## 2016-10-26 MED ORDER — METHOCARBAMOL 500 MG PO TABS: 500.0000 mg | ORAL_TABLET | Freq: Two times a day (BID) | ORAL | 2 refills | Status: DC | PRN

## 2016-10-26 NOTE — Progress Notes (Signed)
Office Visit Note  Patient: Alan Wang             Date of Birth: 10/07/1950           MRN: [id number]             PCP: Aura Dials, MD Referring: Aura Dials, MD Visit Date: 10/26/2016 Occupation: @GUAROCC @    Subjective:  No chief complaint on file.   History of Present Illness: Alan Wang is a 66 y.o. male  Who was last seen in our office on 06/08/2016 for inflammatory arthritis and high risk prescription (sulfasalazine 500 mg 2 pills twice a day). At the last visit, patient is requesting Visco supplementation. The last Visco supplementation he had was about 2 years ago and he did really well. He also reported at the last visit that he was taking tramadol for pain management of his knee pain as well as joint pain.  [place] well. No joint pain stiffness and swelling. He is getting Hyalgan injections 5 to bilateral knees. His first injection in the series was done last week I Dr. Estanislado Pandy. He's coming back tomorrow 10/27/2016 for second injection. He states that he hasn't noticed any difference in his knees after the first injection. He states that his knees feel "funny". There was no redness warmth or swelling. I advised him to discuss with Dr. Estanislado Pandy but most likely we will take 1 or 2 extra shots before he can feel benefits from this injection.  Patient needs a refill on his methocarbamol (which he uses sparingly) as well as his tramadol (also being use very sparingly. Patient's last labs were within normal limits and he is due for repeat labs today.    Activities of Daily Living:  Patient reports morning stiffness for 15 minutes.   Patient Denies nocturnal pain.  Difficulty dressing/grooming: Denies Difficulty climbing stairs: Denies Difficulty getting out of chair: Denies Difficulty using hands for taps, buttons, cutlery, and/or writing: Denies   Review of Systems  Constitutional: Negative for fatigue.  HENT: Negative for mouth sores and mouth  dryness.   Eyes: Negative for dryness.  Respiratory: Negative for shortness of breath.   Gastrointestinal: Negative for constipation and diarrhea.  Musculoskeletal: Negative for myalgias and myalgias.  Skin: Negative for sensitivity to sunlight.  Neurological: Negative for memory loss.  Psychiatric/Behavioral: Negative for sleep disturbance.    PMFS History:  Patient Active Problem List   Diagnosis Date Noted  . TIA (transient ischemic attack) 06/22/2016  . Type 2 diabetes mellitus without complication ([place]) 35/59/7416  . Primary osteoarthritis of both hands 06/03/2016  . Primary osteoarthritis of both knees 06/03/2016  . Generalized pain 06/03/2016  . Memory difficulties 06/03/2016  . History of diabetes mellitus 06/03/2016  . History of coronary artery disease 06/03/2016  . History of hypertension 06/03/2016  . History of hypercholesterolemia 06/03/2016  . High risk medication use 06/03/2016  . History of high cholesterol 06/03/2016  . ANA positive 06/03/2016  . Inflammatory arthritis 10/29/2015  . Vitamin D deficiency 10/29/2015  . Fatigue 09/24/2015  . MGUS (monoclonal gammopathy of unknown significance) 09/22/2015  . Vertigo 10/31/2013  . Chronic cough 11/16/2012  . CAD (coronary artery disease), native coronary artery 03/08/2011  . Postsurgical percutaneous transluminal coronary angioplasty (PTCA) status 03/08/2011  . Type II or unspecified type diabetes mellitus with other coma, not stated as uncontrolled 03/08/2011  . Hypertension associated with diabetes ([place]) 03/08/2011  . Shortness of breath 03/08/2011  . Mixed hyperlipidemia 03/08/2011  Past Medical History:  Diagnosis Date  . Arthritis   . Asthma   . Coronary artery disease   . Diabetes mellitus    type 2, insulin dependent for 30+ years  . History of kidney stones 11/2012  . Hyperlipemia   . Hypertension   . IDDM (insulin dependent diabetes mellitus) ([place])   . Shortness of breath   . Stroke ([place])     hx TIA    Family History  Problem Relation Age of Onset  . Emphysema Mother   . COPD Mother   . Alzheimer's disease Father   . Asthma Paternal Aunt   . Heart attack Paternal Grandfather    Past Surgical History:  Procedure Laterality Date  . ANGIOPLASTY  04/06/2011   x3   . CARDIAC CATHETERIZATION    . CARDIAC CATHETERIZATION N/A 09/16/2015   Procedure: Left Heart Cath and Coronary Angiography;  Surgeon: Adrian Prows, MD;  Location: [facility];  Service: Cardiovascular;  Laterality: N/A;  . cataracts    . CHOLECYSTECTOMY    . HERNIA REPAIR     x2  . LEFT HEART CATHETERIZATION WITH CORONARY ANGIOGRAM N/A 03/08/2011   Procedure: LEFT HEART CATHETERIZATION WITH CORONARY ANGIOGRAM;  Surgeon: Laverda Page, MD;  Location: [facility] CATH LAB;  Service: Cardiovascular;  Laterality: N/A;  . PERCUTANEOUS CORONARY STENT INTERVENTION (PCI-S) N/A 04/06/2011   Procedure: PERCUTANEOUS CORONARY STENT INTERVENTION (PCI-S);  Surgeon: Laverda Page, MD;  Location: [facility] CATH LAB;  Service: Cardiovascular;  Laterality: N/A;   Social History   Social History Narrative   Patient lives at home with his spouse.   Caffeine Use: occasionally     Objective: Vital Signs: BP (!) 154/76   Pulse 74   Resp 14   Ht 5' 10"  (1.778 m)   Wt 215 lb (97.5 kg)   BMI 30.85 kg/m    Physical Exam  Constitutional: He is oriented to person, place, and time. He appears well-developed and well-nourished.  HENT:  Head: Normocephalic and atraumatic.  Eyes: Pupils are equal, round, and reactive to light. Conjunctivae and EOM are normal.  Neck: Normal range of motion. Neck supple.  Cardiovascular: Normal rate, regular rhythm and normal heart sounds.  Exam reveals no gallop and no friction rub.   No murmur heard. Pulmonary/Chest: Effort normal and breath sounds normal. No respiratory distress. He has no wheezes. He has no rales. He exhibits no tenderness.  Abdominal: Soft. He exhibits no distension and no mass.  There is no tenderness. There is no guarding.  Musculoskeletal: Normal range of motion.  Lymphadenopathy:    He has no cervical adenopathy.  Neurological: He is alert and oriented to person, place, and time. He exhibits normal muscle tone. Coordination normal.  Skin: Skin is warm and dry. Capillary refill takes less than 2 seconds. No rash noted.  Psychiatric: He has a normal mood and affect. His behavior is normal. Judgment and thought content normal.  Vitals reviewed.    Musculoskeletal Exam:  Full range of motion of all joints Grip strength is equal and strong bilaterally Fiber myalgia tender points are all absent  CDAI Exam: No CDAI exam completed.  No synovitis on examination No joint pain, stiffness, swelling  Investigation: No additional findings. Admission on 06/22/2016, Discharged on 06/24/2016  Component Date Value Ref Range Status  . Prothrombin Time 06/22/2016 12.4  11.4 - 15.2 seconds Final  . INR 06/22/2016 0.93   Final  . aPTT 06/22/2016 30  24 - 36 seconds Final  .  WBC 06/22/2016 7.6  4.0 - 10.5 K/uL Final  . RBC 06/22/2016 4.95  4.22 - 5.81 MIL/uL Final  . Hemoglobin 06/22/2016 15.5  13.0 - 17.0 g/dL Final  . HCT 06/22/2016 46.9  39.0 - 52.0 % Final  . MCV 06/22/2016 94.7  78.0 - 100.0 fL Final  . MCH 06/22/2016 31.3  26.0 - 34.0 pg Final  . MCHC 06/22/2016 33.0  30.0 - 36.0 g/dL Final  . RDW 06/22/2016 13.9  11.5 - 15.5 % Final  . Platelets 06/22/2016 183  150 - 400 K/uL Final  . Neutrophils Relative % 06/22/2016 46  % Final  . Neutro Abs 06/22/2016 3.4  1.7 - 7.7 K/uL Final  . Lymphocytes Relative 06/22/2016 37  % Final  . Lymphs Abs 06/22/2016 2.8  0.7 - 4.0 K/uL Final  . Monocytes Relative 06/22/2016 15  % Final  . Monocytes Absolute 06/22/2016 1.2* 0.1 - 1.0 K/uL Final  . Eosinophils Relative 06/22/2016 2  % Final  . Eosinophils Absolute 06/22/2016 0.2  0.0 - 0.7 K/uL Final  . Basophils Relative 06/22/2016 0  % Final  . Basophils Absolute 06/22/2016  0.0  0.0 - 0.1 K/uL Final  . Sodium 06/22/2016 141  135 - 145 mmol/L Final  . Potassium 06/22/2016 3.7  3.5 - 5.1 mmol/L Final  . Chloride 06/22/2016 106  101 - 111 mmol/L Final  . CO2 06/22/2016 27  22 - 32 mmol/L Final  . Glucose, Bld 06/22/2016 98  65 - 99 mg/dL Final  . BUN 06/22/2016 11  6 - 20 mg/dL Final  . Creatinine, Ser 06/22/2016 0.84  0.61 - 1.24 mg/dL Final  . Calcium 06/22/2016 9.2  8.9 - 10.3 mg/dL Final  . Total Protein 06/22/2016 6.6  6.5 - 8.1 g/dL Final  . Albumin 06/22/2016 4.2  3.5 - 5.0 g/dL Final  . AST 06/22/2016 28  15 - 41 U/L Final  . ALT 06/22/2016 22  17 - 63 U/L Final  . Alkaline Phosphatase 06/22/2016 77  38 - 126 U/L Final  . Total Bilirubin 06/22/2016 0.5  0.3 - 1.2 mg/dL Final  . GFR calc non Af Amer 06/22/2016 >60  >60 mL/min Final  . GFR calc Af Amer 06/22/2016 >60  >60 mL/min Final   Comment: (NOTE) The eGFR has been calculated using the CKD EPI equation. This calculation has not been validated in all clinical situations. eGFR's persistently <60 mL/min signify possible Chronic Kidney Disease.   . Anion gap 06/22/2016 8  5 - 15 Final  . Troponin i, poc 06/22/2016 0.01  0.00 - 0.08 ng/mL Final  . Comment 3 06/22/2016          Final   Comment: Due to the release kinetics of cTnI, a negative result within the first hours of the onset of symptoms does not rule out myocardial infarction with certainty. If myocardial infarction is still suspected, repeat the test at appropriate intervals.   . Glucose-Capillary 06/22/2016 90  65 - 99 mg/dL Final  . Sodium 06/22/2016 141  135 - 145 mmol/L Final  . Potassium 06/22/2016 3.8  3.5 - 5.1 mmol/L Final  . Chloride 06/22/2016 105  101 - 111 mmol/L Final  . BUN 06/22/2016 14  6 - 20 mg/dL Final  . Creatinine, Ser 06/22/2016 0.80  0.61 - 1.24 mg/dL Final  . Glucose, Bld 06/22/2016 96  65 - 99 mg/dL Final  . Calcium, Ion 06/22/2016 1.12* 1.15 - 1.40 mmol/L Final  . TCO2 06/22/2016 28  0 -  100 mmol/L Final  .  Hemoglobin 06/22/2016 16.7  13.0 - 17.0 g/dL Final  . HCT 06/22/2016 49.0  39.0 - 52.0 % Final  . Hgb A1c MFr Bld 06/23/2016 5.8* 4.8 - 5.6 % Final   Comment: (NOTE)         Pre-diabetes: 5.7 - 6.4         Diabetes: >6.4         Glycemic control for adults with diabetes: <7.0   . Mean Plasma Glucose 06/23/2016 120  mg/dL Final   Comment: (NOTE) Performed At: [facility] [place], [place] [id number] Lindon Romp MD JA:[id number]   . Cholesterol 06/23/2016 115  0 - 200 mg/dL Final  . Triglycerides 06/23/2016 80  <150 mg/dL Final  . HDL 06/23/2016 40* >40 mg/dL Final  . Total CHOL/HDL Ratio 06/23/2016 2.9  RATIO Final  . VLDL 06/23/2016 16  0 - 40 mg/dL Final  . LDL Cholesterol 06/23/2016 59  0 - 99 mg/dL Final   Comment:        Total Cholesterol/HDL:CHD Risk Coronary Heart Disease Risk Table                     Men   Women  1/2 Average Risk   3.4   3.3  Average Risk       5.0   4.4  2 X Average Risk   9.6   7.1  3 X Average Risk  23.4   11.0        Use the calculated Patient Ratio above and the CHD Risk Table to determine the patient's CHD Risk.        ATP III CLASSIFICATION (LDL):  <100     mg/dL   Optimal  100-129  mg/dL   Near or Above                    Optimal  130-159  mg/dL   Borderline  160-189  mg/dL   High  >190     mg/dL   Very High   . Glucose-Capillary 06/22/2016 92  65 - 99 mg/dL Final  . Comment 1 06/22/2016 Notify RN   Final  . Comment 2 06/22/2016 Document in Chart   Final  . Right CCA prox sys 06/24/2016 76  cm/s Final  . Right CCA prox dias 06/24/2016 6  cm/s Final  . Right cca dist sys 06/24/2016 -79  cm/s Final  . Left CCA prox sys 06/24/2016 100  cm/s Final  . Left CCA prox dias 06/24/2016 20  cm/s Final  . Left CCA dist sys 06/24/2016 -130  cm/s Final  . Left CCA dist dias 06/24/2016 -21  cm/s Final  . Left ICA prox sys 06/24/2016 -66  cm/s Final  . Left ICA prox dias 06/24/2016 -14  cm/s Final  . Left ICA dist sys  06/24/2016 -107  cm/s Final  . Left ICA dist dias 06/24/2016 -26  cm/s Final  . RIGHT ECA DIAS 06/24/2016 -2.00  cm/s Final  . RIGHT VERTEBRAL DIAS 06/24/2016 13.00  cm/s Final  . LEFT ECA DIAS 06/24/2016 -11.00  cm/s Final  . LEFT VERTEBRAL DIAS 06/24/2016 17.00  cm/s Final  . Glucose-Capillary 06/23/2016 137* 65 - 99 mg/dL Final  . Comment 1 06/23/2016 Notify RN   Final  . Comment 2 06/23/2016 Document in Chart   Final  . Glucose-Capillary 06/23/2016 209* 65 - 99 mg/dL Final  . Comment 1 06/23/2016 Notify  RN   Final  . Comment 2 06/23/2016 Document in Chart   Final  . Weight 06/23/2016 3428.8  oz Final  . Height 06/23/2016 70  in Final  . BP 06/23/2016 138/62  mmHg Final  . WBC 06/24/2016 7.8  4.0 - 10.5 K/uL Final  . RBC 06/24/2016 5.01  4.22 - 5.81 MIL/uL Final  . Hemoglobin 06/24/2016 15.6  13.0 - 17.0 g/dL Final  . HCT 06/24/2016 46.9  39.0 - 52.0 % Final  . MCV 06/24/2016 93.6  78.0 - 100.0 fL Final  . MCH 06/24/2016 31.1  26.0 - 34.0 pg Final  . MCHC 06/24/2016 33.3  30.0 - 36.0 g/dL Final  . RDW 06/24/2016 13.8  11.5 - 15.5 % Final  . Platelets 06/24/2016 160  150 - 400 K/uL Final  . Sodium 06/24/2016 139  135 - 145 mmol/L Final  . Potassium 06/24/2016 4.0  3.5 - 5.1 mmol/L Final  . Chloride 06/24/2016 102  101 - 111 mmol/L Final  . CO2 06/24/2016 26  22 - 32 mmol/L Final  . Glucose, Bld 06/24/2016 181* 65 - 99 mg/dL Final  . BUN 06/24/2016 15  6 - 20 mg/dL Final  . Creatinine, Ser 06/24/2016 0.82  0.61 - 1.24 mg/dL Final  . Calcium 06/24/2016 9.3  8.9 - 10.3 mg/dL Final  . GFR calc non Af Amer 06/24/2016 >60  >60 mL/min Final  . GFR calc Af Amer 06/24/2016 >60  >60 mL/min Final   Comment: (NOTE) The eGFR has been calculated using the CKD EPI equation. This calculation has not been validated in all clinical situations. eGFR's persistently <60 mL/min signify possible Chronic Kidney Disease.   . Anion gap 06/24/2016 11  5 - 15 Final  . Glucose-Capillary 06/23/2016 150*  65 - 99 mg/dL Final  . Glucose-Capillary 06/23/2016 176* 65 - 99 mg/dL Final  . Comment 1 06/23/2016 Notify RN   Final  . Comment 2 06/23/2016 Document in Chart   Final  . Glucose-Capillary 06/24/2016 187* 65 - 99 mg/dL Final  . Comment 1 06/24/2016 Notify RN   Final  . Comment 2 06/24/2016 Document in Chart   Final  . Glucose-Capillary 06/24/2016 208* 65 - 99 mg/dL Final  . Comment 1 06/24/2016 Notify RN   Final  . Comment 2 06/24/2016 Document in Chart   Final  Office Visit on 06/08/2016  Component Date Value Ref Range Status  . WBC 06/08/2016 7.0  3.8 - 10.8 K/uL Final  . RBC 06/08/2016 5.04  4.20 - 5.80 MIL/uL Final  . Hemoglobin 06/08/2016 15.7  13.2 - 17.1 g/dL Final  . HCT 06/08/2016 47.7  38.5 - 50.0 % Final  . MCV 06/08/2016 94.6  80.0 - 100.0 fL Final  . MCH 06/08/2016 31.2  27.0 - 33.0 pg Final  . MCHC 06/08/2016 32.9  32.0 - 36.0 g/dL Final  . RDW 06/08/2016 13.9  11.0 - 15.0 % Final  . Platelets 06/08/2016 169  140 - 400 K/uL Final  . MPV 06/08/2016 9.5  7.5 - 12.5 fL Final  . Neutro Abs 06/08/2016 3220  1,500 - 7,800 cells/uL Final  . Lymphs Abs 06/08/2016 2660  850 - 3,900 cells/uL Final  . Monocytes Absolute 06/08/2016 980* 200 - 950 cells/uL Final  . Eosinophils Absolute 06/08/2016 140  15 - 500 cells/uL Final  . Basophils Absolute 06/08/2016 0  0 - 200 cells/uL Final  . Neutrophils Relative % 06/08/2016 46  % Final  . Lymphocytes Relative 06/08/2016 38  %  Final  . Monocytes Relative 06/08/2016 14  % Final  . Eosinophils Relative 06/08/2016 2  % Final  . Basophils Relative 06/08/2016 0  % Final  . Smear Review 06/08/2016 Criteria for review not met   Final  . Sodium 06/08/2016 140  135 - 146 mmol/L Final  . Potassium 06/08/2016 4.4  3.5 - 5.3 mmol/L Final  . Chloride 06/08/2016 106  98 - 110 mmol/L Final  . CO2 06/08/2016 25  20 - 31 mmol/L Final  . Glucose, Bld 06/08/2016 138* 65 - 99 mg/dL Final  . BUN 06/08/2016 19  7 - 25 mg/dL Final  . Creat 06/08/2016  0.80  0.70 - 1.25 mg/dL Final   Comment:   For patients > or = 68 years of age: The upper reference limit for Creatinine is approximately 13% higher for people identified as African-American.     . Total Bilirubin 06/08/2016 0.4  0.2 - 1.2 mg/dL Final  . Alkaline Phosphatase 06/08/2016 84  40 - 115 U/L Final  . AST 06/08/2016 20  10 - 35 U/L Final  . ALT 06/08/2016 17  9 - 46 U/L Final  . Total Protein 06/08/2016 6.5  6.1 - 8.1 g/dL Final  . Albumin 06/08/2016 4.0  3.6 - 5.1 g/dL Final  . Calcium 06/08/2016 9.3  8.6 - 10.3 mg/dL Final  . GFR, Est African American 06/08/2016 >89  >=60 mL/min Final  . GFR, Est Non African American 06/08/2016 >89  >=60 mL/min Final  . Prescribed Drug 1 06/08/2016 Tramadol   Final  . Creatinine 06/08/2016 178.9  > or = 20.0 mg/dL Final  . pH 06/08/2016 6.20  4.5 - 9.0 Final  . Oxidant 06/08/2016 NEGATIVE  <200 mcg/mL Final  . Amphetamines 06/08/2016 NEGATIVE  <500 ng/mL Final  . medMATCH Amphetamines 06/08/2016 CONSISTENT   Final  . Barbiturates 06/08/2016 NEGATIVE  <300 ng/mL Final  . medMATCH Barbiturates 06/08/2016 CONSISTENT   Final  . Benzodiazepines 06/08/2016 NEGATIVE  <100 ng/mL Final  . medMATCH Benzodiazepines 06/08/2016 CONSISTENT   Final  . Marijuana Metabolite 06/08/2016 NEGATIVE  <20 ng/mL Final  . medMATCH Marijuana Metab 06/08/2016 CONSISTENT   Final  . Cocaine Metabolite 06/08/2016 NEGATIVE  <150 ng/mL Final  . medMATCH Cocaine Metab 06/08/2016 CONSISTENT   Final  . Methadone Metabolite 06/08/2016 NEGATIVE  <100 ng/mL Final  . [facility] Methadone Metab 06/08/2016 CONSISTENT   Final  . Opiates 06/08/2016 NEGATIVE  <100 ng/mL Final  . medMATCH Opiates 06/08/2016 CONSISTENT   Final  . Oxycodone 06/08/2016 NEGATIVE  <100 ng/mL Final  . medMATCH Oxycodone 06/08/2016 CONSISTENT   Final  . See Note: 06/08/2016     Final   Comment: This drug testing is for medical treatment only.   Analysis was performed as non-forensic testing and  these  results should be used only by healthcare  providers to render diagnosis or treatment, or to  monitor progress of medical conditions.   [facility] comments are:  - present when drug test results may be the result of     metabolism of one or more drugs or when results are     inconsistent with prescribed medication(s) listed.  - may be blank when drug results are consistent with     prescribed medication(s) listed.   For assistance with interpreting these drug results,  please contact a [employer] Toxicology  Specialist: [phone] [place] [phone]), M-F,  8am-6pm EST.   This drug testing is for medical treatment only. Analysis was performed as non-forensic  testing and these results should be used only by healthcare providers to render diagnosis or treatment, or to monitor progress of medical conditions.   For assistance with interpreting these dr                          ug results, please contact a [employer] Toxicology Specialist: [phone] [place] [phone]), M-F, 8am-6pm EST.        Imaging: No results found.  Speciality Comments: No specialty comments available.    Procedures:  No procedures performed Allergies: Avandia [rosiglitazone maleate]   Assessment / Plan:     Visit Diagnoses: Inflammatory arthritis - Plan: CBC with Differential/Platelet, COMPLETE METABOLIC PANEL WITH GFR  ANA positive - Plan: CBC with Differential/Platelet, COMPLETE METABOLIC PANEL WITH GFR  High risk medication use - Plan: CBC with Differential/Platelet, COMPLETE METABOLIC PANEL WITH GFR  Primary osteoarthritis of both knees  Primary osteoarthritis of both hands   Plan: #1: Inflammatory arthritis. No flare. No synovitis. No joint pain, swelling, stiffness.  #2: High risk prescription. Sulfasalazine 2 pills twice a day. Apical response.  #3, OA of bilateral knees. Doing well currently. Getting Visco supplementation with Dr. Estanislado Pandy. Last injection  was a week ago. Next injection will be next week. He's getting Hyalgan's(5 injections in the series for each knee.).  #4: OA of bilateral hands.  #5: CBC with differential and CMP with GFR due now.  Orders: Orders Placed This Encounter  Procedures  . CBC with Differential/Platelet  . COMPLETE METABOLIC PANEL WITH GFR   Meds ordered this encounter  Medications  . traMADol (ULTRAM) 50 MG tablet    Sig: Take 1 tablet (50 mg total) by mouth 2 (two) times daily.    Dispense:  60 tablet    Refill:  2    Order Specific Question:   Supervising Provider    Answer:   Bo Merino [[id number]]  . DISCONTD: methocarbamol (ROBAXIN) 500 MG tablet    Sig: Take 1 tablet (500 mg total) by mouth 2 (two) times daily as needed (for muscle spasm).    Dispense:  602 tablet    Refill:  2    Order Specific Question:   Supervising Provider    Answer:   Bo Merino [[id number]]  . methocarbamol (ROBAXIN) 500 MG tablet    Sig: Take 1 tablet (500 mg total) by mouth 2 (two) times daily as needed (for muscle spasm).    Dispense:  60 tablet    Refill:  2    Order Specific Question:   Supervising Provider    Answer:   Bo Merino [phone]    Face-to-face time spent with patient was 30 minutes. 50% of time was spent in counseling and coordination of care.  Follow-Up Instructions: Return in about 5 months (around 03/28/2017) for RA,ssz2bid/oakj,kjpain,hx stents,plavix,.   Eliezer Lofts, PA-C  Note - This record has been created using [employer].  Chart creation errors have been sought, but may not always  have been located. Such creation errors do not reflect on  the standard of medical care.

## 2016-10-27 ENCOUNTER — Ambulatory Visit (INDEPENDENT_AMBULATORY_CARE_PROVIDER_SITE_OTHER): Payer: Medicare Other | Admitting: Rheumatology

## 2016-10-27 ENCOUNTER — Telehealth: Payer: Self-pay

## 2016-10-27 ENCOUNTER — Ambulatory Visit: Payer: Medicare Other | Admitting: Rheumatology

## 2016-10-27 DIAGNOSIS — M170 Bilateral primary osteoarthritis of knee: Secondary | ICD-10-CM | POA: Diagnosis not present

## 2016-10-27 LAB — COMPLETE METABOLIC PANEL WITH GFR
ALT: 17 U/L (ref 9–46)
AST: 18 U/L (ref 10–35)
Albumin: 4.2 g/dL (ref 3.6–5.1)
Alkaline Phosphatase: 76 U/L (ref 40–115)
BILIRUBIN TOTAL: 0.4 mg/dL (ref 0.2–1.2)
BUN: 15 mg/dL (ref 7–25)
CO2: 27 mmol/L (ref 20–32)
Calcium: 9.5 mg/dL (ref 8.6–10.3)
Chloride: 105 mmol/L (ref 98–110)
Creat: 0.87 mg/dL (ref 0.70–1.25)
GFR, EST NON AFRICAN AMERICAN: 89 mL/min (ref >=60)
GFR, Est African American: >89 mL/min (ref >=60)
GLUCOSE: 124 mg/dL — AB (ref 65–99)
Potassium: 4.4 mmol/L (ref 3.5–5.3)
SODIUM: 143 mmol/L (ref 135–146)
TOTAL PROTEIN: 6.2 g/dL (ref 6.1–8.1)

## 2016-10-27 MED ORDER — LIDOCAINE HCL 1 % IJ SOLN
1.5000 mL | INTRAMUSCULAR | Status: AC | PRN
  Administered 2016-10-27: 1.5 mL

## 2016-10-27 MED ORDER — SODIUM HYALURONATE (VISCOSUP) 20 MG/2ML IX SOSY
20.0000 mg | PREFILLED_SYRINGE | INTRA_ARTICULAR | Status: AC | PRN
  Administered 2016-10-27: 20 mg via INTRA_ARTICULAR

## 2016-10-27 NOTE — Progress Notes (Signed)
   Procedure Note  Patient: Alan Wang             Date of Birth: [birth year]           MRN: [id number]             Visit Date: 10/27/2016  Procedures: Visit Diagnoses: Bilateral primary osteoarthritis of knee - Plan: Large Joint Injection/Arthrocentesis, Large Joint Injection/Arthrocentesis Hyalgan #2 Bilateral knees  Large Joint Inj Date/Time: 10/27/2016 3:15 PM Performed by: Bo Merino Authorized by: Bo Merino   Consent Given by:  Patient Site marked: the procedure site was marked   Timeout: prior to procedure the correct patient, procedure, and site was verified   Indications:  Pain Location:  Knee Site:  R knee Prep: patient was prepped and draped in usual sterile fashion   Needle Size:  27 G Needle Length:  1.5 inches Ultrasound Guidance: No   Fluoroscopic Guidance: No   Arthrogram: No   Medications:  20 mg Sodium Hyaluronate 20 MG/2ML; 1.5 mL lidocaine 1 % Aspiration Attempted: Yes   Aspirate amount (mL):  0 Patient tolerance:  Patient tolerated the procedure well with no immediate complications Large Joint Inj Date/Time: 10/27/2016 3:16 PM Performed by: Bo Merino Authorized by: Bo Merino   Consent Given by:  Patient Site marked: the procedure site was marked   Timeout: prior to procedure the correct patient, procedure, and site was verified   Indications:  Pain Location:  Knee Site:  L knee Prep: patient was prepped and draped in usual sterile fashion   Needle Size:  27 G Needle Length:  1.5 inches Ultrasound Guidance: No   Fluoroscopic Guidance: No   Arthrogram: No   Medications:  20 mg Sodium Hyaluronate 20 MG/2ML; 1.5 mL lidocaine 1 % Aspiration Attempted: Yes   Aspirate amount (mL):  0 Patient tolerance:  Patient tolerated the procedure well with no immediate complications    Bo Merino, MD

## 2016-10-27 NOTE — Telephone Encounter (Signed)
A prior authorization for Methocarbamol has been submitted to patients insurance via CoverMyMeds. Will update once we receive a response.  Mychael Soots, [place], CPhT 10:19 AM

## 2016-10-27 NOTE — Telephone Encounter (Signed)
Received a fax from [employer] regarding a prior authorization approval for methocarbamol from 07/29/2016 to 10/27/17.   Reference [id number] Phone number:[phone]  Will scan document into epic.  Called patient to update him. Spoke to [place], his wife .She voices understanding and denied any questions at this time.  Annita Ratliff, [place], CPhT 12:18 PM

## 2016-11-01 NOTE — Progress Notes (Signed)
   Procedure Note  Patient: Alan Wang             Date of Birth: [birth year]           MRN: [id number]             Visit Date: 11/03/2016  Procedures: Visit Diagnoses: Primary osteoarthritis of both knees - Plan: Large Joint Injection/Arthrocentesis, Large Joint Injection/Arthrocentesis  Pain in joint, multiple sites  Abnormal levels of other serum enzymes -patient had abnormal IFE in the past. Her his oncologist recommendation we will repeat his IFE today. Plan: Protein Electrophoresis, (serum) Hyalgan #3 bilateral knees  Large Joint Inj Date/Time: 11/03/2016 2:11 PM Performed by: Bo Merino Authorized by: Bo Merino   Consent Given by:  Patient Site marked: the procedure site was marked   Timeout: prior to procedure the correct patient, procedure, and site was verified   Indications:  Pain Location:  Knee Site:  R knee Prep: patient was prepped and draped in usual sterile fashion   Needle Size:  27 G Needle Length:  1.5 inches Ultrasound Guidance: No   Fluoroscopic Guidance: No   Arthrogram: No   Medications:  1.5 mL lidocaine 1 %; 20 mg Sodium Hyaluronate 20 MG/2ML Aspiration Attempted: Yes   Patient tolerance:  Patient tolerated the procedure well with no immediate complications Large Joint Inj Date/Time: 11/03/2016 2:11 PM Performed by: Bo Merino Authorized by: Bo Merino   Consent Given by:  Patient Site marked: the procedure site was marked   Timeout: prior to procedure the correct patient, procedure, and site was verified   Indications:  Pain Location:  Knee Site:  L knee Prep: patient was prepped and draped in usual sterile fashion   Needle Size:  27 G Needle Length:  1.5 inches Ultrasound Guidance: No   Fluoroscopic Guidance: No   Arthrogram: No   Medications:  1.5 mL lidocaine 1 %; 20 mg Sodium Hyaluronate 20 MG/2ML Aspiration Attempted: Yes   Patient tolerance:  Patient tolerated the procedure well with no immediate  complications   Bo Merino, MD

## 2016-11-03 ENCOUNTER — Ambulatory Visit (INDEPENDENT_AMBULATORY_CARE_PROVIDER_SITE_OTHER): Payer: Medicare Other | Admitting: Rheumatology

## 2016-11-03 ENCOUNTER — Other Ambulatory Visit: Payer: Self-pay | Admitting: *Deleted

## 2016-11-03 ENCOUNTER — Ambulatory Visit: Payer: Medicare Other | Admitting: Rheumatology

## 2016-11-03 ENCOUNTER — Other Ambulatory Visit: Payer: Self-pay | Admitting: Rheumatology

## 2016-11-03 DIAGNOSIS — M2550 Pain in unspecified joint: Secondary | ICD-10-CM

## 2016-11-03 DIAGNOSIS — R748 Abnormal levels of other serum enzymes: Secondary | ICD-10-CM | POA: Diagnosis not present

## 2016-11-03 DIAGNOSIS — M170 Bilateral primary osteoarthritis of knee: Secondary | ICD-10-CM

## 2016-11-03 MED ORDER — LIDOCAINE HCL 1 % IJ SOLN
1.5000 mL | INTRAMUSCULAR | Status: AC | PRN
  Administered 2016-11-03: 1.5 mL

## 2016-11-03 MED ORDER — SODIUM HYALURONATE (VISCOSUP) 20 MG/2ML IX SOSY
20.0000 mg | PREFILLED_SYRINGE | INTRA_ARTICULAR | Status: AC | PRN
  Administered 2016-11-03: 20 mg via INTRA_ARTICULAR

## 2016-11-04 NOTE — Progress Notes (Signed)
   Procedure Note  Patient: MCCARTNEY CHUBA             Date of Birth: 11-03-1950           MRN: [id number]             Visit Date: 11/10/2016  Procedures: Visit Diagnoses: Primary osteoarthritis of both knees - Plan: Large Joint Injection/Arthrocentesis, Large Joint Injection/Arthrocentesis Hyalgan #4 bilateral  Large Joint Inj Date/Time: 11/10/2016 10:27 AM Performed by: Bo Merino Authorized by: Bo Merino   Consent Given by:  Patient Site marked: the procedure site was marked   Timeout: prior to procedure the correct patient, procedure, and site was verified   Indications:  Pain Location:  Knee Site:  R knee Prep: patient was prepped and draped in usual sterile fashion   Needle Size:  27 G Needle Length:  1.5 inches Ultrasound Guidance: No   Fluoroscopic Guidance: No   Arthrogram: No   Medications:  1.5 mL lidocaine 1 %; 20 mg Sodium Hyaluronate 20 MG/2ML Aspiration Attempted: Yes   Patient tolerance:  Patient tolerated the procedure well with no immediate complications Large Joint Inj Date/Time: 11/10/2016 10:27 AM Performed by: Bo Merino Authorized by: Bo Merino   Consent Given by:  Patient Site marked: the procedure site was marked   Timeout: prior to procedure the correct patient, procedure, and site was verified   Indications:  Pain Location:  Knee Site:  L knee Prep: patient was prepped and draped in usual sterile fashion   Needle Size:  27 G Needle Length:  1.5 inches Ultrasound Guidance: No   Fluoroscopic Guidance: No   Arthrogram: No   Medications:  1.5 mL lidocaine 1 %; 20 mg Sodium Hyaluronate 20 MG/2ML Aspiration Attempted: Yes   Patient tolerance:  Patient tolerated the procedure well with no immediate complications    Bo Merino, MD

## 2016-11-09 ENCOUNTER — Ambulatory Visit: Payer: Medicare Other | Admitting: Rheumatology

## 2016-11-10 ENCOUNTER — Ambulatory Visit: Payer: Medicare Other | Admitting: Rheumatology

## 2016-11-10 ENCOUNTER — Ambulatory Visit (INDEPENDENT_AMBULATORY_CARE_PROVIDER_SITE_OTHER): Payer: Medicare Other | Admitting: Rheumatology

## 2016-11-10 DIAGNOSIS — M170 Bilateral primary osteoarthritis of knee: Secondary | ICD-10-CM | POA: Diagnosis not present

## 2016-11-10 MED ORDER — LIDOCAINE HCL 1 % IJ SOLN
1.5000 mL | INTRAMUSCULAR | Status: AC | PRN
  Administered 2016-11-10: 1.5 mL

## 2016-11-10 MED ORDER — SODIUM HYALURONATE (VISCOSUP) 20 MG/2ML IX SOSY
20.0000 mg | PREFILLED_SYRINGE | INTRA_ARTICULAR | Status: AC | PRN
  Administered 2016-11-10: 20 mg via INTRA_ARTICULAR

## 2016-11-11 LAB — PROTEIN ELECTROPHORESIS, SERUM, WITH REFLEX
ALBUMIN ELP: 4.2 g/dL (ref 3.8–4.8)
Alpha-1-Globulin: 0.2 g/dL (ref 0.2–0.3)
Alpha-2-Globulin: 0.7 g/dL (ref 0.5–0.9)
BETA GLOBULIN: 0.5 g/dL (ref 0.4–0.6)
Beta 2: 0.3 g/dL (ref 0.2–0.5)
Gamma Globulin: 0.8 g/dL (ref 0.8–1.7)
TOTAL PROTEIN, SERUM ELECTROPHOR: 6.7 g/dL (ref 6.1–8.1)

## 2016-11-11 LAB — IFE INTERPRETATION

## 2016-11-11 NOTE — Progress Notes (Signed)
Please send these results to patient's oncologist. They can advise if patient needed further evaluation.

## 2016-11-17 ENCOUNTER — Ambulatory Visit (INDEPENDENT_AMBULATORY_CARE_PROVIDER_SITE_OTHER): Payer: Medicare Other | Admitting: Rheumatology

## 2016-11-17 DIAGNOSIS — M170 Bilateral primary osteoarthritis of knee: Secondary | ICD-10-CM

## 2016-11-17 MED ORDER — LIDOCAINE HCL 1 % IJ SOLN
1.5000 mL | INTRAMUSCULAR | Status: AC | PRN
  Administered 2016-11-17: 1.5 mL

## 2016-11-17 MED ORDER — SODIUM HYALURONATE (VISCOSUP) 20 MG/2ML IX SOSY
20.0000 mg | PREFILLED_SYRINGE | INTRA_ARTICULAR | Status: AC | PRN
  Administered 2016-11-17: 20 mg via INTRA_ARTICULAR

## 2016-11-17 NOTE — Progress Notes (Signed)
   Procedure Note  Patient: Alan Wang             Date of Birth: [birth year]           MRN: [id number]             Visit Date: 11/17/2016  Procedures: Visit Diagnoses: Primary osteoarthritis of both knees Hyalgan #5 Bilateral KJ B/B Large Joint Inj Date/Time: 11/17/2016 1:53 PM Performed by: Bo Merino Authorized by: Bo Merino   Consent Given by:  Patient Site marked: the procedure site was marked   Timeout: prior to procedure the correct patient, procedure, and site was verified   Indications:  Pain and joint swelling Location:  Knee Site:  R knee Prep: patient was prepped and draped in usual sterile fashion   Needle Size:  27 G Needle Length:  1.5 inches Approach:  Medial Ultrasound Guidance: No   Fluoroscopic Guidance: No   Arthrogram: No   Medications:  1.5 mL lidocaine 1 %; 20 mg Sodium Hyaluronate 20 MG/2ML Aspiration Attempted: Yes   Aspirate amount (mL):  0 Patient tolerance:  Patient tolerated the procedure well with no immediate complications Large Joint Inj Date/Time: 11/17/2016 1:54 PM Performed by: Bo Merino Authorized by: Bo Merino   Consent Given by:  Patient Site marked: the procedure site was marked   Timeout: prior to procedure the correct patient, procedure, and site was verified   Indications:  Pain and joint swelling Location:  Knee Site:  L knee Prep: patient was prepped and draped in usual sterile fashion   Needle Size:  27 G Needle Length:  1.5 inches Approach:  Medial Ultrasound Guidance: No   Fluoroscopic Guidance: No   Arthrogram: No   Medications:  1.5 mL lidocaine 1 %; 20 mg Sodium Hyaluronate 20 MG/2ML Aspiration Attempted: Yes   Patient tolerance:  Patient tolerated the procedure well with no immediate complications    Bo Merino, MD

## 2016-11-25 DIAGNOSIS — G609 Hereditary and idiopathic neuropathy, unspecified: Secondary | ICD-10-CM | POA: Diagnosis not present

## 2016-11-25 DIAGNOSIS — E7800 Pure hypercholesterolemia, unspecified: Secondary | ICD-10-CM | POA: Diagnosis not present

## 2016-11-25 DIAGNOSIS — E1165 Type 2 diabetes mellitus with hyperglycemia: Secondary | ICD-10-CM | POA: Diagnosis not present

## 2016-11-25 DIAGNOSIS — I10 Essential (primary) hypertension: Secondary | ICD-10-CM | POA: Diagnosis not present

## 2016-11-25 DIAGNOSIS — Z23 Encounter for immunization: Secondary | ICD-10-CM | POA: Diagnosis not present

## 2016-12-27 ENCOUNTER — Other Ambulatory Visit: Payer: Self-pay | Admitting: Rheumatology

## 2016-12-27 NOTE — Telephone Encounter (Signed)
Last visit: 10/26/16 Next visit: 04/01/17 Labs: 10/26/16 elevated glucose   Ok to refill per Dr. Estanislado Pandy.

## 2017-02-08 DIAGNOSIS — I10 Essential (primary) hypertension: Secondary | ICD-10-CM | POA: Diagnosis not present

## 2017-02-08 DIAGNOSIS — E119 Type 2 diabetes mellitus without complications: Secondary | ICD-10-CM | POA: Diagnosis not present

## 2017-02-08 DIAGNOSIS — Z8673 Personal history of transient ischemic attack (TIA), and cerebral infarction without residual deficits: Secondary | ICD-10-CM | POA: Diagnosis not present

## 2017-02-08 DIAGNOSIS — I2510 Atherosclerotic heart disease of native coronary artery without angina pectoris: Secondary | ICD-10-CM | POA: Diagnosis not present

## 2017-02-08 DIAGNOSIS — R351 Nocturia: Secondary | ICD-10-CM | POA: Diagnosis not present

## 2017-02-08 DIAGNOSIS — M150 Primary generalized (osteo)arthritis: Secondary | ICD-10-CM | POA: Diagnosis not present

## 2017-02-08 DIAGNOSIS — Z0000 Encounter for general adult medical examination without abnormal findings: Secondary | ICD-10-CM | POA: Diagnosis not present

## 2017-02-08 DIAGNOSIS — N401 Enlarged prostate with lower urinary tract symptoms: Secondary | ICD-10-CM | POA: Diagnosis not present

## 2017-02-08 DIAGNOSIS — Z794 Long term (current) use of insulin: Secondary | ICD-10-CM | POA: Diagnosis not present

## 2017-02-08 DIAGNOSIS — E785 Hyperlipidemia, unspecified: Secondary | ICD-10-CM | POA: Diagnosis not present

## 2017-03-03 DIAGNOSIS — I2510 Atherosclerotic heart disease of native coronary artery without angina pectoris: Secondary | ICD-10-CM | POA: Diagnosis not present

## 2017-03-03 DIAGNOSIS — Z794 Long term (current) use of insulin: Secondary | ICD-10-CM | POA: Diagnosis not present

## 2017-03-03 DIAGNOSIS — R0609 Other forms of dyspnea: Secondary | ICD-10-CM | POA: Diagnosis not present

## 2017-03-03 DIAGNOSIS — E119 Type 2 diabetes mellitus without complications: Secondary | ICD-10-CM | POA: Diagnosis not present

## 2017-03-17 DIAGNOSIS — R29898 Other symptoms and signs involving the musculoskeletal system: Secondary | ICD-10-CM | POA: Diagnosis not present

## 2017-03-20 NOTE — Progress Notes (Signed)
Office Visit Note  Patient: Alan Wang             Date of Birth: Jul 14, 1950           MRN: [id number]             PCP: Aura Dials, MD Referring: Aura Dials, MD Visit Date: 04/01/2017 Occupation: @GUAROCC @    Subjective:   Pain in hands and knees  History of Present Illness: Alan Wang is a 67 y.o. male  with history of inflammatory arthritis, osteoarthritis. He states he has not had any joint swelling recently. He's been having problems with left trigger thumb. He also has some give out sensation in his left knee joint.  Activities of Daily Living:  Patient reports morning stiffness for  0.   Patient Denies nocturnal pain.  Difficulty dressing/grooming: Reports Difficulty climbing stairs: Reports Difficulty getting out of chair: Reports Difficulty using hands for taps, buttons, cutlery, and/or writing: Denies   Review of Systems  Constitutional: Positive for fatigue. Negative for activity change, night sweats and weakness.  HENT: Positive for mouth dryness. Negative for mouth sores and nose dryness.   Eyes: Negative for redness and dryness.  Respiratory: Negative for shortness of breath and difficulty breathing.   Cardiovascular: Negative for chest pain, palpitations, hypertension, irregular heartbeat and swelling in legs/feet.  Gastrointestinal: Positive for heartburn. Negative for constipation and diarrhea.  Endocrine: Negative for cold intolerance and increased urination.  Genitourinary: Negative for difficulty urinating.  Musculoskeletal: Positive for arthralgias, gait problem, joint pain and muscle weakness. Negative for joint swelling, myalgias, morning stiffness, muscle tenderness and myalgias.  Skin: Negative for color change, rash, hair loss, nodules/bumps, skin tightness, ulcers and sensitivity to sunlight.  Allergic/Immunologic: Negative for susceptible to infections.  Neurological: Negative for dizziness, fainting, numbness, memory loss and night  sweats.  Hematological: Negative for bruising/bleeding tendency and swollen glands.  Psychiatric/Behavioral: Negative for depressed mood and sleep disturbance. The patient is not nervous/anxious.     PMFS History:  Patient Active Problem List   Diagnosis Date Noted  . TIA (transient ischemic attack) 06/22/2016  . Type 2 diabetes mellitus without complication ([place]) 32/67/1245  . Primary osteoarthritis of both hands 06/03/2016  . Primary osteoarthritis of both knees 06/03/2016  . Generalized pain 06/03/2016  . Memory difficulties 06/03/2016  . History of diabetes mellitus 06/03/2016  . History of coronary artery disease 06/03/2016  . History of hypertension 06/03/2016  . History of hypercholesterolemia 06/03/2016  . High risk medication use 06/03/2016  . History of high cholesterol 06/03/2016  . ANA positive 06/03/2016  . Inflammatory arthritis 10/29/2015  . Vitamin D deficiency 10/29/2015  . Fatigue 09/24/2015  . MGUS (monoclonal gammopathy of unknown significance) 09/22/2015  . Vertigo 10/31/2013  . Chronic cough 11/16/2012  . CAD (coronary artery disease), native coronary artery 03/08/2011  . Postsurgical percutaneous transluminal coronary angioplasty (PTCA) status 03/08/2011  . Type II or unspecified type diabetes mellitus with other coma, not stated as uncontrolled 03/08/2011  . Hypertension associated with diabetes ([place]) 03/08/2011  . Shortness of breath 03/08/2011  . Mixed hyperlipidemia 03/08/2011    Past Medical History:  Diagnosis Date  . Arthritis   . Asthma   . Coronary artery disease   . Diabetes mellitus    type 2, insulin dependent for 30+ years  . History of kidney stones 11/2012  . Hyperlipemia   . Hypertension   . IDDM (insulin dependent diabetes mellitus) ([place])   . Shortness of breath   .  Stroke ([place])    hx TIA    Family History  Problem Relation Age of Onset  . Emphysema Mother   . COPD Mother   . Alzheimer's disease Father   . Asthma Paternal  Aunt   . Heart attack Paternal Grandfather    Past Surgical History:  Procedure Laterality Date  . ANGIOPLASTY  04/06/2011   x3   . CARDIAC CATHETERIZATION    . CARDIAC CATHETERIZATION N/A 09/16/2015   Procedure: Left Heart Cath and Coronary Angiography;  Surgeon: Adrian Prows, MD;  Location: [facility];  Service: Cardiovascular;  Laterality: N/A;  . cataracts    . CHOLECYSTECTOMY    . HERNIA REPAIR     x2  . LEFT HEART CATHETERIZATION WITH CORONARY ANGIOGRAM N/A 03/08/2011   Procedure: LEFT HEART CATHETERIZATION WITH CORONARY ANGIOGRAM;  Surgeon: Laverda Page, MD;  Location: [facility];  Service: Cardiovascular;  Laterality: N/A;  . PERCUTANEOUS CORONARY STENT INTERVENTION (PCI-S) N/A 04/06/2011   Procedure: PERCUTANEOUS CORONARY STENT INTERVENTION (PCI-S);  Surgeon: Laverda Page, MD;  Location: [facility] CATH [facility];  Service: Cardiovascular;  Laterality: N/A;   Social History   Social History Narrative   Patient lives at home with his spouse.   Caffeine Use: occasionally     Objective: Vital Signs: BP (!) 142/71 (BP Location: Left Arm, Patient Position: Sitting, Cuff Size: Normal)   Pulse 76   Resp 16   Ht 5' 10.5" (1.791 m)   Wt 216 lb (98 kg)   BMI 30.55 kg/m    Physical Exam  Constitutional: He is oriented to person, place, and time. He appears well-developed and well-nourished.  HENT:  Head: Normocephalic and atraumatic.  Eyes: Conjunctivae and EOM are normal. Pupils are equal, round, and reactive to light.  Neck: Normal range of motion. Neck supple.  Cardiovascular: Normal rate, regular rhythm and normal heart sounds.  Pulmonary/Chest: Effort normal and breath sounds normal.  Abdominal: Soft. Bowel sounds are normal.  Neurological: He is alert and oriented to person, place, and time.  Skin: Skin is warm and dry. Capillary refill takes less than 2 seconds.  Psychiatric: He has a normal mood and affect. His behavior is normal.  Nursing note and vitals  reviewed.    Musculoskeletal Exam: C-spine and thoracic lumbar spine good range of motion. Shoulder joints although joints wrist joints are good range of motion. He has some DIP PIP thickening in his hands. No synovitis was noted. He has left trigger thumb. He has crepitus in his bilateral knee joints without any warmth swelling or effusion. No synovitis was noted on examination today.  CDAI Exam: CDAI Homunculus Exam:   Joint Counts:  CDAI Tender Joint count: 0 CDAI Swollen Joint count: 0  Global Assessments:  Patient Global Assessment: 2 Provider Global Assessment: 2  CDAI Calculated Score: 4    Investigation: No additional findings. CBC Latest Ref Rng & Units 10/26/2016 06/24/2016 06/22/2016  WBC 3.8 - 10.8 K/uL 7.0 7.8 -  Hemoglobin 13.2 - 17.1 g/dL 16.0 15.6 16.7  Hematocrit 38.5 - 50.0 % 48.6 46.9 49.0  Platelets 140 - 400 K/uL 184 160 -   CMP Latest Ref Rng & Units 10/26/2016 06/24/2016 06/22/2016  Glucose 65 - 99 mg/dL 124(H) 181(H) 96  BUN 7 - 25 mg/dL 15 15 14   Creatinine 0.70 - 1.25 mg/dL 0.87 0.82 0.80  Sodium 135 - 146 mmol/L 143 139 141  Potassium 3.5 - 5.3 mmol/L 4.4 4.0 3.8  Chloride 98 - 110 mmol/L  105 102 105  CO2 20 - 32 mmol/L 27 26 -  Calcium 8.6 - 10.3 mg/dL 9.5 9.3 -  Total Protein 6.1 - 8.1 g/dL 6.2 - -  Total Bilirubin 0.2 - 1.2 mg/dL 0.4 - -  Alkaline Phos 40 - 115 U/L 76 - -  AST 10 - 35 U/L 18 - -  ALT 9 - 46 U/L 17 - -    Imaging: No results found.  Speciality Comments: No specialty comments available.    Procedures:  No procedures performed Allergies: Avandia [rosiglitazone maleate]   Assessment / Plan:     Visit Diagnoses: Inflammatory arthritis: Patient has no active synovitis on examination today. His been doing really well on sulfasalazine.  High risk medication use - SSZ 500 mg , 2 tabs po bid - Plan: CBC with Differential/Platelet, COMPLETE METABOLIC PANEL WITH GFR, Pain Mgmt, Tramadol w/medMATCH, U, Pain Mgmt, Profile 5 w/Conf,  U, CBC with Differential/Platelet, COMPLETE METABOLIC PANEL WITH GFR. Will check labs today and then every 3 months to monitor for drug toxicity.  Primary osteoarthritis of both hands: Joint protection and muscle strengthening discussed.  Trigger finger of left thumb: I discussed cortisone injection but he declined. I've advised him to use diclofenac gel and if symptoms persist we can inject in future.  Primary osteoarthritis of both knees: He has chronic discomfort in his knee joint. No swelling was noted. Joint protection and muscle strengthening discussed. Patient states due to chronic pain he does take tramadol on when necessary basis and would like to have a refill area  Chronic pain: He is on tramadol 1 tablet by mouth twice a day when necessary. I will renew his narcotic agreement and get UDS today.  History of arthroscopy of left knee  History of coronary artery disease  History of diabetes mellitus  History of hypertension  History of hypercholesterolemia  History of TIA (transient ischemic attack)  MGUS (monoclonal gammopathy of unknown significance)  History of vitamin D deficiency    Orders: Orders Placed This Encounter  Procedures  . CBC with Differential/Platelet  . COMPLETE METABOLIC PANEL WITH GFR  . Pain Mgmt, Tramadol w/medMATCH, U  . Pain Mgmt, Profile 5 w/Conf, U  . CBC with Differential/Platelet  . COMPLETE METABOLIC PANEL WITH GFR   Meds ordered this encounter  Medications  . traMADol (ULTRAM) 50 MG tablet    Sig: Take 1 tablet po bid, as needed.    Dispense:  60 tablet    Refill:  1    Face-to-face time spent with patient was 30 minutes. Greater than 50% of time was spent in counseling and coordination of care.  Follow-Up Instructions: Return in about 5 months (around 08/30/2017) for Inflammatory arthritis, osteoarthritis .   Bo Merino, MD  Note - This record has been created using Editor, commissioning.  Chart creation errors have been  sought, but may not always  have been located. Such creation errors do not reflect on  the standard of medical care.

## 2017-04-01 ENCOUNTER — Encounter: Payer: Self-pay | Admitting: Rheumatology

## 2017-04-01 ENCOUNTER — Ambulatory Visit (INDEPENDENT_AMBULATORY_CARE_PROVIDER_SITE_OTHER): Payer: Medicare Other | Admitting: Rheumatology

## 2017-04-01 VITALS — BP 142/71 | HR 76 | Resp 16 | Ht 70.5 in | Wt 216.0 lb

## 2017-04-01 DIAGNOSIS — Z8673 Personal history of transient ischemic attack (TIA), and cerebral infarction without residual deficits: Secondary | ICD-10-CM

## 2017-04-01 DIAGNOSIS — D472 Monoclonal gammopathy: Secondary | ICD-10-CM

## 2017-04-01 DIAGNOSIS — Z79899 Other long term (current) drug therapy: Secondary | ICD-10-CM

## 2017-04-01 DIAGNOSIS — Z8679 Personal history of other diseases of the circulatory system: Secondary | ICD-10-CM | POA: Diagnosis not present

## 2017-04-01 DIAGNOSIS — M1380 Other specified arthritis, unspecified site: Secondary | ICD-10-CM

## 2017-04-01 DIAGNOSIS — M1990 Unspecified osteoarthritis, unspecified site: Secondary | ICD-10-CM | POA: Diagnosis not present

## 2017-04-01 DIAGNOSIS — M65312 Trigger thumb, left thumb: Secondary | ICD-10-CM | POA: Diagnosis not present

## 2017-04-01 DIAGNOSIS — M170 Bilateral primary osteoarthritis of knee: Secondary | ICD-10-CM | POA: Diagnosis not present

## 2017-04-01 DIAGNOSIS — Z9889 Other specified postprocedural states: Secondary | ICD-10-CM | POA: Diagnosis not present

## 2017-04-01 DIAGNOSIS — M19041 Primary osteoarthritis, right hand: Secondary | ICD-10-CM

## 2017-04-01 DIAGNOSIS — M19042 Primary osteoarthritis, left hand: Secondary | ICD-10-CM | POA: Diagnosis not present

## 2017-04-01 DIAGNOSIS — Z8639 Personal history of other endocrine, nutritional and metabolic disease: Secondary | ICD-10-CM

## 2017-04-01 MED ORDER — TRAMADOL HCL 50 MG PO TABS: ORAL_TABLET | ORAL | 1 refills | Status: DC

## 2017-04-01 NOTE — Patient Instructions (Addendum)
Standing Labs We placed an order today for your standing lab work.    Please come back and get your standing labs in April and every 3 months Knee Exercises Ask your health care provider which exercises are safe for you. Do exercises exactly as told by your health care provider and adjust them as directed. It is normal to feel mild stretching, pulling, tightness, or discomfort as you do these exercises, but you should stop right away if you feel sudden pain or your pain gets worse.Do not begin these exercises until told by your health care provider. STRETCHING AND RANGE OF MOTION EXERCISES These exercises warm up your muscles and joints and improve the movement and flexibility of your knee. These exercises also help to relieve pain, numbness, and tingling. Exercise A: Knee Extension, Prone 1. Lie on your abdomen on a bed. 2. Place your left / right knee just beyond the edge of the surface so your knee is not on the bed. You can put a towel under your left / right thigh just above your knee for comfort. 3. Relax your leg muscles and allow gravity to straighten your knee. You should feel a stretch behind your left / right knee. 4. Hold this position for __________ seconds. 5. Scoot up so your knee is supported between repetitions. Repeat __________ times. Complete this stretch __________ times a day. Exercise B: Knee Flexion, Active  1. Lie on your back with both knees straight. If this causes back discomfort, bend your left / right knee so your foot is flat on the floor. 2. Slowly slide your left / right heel back toward your buttocks until you feel a gentle stretch in the front of your knee or thigh. 3. Hold this position for __________ seconds. 4. Slowly slide your left / right heel back to the starting position. Repeat __________ times. Complete this exercise __________ times a day. Exercise C: Quadriceps, Prone  1. Lie on your abdomen on a firm surface, such as a bed or padded  floor. 2. Bend your left / right knee and hold your ankle. If you cannot reach your ankle or pant leg, loop a belt around your foot and grab the belt instead. 3. Gently pull your heel toward your buttocks. Your knee should not slide out to the side. You should feel a stretch in the front of your thigh and knee. 4. Hold this position for __________ seconds. Repeat __________ times. Complete this stretch __________ times a day. Exercise D: Hamstring, Supine 1. Lie on your back. 2. Loop a belt or towel over the ball of your left / right foot. The ball of your foot is on the walking surface, right under your toes. 3. Straighten your left / right knee and slowly pull on the belt to raise your leg until you feel a gentle stretch behind your knee. ? Do not let your left / right knee bend while you do this. ? Keep your other leg flat on the floor. 4. Hold this position for __________ seconds. Repeat __________ times. Complete this stretch __________ times a day. STRENGTHENING EXERCISES These exercises build strength and endurance in your knee. Endurance is the ability to use your muscles for a long time, even after they get tired. Exercise E: Quadriceps, Isometric  1. Lie on your back with your left / right leg extended and your other knee bent. Put a rolled towel or small pillow under your knee if told by your health care provider. 2. Slowly tense the  muscles in the front of your left / right thigh. You should see your kneecap slide up toward your hip or see increased dimpling just above the knee. This motion will push the back of the knee toward the floor. 3. For __________ seconds, keep the muscle as tight as you can without increasing your pain. 4. Relax the muscles slowly and completely. Repeat __________ times. Complete this exercise __________ times a day. Exercise F: Straight Leg Raises - Quadriceps 1. Lie on your back with your left / right leg extended and your other knee bent. 2. Tense the  muscles in the front of your left / right thigh. You should see your kneecap slide up or see increased dimpling just above the knee. Your thigh may even shake a bit. 3. Keep these muscles tight as you raise your leg 4-6 inches (10-15 cm) off the floor. Do not let your knee bend. 4. Hold this position for __________ seconds. 5. Keep these muscles tense as you lower your leg. 6. Relax your muscles slowly and completely after each repetition. Repeat __________ times. Complete this exercise __________ times a day. Exercise G: Hamstring, Isometric 1. Lie on your back on a firm surface. 2. Bend your left / right knee approximately __________ degrees. 3. Dig your left / right heel into the surface as if you are trying to pull it toward your buttocks. Tighten the muscles in the back of your thighs to dig as hard as you can without increasing any pain. 4. Hold this position for __________ seconds. 5. Release the tension gradually and allow your muscles to relax completely for __________ seconds after each repetition. Repeat __________ times. Complete this exercise __________ times a day. Exercise H: Hamstring Curls  If told by your health care provider, do this exercise while wearing ankle weights. Begin with __________ weights. Then increase the weight by 1 lb (0.5 kg) increments. Do not wear ankle weights that are more than __________. 1. Lie on your abdomen with your legs straight. 2. Bend your left / right knee as far as you can without feeling pain. Keep your hips flat against the floor. 3. Hold this position for __________ seconds. 4. Slowly lower your leg to the starting position.  Repeat __________ times. Complete this exercise __________ times a day. Exercise I: Squats (Quadriceps) 1. Stand in front of a table, with your feet and knees pointing straight ahead. You may rest your hands on the table for balance but not for support. 2. Slowly bend your knees and lower your hips like you are going  to sit in a chair. ? Keep your weight over your heels, not over your toes. ? Keep your lower legs upright so they are parallel with the table legs. ? Do not let your hips go lower than your knees. ? Do not bend lower than told by your health care provider. ? If your knee pain increases, do not bend as low. 3. Hold the squat position for __________ seconds. 4. Slowly push with your legs to return to standing. Do not use your hands to pull yourself to standing. Repeat __________ times. Complete this exercise __________ times a day. Exercise J: Wall Slides (Quadriceps)  1. Lean your back against a smooth wall or door while you walk your feet out 18-24 inches (46-61 cm) from it. 2. Place your feet hip-width apart. 3. Slowly slide down the wall or door until your knees bend __________ degrees. Keep your knees over your heels, not over your toes.  Keep your knees in line with your hips. 4. Hold for __________ seconds. Repeat __________ times. Complete this exercise __________ times a day. Exercise K: Straight Leg Raises - Hip Abductors 1. Lie on your side with your left / right leg in the top position. Lie so your head, shoulder, knee, and hip line up. You may bend your bottom knee to help you keep your balance. 2. Roll your hips slightly forward so your hips are stacked directly over each other and your left / right knee is facing forward. 3. Leading with your heel, lift your top leg 4-6 inches (10-15 cm). You should feel the muscles in your outer hip lifting. ? Do not let your foot drift forward. ? Do not let your knee roll toward the ceiling. 4. Hold this position for __________ seconds. 5. Slowly return your leg to the starting position. 6. Let your muscles relax completely after each repetition. Repeat __________ times. Complete this exercise __________ times a day. Exercise L: Straight Leg Raises - Hip Extensors 1. Lie on your abdomen on a firm surface. You can put a pillow under your hips  if that is more comfortable. 2. Tense the muscles in your buttocks and lift your left / right leg about 4-6 inches (10-15 cm). Keep your knee straight as you lift your leg. 3. Hold this position for __________ seconds. 4. Slowly lower your leg to the starting position. 5. Let your leg relax completely after each repetition. Repeat __________ times. Complete this exercise __________ times a day. This information is not intended to replace advice given to you by your health care provider. Make sure you discuss any questions you have with your health care provider. Document Released: 01/06/2005 Document Revised: 11/17/2015 Document Reviewed: 12/29/2014 Elsevier Interactive Patient Education  [employer].   We have open lab Monday through Friday from 8:30-11:30 AM and 1:30-4 PM at the office of Dr. Bo Merino.   The office is located at [address] [place], [place], [place] [address] No appointment is necessary.   Labs are drawn by [facility].  You may receive a bill from [facility] for your lab work. If you have any questions regarding directions or hours of operation,  please call [phone].

## 2017-04-04 NOTE — Progress Notes (Signed)
Please notify patient.

## 2017-04-05 LAB — PAIN MGMT, PROFILE 5 W/CONF, U
Amphetamines: NEGATIVE ng/mL (ref <500)
BENZODIAZEPINES: NEGATIVE ng/mL (ref <100)
Barbiturates: NEGATIVE ng/mL (ref <300)
CREATININE: 71.6 mg/dL
Cocaine Metabolite: NEGATIVE ng/mL (ref <150)
Marijuana Metabolite: NEGATIVE ng/mL (ref <20)
Methadone Metabolite: NEGATIVE ng/mL (ref <100)
OPIATES: NEGATIVE ng/mL (ref <100)
OXYCODONE: NEGATIVE ng/mL (ref <100)
Oxidant: NEGATIVE ug/mL (ref <200)
pH: 5.54 (ref 4.5–9.0)

## 2017-04-05 LAB — COMPLETE METABOLIC PANEL WITH GFR
AG RATIO: 1.8 (calc) (ref 1.0–2.5)
ALT: 14 U/L (ref 9–46)
AST: 17 U/L (ref 10–35)
Albumin: 4.1 g/dL (ref 3.6–5.1)
Alkaline phosphatase (APISO): 74 U/L (ref 40–115)
BUN: 18 mg/dL (ref 7–25)
CALCIUM: 9.5 mg/dL (ref 8.6–10.3)
CO2: 28 mmol/L (ref 20–32)
Chloride: 106 mmol/L (ref 98–110)
Creat: 0.83 mg/dL (ref 0.70–1.25)
GFR, EST AFRICAN AMERICAN: 105 mL/min/{1.73_m2} (ref >=60)
GFR, EST NON AFRICAN AMERICAN: 90 mL/min/{1.73_m2} (ref >=60)
GLOBULIN: 2.3 g/dL (ref 1.9–3.7)
Glucose, Bld: 124 mg/dL — ABNORMAL HIGH (ref 65–99)
POTASSIUM: 4.8 mmol/L (ref 3.5–5.3)
Sodium: 144 mmol/L (ref 135–146)
Total Bilirubin: 0.3 mg/dL (ref 0.2–1.2)
Total Protein: 6.4 g/dL (ref 6.1–8.1)

## 2017-04-05 LAB — CBC WITH DIFFERENTIAL/PLATELET
BASOS PCT: 0.8 %
Basophils Absolute: 67 cells/uL (ref 0–200)
Eosinophils Absolute: 109 cells/uL (ref 15–500)
Eosinophils Relative: 1.3 %
HEMATOCRIT: 49.2 % (ref 38.5–50.0)
Hemoglobin: 16.9 g/dL (ref 13.2–17.1)
LYMPHS ABS: 2957 {cells}/uL (ref 850–3900)
MCH: 31.4 pg (ref 27.0–33.0)
MCHC: 34.3 g/dL (ref 32.0–36.0)
MCV: 91.4 fL (ref 80.0–100.0)
MPV: 9.4 fL (ref 7.5–12.5)
Monocytes Relative: 10.9 %
NEUTROS ABS: 4351 {cells}/uL (ref 1500–7800)
Neutrophils Relative %: 51.8 %
PLATELETS: 203 10*3/uL (ref 140–400)
RBC: 5.38 10*6/uL (ref 4.20–5.80)
RDW: 12.7 % (ref 11.0–15.0)
Total Lymphocyte: 35.2 %
WBC: 8.4 10*3/uL (ref 3.8–10.8)
WBCMIX: 916 {cells}/uL (ref 200–950)

## 2017-04-05 LAB — PAIN MGMT, TRAMADOL W/MEDMATCH, U
DESMETHYLTRAMADOL: NEGATIVE ng/mL (ref <100)
TRAMADOL: NEGATIVE ng/mL (ref <100)

## 2017-04-11 ENCOUNTER — Telehealth: Payer: Self-pay | Admitting: Rheumatology

## 2017-04-11 MED ORDER — SULFASALAZINE 500 MG PO TBEC: 1000.0000 mg | DELAYED_RELEASE_TABLET | Freq: Two times a day (BID) | ORAL | 0 refills | Status: DC

## 2017-04-11 NOTE — Telephone Encounter (Signed)
Patient's wife Alan Wang called requesting prescription refill for Sulfasalazine 500 mg 2 pills/ twice per day  90 day supply.  Prescription should be sent to [facility].

## 2017-04-11 NOTE — Telephone Encounter (Signed)
Last visit: 04/01/2017 Next visit: 10/04/2017 Labs: 04/01/2017 Elevated glucose, rest of labs WNL  Okay to refill per Dr. Estanislado Pandy.    Patient notified that rx has been sent to the pharmacy.

## 2017-05-26 DIAGNOSIS — E11319 Type 2 diabetes mellitus with unspecified diabetic retinopathy without macular edema: Secondary | ICD-10-CM | POA: Diagnosis not present

## 2017-05-26 DIAGNOSIS — M791 Myalgia, unspecified site: Secondary | ICD-10-CM | POA: Diagnosis not present

## 2017-05-26 DIAGNOSIS — M792 Neuralgia and neuritis, unspecified: Secondary | ICD-10-CM | POA: Diagnosis not present

## 2017-05-26 DIAGNOSIS — E7800 Pure hypercholesterolemia, unspecified: Secondary | ICD-10-CM | POA: Diagnosis not present

## 2017-05-26 DIAGNOSIS — I10 Essential (primary) hypertension: Secondary | ICD-10-CM | POA: Diagnosis not present

## 2017-05-26 DIAGNOSIS — E1165 Type 2 diabetes mellitus with hyperglycemia: Secondary | ICD-10-CM | POA: Diagnosis not present

## 2017-05-26 DIAGNOSIS — G609 Hereditary and idiopathic neuropathy, unspecified: Secondary | ICD-10-CM | POA: Diagnosis not present

## 2017-05-26 DIAGNOSIS — R5383 Other fatigue: Secondary | ICD-10-CM | POA: Diagnosis not present

## 2017-05-26 DIAGNOSIS — E1139 Type 2 diabetes mellitus with other diabetic ophthalmic complication: Secondary | ICD-10-CM | POA: Diagnosis not present

## 2017-05-26 DIAGNOSIS — E559 Vitamin D deficiency, unspecified: Secondary | ICD-10-CM | POA: Diagnosis not present

## 2017-06-28 ENCOUNTER — Other Ambulatory Visit: Payer: Self-pay | Admitting: Rheumatology

## 2017-06-28 ENCOUNTER — Other Ambulatory Visit: Payer: Self-pay | Admitting: *Deleted

## 2017-06-28 DIAGNOSIS — Z79899 Other long term (current) drug therapy: Secondary | ICD-10-CM | POA: Diagnosis not present

## 2017-06-28 LAB — CBC WITH DIFFERENTIAL/PLATELET
BASOS ABS: 28 {cells}/uL (ref 0–200)
Basophils Relative: 0.5 %
EOS ABS: 88 {cells}/uL (ref 15–500)
Eosinophils Relative: 1.6 %
HCT: 48.9 % (ref 38.5–50.0)
HEMOGLOBIN: 16.4 g/dL (ref 13.2–17.1)
Lymphs Abs: 2085 cells/uL (ref 850–3900)
MCH: 30.9 pg (ref 27.0–33.0)
MCHC: 33.5 g/dL (ref 32.0–36.0)
MCV: 92.3 fL (ref 80.0–100.0)
MONOS PCT: 12.2 %
MPV: 9.4 fL (ref 7.5–12.5)
NEUTROS ABS: 2629 {cells}/uL (ref 1500–7800)
Neutrophils Relative %: 47.8 %
Platelets: 178 10*3/uL (ref 140–400)
RBC: 5.3 10*6/uL (ref 4.20–5.80)
RDW: 12.7 % (ref 11.0–15.0)
Total Lymphocyte: 37.9 %
WBC mixed population: 671 cells/uL (ref 200–950)
WBC: 5.5 10*3/uL (ref 3.8–10.8)

## 2017-06-28 LAB — COMPLETE METABOLIC PANEL WITH GFR
AG RATIO: 1.8 (calc) (ref 1.0–2.5)
ALBUMIN MSPROF: 4.2 g/dL (ref 3.6–5.1)
ALT: 14 U/L (ref 9–46)
AST: 17 U/L (ref 10–35)
Alkaline phosphatase (APISO): 72 U/L (ref 40–115)
BUN: 16 mg/dL (ref 7–25)
CALCIUM: 9.4 mg/dL (ref 8.6–10.3)
CO2: 30 mmol/L (ref 20–32)
Chloride: 106 mmol/L (ref 98–110)
Creat: 0.81 mg/dL (ref 0.70–1.25)
GFR, EST AFRICAN AMERICAN: 106 mL/min/{1.73_m2} (ref >=60)
GFR, EST NON AFRICAN AMERICAN: 91 mL/min/{1.73_m2} (ref >=60)
GLOBULIN: 2.3 g/dL (ref 1.9–3.7)
Glucose, Bld: 153 mg/dL — ABNORMAL HIGH (ref 65–99)
POTASSIUM: 4.6 mmol/L (ref 3.5–5.3)
SODIUM: 143 mmol/L (ref 135–146)
TOTAL PROTEIN: 6.5 g/dL (ref 6.1–8.1)
Total Bilirubin: 0.4 mg/dL (ref 0.2–1.2)

## 2017-06-28 NOTE — Telephone Encounter (Signed)
Last visit: 04/01/17 Next Visit: 10/04/17 Labs: 04/01/17 Elevated glucose, rest of labs WNL  Labs updated 06/28/17  Okay to refill per Dr. Estanislado Pandy

## 2017-07-01 ENCOUNTER — Other Ambulatory Visit: Payer: Self-pay | Admitting: Rheumatology

## 2017-09-13 ENCOUNTER — Other Ambulatory Visit: Payer: Self-pay | Admitting: Rheumatology

## 2017-09-13 NOTE — Telephone Encounter (Signed)
Last Visit: 04/01/17 Next Visit: 10/04/17 UDS: 04/01/17 Narc Agreement: 04/01/17  Okay to refill Tramadol?

## 2017-09-13 NOTE — Telephone Encounter (Signed)
ok 

## 2017-09-21 NOTE — Progress Notes (Signed)
Office Visit Note  Patient: Alan Wang             Date of Birth: Aug 05, 1950           MRN: [id number]             PCP: Aura Dials, MD Referring: Aura Dials, MD Visit Date: 10/04/2017 Occupation: @GUAROCC @  Subjective:  Left knee pain.   History of Present Illness: VINCIENT VANAMAN is a 67 y.o. male history of inflammatory arthritis and osteoarthritis.  He states he has been having pain and discomfort in his left knee.  Has been having difficulty walking long distance and getting up and down the stairs.  None of the other joints are painful or swollen.  Trigger thumb is resolved.  Patient reports that he takes tramadol on PRN basis for pain management.  His pain level without tramadol is about 5 and with tramadol it comes down to 0-1.  Activities of Daily Living:  Patient reports morning stiffness for 0 minutes.   Patient Denies nocturnal pain.  Difficulty dressing/grooming: Reports Difficulty climbing stairs: Reports Difficulty getting out of chair: Reports Difficulty using hands for taps, buttons, cutlery, and/or writing: Denies  Review of Systems  Constitutional: Positive for fatigue.  HENT: Positive for mouth dryness. Negative for ear ringing and nose dryness.   Eyes: Negative for redness and dryness.  Respiratory: Negative for cough, shortness of breath, apnea and difficulty breathing.   Cardiovascular: Positive for swelling in legs/feet. Negative for palpitations and hypertension.  Gastrointestinal: Positive for constipation. Negative for blood in stool and diarrhea.  Endocrine: Negative for increased urination.  Genitourinary: Negative for urgency.  Musculoskeletal: Positive for arthralgias and joint pain. Negative for joint swelling, myalgias, muscle weakness, morning stiffness and myalgias.  Skin: Positive for sensitivity to sunlight. Negative for rash and hair loss.  Allergic/Immunologic: Negative for susceptible to infections.  Neurological: Positive for  dizziness and memory loss. Negative for headaches and weakness.  Hematological: Negative for swollen glands.  Psychiatric/Behavioral: Negative for depressed mood and sleep disturbance. The patient is not nervous/anxious.     PMFS History:  Patient Active Problem List   Diagnosis Date Noted  . TIA (transient ischemic attack) 06/22/2016  . Type 2 diabetes mellitus without complication ([place]) 48/54/6270  . Primary osteoarthritis of both hands 06/03/2016  . Primary osteoarthritis of both knees 06/03/2016  . Generalized pain 06/03/2016  . Memory difficulties 06/03/2016  . History of diabetes mellitus 06/03/2016  . History of coronary artery disease 06/03/2016  . History of hypertension 06/03/2016  . History of hypercholesterolemia 06/03/2016  . High risk medication use 06/03/2016  . History of high cholesterol 06/03/2016  . ANA positive 06/03/2016  . Inflammatory arthritis 10/29/2015  . Vitamin D deficiency 10/29/2015  . Fatigue 09/24/2015  . MGUS (monoclonal gammopathy of unknown significance) 09/22/2015  . Vertigo 10/31/2013  . Chronic cough 11/16/2012  . CAD (coronary artery disease), native coronary artery 03/08/2011  . Postsurgical percutaneous transluminal coronary angioplasty (PTCA) status 03/08/2011  . Type II or unspecified type diabetes mellitus with other coma, not stated as uncontrolled 03/08/2011  . Hypertension associated with diabetes ([place]) 03/08/2011  . Shortness of breath 03/08/2011  . Mixed hyperlipidemia 03/08/2011    Past Medical History:  Diagnosis Date  . Arthritis   . Asthma   . Coronary artery disease   . Diabetes mellitus    type 2, insulin dependent for 30+ years  . History of kidney stones 11/2012  . Hyperlipemia   .  Hypertension   . IDDM (insulin dependent diabetes mellitus) ([place])   . Shortness of breath   . Stroke ([place])    hx TIA    Family History  Problem Relation Age of Onset  . Emphysema Mother   . COPD Mother   . Alzheimer's disease  Father   . Asthma Paternal Aunt   . Heart attack Paternal Grandfather    Past Surgical History:  Procedure Laterality Date  . ANGIOPLASTY  04/06/2011   x3   . CARDIAC CATHETERIZATION    . CARDIAC CATHETERIZATION N/A 09/16/2015   Procedure: Left Heart Cath and Coronary Angiography;  Surgeon: Adrian Prows, MD;  Location: [facility];  Service: Cardiovascular;  Laterality: N/A;  . cataracts    . CHOLECYSTECTOMY    . HERNIA REPAIR     x2  . LEFT HEART CATHETERIZATION WITH CORONARY ANGIOGRAM N/A 03/08/2011   Procedure: LEFT HEART CATHETERIZATION WITH CORONARY ANGIOGRAM;  Surgeon: Laverda Page, MD;  Location: [facility] CATH LAB;  Service: Cardiovascular;  Laterality: N/A;  . PERCUTANEOUS CORONARY STENT INTERVENTION (PCI-S) N/A 04/06/2011   Procedure: PERCUTANEOUS CORONARY STENT INTERVENTION (PCI-S);  Surgeon: Laverda Page, MD;  Location: [facility] CATH LAB;  Service: Cardiovascular;  Laterality: N/A;   Social History   Social History Narrative   Patient lives at home with his spouse.   Caffeine Use: occasionally    Objective: Vital Signs: BP 132/83 (BP Location: Left Arm, Patient Position: Sitting, Cuff Size: Normal)   Pulse 71   Resp 15   Ht 5\' 10"  (1.778 m)   Wt 218 lb (98.9 kg)   BMI 31.28 kg/m    Physical Exam  Constitutional: He is oriented to person, place, and time. He appears well-developed and well-nourished.  HENT:  Head: Normocephalic and atraumatic.  Eyes: Pupils are equal, round, and reactive to light. Conjunctivae and EOM are normal.  Neck: Normal range of motion. Neck supple.  Cardiovascular: Normal rate, regular rhythm and normal heart sounds.  Pulmonary/Chest: Effort normal and breath sounds normal.  Abdominal: Soft. Bowel sounds are normal.  Neurological: He is alert and oriented to person, place, and time.  Skin: Skin is warm and dry. Capillary refill takes less than 2 seconds.  Psychiatric: He has a normal mood and affect. His behavior is normal.  Nursing  note and vitals reviewed.    Musculoskeletal Exam: C-spine thoracic lumbar spine good range of motion.  Shoulder joints elbow joints wrist joints are good range of motion.  He has DIP PIP thickening of his hands consistent with osteoarthritis.  Hip joints knee joints ankles MTPs PIPs were good range of motion.  He has some warmth on palpation of his left knee joint without any effusion.  CDAI Exam: CDAI Homunculus Exam:   Tenderness:  LLE: tibiofemoral  Joint Counts:  CDAI Tender Joint count: 1 CDAI Swollen Joint count: 0  Global Assessments:  Patient Global Assessment: 4 Provider Global Assessment: 3  CDAI Calculated Score: 8   Investigation: No additional findings.  Imaging: No results found.  Recent Labs: Lab Results  Component Value Date   WBC 5.5 06/28/2017   HGB 16.4 06/28/2017   PLT 178 06/28/2017   NA 143 06/28/2017   K 4.6 06/28/2017   CL 106 06/28/2017   CO2 30 06/28/2017   GLUCOSE 153 (H) 06/28/2017   BUN 16 06/28/2017   CREATININE 0.81 06/28/2017   BILITOT 0.4 06/28/2017   ALKPHOS 76 10/26/2016   AST 17 06/28/2017   ALT 14 06/28/2017  PROT 6.5 06/28/2017   ALBUMIN 4.2 10/26/2016   CALCIUM 9.4 06/28/2017   GFRAA 106 06/28/2017    Speciality Comments: No specialty comments available.  Procedures:  No procedures performed Allergies: Avandia [rosiglitazone maleate]   Assessment / Plan:     Visit Diagnoses: Inflammatory arthritis-he has no synovitis on examination.  He has some warmth on palpation of his left knee joint.  He believes he had recent injury.  Will observe for right now.  High risk medication use - SSZ 500 mg , 2 tabs po bid  - Plan: CBC with Differential/Platelet, COMPLETE METABOLIC PANEL WITH GFR, today and then every 3 months to monitor for drug toxicity.  Pain Mgmt, Profile 5 w/Conf, U, Pain Mgmt, Tramadol w/medMATCH, U  Primary osteoarthritis of both hands-joint protection muscle strengthening was discussed.  Primary  osteoarthritis of both knees-he has chronic pain.  He has been having some discomfort in his left knee which was warm to touch.  I offered cortisone but he declined.  Other chronic pain - tramadol 50 mg p.o. twice daily as needed. UDS: 1/25/2019Narc agreement: 04/01/2017  History of arthroscopy of left knee  History of vitamin D deficiency-vitamin D supplement.  Other medical problems are listed as follows:  MGUS (monoclonal gammopathy of unknown significance)  History of coronary artery disease  History of hypertension  History of hypercholesterolemia  History of TIA (transient ischemic attack)  History of diabetes mellitus   Orders: Orders Placed This Encounter  Procedures  . CBC with Differential/Platelet  . COMPLETE METABOLIC PANEL WITH GFR  . Pain Mgmt, Profile 5 w/Conf, U  . Pain Mgmt, Tramadol w/medMATCH, U   Meds ordered this encounter  Medications  . traMADol (ULTRAM) 50 MG tablet    Sig: Take 1 tablet (50 mg total) by mouth 2 (two) times daily.    Dispense:  30 tablet    Refill:  2    Face-to-face time spent with patient was 30 minutes. Greater than 50% of time was spent in counseling and coordination of care.  Follow-Up Instructions: Return in about 5 months (around 03/06/2018) for Inflammatory arthritis, osteoarthritis.   Bo Merino, MD  Note - This record has been created using Editor, commissioning.  Chart creation errors have been sought, but may not always  have been located. Such creation errors do not reflect on  the standard of medical care.

## 2017-09-30 ENCOUNTER — Other Ambulatory Visit: Payer: Self-pay | Admitting: Rheumatology

## 2017-09-30 NOTE — Telephone Encounter (Addendum)
Last Visit: 04/01/17 Next Visit: 10/04/17 Labs: 06/28/17 Glucose is elevated. All other lab values are WNL.  Patient will update labs at appointment on 10/04/17  Okay to refill per Dr. Estanislado Pandy

## 2017-10-04 ENCOUNTER — Ambulatory Visit (INDEPENDENT_AMBULATORY_CARE_PROVIDER_SITE_OTHER): Payer: Medicare Other | Admitting: Rheumatology

## 2017-10-04 ENCOUNTER — Encounter: Payer: Self-pay | Admitting: Rheumatology

## 2017-10-04 VITALS — BP 132/83 | HR 71 | Resp 15 | Ht 70.0 in | Wt 218.0 lb

## 2017-10-04 DIAGNOSIS — M19041 Primary osteoarthritis, right hand: Secondary | ICD-10-CM | POA: Diagnosis not present

## 2017-10-04 DIAGNOSIS — Z8639 Personal history of other endocrine, nutritional and metabolic disease: Secondary | ICD-10-CM | POA: Diagnosis not present

## 2017-10-04 DIAGNOSIS — M1380 Other specified arthritis, unspecified site: Secondary | ICD-10-CM

## 2017-10-04 DIAGNOSIS — M19042 Primary osteoarthritis, left hand: Secondary | ICD-10-CM

## 2017-10-04 DIAGNOSIS — Z8673 Personal history of transient ischemic attack (TIA), and cerebral infarction without residual deficits: Secondary | ICD-10-CM | POA: Diagnosis not present

## 2017-10-04 DIAGNOSIS — Z9889 Other specified postprocedural states: Secondary | ICD-10-CM | POA: Diagnosis not present

## 2017-10-04 DIAGNOSIS — G8929 Other chronic pain: Secondary | ICD-10-CM | POA: Diagnosis not present

## 2017-10-04 DIAGNOSIS — D472 Monoclonal gammopathy: Secondary | ICD-10-CM | POA: Diagnosis not present

## 2017-10-04 DIAGNOSIS — M170 Bilateral primary osteoarthritis of knee: Secondary | ICD-10-CM | POA: Diagnosis not present

## 2017-10-04 DIAGNOSIS — M1990 Unspecified osteoarthritis, unspecified site: Secondary | ICD-10-CM | POA: Diagnosis not present

## 2017-10-04 DIAGNOSIS — Z79899 Other long term (current) drug therapy: Secondary | ICD-10-CM | POA: Diagnosis not present

## 2017-10-04 DIAGNOSIS — Z8679 Personal history of other diseases of the circulatory system: Secondary | ICD-10-CM

## 2017-10-04 LAB — CBC WITH DIFFERENTIAL/PLATELET
BASOS ABS: 49 {cells}/uL (ref 0–200)
BASOS PCT: 0.6 %
EOS PCT: 1.4 %
Eosinophils Absolute: 113 cells/uL (ref 15–500)
HCT: 51.1 % — ABNORMAL HIGH (ref 38.5–50.0)
Hemoglobin: 17.2 g/dL — ABNORMAL HIGH (ref 13.2–17.1)
Lymphs Abs: 3151 cells/uL (ref 850–3900)
MCH: 31.2 pg (ref 27.0–33.0)
MCHC: 33.7 g/dL (ref 32.0–36.0)
MCV: 92.7 fL (ref 80.0–100.0)
MONOS PCT: 13 %
MPV: 9.3 fL (ref 7.5–12.5)
NEUTROS ABS: 3734 {cells}/uL (ref 1500–7800)
Neutrophils Relative %: 46.1 %
PLATELETS: 200 10*3/uL (ref 140–400)
RBC: 5.51 10*6/uL (ref 4.20–5.80)
RDW: 12.8 % (ref 11.0–15.0)
Total Lymphocyte: 38.9 %
WBC mixed population: 1053 cells/uL — ABNORMAL HIGH (ref 200–950)
WBC: 8.1 10*3/uL (ref 3.8–10.8)

## 2017-10-04 LAB — COMPLETE METABOLIC PANEL WITH GFR
AG Ratio: 2 (calc) (ref 1.0–2.5)
ALKALINE PHOSPHATASE (APISO): 76 U/L (ref 40–115)
ALT: 18 U/L (ref 9–46)
AST: 19 U/L (ref 10–35)
Albumin: 4.5 g/dL (ref 3.6–5.1)
BILIRUBIN TOTAL: 0.5 mg/dL (ref 0.2–1.2)
BUN: 14 mg/dL (ref 7–25)
CHLORIDE: 104 mmol/L (ref 98–110)
CO2: 28 mmol/L (ref 20–32)
Calcium: 9.5 mg/dL (ref 8.6–10.3)
Creat: 0.82 mg/dL (ref 0.70–1.25)
GFR, Est African American: 105 mL/min/{1.73_m2} (ref >=60)
GFR, Est Non African American: 91 mL/min/{1.73_m2} (ref >=60)
Globulin: 2.2 g/dL (calc) (ref 1.9–3.7)
Glucose, Bld: 75 mg/dL (ref 65–99)
Potassium: 4.3 mmol/L (ref 3.5–5.3)
Sodium: 142 mmol/L (ref 135–146)
Total Protein: 6.7 g/dL (ref 6.1–8.1)

## 2017-10-04 MED ORDER — TRAMADOL HCL 50 MG PO TABS: 50.0000 mg | ORAL_TABLET | Freq: Two times a day (BID) | ORAL | 2 refills | Status: DC

## 2017-10-04 NOTE — Patient Instructions (Signed)
Standing Labs We placed an order today for your standing lab work.   Please come back and get your standing labs in October and every 3 months   We have open lab Monday through Friday from 8:30-11:30 AM and 1:30-4:00 PM  at the office of Dr. Jordan Caraveo.   You may experience shorter wait times on Monday and Friday afternoons. The office is located at [address], [address], [place], [place] [address] No appointment is necessary.   Labs are drawn by [facility].  You may receive a bill from [facility] for your lab work. If you have any questions regarding directions or hours of operation,  please call [phone].    

## 2017-10-06 LAB — PAIN MGMT, PROFILE 5 W/CONF, U
Amphetamines: NEGATIVE ng/mL (ref <500)
BARBITURATES: NEGATIVE ng/mL (ref <300)
BENZODIAZEPINES: NEGATIVE ng/mL (ref <100)
COCAINE METABOLITE: NEGATIVE ng/mL (ref <150)
CREATININE: 103.8 mg/dL
Marijuana Metabolite: NEGATIVE ng/mL (ref <20)
Methadone Metabolite: NEGATIVE ng/mL (ref <100)
OPIATES: NEGATIVE ng/mL (ref <100)
OXYCODONE: NEGATIVE ng/mL (ref <100)
Oxidant: NEGATIVE ug/mL (ref <200)
pH: 5.92 (ref 4.5–9.0)

## 2017-10-06 LAB — PAIN MGMT, TRAMADOL W/MEDMATCH, U
DESMETHYLTRAMADOL: NEGATIVE ng/mL (ref <100)
Tramadol: NEGATIVE ng/mL (ref <100)

## 2017-10-14 IMAGING — MR MR HEAD W/O CM
9 of 10 series · 38 of 48 positions shown · non-contrast
Comparison: 01/19/2005 MRI of the head.

CLINICAL DATA: 65 y/o  M; transient aphasia.

EXAM:
MRI HEAD WITHOUT CONTRAST
TECHNIQUE: Multiplanar, multiecho pulse sequences of the brain and surrounding
structures were obtained without intravenous contrast.

[Series 3: DWI · axial · 3.0mm · 1.09mm/px · z∈[-39,+105]mm · 11 of 98 slices shown (1 of 4)]
[im 1/98]
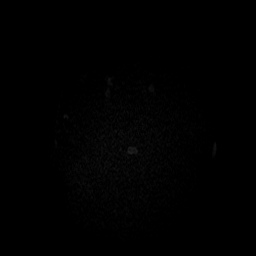
[im 10/98]
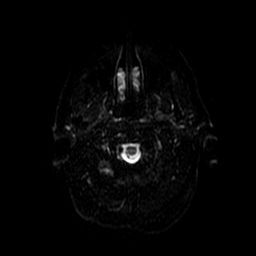
[im 20/98]
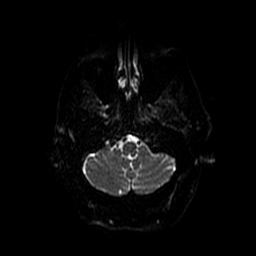
[im 30/98]
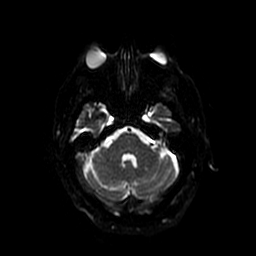
[im 39/98]
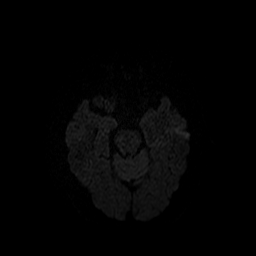
[im 49/98]
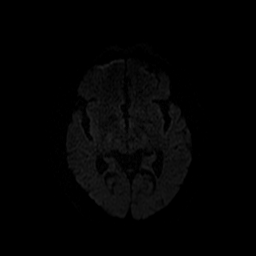
[im 59/98]
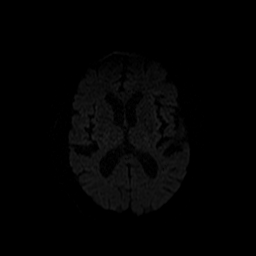
[im 68/98]
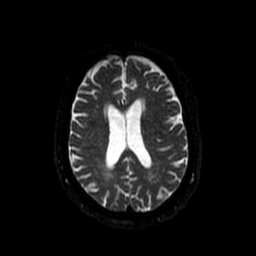
[im 78/98]
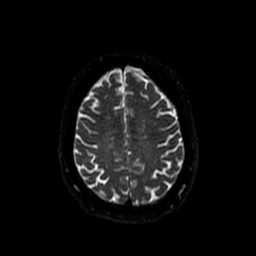
[im 88/98]
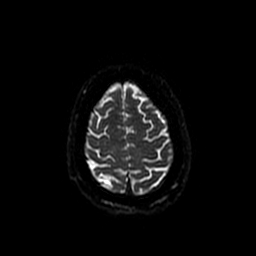
[im 98/98]
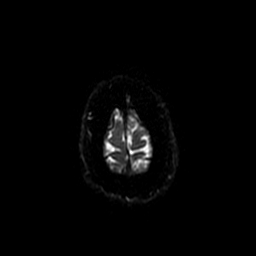

[Series 4: T2 · axial · 5.0mm · 0.47mm/px · z∈[-41,+102]mm · 2 of 25 slices shown (1 of 2)]
[im 1/25]
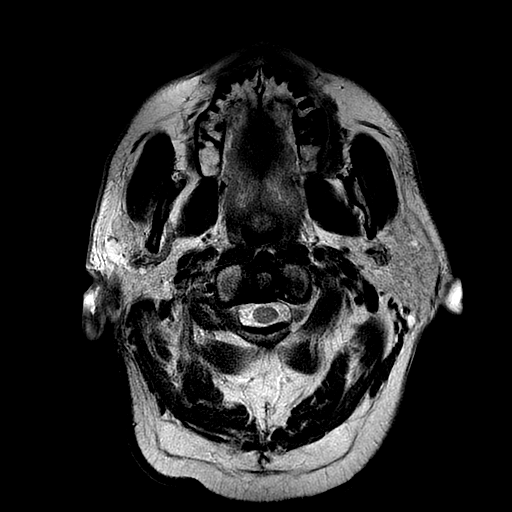
[im 25/25]
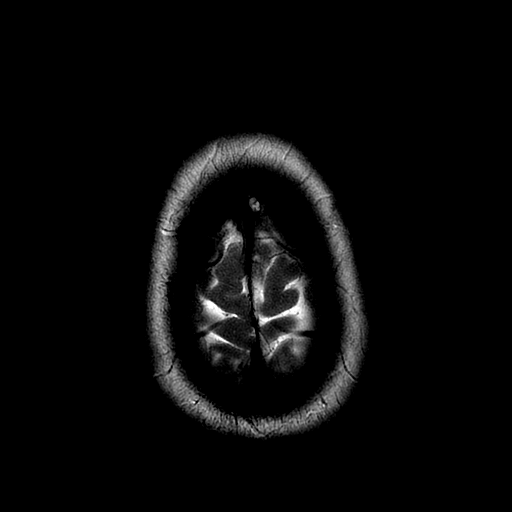

[Series 5: DWI · coronal · 5.0mm · 1.09mm/px · 7 of 72 slices shown (2 of 4)]
[im 1/72]
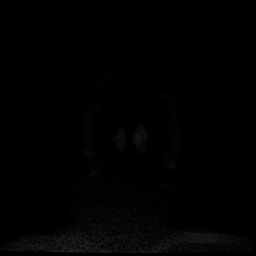
[im 12/72]
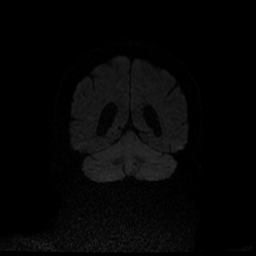
[im 24/72]
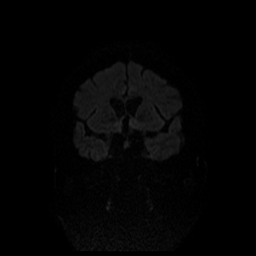
[im 36/72]
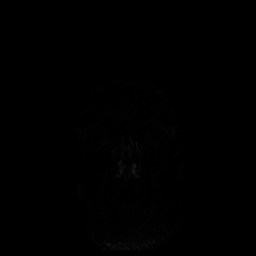
[im 48/72]
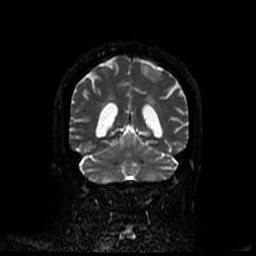
[im 60/72]
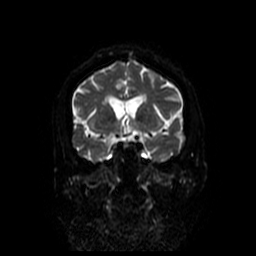
[im 72/72]
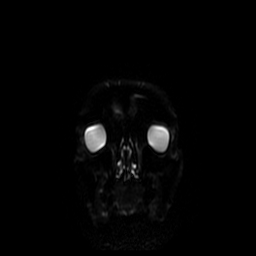

[Series 6: FLAIR · axial · 5.0mm · 0.47mm/px · z∈[-41,+102]mm · 2 of 25 slices shown]
[im 1/25]
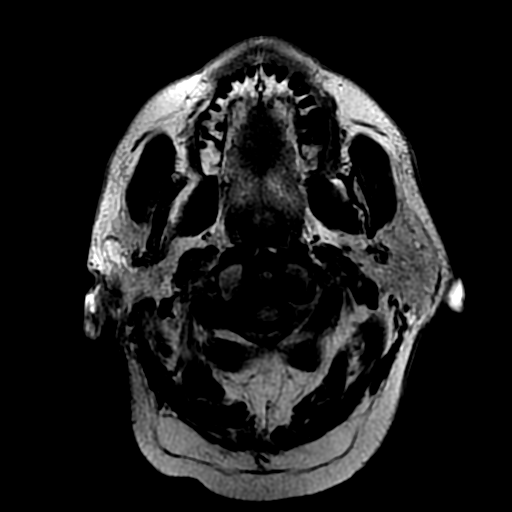
[im 25/25]
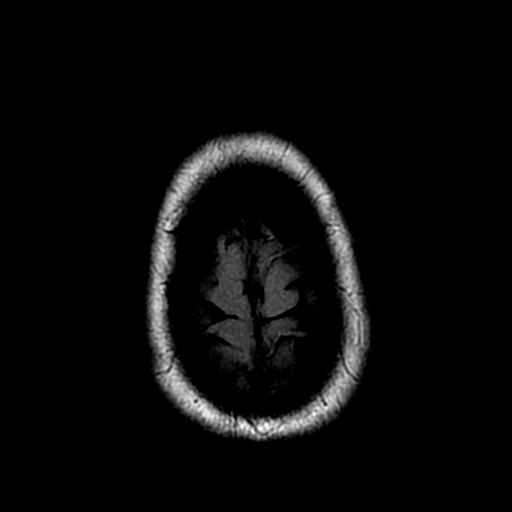

[Series 7: ax mpgr · axial · 5.0mm · 0.47mm/px · z∈[-41,+102]mm · 2 of 25 slices shown]
[im 1/25]
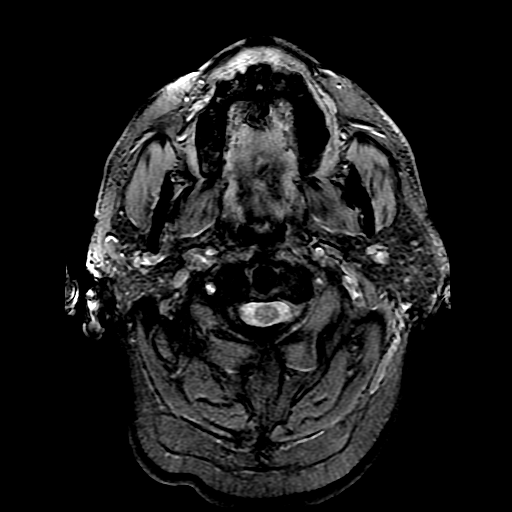
[im 25/25]
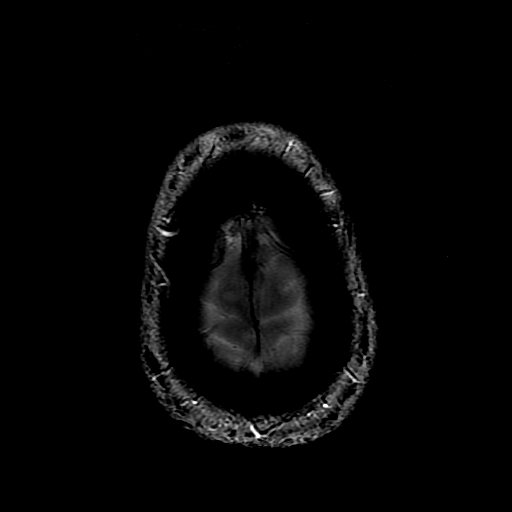

[Series 8: T1 · sagittal · 5.0mm · 0.47mm/px · 2 of 23 slices shown]
[im 1/23]
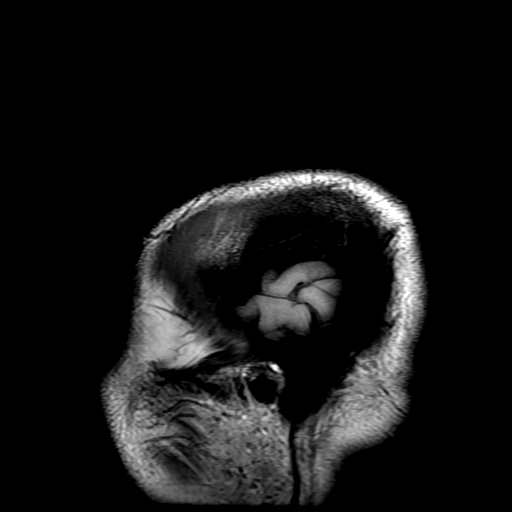
[im 23/23]
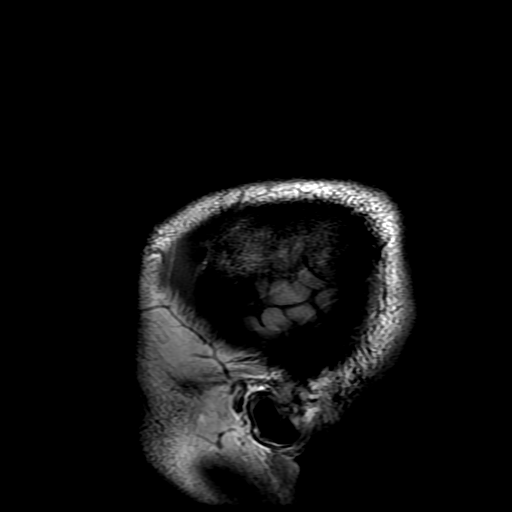

[Series 10: T2 · coronal · 5.0mm · 0.43mm/px · 3 of 30 slices shown (2 of 2)]
[im 1/30]
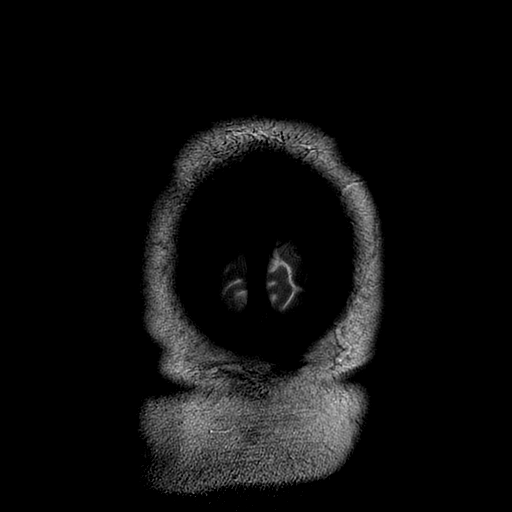
[im 15/30]
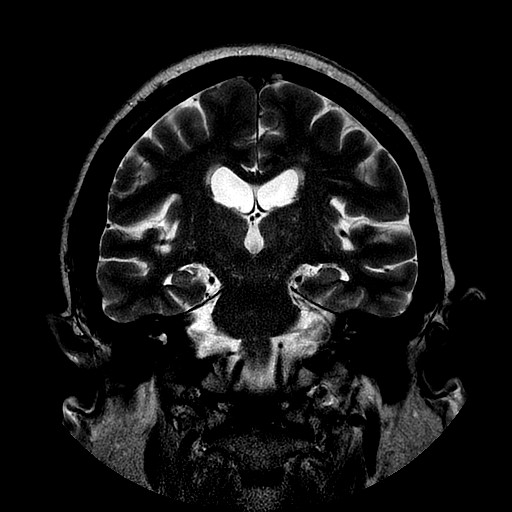
[im 30/30]
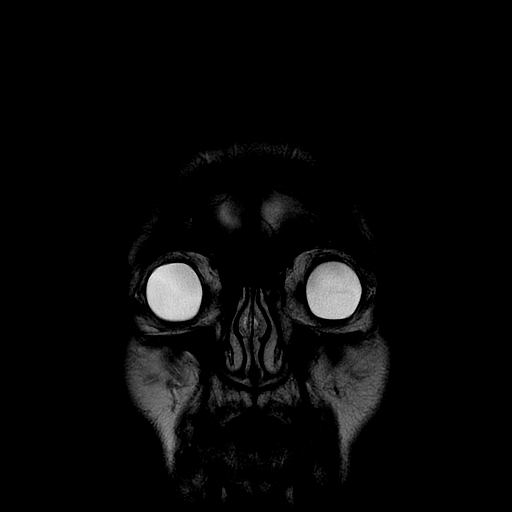

[Series 300: DWI · axial · 3.0mm · 1.09mm/px · z∈[-39,+105]mm · 5 of 49 slices shown (3 of 4)]
[im 1/49]
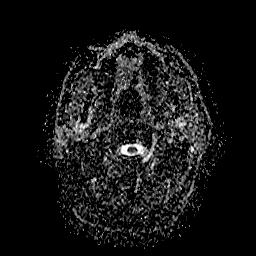
[im 13/49]
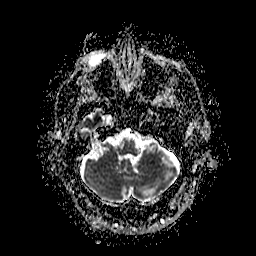
[im 25/49]
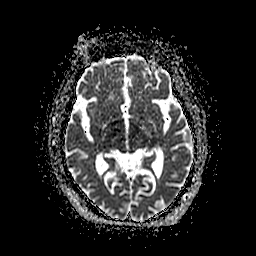
[im 37/49]
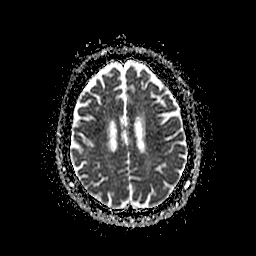
[im 49/49]
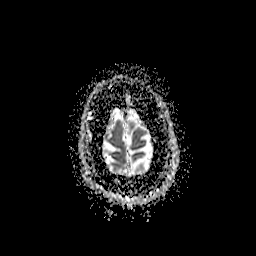

[Series 500: DWI · coronal · 5.0mm · 1.09mm/px · 4 of 36 slices shown (4 of 4)]
[im 1/36]
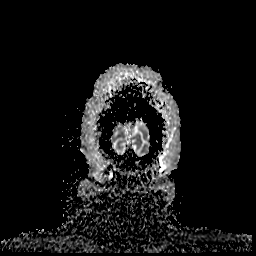
[im 12/36]
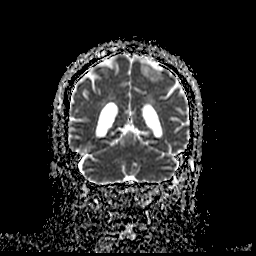
[im 24/36]
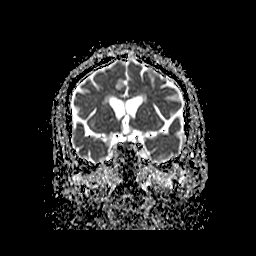
[im 36/36]
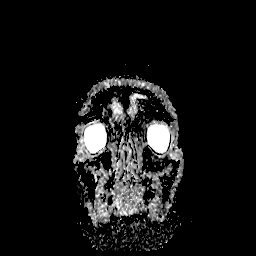

[38 of 48 positions shown; findings below may reference images not displayed]

FINDINGS: Brain: No acute infarction, hemorrhage, hydrocephalus, extra-axial
collection or mass lesion. Foci of T2 FLAIR hyperintense signal
abnormality in subcortical and periventricular white matter are
nonspecific but compatible with mild chronic microvascular ischemic
changes. There is mild brain parenchymal volume loss diffusely. A
small focus of T2 hyperintensity within left brachium pontis
probably represents a small chronic lacunar infarction. There is a
focus of susceptibility hypointensity within the left parietal lobe
(series 7, image 22) with low T1 and T2 signal which may represent
hemosiderin deposition from old microhemorrhage or a tiny cavernoma.

Vascular: Normal flow voids.

Skull and upper cervical spine: Normal marrow signal.

Sinuses/Orbits: No abnormal signal of paranasal sinuses or mastoid
air cells. Bilateral intra-ocular lens replacement.

Other: None.
IMPRESSION: 1. No acute intracranial abnormality identified.
2. Mild chronic microvascular ischemic changes and mild parenchymal
volume loss of the brain.
3. Left parietal lobe punctate susceptibility focus may represent
hemosiderin deposition from old microhemorrhage or a tiny cavernoma.

By: Klever Jumper M.D.

## 2017-10-14 IMAGING — CT CT HEAD W/O CM
3 of 4 series · 17 of 47 positions shown, 20 images · non-contrast
Comparison: 10/31/2013

CLINICAL DATA: Slight speech problems and slight headache today.

EXAM:
CT HEAD WITHOUT CONTRAST
TECHNIQUE: Contiguous axial images were obtained from the base of the skull
through the vertex without intravenous contrast.

[Series 201: head w/o, idose (1) · axial · non-contrast · 0.49mm/px · z∈[-5,+130]mm · 11 of 33 slices shown, 14 images]
[im 3/33  brain]
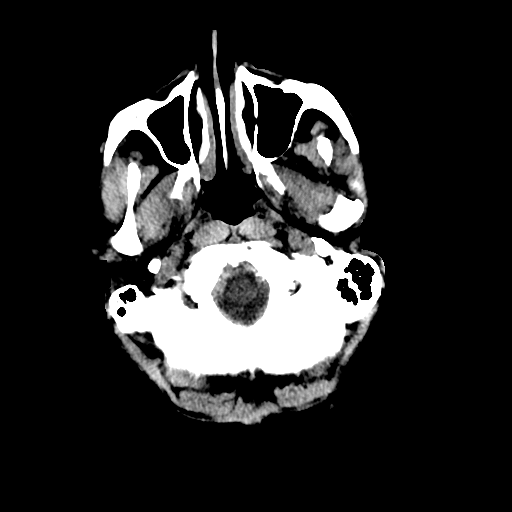
[im 3/33  bone]
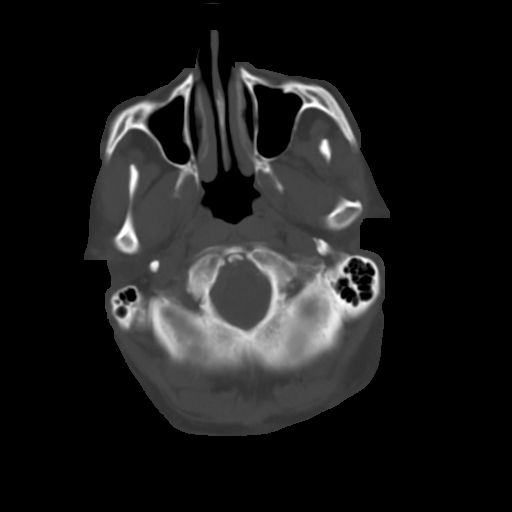
[im 5/33  brain]
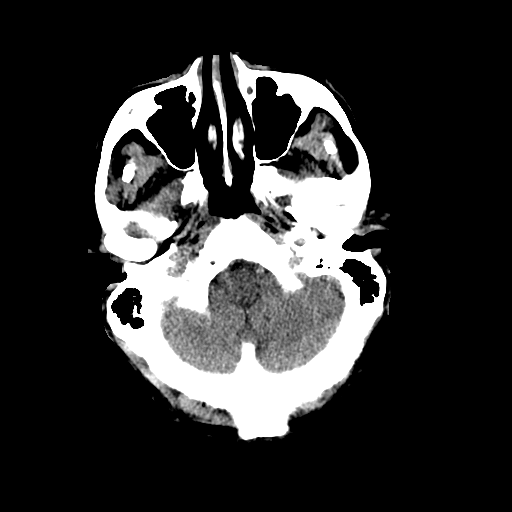
[im 7/33  brain]
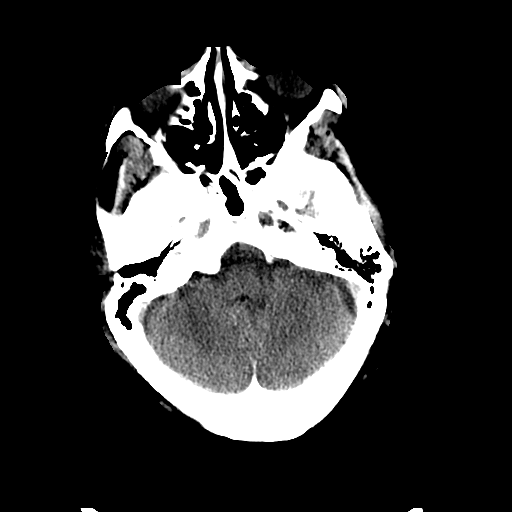
[im 12/33  brain]
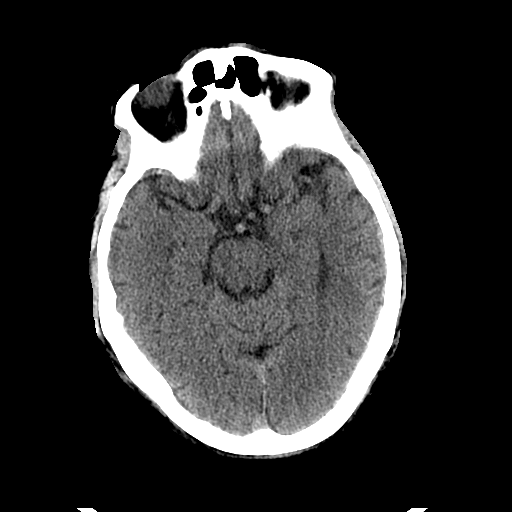
[im 14/33  brain]
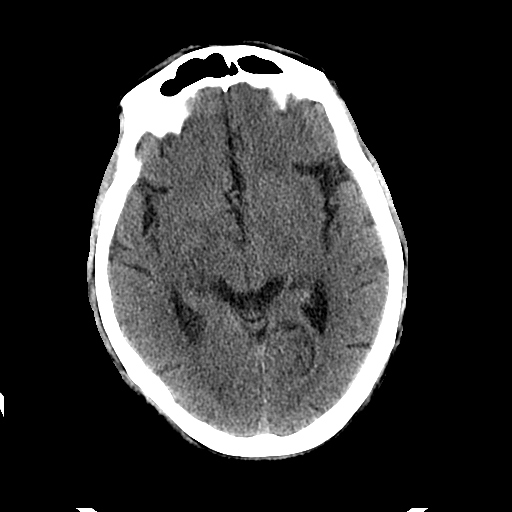
[im 14/33  bone]
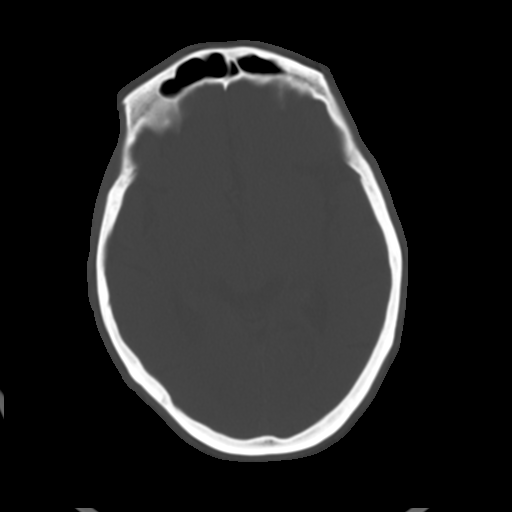
[im 17/33  brain]
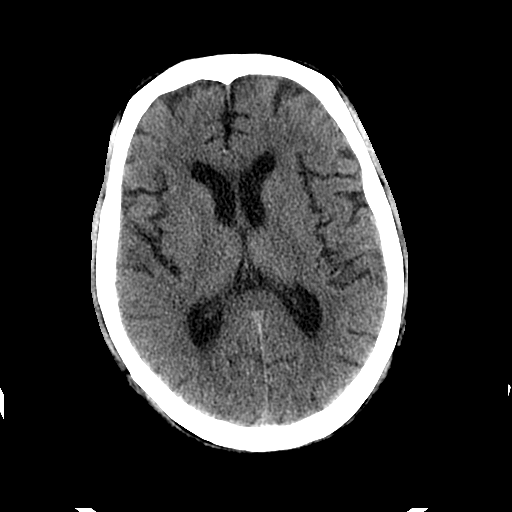
[im 19/33  brain]
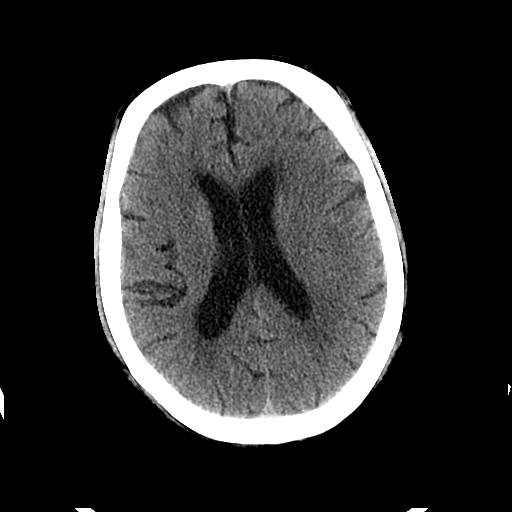
[im 21/33  brain]
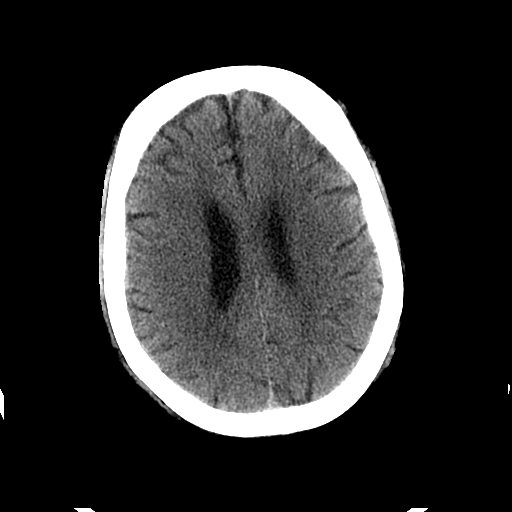
[im 26/33  brain]
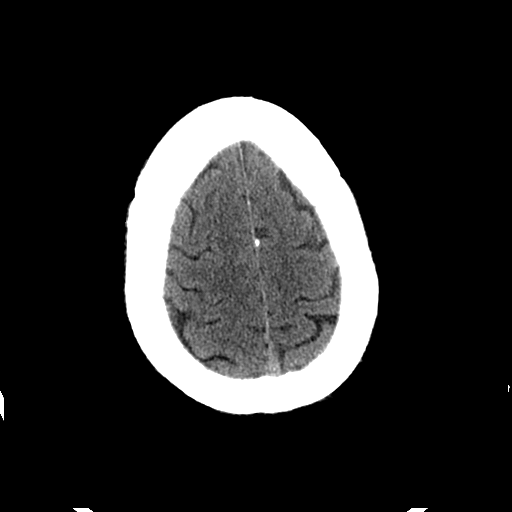
[im 26/33  bone]
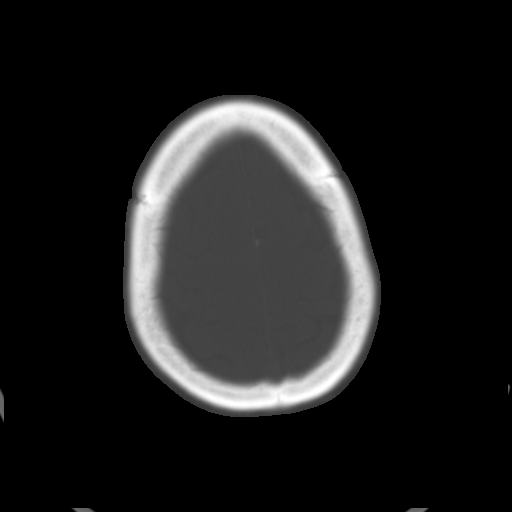
[im 28/33  brain]
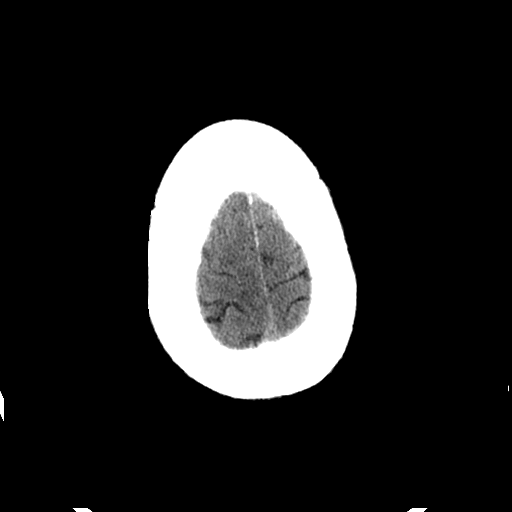
[im 30/33  brain]
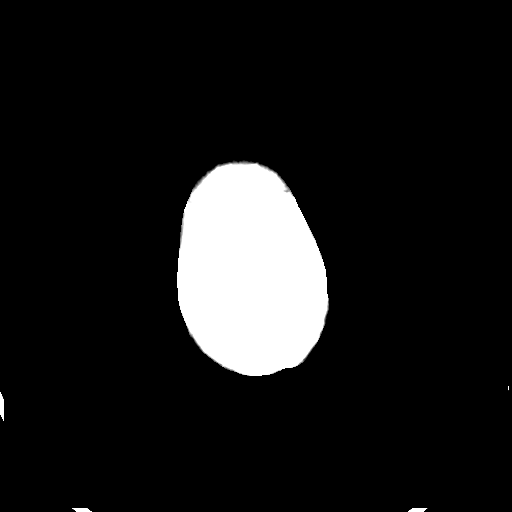

[Series 203: coronal st, idose (1) · coronal · 0.40mm/px · 3 of 73 slices shown]
[im 25/73  brain]
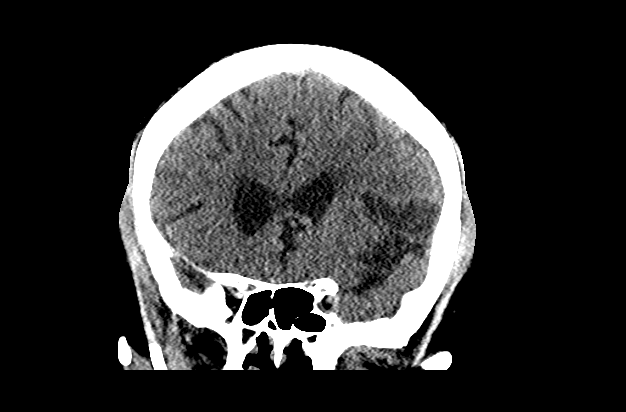
[im 33/73  brain]
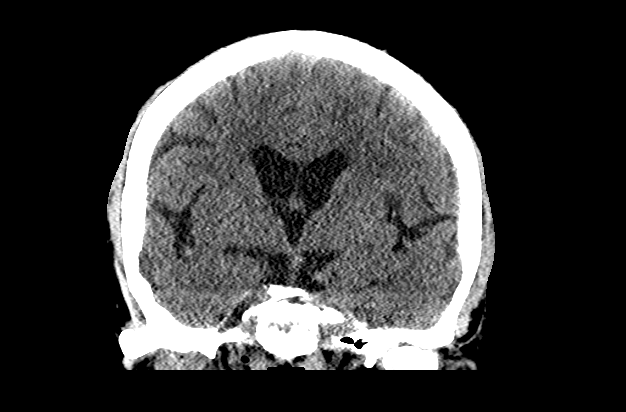
[im 41/73  brain]
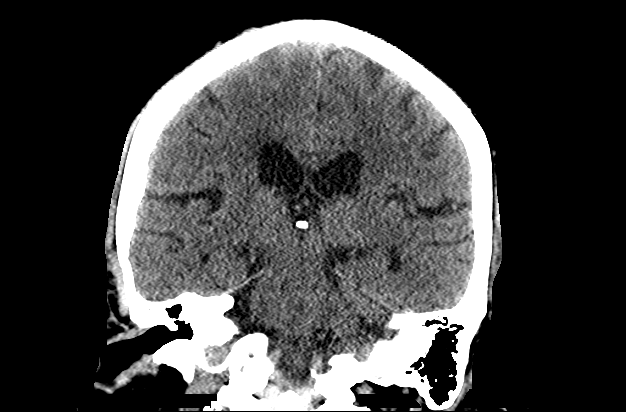

[Series 204: sagittal st, idose (1) · sagittal · 0.40mm/px · 3 of 64 slices shown]
[im 22/64  brain]
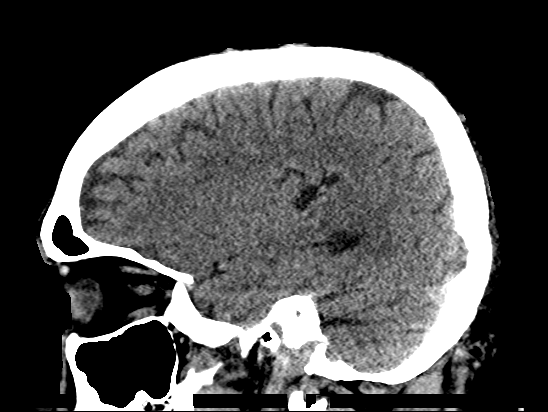
[im 32/64  brain]
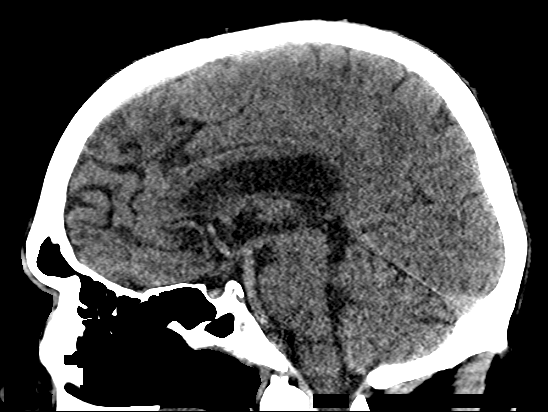
[im 43/64  brain]
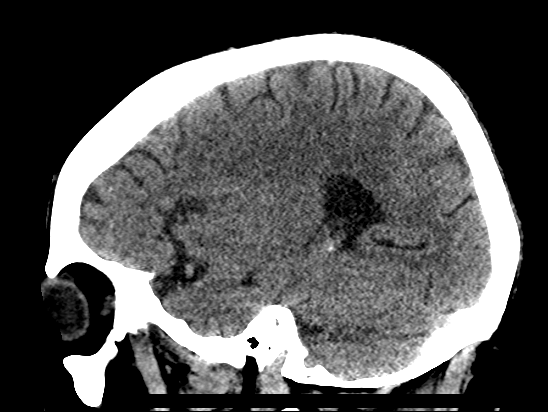

[17 of 47 positions shown; findings below may reference images not displayed]

FINDINGS: Brain: Ventricles, cisterns and other CSF spaces are within normal.
There is mild chronic ischemic microvascular disease. There is no
mass, mass effect, shift of midline structures or acute hemorrhage.
Possible small old lacune infarct over the right lentiform nucleus.
No evidence of acute infarction.

Vascular: Calcified plaque over the cavernous segment of the
internal carotid arteries and vertebral arteries bilaterally.

Skull: Within normal.

Sinuses/Orbits: Within normal.

Other: None.
IMPRESSION: No acute intracranial findings.

Mild chronic ischemic microvascular disease.

## 2017-11-02 DIAGNOSIS — E113212 Type 2 diabetes mellitus with mild nonproliferative diabetic retinopathy with macular edema, left eye: Secondary | ICD-10-CM | POA: Diagnosis not present

## 2017-11-02 DIAGNOSIS — E113291 Type 2 diabetes mellitus with mild nonproliferative diabetic retinopathy without macular edema, right eye: Secondary | ICD-10-CM | POA: Diagnosis not present

## 2017-11-30 DIAGNOSIS — E11319 Type 2 diabetes mellitus with unspecified diabetic retinopathy without macular edema: Secondary | ICD-10-CM | POA: Diagnosis not present

## 2017-11-30 DIAGNOSIS — E7800 Pure hypercholesterolemia, unspecified: Secondary | ICD-10-CM | POA: Diagnosis not present

## 2017-11-30 DIAGNOSIS — R5383 Other fatigue: Secondary | ICD-10-CM | POA: Diagnosis not present

## 2017-11-30 DIAGNOSIS — Z23 Encounter for immunization: Secondary | ICD-10-CM | POA: Diagnosis not present

## 2017-11-30 DIAGNOSIS — G609 Hereditary and idiopathic neuropathy, unspecified: Secondary | ICD-10-CM | POA: Diagnosis not present

## 2017-11-30 DIAGNOSIS — I10 Essential (primary) hypertension: Secondary | ICD-10-CM | POA: Diagnosis not present

## 2017-11-30 DIAGNOSIS — E1165 Type 2 diabetes mellitus with hyperglycemia: Secondary | ICD-10-CM | POA: Diagnosis not present

## 2017-12-05 ENCOUNTER — Other Ambulatory Visit: Payer: Self-pay | Admitting: Rheumatology

## 2017-12-05 NOTE — Telephone Encounter (Signed)
Last Visit: 10/04/17 Next Visit: 04/06/18 Labs: 10/04/17 Hgb and Hct borderline elevated. All other labs are WNL.  Okay to refill per Dr. Estanislado Pandy

## 2018-01-02 ENCOUNTER — Other Ambulatory Visit: Payer: Self-pay

## 2018-01-02 DIAGNOSIS — Z79899 Other long term (current) drug therapy: Secondary | ICD-10-CM

## 2018-01-03 LAB — COMPLETE METABOLIC PANEL WITH GFR
AG Ratio: 1.8 (calc) (ref 1.0–2.5)
ALBUMIN MSPROF: 4.2 g/dL (ref 3.6–5.1)
ALKALINE PHOSPHATASE (APISO): 67 U/L (ref 40–115)
ALT: 16 U/L (ref 9–46)
AST: 23 U/L (ref 10–35)
BUN: 19 mg/dL (ref 7–25)
CO2: 31 mmol/L (ref 20–32)
Calcium: 9.6 mg/dL (ref 8.6–10.3)
Chloride: 106 mmol/L (ref 98–110)
Creat: 0.76 mg/dL (ref 0.70–1.25)
GFR, EST AFRICAN AMERICAN: 108 mL/min/{1.73_m2} (ref >=60)
GFR, Est Non African American: 93 mL/min/{1.73_m2} (ref >=60)
GLOBULIN: 2.4 g/dL (ref 1.9–3.7)
Glucose, Bld: 95 mg/dL (ref 65–99)
Potassium: 4.7 mmol/L (ref 3.5–5.3)
SODIUM: 142 mmol/L (ref 135–146)
TOTAL PROTEIN: 6.6 g/dL (ref 6.1–8.1)
Total Bilirubin: 0.4 mg/dL (ref 0.2–1.2)

## 2018-01-03 LAB — CBC WITH DIFFERENTIAL/PLATELET
BASOS PCT: 0.4 %
Basophils Absolute: 31 cells/uL (ref 0–200)
EOS PCT: 1.3 %
Eosinophils Absolute: 100 cells/uL (ref 15–500)
HCT: 48.1 % (ref 38.5–50.0)
Hemoglobin: 16.4 g/dL (ref 13.2–17.1)
Lymphs Abs: 2641 cells/uL (ref 850–3900)
MCH: 31.9 pg (ref 27.0–33.0)
MCHC: 34.1 g/dL (ref 32.0–36.0)
MCV: 93.6 fL (ref 80.0–100.0)
MONOS PCT: 11.8 %
MPV: 9.7 fL (ref 7.5–12.5)
NEUTROS ABS: 4019 {cells}/uL (ref 1500–7800)
Neutrophils Relative %: 52.2 %
Platelets: 196 10*3/uL (ref 140–400)
RBC: 5.14 10*6/uL (ref 4.20–5.80)
RDW: 12.5 % (ref 11.0–15.0)
Total Lymphocyte: 34.3 %
WBC mixed population: 909 cells/uL (ref 200–950)
WBC: 7.7 10*3/uL (ref 3.8–10.8)

## 2018-01-23 DIAGNOSIS — H18413 Arcus senilis, bilateral: Secondary | ICD-10-CM | POA: Diagnosis not present

## 2018-01-23 DIAGNOSIS — I10 Essential (primary) hypertension: Secondary | ICD-10-CM | POA: Diagnosis not present

## 2018-01-23 DIAGNOSIS — H02834 Dermatochalasis of left upper eyelid: Secondary | ICD-10-CM | POA: Diagnosis not present

## 2018-01-23 DIAGNOSIS — H02831 Dermatochalasis of right upper eyelid: Secondary | ICD-10-CM | POA: Diagnosis not present

## 2018-01-23 DIAGNOSIS — H527 Unspecified disorder of refraction: Secondary | ICD-10-CM | POA: Diagnosis not present

## 2018-01-23 DIAGNOSIS — Z961 Presence of intraocular lens: Secondary | ICD-10-CM | POA: Diagnosis not present

## 2018-02-15 ENCOUNTER — Telehealth: Payer: Self-pay | Admitting: Rheumatology

## 2018-02-15 MED ORDER — METHOCARBAMOL 500 MG PO TABS: 500.0000 mg | ORAL_TABLET | Freq: Two times a day (BID) | ORAL | 2 refills | Status: DC | PRN

## 2018-02-15 NOTE — Telephone Encounter (Signed)
Patient left a voicemail requesting prescription refill of Methocarbamol 500 mg to be sent to [facility] in [place].

## 2018-02-15 NOTE — Telephone Encounter (Signed)
Last Visit: 10/04/17 Next Visit: 04/06/18  Okay to refill per Dr. Estanislado Pandy

## 2018-03-02 DIAGNOSIS — Z794 Long term (current) use of insulin: Secondary | ICD-10-CM | POA: Diagnosis not present

## 2018-03-02 DIAGNOSIS — I2510 Atherosclerotic heart disease of native coronary artery without angina pectoris: Secondary | ICD-10-CM | POA: Diagnosis not present

## 2018-03-02 DIAGNOSIS — R0609 Other forms of dyspnea: Secondary | ICD-10-CM | POA: Diagnosis not present

## 2018-03-02 DIAGNOSIS — E119 Type 2 diabetes mellitus without complications: Secondary | ICD-10-CM | POA: Diagnosis not present

## 2018-03-12 ENCOUNTER — Other Ambulatory Visit: Payer: Self-pay | Admitting: Rheumatology

## 2018-03-13 NOTE — Telephone Encounter (Signed)
Last Visit: 10/04/17 Next Visit: 04/06/18 Labs: 01/02/18 WNL  Okay to refill per Dr. Estanislado Pandy

## 2018-03-23 NOTE — Progress Notes (Signed)
Office Visit Note  Patient: Alan Wang             Date of Birth: 1950-07-05           MRN: [id number]             PCP: Aura Dials, MD Referring: Aura Dials, MD Visit Date: 04/06/2018 Occupation: @GUAROCC @  Subjective:  Pain in multiple joints.   History of Present Illness: Alan Wang is a 68 y.o. male with history of inflammatory arthritis, osteoarthritis.  He states he continues to have pain and discomfort in all his joints.  He denies any joint swelling.  He has been taking sulfasalazine which is tolerating well.  He has pain in his bilateral knee joints.  Especially his left knee joint has been painful.  He states he has problems with balance.  He has difficulty climbing stairs and walking.  Activities of Daily Living:  Patient reports morning stiffness for 15 minutes.   Patient Denies nocturnal pain.  Difficulty dressing/grooming: Denies Difficulty climbing stairs: Reports Difficulty getting out of chair: Reports Difficulty using hands for taps, buttons, cutlery, and/or writing: Denies  Review of Systems  Constitutional: Positive for fatigue. Negative for night sweats.  HENT: Positive for mouth dryness. Negative for mouth sores and nose dryness.   Eyes: Negative for redness and dryness.  Respiratory: Negative for shortness of breath and difficulty breathing.   Cardiovascular: Negative for chest pain, palpitations, hypertension, irregular heartbeat and swelling in legs/feet.  Gastrointestinal: Negative for constipation and diarrhea.  Endocrine: Negative for increased urination.  Musculoskeletal: Positive for arthralgias, joint pain and morning stiffness. Negative for joint swelling, myalgias, muscle weakness, muscle tenderness and myalgias.  Skin: Negative for color change, rash, hair loss, nodules/bumps, skin tightness, ulcers and sensitivity to sunlight.  Allergic/Immunologic: Negative for susceptible to infections.  Neurological: Negative for dizziness,  fainting, memory loss, night sweats and weakness ( ).  Hematological: Negative for swollen glands.  Psychiatric/Behavioral: Negative for depressed mood and sleep disturbance. The patient is not nervous/anxious.     PMFS History:  Patient Active Problem List   Diagnosis Date Noted  . TIA (transient ischemic attack) 06/22/2016  . Type 2 diabetes mellitus without complication ([place]) 95/28/4132  . Primary osteoarthritis of both hands 06/03/2016  . Primary osteoarthritis of both knees 06/03/2016  . Generalized pain 06/03/2016  . Memory difficulties 06/03/2016  . History of diabetes mellitus 06/03/2016  . History of coronary artery disease 06/03/2016  . History of hypertension 06/03/2016  . History of hypercholesterolemia 06/03/2016  . High risk medication use 06/03/2016  . History of high cholesterol 06/03/2016  . ANA positive 06/03/2016  . Inflammatory arthritis 10/29/2015  . Vitamin D deficiency 10/29/2015  . Fatigue 09/24/2015  . MGUS (monoclonal gammopathy of unknown significance) 09/22/2015  . Vertigo 10/31/2013  . Chronic cough 11/16/2012  . CAD (coronary artery disease), native coronary artery 03/08/2011  . Postsurgical percutaneous transluminal coronary angioplasty (PTCA) status 03/08/2011  . Type II or unspecified type diabetes mellitus with other coma, not stated as uncontrolled 03/08/2011  . Hypertension associated with diabetes ([place]) 03/08/2011  . Shortness of breath 03/08/2011  . Mixed hyperlipidemia 03/08/2011    Past Medical History:  Diagnosis Date  . Arthritis   . Asthma   . Coronary artery disease   . Diabetes mellitus    type 2, insulin dependent for 30+ years  . History of kidney stones 11/2012  . Hyperlipemia   . Hypertension   . IDDM (insulin dependent diabetes  mellitus) ([place])   . Shortness of breath   . Stroke ([place])    hx TIA    Family History  Problem Relation Age of Onset  . Emphysema Mother   . COPD Mother   . Alzheimer's disease Father   .  Asthma Paternal Aunt   . Heart attack Paternal Grandfather    Past Surgical History:  Procedure Laterality Date  . ANGIOPLASTY  04/06/2011   x3   . CARDIAC CATHETERIZATION    . CARDIAC CATHETERIZATION N/A 09/16/2015   Procedure: Left Heart Cath and Coronary Angiography;  Surgeon: Adrian Prows, MD;  Location: [facility];  Service: Cardiovascular;  Laterality: N/A;  . cataracts    . CHOLECYSTECTOMY    . HERNIA REPAIR     x2  . LEFT HEART CATHETERIZATION WITH CORONARY ANGIOGRAM N/A 03/08/2011   Procedure: LEFT HEART CATHETERIZATION WITH CORONARY ANGIOGRAM;  Surgeon: Laverda Page, MD;  Location: [facility] C[facility];  Service: Cardiovascular;  Laterality: N/A;  . PERCUTANEOUS CORONARY STENT INTERVENTION (PCI-S) N/A 04/06/2011   Procedure: PERCUTANEOUS CORONARY STENT INTERVENTION (PCI-S);  Surgeon: Laverda Page, MD;  Location: [facility] C[facility] LAB;  Service: Cardiovascular;  Laterality: N/A;   Social History   Social History Narrative   Patient lives at home with his spouse.   Caffeine Use: occasionally   Immunization History  Administered Date(s) Administered  . Pneumococcal Polysaccharide-23 12/14/2011     Objective: Vital Signs: BP 116/64 (BP Location: Left Arm, Patient Position: Sitting, Cuff Size: Small)   Pulse 86   Resp 14   Ht 5\' 10"  (1.778 m)   Wt 208 lb 9.6 oz (94.6 kg)   BMI 29.93 kg/m    Physical Exam Vitals signs and nursing note reviewed.  Constitutional:      Appearance: He is well-developed.  HENT:     Head: Normocephalic and atraumatic.  Eyes:     Conjunctiva/sclera: Conjunctivae normal.     Pupils: Pupils are equal, round, and reactive to light.  Neck:     Musculoskeletal: Normal range of motion and neck supple.  Cardiovascular:     Rate and Rhythm: Normal rate and regular rhythm.     Heart sounds: Normal heart sounds.  Pulmonary:     Effort: Pulmonary effort is normal.     Breath sounds: Normal breath sounds.  Abdominal:     General: Bowel sounds are  normal.     Palpations: Abdomen is soft.  Skin:    General: Skin is warm and dry.     Capillary Refill: Capillary refill takes less than 2 seconds.  Neurological:     Mental Status: He is alert and oriented to person, place, and time.  Psychiatric:        Behavior: Behavior normal.      Musculoskeletal Exam: C-spine thoracic and lumbar spine good range of motion.  Shoulder joints elbow joints with good range of motion.  He has some DIP and PIP thickening in his hands with no synovitis.  Hip joints knee joints ankles MTPs PIPs with good range of motion with no synovitis.  He has thickening of his left knee joint without any warmth swelling or effusion. CDAI Exam: CDAI Score: 1.8  Patient Global Assessment: 4 (mm); Provider Global Assessment: 4 (mm) Swollen: 0 ; Tender: 1  Joint Exam      Right  Left  Knee      Tender     Investigation: No additional findings.  Imaging: Xr Knee 3 View Left  Result Date: 04/06/2018 Mild to moderate lateral compartment narrowing was noted.  No chondrocalcinosis was noted.  Moderate patellofemoral narrowing was noted. Impression: These findings are consistent mild to moderate osteoarthritis and moderate chondromalacia patella.   Recent Labs: Lab Results  Component Value Date   WBC 7.7 01/02/2018   HGB 16.4 01/02/2018   PLT 196 01/02/2018   NA 142 01/02/2018   K 4.7 01/02/2018   CL 106 01/02/2018   CO2 31 01/02/2018   GLUCOSE 95 01/02/2018   BUN 19 01/02/2018   CREATININE 0.76 01/02/2018   BILITOT 0.4 01/02/2018   ALKPHOS 76 10/26/2016   AST 23 01/02/2018   ALT 16 01/02/2018   PROT 6.6 01/02/2018   ALBUMIN 4.2 10/26/2016   CALCIUM 9.6 01/02/2018   GFRAA 108 01/02/2018    Speciality Comments: No specialty comments available.  Procedures:  No procedures performed Allergies: Avandia [rosiglitazone maleate]   Assessment / Plan:     Visit Diagnoses: Inflammatory arthritis-patient has been doing clinically very well without any  inflammation.  No synovitis was noted on examination today.  High risk medication use -  Sulfasalazine 1000 mg twice daily.  Most recent CBC/CMP within normal limits on 01/02/2018.  She is due for labs and CBC/CMP ordered for today.  Will monitor every 3 months and standing orders are in place.  Patient previously had Pneumovax 23.  Recommend annual flu vaccine, Prevnar 13, and Shingrix vaccine as indicated. - Plan: Pain Mgmt, Profile 5 w/Conf, U, Pain Mgmt, Tramadol w/medMATCH, U, CBC with Differential/Platelet, COMPLETE METABOLIC PANEL WITH GFR  Primary osteoarthritis of both hands-joint protection muscle strengthening was discussed.  Chronic pain of left knee -he has no synovitis but have discomfort with range of motion.  Plan: XR KNEE 3 VIEW LEFT.  The x-ray showed mild to moderate osteoarthritis and moderate chondromalacia patella.  I will refer him to physical therapy for bilateral lower extremity muscle strengthening.  Primary osteoarthritis of both knees  Other chronic pain -takes tramadol on PRN basis.  Tramadol. UDS: 7/30/2019Narc agreement: 10/04/2017  History of vitamin D deficiency-he is on vitamin D supplement.  MGUS (monoclonal gammopathy of unknown significance)  History of hypercholesterolemia  History of diabetes mellitus  History of TIA (transient ischemic attack)  History of arthroscopy of left knee  History of hypertension  History of coronary artery disease   Orders: Orders Placed This Encounter  Procedures  . XR KNEE 3 VIEW LEFT  . Pain Mgmt, Profile 5 w/Conf, U  . Pain Mgmt, Tramadol w/medMATCH, U  . CBC with Differential/Platelet  . COMPLETE METABOLIC PANEL WITH GFR   Meds ordered this encounter  Medications  . traMADol (ULTRAM) 50 MG tablet    Sig: Take 1 tablet (50 mg total) by mouth at bedtime as needed.    Dispense:  30 tablet    Refill:  2      Follow-Up Instructions: Return in about 6 months (around 10/05/2018) for Inflammatory arthritis,  oA.   Bo Merino, MD  Note - This record has been created using Editor, commissioning.  Chart creation errors have been sought, but may not always  have been located. Such creation errors do not reflect on  the standard of medical care.

## 2018-04-06 ENCOUNTER — Ambulatory Visit (INDEPENDENT_AMBULATORY_CARE_PROVIDER_SITE_OTHER): Payer: Medicare Other | Admitting: Rheumatology

## 2018-04-06 ENCOUNTER — Encounter: Payer: Self-pay | Admitting: Rheumatology

## 2018-04-06 ENCOUNTER — Ambulatory Visit (INDEPENDENT_AMBULATORY_CARE_PROVIDER_SITE_OTHER): Payer: Medicare Other

## 2018-04-06 VITALS — BP 116/64 | HR 86 | Resp 14 | Ht 70.0 in | Wt 208.6 lb

## 2018-04-06 DIAGNOSIS — M25562 Pain in left knee: Secondary | ICD-10-CM

## 2018-04-06 DIAGNOSIS — M170 Bilateral primary osteoarthritis of knee: Secondary | ICD-10-CM

## 2018-04-06 DIAGNOSIS — M19041 Primary osteoarthritis, right hand: Secondary | ICD-10-CM | POA: Diagnosis not present

## 2018-04-06 DIAGNOSIS — M19042 Primary osteoarthritis, left hand: Secondary | ICD-10-CM

## 2018-04-06 DIAGNOSIS — Z8673 Personal history of transient ischemic attack (TIA), and cerebral infarction without residual deficits: Secondary | ICD-10-CM | POA: Diagnosis not present

## 2018-04-06 DIAGNOSIS — Z9889 Other specified postprocedural states: Secondary | ICD-10-CM | POA: Diagnosis not present

## 2018-04-06 DIAGNOSIS — Z79899 Other long term (current) drug therapy: Secondary | ICD-10-CM | POA: Diagnosis not present

## 2018-04-06 DIAGNOSIS — D472 Monoclonal gammopathy: Secondary | ICD-10-CM

## 2018-04-06 DIAGNOSIS — Z8679 Personal history of other diseases of the circulatory system: Secondary | ICD-10-CM

## 2018-04-06 DIAGNOSIS — M1990 Unspecified osteoarthritis, unspecified site: Secondary | ICD-10-CM | POA: Diagnosis not present

## 2018-04-06 DIAGNOSIS — G8929 Other chronic pain: Secondary | ICD-10-CM | POA: Diagnosis not present

## 2018-04-06 DIAGNOSIS — Z8639 Personal history of other endocrine, nutritional and metabolic disease: Secondary | ICD-10-CM | POA: Diagnosis not present

## 2018-04-06 MED ORDER — TRAMADOL HCL 50 MG PO TABS: 50.0000 mg | ORAL_TABLET | Freq: Every evening | ORAL | 0 refills | Status: DC | PRN

## 2018-04-06 MED ORDER — TRAMADOL HCL 50 MG PO TABS: 50.0000 mg | ORAL_TABLET | Freq: Every evening | ORAL | 2 refills | Status: DC | PRN

## 2018-04-06 NOTE — Patient Instructions (Signed)
Standing Labs We placed an order today for your standing lab work.    Please come back and get your standing labs in April and every 3 months  We have open lab Monday through Friday from 8:30-11:30 AM and 1:30-4:00 PM  at the office of Dr. Cathye Kreiter.   You may experience shorter wait times on Monday and Friday afternoons. The office is located at [address], [address], [place], [place] [address] No appointment is necessary.   Labs are drawn by [facility].  You may receive a bill from [facility] for your lab work.  If you wish to have your labs drawn at another location, please call the office 24 hours in advance to send orders.  If you have any questions regarding directions or hours of operation,  please call [phone].   Just as a reminder please drink plenty of water prior to coming for your lab work. Thanks!  

## 2018-04-06 NOTE — Addendum Note (Signed)
Addended by: Carole Binning on: 04/06/2018 02:42 PM   Modules accepted: Orders

## 2018-04-08 LAB — CBC WITH DIFFERENTIAL/PLATELET
ABSOLUTE MONOCYTES: 1224 {cells}/uL — AB (ref 200–950)
BASOS PCT: 0.7 %
Basophils Absolute: 64 cells/uL (ref 0–200)
EOS ABS: 138 {cells}/uL (ref 15–500)
Eosinophils Relative: 1.5 %
HEMATOCRIT: 50.1 % — AB (ref 38.5–50.0)
HEMOGLOBIN: 17.1 g/dL (ref 13.2–17.1)
LYMPHS ABS: 2355 {cells}/uL (ref 850–3900)
MCH: 31.7 pg (ref 27.0–33.0)
MCHC: 34.1 g/dL (ref 32.0–36.0)
MCV: 92.8 fL (ref 80.0–100.0)
MPV: 9.4 fL (ref 7.5–12.5)
Monocytes Relative: 13.3 %
NEUTROS ABS: 5419 {cells}/uL (ref 1500–7800)
Neutrophils Relative %: 58.9 %
Platelets: 207 10*3/uL (ref 140–400)
RBC: 5.4 10*6/uL (ref 4.20–5.80)
RDW: 12.7 % (ref 11.0–15.0)
Total Lymphocyte: 25.6 %
WBC: 9.2 10*3/uL (ref 3.8–10.8)

## 2018-04-08 LAB — COMPLETE METABOLIC PANEL WITH GFR
AG Ratio: 1.8 (calc) (ref 1.0–2.5)
ALBUMIN MSPROF: 4.4 g/dL (ref 3.6–5.1)
ALT: 19 U/L (ref 9–46)
AST: 19 U/L (ref 10–35)
Alkaline phosphatase (APISO): 70 U/L (ref 40–115)
BUN: 20 mg/dL (ref 7–25)
CO2: 29 mmol/L (ref 20–32)
Calcium: 9.9 mg/dL (ref 8.6–10.3)
Chloride: 105 mmol/L (ref 98–110)
Creat: 0.82 mg/dL (ref 0.70–1.25)
GFR, Est African American: 105 mL/min/{1.73_m2} (ref >=60)
GFR, Est Non African American: 90 mL/min/{1.73_m2} (ref >=60)
Globulin: 2.4 g/dL (calc) (ref 1.9–3.7)
Glucose, Bld: 83 mg/dL (ref 65–99)
Potassium: 4.2 mmol/L (ref 3.5–5.3)
Sodium: 143 mmol/L (ref 135–146)
Total Bilirubin: 0.6 mg/dL (ref 0.2–1.2)
Total Protein: 6.8 g/dL (ref 6.1–8.1)

## 2018-04-08 LAB — PAIN MGMT, PROFILE 5 W/CONF, U
Amphetamines: NEGATIVE ng/mL (ref <500)
BARBITURATES: NEGATIVE ng/mL (ref <300)
BENZODIAZEPINES: NEGATIVE ng/mL (ref <100)
Cocaine Metabolite: NEGATIVE ng/mL (ref <150)
Creatinine: 79 mg/dL
METHADONE METABOLITE: NEGATIVE ng/mL (ref <100)
Marijuana Metabolite: NEGATIVE ng/mL (ref <20)
OPIATES: NEGATIVE ng/mL (ref <100)
Oxidant: NEGATIVE ug/mL (ref <200)
Oxycodone: NEGATIVE ng/mL (ref <100)
pH: 5.87 (ref 4.5–9.0)

## 2018-04-08 LAB — PAIN MGMT, TRAMADOL W/MEDMATCH, U
Desmethyltramadol: NEGATIVE ng/mL (ref <100)
TRAMADOL: NEGATIVE ng/mL (ref <100)

## 2018-04-08 NOTE — Progress Notes (Signed)
Pt takes tramadol prn only. Rest of the abs are WNLs

## 2018-04-21 DIAGNOSIS — Z0000 Encounter for general adult medical examination without abnormal findings: Secondary | ICD-10-CM | POA: Diagnosis not present

## 2018-04-21 DIAGNOSIS — Z7984 Long term (current) use of oral hypoglycemic drugs: Secondary | ICD-10-CM | POA: Diagnosis not present

## 2018-04-21 DIAGNOSIS — E119 Type 2 diabetes mellitus without complications: Secondary | ICD-10-CM | POA: Diagnosis not present

## 2018-04-21 DIAGNOSIS — M150 Primary generalized (osteo)arthritis: Secondary | ICD-10-CM | POA: Diagnosis not present

## 2018-04-21 DIAGNOSIS — Z8673 Personal history of transient ischemic attack (TIA), and cerebral infarction without residual deficits: Secondary | ICD-10-CM | POA: Diagnosis not present

## 2018-04-21 DIAGNOSIS — E785 Hyperlipidemia, unspecified: Secondary | ICD-10-CM | POA: Diagnosis not present

## 2018-04-21 DIAGNOSIS — I2510 Atherosclerotic heart disease of native coronary artery without angina pectoris: Secondary | ICD-10-CM | POA: Diagnosis not present

## 2018-04-21 DIAGNOSIS — N400 Enlarged prostate without lower urinary tract symptoms: Secondary | ICD-10-CM | POA: Diagnosis not present

## 2018-04-21 DIAGNOSIS — Z125 Encounter for screening for malignant neoplasm of prostate: Secondary | ICD-10-CM | POA: Diagnosis not present

## 2018-04-21 DIAGNOSIS — I10 Essential (primary) hypertension: Secondary | ICD-10-CM | POA: Diagnosis not present

## 2018-05-15 ENCOUNTER — Telehealth: Payer: Self-pay

## 2018-05-16 MED ORDER — OLMESARTAN MEDOXOMIL 20 MG PO TABS: 20.0000 mg | ORAL_TABLET | Freq: Every day | ORAL | 1 refills | Status: DC

## 2018-05-16 NOTE — Telephone Encounter (Signed)
rx sent

## 2018-06-01 DIAGNOSIS — E1165 Type 2 diabetes mellitus with hyperglycemia: Secondary | ICD-10-CM | POA: Diagnosis not present

## 2018-06-01 DIAGNOSIS — R5383 Other fatigue: Secondary | ICD-10-CM | POA: Diagnosis not present

## 2018-06-01 DIAGNOSIS — E7800 Pure hypercholesterolemia, unspecified: Secondary | ICD-10-CM | POA: Diagnosis not present

## 2018-06-08 DIAGNOSIS — E1165 Type 2 diabetes mellitus with hyperglycemia: Secondary | ICD-10-CM | POA: Diagnosis not present

## 2018-06-08 DIAGNOSIS — I10 Essential (primary) hypertension: Secondary | ICD-10-CM | POA: Diagnosis not present

## 2018-06-08 DIAGNOSIS — E7800 Pure hypercholesterolemia, unspecified: Secondary | ICD-10-CM | POA: Diagnosis not present

## 2018-06-08 DIAGNOSIS — G609 Hereditary and idiopathic neuropathy, unspecified: Secondary | ICD-10-CM | POA: Diagnosis not present

## 2018-06-08 DIAGNOSIS — E11319 Type 2 diabetes mellitus with unspecified diabetic retinopathy without macular edema: Secondary | ICD-10-CM | POA: Diagnosis not present

## 2018-07-24 ENCOUNTER — Other Ambulatory Visit: Payer: Self-pay | Admitting: Rheumatology

## 2018-07-24 ENCOUNTER — Telehealth: Payer: Self-pay | Admitting: Rheumatology

## 2018-07-24 MED ORDER — SULFASALAZINE 500 MG PO TABS: 1000.0000 mg | ORAL_TABLET | Freq: Two times a day (BID) | ORAL | 0 refills | Status: DC

## 2018-07-24 NOTE — Telephone Encounter (Signed)
Last Visit: 04/06/18 Next visit: 10/04/18 Labs: 04/06/18 WNL  Patient's wife advised he is due to update labs. Patient will come this week to update labs.   Okay to refill per Dr. Estanislado Pandy

## 2018-07-24 NOTE — Telephone Encounter (Signed)
Patient's wife left a message on voicemail stating patient needs refill on Sulfasalazine sent to [facility].

## 2018-07-28 ENCOUNTER — Other Ambulatory Visit: Payer: Self-pay

## 2018-07-28 DIAGNOSIS — Z79899 Other long term (current) drug therapy: Secondary | ICD-10-CM

## 2018-07-29 LAB — COMPLETE METABOLIC PANEL WITH GFR
AG Ratio: 1.6 (calc) (ref 1.0–2.5)
ALT: 19 U/L (ref 9–46)
AST: 20 U/L (ref 10–35)
Albumin: 4.1 g/dL (ref 3.6–5.1)
Alkaline phosphatase (APISO): 83 U/L (ref 35–144)
BUN: 19 mg/dL (ref 7–25)
CO2: 27 mmol/L (ref 20–32)
Calcium: 9.4 mg/dL (ref 8.6–10.3)
Chloride: 104 mmol/L (ref 98–110)
Creat: 0.75 mg/dL (ref 0.70–1.25)
GFR, Est African American: 108 mL/min/{1.73_m2} (ref >=60)
GFR, Est Non African American: 94 mL/min/{1.73_m2} (ref >=60)
Globulin: 2.6 g/dL (calc) (ref 1.9–3.7)
Glucose, Bld: 175 mg/dL — ABNORMAL HIGH (ref 65–99)
Potassium: 4.9 mmol/L (ref 3.5–5.3)
Sodium: 141 mmol/L (ref 135–146)
Total Bilirubin: 0.4 mg/dL (ref 0.2–1.2)
Total Protein: 6.7 g/dL (ref 6.1–8.1)

## 2018-07-29 LAB — CBC WITH DIFFERENTIAL/PLATELET
Absolute Monocytes: 950 cells/uL (ref 200–950)
Basophils Absolute: 50 cells/uL (ref 0–200)
Basophils Relative: 0.7 %
Eosinophils Absolute: 122 cells/uL (ref 15–500)
Eosinophils Relative: 1.7 %
HCT: 52.1 % — ABNORMAL HIGH (ref 38.5–50.0)
Hemoglobin: 17.5 g/dL — ABNORMAL HIGH (ref 13.2–17.1)
Lymphs Abs: 2448 cells/uL (ref 850–3900)
MCH: 31.6 pg (ref 27.0–33.0)
MCHC: 33.6 g/dL (ref 32.0–36.0)
MCV: 94.2 fL (ref 80.0–100.0)
MPV: 9.8 fL (ref 7.5–12.5)
Monocytes Relative: 13.2 %
Neutro Abs: 3629 cells/uL (ref 1500–7800)
Neutrophils Relative %: 50.4 %
Platelets: 188 10*3/uL (ref 140–400)
RBC: 5.53 10*6/uL (ref 4.20–5.80)
RDW: 12.7 % (ref 11.0–15.0)
Total Lymphocyte: 34 %
WBC: 7.2 10*3/uL (ref 3.8–10.8)

## 2018-08-08 ENCOUNTER — Telehealth: Payer: Self-pay | Admitting: *Deleted

## 2018-08-08 MED ORDER — SULFASALAZINE 500 MG PO TABS: 1000.0000 mg | ORAL_TABLET | Freq: Two times a day (BID) | ORAL | 0 refills | Status: DC

## 2018-08-08 NOTE — Telephone Encounter (Signed)
Last Visit: 04/06/18 Next visit: 10/04/18 Labs: 07/28/18 Glucose is elevated-175. Rest of CMP WNL. Hgb and Hct are borderline elevated. Rest of CBC is WNL  Okay to refill per Dr. Estanislado Pandy

## 2018-08-08 NOTE — Telephone Encounter (Signed)
RX for SSZ needs to be sent to [facility]. Please call patient at [phone]. Patient has not taken SSZ x 2 weeks.

## 2018-09-21 NOTE — Progress Notes (Signed)
Office Visit Note  Patient: Alan Wang             Date of Birth: [birth year]           MRN: [id number]             PCP: Aura Dials, MD Referring: Aura Dials, MD Visit Date: 10/04/2018 Occupation: @GUAROCC @  Subjective:  Medication monitoring    History of Present Illness: Alan Wang is a 68 y.o. male with history of inflammatory arthritis.  He is taking Sulfasalazine 500 mg 2 tablets BID.  He has been tolerating SSZ.  He denies any joint pain or joint swelling.  He denies any morning stiffness.  He takes tramadol 50 mg 1 tablet by mouth at bedtime as needed for pain relief.  He will take Robaxin 500 mg 1 tablet by mouth twice daily as needed for muscle spasms.  He takes Robaxin very sparingly.  Activities of Daily Living:  Patient reports morning stiffness for 0 minutes.   Patient Denies nocturnal pain.  Difficulty dressing/grooming: Denies Difficulty climbing stairs: Denies Difficulty getting out of chair: Reports Difficulty using hands for taps, buttons, cutlery, and/or writing: Reports  Review of Systems  Constitutional: Negative for fatigue.  HENT: Positive for mouth dryness. Negative for mouth sores and nose dryness.   Eyes: Negative for pain, itching and dryness.  Respiratory: Negative for shortness of breath, wheezing and difficulty breathing.   Cardiovascular: Negative for chest pain, palpitations and swelling in legs/feet.  Gastrointestinal: Negative for abdominal pain, constipation and diarrhea.  Endocrine: Negative for increased urination.  Genitourinary: Negative for painful urination and pelvic pain.  Musculoskeletal: Negative for arthralgias, joint pain, joint swelling and morning stiffness.  Skin: Negative for rash and redness.  Allergic/Immunologic: Negative for susceptible to infections.  Neurological: Negative for dizziness, light-headedness, headaches, memory loss and weakness.  Hematological: Positive for bruising/bleeding tendency.   Psychiatric/Behavioral: Negative for confusion. The patient is not nervous/anxious.     PMFS History:  Patient Active Problem List   Diagnosis Date Noted  . TIA (transient ischemic attack) 06/22/2016  . Type 2 diabetes mellitus without complication ([place]) 29/51/8841  . Primary osteoarthritis of both hands 06/03/2016  . Primary osteoarthritis of both knees 06/03/2016  . Generalized pain 06/03/2016  . Memory difficulties 06/03/2016  . History of diabetes mellitus 06/03/2016  . History of coronary artery disease 06/03/2016  . History of hypertension 06/03/2016  . History of hypercholesterolemia 06/03/2016  . High risk medication use 06/03/2016  . History of high cholesterol 06/03/2016  . ANA positive 06/03/2016  . Inflammatory arthritis 10/29/2015  . Vitamin D deficiency 10/29/2015  . Fatigue 09/24/2015  . MGUS (monoclonal gammopathy of unknown significance) 09/22/2015  . Vertigo 10/31/2013  . Chronic cough 11/16/2012  . CAD (coronary artery disease), native coronary artery 03/08/2011  . Postsurgical percutaneous transluminal coronary angioplasty (PTCA) status 03/08/2011  . Type II or unspecified type diabetes mellitus with other coma, not stated as uncontrolled 03/08/2011  . Hypertension associated with diabetes ([place]) 03/08/2011  . Shortness of breath 03/08/2011  . Mixed hyperlipidemia 03/08/2011    Past Medical History:  Diagnosis Date  . Arthritis   . Asthma   . Coronary artery disease   . Diabetes mellitus    type 2, insulin dependent for 30+ years  . History of kidney stones 11/2012  . Hyperlipemia   . Hypertension   . IDDM (insulin dependent diabetes mellitus) ([place])   . Shortness of breath   . Stroke [facility])  hx TIA    Family History  Problem Relation Age of Onset  . Emphysema Mother   . COPD Mother   . Alzheimer's disease Father   . Asthma Paternal Aunt   . Heart attack Paternal Grandfather    Past Surgical History:  Procedure Laterality Date  . ANGIOPLASTY   04/06/2011   x3   . CARDIAC CATHETERIZATION    . CARDIAC CATHETERIZATION N/A 09/16/2015   Procedure: Left Heart Cath and Coronary Angiography;  Surgeon: Adrian Prows, MD;  Location: [facility];  Service: Cardiovascular;  Laterality: N/A;  . cataracts    . CHOLECYSTECTOMY    . HERNIA REPAIR     x2  . LEFT HEART CATHETERIZATION WITH CORONARY ANGIOGRAM N/A 03/08/2011   Procedure: LEFT HEART CATHETERIZATION WITH CORONARY ANGIOGRAM;  Surgeon: Laverda Page, MD;  Location: [facility] CATH LAB;  Service: Cardiovascular;  Laterality: N/A;  . PERCUTANEOUS CORONARY STENT INTERVENTION (PCI-S) N/A 04/06/2011   Procedure: PERCUTANEOUS CORONARY STENT INTERVENTION (PCI-S);  Surgeon: Laverda Page, MD;  Location: [facility] CATH LAB;  Service: Cardiovascular;  Laterality: N/A;   Social History   Social History Narrative   Patient lives at home with his spouse.   Caffeine Use: occasionally   Immunization History  Administered Date(s) Administered  . Pneumococcal Polysaccharide-23 12/14/2011     Objective: Vital Signs: BP 127/78 (BP Location: Left Arm, Patient Position: Sitting, Cuff Size: Normal)   Pulse 68   Resp 14   Ht 5\' 10"  (1.778 m)   Wt 214 lb 9.6 oz (97.3 kg)   BMI 30.79 kg/m    Physical Exam Vitals signs and nursing note reviewed.  Constitutional:      Appearance: He is well-developed.  HENT:     Head: Normocephalic and atraumatic.  Eyes:     Conjunctiva/sclera: Conjunctivae normal.     Pupils: Pupils are equal, round, and reactive to light.  Neck:     Musculoskeletal: Normal range of motion and neck supple.  Cardiovascular:     Rate and Rhythm: Normal rate and regular rhythm.     Heart sounds: Normal heart sounds.  Pulmonary:     Effort: Pulmonary effort is normal.     Breath sounds: Normal breath sounds.  Abdominal:     General: Bowel sounds are normal.     Palpations: Abdomen is soft.  Skin:    General: Skin is warm and dry.     Capillary Refill: Capillary refill takes  less than 2 seconds.  Neurological:     Mental Status: He is alert and oriented to person, place, and time.  Psychiatric:        Behavior: Behavior normal.      Musculoskeletal Exam:C-spine, thoracic spine, and lumbar spine good ROM. No midline spinal tenderness.  No SI joint tenderness.  Shoulder joints, elbow joints, wrist joints Mild ulnar deviation bilaterally.  PIP and DIP synovial thickening consistent with osteoarthritis of both hands.  Hip joints, knee joints, ankle joints, MTPs, PIPs, and DIPs good ROM with no synovitis.  No warmth or effusion of knee joints.  No tenderness or inflammation of ankle joints.    CDAI Exam: CDAI Score: - Patient Global: -; Provider Global: - Swollen: -; Tender: - Joint Exam   No joint exam has been documented for this visit   There is currently no information documented on the homunculus. Go to the Rheumatology activity and complete the homunculus joint exam.  Investigation: No additional findings.  Imaging: No results found.  Recent  Labs: Lab Results  Component Value Date   WBC 7.2 07/28/2018   HGB 17.5 (H) 07/28/2018   PLT 188 07/28/2018   NA 141 07/28/2018   K 4.9 07/28/2018   CL 104 07/28/2018   CO2 27 07/28/2018   GLUCOSE 175 (H) 07/28/2018   BUN 19 07/28/2018   CREATININE 0.75 07/28/2018   BILITOT 0.4 07/28/2018   ALKPHOS 76 10/26/2016   AST 20 07/28/2018   ALT 19 07/28/2018   PROT 6.7 07/28/2018   ALBUMIN 4.2 10/26/2016   CALCIUM 9.4 07/28/2018   GFRAA 108 07/28/2018    Speciality Comments: No specialty comments available.  Procedures:  No procedures performed Allergies: Avandia [rosiglitazone maleate]   Assessment / Plan:     Visit Diagnoses: Inflammatory arthritis - He has no synovitis on exam.  He has not had any recent flares.  He is clinically doing well on sulfasalazine 500 mg 2 tablets by mouth twice daily.  He has not missed any doses of sulfasalazine recently.  He has no joint pain or joint swelling at this  time.  He has no morning stiffness.  He takes tramadol 50 mg 1 tablet by mouth at bedtime as needed for pain relief.  He takes tramadol very sparingly.  He has no concerns at this time.  He will continue on his current treatment regimen.  He does not need any refills at this time.  He is advised to notify us if he develops increased joint pain or joint swelling.  He will follow-up in the office in 5 months  High risk medication use - Sulfasalazine 1000 mg twice daily-CBC and CMP will be drawn today to monitor for drug toxicity.  He will return for lab work in October and every 3 months to monitor for lab abnormalities.- Plan: COMPLETE METABOLIC PANEL WITH GFR, CBC with Differential/Platelet  Primary osteoarthritis of both hands - He has PIP and DIP synovial thickening consistent with osteoarthritis of bilateral hands.  He has no tenderness or synovitis.  He has complete fist formation bilaterally.  Joint protection and muscle strengthening were discussed.   Primary osteoarthritis of both knees - He has good range of motion with no discomfort.  No warmth or effusion noted.  He has no discomfort at this time.  Other chronic pain - He takes tramadol 50 mg 1 tablet by mouth at bedtime as needed for pain relief.  He takes tramadol very sparingly.  UDS and narcotic agreement were updated today on 10/04/2018.  Medication monitoring encounter -UDS and narcotic agreement will be updated today on 10/04/2018.  Plan: Pain Mgmt, Profile 5 w/Conf, U, Pain Mgmt, Tramadol w/medMATCH, U,   Other medical conditions are listed as follows:  History of vitamin D deficiency   History of coronary artery disease   History of hypercholesterolemia   MGUS (monoclonal gammopathy of unknown significance)   History of arthroscopy of left knee   History of hypertension   History of diabetes mellitus   History of TIA (transient ischemic attack)    Orders: Orders Placed This Encounter  Procedures  . Pain Mgmt,  Profile 5 w/Conf, U  . Pain Mgmt, Tramadol w/medMATCH, U  . COMPLETE METABOLIC PANEL WITH GFR  . CBC with Differential/Platelet   No orders of the defined types were placed in this encounter.     Follow-Up Instructions: Return in about 5 months (around 03/06/2019) for Inflammatory arthritis .   Ofilia Neas, PA-C   I examined and evaluated the patient with Hazel Sams  PA.  Patient had no synovitis on my examination.  He has been tolerating sulfasalazine well.  We will check labs today.  The plan of care was discussed as noted above.  Bo Merino, MD  Note - This record has been created using Editor, commissioning.  Chart creation errors have been sought, but may not always  have been located. Such creation errors do not reflect on  the standard of medical care.

## 2018-10-04 ENCOUNTER — Ambulatory Visit (INDEPENDENT_AMBULATORY_CARE_PROVIDER_SITE_OTHER): Payer: Medicare Other | Admitting: Rheumatology

## 2018-10-04 ENCOUNTER — Encounter: Payer: Self-pay | Admitting: Rheumatology

## 2018-10-04 ENCOUNTER — Other Ambulatory Visit: Payer: Self-pay

## 2018-10-04 VITALS — BP 127/78 | HR 68 | Resp 14 | Ht 70.0 in | Wt 214.6 lb

## 2018-10-04 DIAGNOSIS — G8929 Other chronic pain: Secondary | ICD-10-CM

## 2018-10-04 DIAGNOSIS — Z9889 Other specified postprocedural states: Secondary | ICD-10-CM | POA: Diagnosis not present

## 2018-10-04 DIAGNOSIS — Z79899 Other long term (current) drug therapy: Secondary | ICD-10-CM | POA: Diagnosis not present

## 2018-10-04 DIAGNOSIS — Z8639 Personal history of other endocrine, nutritional and metabolic disease: Secondary | ICD-10-CM

## 2018-10-04 DIAGNOSIS — M19041 Primary osteoarthritis, right hand: Secondary | ICD-10-CM

## 2018-10-04 DIAGNOSIS — M19042 Primary osteoarthritis, left hand: Secondary | ICD-10-CM

## 2018-10-04 DIAGNOSIS — M170 Bilateral primary osteoarthritis of knee: Secondary | ICD-10-CM

## 2018-10-04 DIAGNOSIS — Z8673 Personal history of transient ischemic attack (TIA), and cerebral infarction without residual deficits: Secondary | ICD-10-CM

## 2018-10-04 DIAGNOSIS — M1990 Unspecified osteoarthritis, unspecified site: Secondary | ICD-10-CM | POA: Diagnosis not present

## 2018-10-04 DIAGNOSIS — Z5181 Encounter for therapeutic drug level monitoring: Secondary | ICD-10-CM

## 2018-10-04 DIAGNOSIS — D472 Monoclonal gammopathy: Secondary | ICD-10-CM

## 2018-10-04 DIAGNOSIS — Z8679 Personal history of other diseases of the circulatory system: Secondary | ICD-10-CM

## 2018-10-04 NOTE — Patient Instructions (Addendum)
Standing Labs We placed an order today for your standing lab work.    Please come back and get your standing labs end of August and then every 3 months.   We have open lab daily Monday through Thursday from 8:30-12:30 PM and 1:30-4:30 PM and Friday from 8:30-12:30 PM and 1:30 -4:00 PM at the office of Dr. Bo Merino.   You may experience shorter wait times on Monday and Friday afternoons. The office is located at [address], [place], [place], [place] [address] No appointment is necessary.   Labs are drawn by [facility].  You may receive a bill from [facility] for your lab work.  If you wish to have your labs drawn at another location, please call the office 24 hours in advance to send orders.  If you have any questions regarding directions or hours of operation,  please call [phone].   Just as a reminder please drink plenty of water prior to coming for your lab work. Thanks!

## 2018-10-05 NOTE — Progress Notes (Signed)
Glucose is 113.  Rest of CMP WNL.  Hemoglobin and hematocrit are elevated but stable.  We will continue to monitor.  Rest of CBC WNL.

## 2018-10-06 LAB — CBC WITH DIFFERENTIAL/PLATELET
Absolute Monocytes: 800 cells/uL (ref 200–950)
Basophils Absolute: 39 cells/uL (ref 0–200)
Basophils Relative: 0.6 %
Eosinophils Absolute: 59 cells/uL (ref 15–500)
Eosinophils Relative: 0.9 %
HCT: 51.2 % — ABNORMAL HIGH (ref 38.5–50.0)
Hemoglobin: 17.3 g/dL — ABNORMAL HIGH (ref 13.2–17.1)
Lymphs Abs: 2327 cells/uL (ref 850–3900)
MCH: 32.2 pg (ref 27.0–33.0)
MCHC: 33.8 g/dL (ref 32.0–36.0)
MCV: 95.2 fL (ref 80.0–100.0)
MPV: 9.5 fL (ref 7.5–12.5)
Monocytes Relative: 12.3 %
Neutro Abs: 3276 cells/uL (ref 1500–7800)
Neutrophils Relative %: 50.4 %
Platelets: 177 10*3/uL (ref 140–400)
RBC: 5.38 10*6/uL (ref 4.20–5.80)
RDW: 13.4 % (ref 11.0–15.0)
Total Lymphocyte: 35.8 %
WBC: 6.5 10*3/uL (ref 3.8–10.8)

## 2018-10-06 LAB — PAIN MGMT, PROFILE 5 W/CONF, U
Amphetamines: NEGATIVE ng/mL
Barbiturates: NEGATIVE ng/mL
Benzodiazepines: NEGATIVE ng/mL
Cocaine Metabolite: NEGATIVE ng/mL
Creatinine: 86.7 mg/dL
Marijuana Metabolite: NEGATIVE ng/mL
Methadone Metabolite: NEGATIVE ng/mL
Opiates: NEGATIVE ng/mL
Oxidant: NEGATIVE ug/mL
Oxycodone: NEGATIVE ng/mL
pH: 5 (ref 4.5–9.0)

## 2018-10-06 LAB — COMPLETE METABOLIC PANEL WITH GFR
AG Ratio: 1.8 (calc) (ref 1.0–2.5)
ALT: 17 U/L (ref 9–46)
AST: 16 U/L (ref 10–35)
Albumin: 4.2 g/dL (ref 3.6–5.1)
Alkaline phosphatase (APISO): 68 U/L (ref 35–144)
BUN: 14 mg/dL (ref 7–25)
CO2: 29 mmol/L (ref 20–32)
Calcium: 9.7 mg/dL (ref 8.6–10.3)
Chloride: 105 mmol/L (ref 98–110)
Creat: 0.73 mg/dL (ref 0.70–1.25)
GFR, Est African American: 110 mL/min/{1.73_m2} (ref >=60)
GFR, Est Non African American: 95 mL/min/{1.73_m2} (ref >=60)
Globulin: 2.3 g/dL (calc) (ref 1.9–3.7)
Glucose, Bld: 113 mg/dL — ABNORMAL HIGH (ref 65–99)
Potassium: 4.7 mmol/L (ref 3.5–5.3)
Sodium: 142 mmol/L (ref 135–146)
Total Bilirubin: 0.5 mg/dL (ref 0.2–1.2)
Total Protein: 6.5 g/dL (ref 6.1–8.1)

## 2018-10-06 LAB — PAIN MGMT, TRAMADOL W/MEDMATCH, U
Desmethyltramadol: NEGATIVE ng/mL
Tramadol: NEGATIVE ng/mL

## 2018-10-06 NOTE — Progress Notes (Signed)
UDS is negative for tramadol.  He takes tramadol very sparingly.

## 2018-10-16 ENCOUNTER — Other Ambulatory Visit: Payer: Self-pay

## 2018-10-16 DIAGNOSIS — G459 Transient cerebral ischemic attack, unspecified: Secondary | ICD-10-CM

## 2018-10-16 MED ORDER — CARVEDILOL 12.5 MG PO TABS: 12.5000 mg | ORAL_TABLET | Freq: Two times a day (BID) | ORAL | 3 refills | Status: DC

## 2018-10-24 ENCOUNTER — Other Ambulatory Visit: Payer: Self-pay | Admitting: Rheumatology

## 2018-10-24 NOTE — Telephone Encounter (Signed)
Last Visit: 10/04/18 Next Visit: 04/03/19 Labs: 10/04/18 glucose is 113. Rest of CMP WNL. Hemoglobin and hematocrit are elevated but stable. Rest of CBC WNL.  Okay to refill per Dr. Estanislado Pandy

## 2018-11-11 ENCOUNTER — Other Ambulatory Visit: Payer: Self-pay | Admitting: Cardiology

## 2018-12-14 DIAGNOSIS — E7800 Pure hypercholesterolemia, unspecified: Secondary | ICD-10-CM | POA: Diagnosis not present

## 2018-12-14 DIAGNOSIS — E11319 Type 2 diabetes mellitus with unspecified diabetic retinopathy without macular edema: Secondary | ICD-10-CM | POA: Diagnosis not present

## 2018-12-14 DIAGNOSIS — L0390 Cellulitis, unspecified: Secondary | ICD-10-CM | POA: Diagnosis not present

## 2018-12-14 DIAGNOSIS — E1165 Type 2 diabetes mellitus with hyperglycemia: Secondary | ICD-10-CM | POA: Diagnosis not present

## 2018-12-14 DIAGNOSIS — I10 Essential (primary) hypertension: Secondary | ICD-10-CM | POA: Diagnosis not present

## 2018-12-14 DIAGNOSIS — Z23 Encounter for immunization: Secondary | ICD-10-CM | POA: Diagnosis not present

## 2018-12-14 DIAGNOSIS — G609 Hereditary and idiopathic neuropathy, unspecified: Secondary | ICD-10-CM | POA: Diagnosis not present

## 2018-12-25 DIAGNOSIS — D1801 Hemangioma of skin and subcutaneous tissue: Secondary | ICD-10-CM | POA: Diagnosis not present

## 2018-12-25 DIAGNOSIS — L570 Actinic keratosis: Secondary | ICD-10-CM | POA: Diagnosis not present

## 2018-12-25 DIAGNOSIS — L814 Other melanin hyperpigmentation: Secondary | ICD-10-CM | POA: Diagnosis not present

## 2018-12-25 DIAGNOSIS — L821 Other seborrheic keratosis: Secondary | ICD-10-CM | POA: Diagnosis not present

## 2018-12-25 DIAGNOSIS — L720 Epidermal cyst: Secondary | ICD-10-CM | POA: Diagnosis not present

## 2019-01-19 ENCOUNTER — Other Ambulatory Visit: Payer: Self-pay | Admitting: Rheumatology

## 2019-01-19 ENCOUNTER — Other Ambulatory Visit: Payer: Self-pay

## 2019-01-19 DIAGNOSIS — Z79899 Other long term (current) drug therapy: Secondary | ICD-10-CM

## 2019-01-19 NOTE — Telephone Encounter (Signed)
Last Visit: 10/04/2018 Next Visit: 04/03/2019 Labs: 10/04/2018 Glucose is 113. Rest of CMP WNL. Hemoglobin and hematocrit are elevated but stable. We will continue to monitor. Rest of CBC WNL.   Advised patient he is due to update labs, patient verbalized understanding and will come in 01/22/2019. Patient would like a 90 day supply after lab results.

## 2019-01-22 ENCOUNTER — Other Ambulatory Visit: Payer: Self-pay

## 2019-01-22 DIAGNOSIS — Z79899 Other long term (current) drug therapy: Secondary | ICD-10-CM

## 2019-01-22 NOTE — Telephone Encounter (Signed)
Patient updated labs 01/22/19  Okay to refill per Dr. Estanislado Pandy

## 2019-01-23 LAB — CBC WITH DIFFERENTIAL/PLATELET
Absolute Monocytes: 736 cells/uL (ref 200–950)
Basophils Absolute: 32 cells/uL (ref 0–200)
Basophils Relative: 0.5 %
Eosinophils Absolute: 83 cells/uL (ref 15–500)
Eosinophils Relative: 1.3 %
HCT: 49.4 % (ref 38.5–50.0)
Hemoglobin: 16.4 g/dL (ref 13.2–17.1)
Lymphs Abs: 2483 cells/uL (ref 850–3900)
MCH: 31 pg (ref 27.0–33.0)
MCHC: 33.2 g/dL (ref 32.0–36.0)
MCV: 93.4 fL (ref 80.0–100.0)
MPV: 10.1 fL (ref 7.5–12.5)
Monocytes Relative: 11.5 %
Neutro Abs: 3066 cells/uL (ref 1500–7800)
Neutrophils Relative %: 47.9 %
Platelets: 184 10*3/uL (ref 140–400)
RBC: 5.29 10*6/uL (ref 4.20–5.80)
RDW: 13 % (ref 11.0–15.0)
Total Lymphocyte: 38.8 %
WBC: 6.4 10*3/uL (ref 3.8–10.8)

## 2019-01-23 LAB — COMPLETE METABOLIC PANEL WITH GFR
AG Ratio: 2.2 (calc) (ref 1.0–2.5)
ALT: 16 U/L (ref 9–46)
AST: 19 U/L (ref 10–35)
Albumin: 4.1 g/dL (ref 3.6–5.1)
Alkaline phosphatase (APISO): 71 U/L (ref 35–144)
BUN: 15 mg/dL (ref 7–25)
CO2: 29 mmol/L (ref 20–32)
Calcium: 9.3 mg/dL (ref 8.6–10.3)
Chloride: 104 mmol/L (ref 98–110)
Creat: 0.92 mg/dL (ref 0.70–1.18)
GFR, Est African American: 97 mL/min/{1.73_m2} (ref >=60)
GFR, Est Non African American: 84 mL/min/{1.73_m2} (ref >=60)
Globulin: 1.9 g/dL (calc) (ref 1.9–3.7)
Glucose, Bld: 154 mg/dL — ABNORMAL HIGH (ref 65–99)
Potassium: 4.5 mmol/L (ref 3.5–5.3)
Sodium: 139 mmol/L (ref 135–146)
Total Bilirubin: 0.5 mg/dL (ref 0.2–1.2)
Total Protein: 6 g/dL — ABNORMAL LOW (ref 6.1–8.1)

## 2019-01-23 NOTE — Progress Notes (Signed)
Glucose is elevated.  Although it was nonfasting.  Ideally nonfasting glucose should be less than 140.  Please notify patient.

## 2019-03-05 ENCOUNTER — Other Ambulatory Visit: Payer: Self-pay

## 2019-03-05 ENCOUNTER — Encounter: Payer: Self-pay | Admitting: Cardiology

## 2019-03-05 ENCOUNTER — Ambulatory Visit (INDEPENDENT_AMBULATORY_CARE_PROVIDER_SITE_OTHER): Payer: Medicare Other | Admitting: Cardiology

## 2019-03-05 VITALS — BP 122/75 | HR 73 | Ht 70.0 in | Wt 213.0 lb

## 2019-03-05 DIAGNOSIS — I2510 Atherosclerotic heart disease of native coronary artery without angina pectoris: Secondary | ICD-10-CM

## 2019-03-05 DIAGNOSIS — I10 Essential (primary) hypertension: Secondary | ICD-10-CM | POA: Diagnosis not present

## 2019-03-05 DIAGNOSIS — E782 Mixed hyperlipidemia: Secondary | ICD-10-CM

## 2019-03-05 DIAGNOSIS — Z8673 Personal history of transient ischemic attack (TIA), and cerebral infarction without residual deficits: Secondary | ICD-10-CM | POA: Diagnosis not present

## 2019-03-05 MED ORDER — NITROGLYCERIN 0.4 MG SL SUBL: 0.4000 mg | SUBLINGUAL_TABLET | SUBLINGUAL | 3 refills | Status: DC | PRN

## 2019-03-05 NOTE — Progress Notes (Signed)
Primary Physician/Referring:  Aura Dials, MD  Patient ID: Alan Wang, male    DOB: Mar 09, 1950, 68 y.o.   MRN: [id number]  Chief Complaint  Patient presents with  . Coronary Artery Disease  . Hyperlipidemia   HPI:    Alan Wang  is a 68 y.o. Caucasian male with history of known coronary artery disease. He has had coronary angioplasty to his LAD, circumflex and also ramus intermediate vessels. He has history of diabetes mellitus with diabetic retinopathy, hypertension and hyperlipidemia. Last coronary angiography on 09/16/2015 after a abnormal stress test revealing flush occluded moderate sized PL branch of RCA but had excellent collaterals.  Patient is here on annual visit and follow-up of coronary artery disease, he is presently asymptomatic. States that his diabetes is well controlled.  No dizziness, no symptoms to suggest claudication, no leg edema, no chest pain.  Past Medical History:  Diagnosis Date  . Arthritis   . Asthma   . Coronary artery disease   . Diabetes mellitus    type 2, insulin dependent for 30+ years  . History of kidney stones 11/2012  . Hyperlipemia   . Hypertension   . IDDM (insulin dependent diabetes mellitus)   . Shortness of breath   . Stroke ([place])    hx TIA   Past Surgical History:  Procedure Laterality Date  . ANGIOPLASTY  04/06/2011   x3   . CARDIAC CATHETERIZATION    . CARDIAC CATHETERIZATION N/A 09/16/2015   Procedure: Left Heart Cath and Coronary Angiography;  Surgeon: Adrian Prows, MD;  Location: [facility];  Service: Cardiovascular;  Laterality: N/A;  . cataracts    . CHOLECYSTECTOMY    . HERNIA REPAIR     x2  . LEFT HEART CATHETERIZATION WITH CORONARY ANGIOGRAM N/A 03/08/2011   Procedure: LEFT HEART CATHETERIZATION WITH CORONARY ANGIOGRAM;  Surgeon: Laverda Page, MD;  Location: [facility] CATH LAB;  Service: Cardiovascular;  Laterality: N/A;  . PERCUTANEOUS CORONARY STENT INTERVENTION (PCI-S) N/A 04/06/2011   Procedure:  PERCUTANEOUS CORONARY STENT INTERVENTION (PCI-S);  Surgeon: Laverda Page, MD;  Location: [facility] CATH LAB;  Service: Cardiovascular;  Laterality: N/A;   Social History   Socioeconomic History  . Marital status: Married    Spouse name: Vaughan Basta  . Number of children: 2  . Years of education: College  . Highest education level: Not on file  Occupational History  . Occupation: retired    Comment: Multimedia programmer  Tobacco Use  . Smoking status: Never Smoker  . Smokeless tobacco: Never Used  Substance and Sexual Activity  . Alcohol use: Yes    Comment: occasionally  . Drug use: No  . Sexual activity: Yes  Other Topics Concern  . Not on file  Social History Narrative   Patient lives at home with his spouse.   Caffeine Use: occasionally   Social Determinants of Health   Financial Resource Strain:   . Difficulty of Paying Living Expenses: Not on file  Food Insecurity:   . Worried About Charity fundraiser in the Last Year: Not on file  . Ran Out of Food in the Last Year: Not on file  Transportation Needs:   . Lack of Transportation (Medical): Not on file  . Lack of Transportation (Non-Medical): Not on file  Physical Activity:   . Days of Exercise per Week: Not on file  . Minutes of Exercise per Session: Not on file  Stress:   . Feeling of Stress : Not  on file  Social Connections:   . Frequency of Communication with Friends and Family: Not on file  . Frequency of Social Gatherings with Friends and Family: Not on file  . Attends Religious Services: Not on file  . Active Member of Clubs or Organizations: Not on file  . Attends Archivist Meetings: Not on file  . Marital Status: Not on file  Intimate Partner Violence:   . Fear of Current or Ex-Partner: Not on file  . Emotionally Abused: Not on file  . Physically Abused: Not on file  . Sexually Abused: Not on file   ROS  Review of Systems  Constitution: Negative for chills, decreased appetite, malaise/fatigue  and weight gain.  Cardiovascular: Negative for dyspnea on exertion, leg swelling and syncope.  Endocrine: Negative for cold intolerance.  Hematologic/Lymphatic: Does not bruise/bleed easily.  Musculoskeletal: Positive for arthritis (knee bilateral.  h/o rheumatoid arthritis). Negative for joint swelling.  Gastrointestinal: Negative for abdominal pain, anorexia, change in bowel habit, hematochezia and melena.  Neurological: Negative for headaches and light-headedness.  Psychiatric/Behavioral: Negative for depression and substance abuse.  All other systems reviewed and are negative.  Objective  Blood pressure 122/75, pulse 73, height 5\' 10"  (1.778 m), weight 213 lb (96.6 kg), SpO2 94 %.  Vitals with BMI 03/05/2019 10/04/2018 04/06/2018  Height 5\' 10"  5\' 10"  5\' 10"   Weight 213 lbs 214 lbs 10 oz 208 lbs 10 oz  BMI 30.56 [id number]  Systolic [id number]  Diastolic 75 78 64  Pulse 73 68 86     Physical Exam  Constitutional:  Well build and mildly obese in no acute distress.  HENT:  Head: Atraumatic.  Eyes: Conjunctivae are normal.  Neck: No JVD present. No thyromegaly present.  Cardiovascular: Normal rate, regular rhythm and normal heart sounds. Exam reveals no gallop.  No murmur heard. Pulses:      Dorsalis pedis pulses are 2+ on the right side and 2+ on the left side.       Posterior tibial pulses are 1+ on the right side and 1+ on the left side.  No leg edema, no JVD.  Pulmonary/Chest: Effort normal and breath sounds normal.  Abdominal: Soft. Bowel sounds are normal.  Musculoskeletal:        General: Normal range of motion.     Cervical back: Neck supple.  Neurological: He is alert.  Skin: Skin is warm and dry.  Psychiatric: He has a normal mood and affect.   Laboratory examination:   Recent Labs    07/28/18 1442 10/04/18 1358 01/22/19 1030  NA 141 142 139  K 4.9 4.7 4.5  CL 104 105 104  CO2 27 29 29   GLUCOSE 175* 113* 154*  BUN 19 14 15   CREATININE 0.75 0.73 0.92    CALCIUM 9.4 9.7 9.3  GFRNONAA 94 95 84  GFRAA 108 110 97   CrCl cannot be calculated (Patient's most recent lab result is older than the maximum 21 days allowed.).  CMP Latest Ref Rng & Units 01/22/2019 10/04/2018 07/28/2018  Glucose 65 - 99 mg/dL 154(H) 113(H) 175(H)  BUN 7 - 25 mg/dL 15 14 19   Creatinine 0.70 - 1.18 mg/dL 0.92 0.73 0.75  Sodium 135 - 146 mmol/L 139 142 141  Potassium 3.5 - 5.3 mmol/L 4.5 4.7 4.9  Chloride 98 - 110 mmol/L 104 105 104  CO2 20 - 32 mmol/L 29 29 27   Calcium 8.6 - 10.3 mg/dL 9.3 9.7 9.4  Total Protein 6.1 -  8.1 g/dL 6.0(L) 6.5 6.7  Total Bilirubin 0.2 - 1.2 mg/dL 0.5 0.5 0.4  Alkaline Phos 40 - 115 U/L - - -  AST 10 - 35 U/L 19 16 20   ALT 9 - 46 U/L 16 17 19    CBC Latest Ref Rng & Units 01/22/2019 10/04/2018 07/28/2018  WBC 3.8 - 10.8 Thousand/uL 6.4 6.5 7.2  Hemoglobin 13.2 - 17.1 g/dL 16.4 17.3(H) 17.5(H)  Hematocrit 38.5 - 50.0 % 49.4 51.2(H) 52.1(H)  Platelets 140 - 400 Thousand/uL 184 177 188   Lipid Panel     Component Value Date/Time   CHOL 115 06/23/2016 0617   TRIG 80 06/23/2016 0617   HDL 40 (L) 06/23/2016 0617   CHOLHDL 2.9 06/23/2016 0617   VLDL 16 06/23/2016 0617   LDLCALC 59 06/23/2016 0617   HEMOGLOBIN A1C Lab Results  Component Value Date   HGBA1C 5.8 (H) 06/23/2016   MPG 120 06/23/2016   TSH No results for input(s): TSH in the last 8760 hours.  Medications and allergies   Allergies  Allergen Reactions  . Avandia [Rosiglitazone Maleate] Swelling    Lower extremities      Current Outpatient Medications  Medication Instructions  . aspirin EC 81 mg, Oral, Daily at bedtime  . carvedilol (COREG) 12.5 mg, Oral, 2 times daily with meals  . chlorpheniramine (CHLOR-TRIMETON) 4 mg, Oral, Daily at bedtime  . CINNAMON PO 2,000 mg, Oral, 2 times daily  . DULoxetine (CYMBALTA) 30 mg, Daily at bedtime  . FARXIGA 10 MG TABS tablet No dose, route, or frequency recorded.  . insulin NPH Human (NOVOLIN N) 5-25 Units, Subcutaneous,  See admin instructions, 10 units in morning 20 units bedtime  . insulin regular (NOVOLIN R) 20 Units, Subcutaneous, 2 times daily before meals  . metFORMIN (GLUCOPHAGE-XR) 1,000 mg, Oral, 2 times daily  . methocarbamol (ROBAXIN) 500 mg, Oral, 2 times daily PRN  . naproxen sodium (ALEVE) 220 mg, Oral, 2 times daily with meals  . olmesartan (BENICAR) 20 MG tablet TAKE 1 TABLET DAILY  . rosuvastatin (CRESTOR) 10 mg, Oral, Daily  . sulfaSALAzine (AZULFIDINE) 500 MG tablet TAKE 2 TABLETS(=1,000MG     TOTAL) TWICE A DAY  . traMADol (ULTRAM) 50 mg, Oral, At bedtime PRN  . vitamin C 1,000 mg, Daily  . Vitamin D 2,000 Units, Oral, Daily    Radiology:  No results found.  Cardiac Studies:   Coronary angiogram 09/16/2015:  Mid LAD 3.0 x 23 mm and 2.75 x 12 mm Xience placed 05/25/08, ramus intermediate 3.5 x 15 mm integrity DES placed 03/08/11 and proximal and mid circumflex 2.75 x 22 2 mm resolute placed 04/06/2011 widely patent, occluded PL branch of RCA with collaterals from the circumflex coronary artery.  LVEF normal at 55%.  Lower extremity arterial duplex 03/17/2017: No hemodynamically significant stenoses are identified in the  lower extremity arterial system. This exam reveals mildly decreased perfusion of the right lower extremity and normal perfusion of the left lower extremity (ABI).  RABI 0.86, LABI 1.00. Triphasic waveform(normal) noted at both ankle. Study may suggest minimal diffuse disease.  Echocardiogram  [08/26/2015]: Left ventricle cavity is normal in size. Moderate concentric hypertrophy of the left ventricle. Borderline global hypokinesis. Doppler evidence of grade I (impaired) diastolic dysfunction. Left ventricle regional wall motion findings: No wall motion abnormalities. Visual EF is 45-50%. Calculated EF 45%. Left atrial cavity is mildly dilated. Trace mitral regurgitation. Trace tricuspid regurgitation. Unable to estimate PA pressure due to absence/minimal TR  signal. Compared to the study  done on 02/03/2011, EF previously normal.  Nuclear stress test  [08/22/2015]:  1. Resting EKG demonstrated normal sinus rhythm, normal axis, poor R-wave progression, low voltage complexes. Stress EKG is nondiagnostic for ischemia as its pharmacologic stress test. Stress symptoms included dyspnea. 2. Perfusion imaging study demonstrates a small sized mild lateral wall ischemia noted towards the apex. In addition there is mild anterior wall thinning extending from the mid ventricle towards the apex. The left ventricle is normal in size with rest and stress images. Left ventricular systolic function calculated by QGS was moderately depressed at 37% with global hypokinesis. In a patient with knows severe 3 vessel CAD and PTCA, this represents a high risk study.  Nocturnal Oximetry 09/30/2015:  02 saturation less than 89% for 4 hours and less than 88% for 3 hours, overall low 79%. Patient qualifies for nocturnal oxygen supplementation in Medicare group 1. Referred for sleep study.  Assessment     ICD-10-CM   1. Coronary artery disease involving native coronary artery of native heart without angina pectoris  I25.10 EKG 12-Lead  2. Mixed hyperlipidemia  E78.2   3. Primary hypertension  I10   4. H/O TIA (transient ischemic attack) and stroke 2008  Z86.73     EKG 03/05/2019: Normal sinus rhythm at rate of 69 bpm, normal axis, low voltage complexes, nonspecific T abnormality.No significant change from EKG 03/02/2018.  Recommendations:  No orders of the defined types were placed in this encounter.   Alan Wang  is a 68 y.o. Caucasian male with history of known coronary artery disease. He has had coronary angioplasty to his LAD, circumflex and also ramus intermediate vessels. He has history of diabetes mellitus with diabetic retinopathy, hypertension and hyperlipidemia. Last coronary angiography on 09/16/2015 after a abnormal stress test revealing flush occluded moderate  sized PL branch of RCA but had excellent collaterals.  Since his annual visit, states that his diabetes is now well controlled, lipids are also well controlled and managed by his PCP.  He has not had any recurrence of angina pectoris and is no clinical evidence of heart failure.  I refilled his nitroglycerin, otherwise no changes in her medications were done today.  Blood pressure is also well controlled.  No significant change in his EKG.  I'll see him back in one year.  Adrian Prows, MD, [facility] 03/05/2019, 2:24 PM [facility] [place] Pager: [phone] Office: [phone]

## 2019-03-28 ENCOUNTER — Telehealth: Payer: Self-pay | Admitting: Rheumatology

## 2019-03-28 NOTE — Telephone Encounter (Signed)
Patient's wife Alan Wang called stating they rescheduled Alan Wang's appointment for 05/15/19 when they can both come in the office together.  Alan Wang states Alan Wang is due for labwork on 04/24/19 and is asking if he can wait until his appointment in March.

## 2019-03-28 NOTE — Telephone Encounter (Signed)
Left message to advise patient's wife he may postpone labs until his appointment.

## 2019-03-30 DIAGNOSIS — Z8673 Personal history of transient ischemic attack (TIA), and cerebral infarction without residual deficits: Secondary | ICD-10-CM | POA: Diagnosis not present

## 2019-03-30 DIAGNOSIS — I2510 Atherosclerotic heart disease of native coronary artery without angina pectoris: Secondary | ICD-10-CM | POA: Diagnosis not present

## 2019-03-30 DIAGNOSIS — E119 Type 2 diabetes mellitus without complications: Secondary | ICD-10-CM | POA: Diagnosis not present

## 2019-03-30 DIAGNOSIS — D582 Other hemoglobinopathies: Secondary | ICD-10-CM | POA: Diagnosis not present

## 2019-03-30 DIAGNOSIS — E785 Hyperlipidemia, unspecified: Secondary | ICD-10-CM | POA: Diagnosis not present

## 2019-03-30 DIAGNOSIS — M150 Primary generalized (osteo)arthritis: Secondary | ICD-10-CM | POA: Diagnosis not present

## 2019-03-30 DIAGNOSIS — I10 Essential (primary) hypertension: Secondary | ICD-10-CM | POA: Diagnosis not present

## 2019-03-30 DIAGNOSIS — D751 Secondary polycythemia: Secondary | ICD-10-CM | POA: Diagnosis not present

## 2019-03-30 DIAGNOSIS — R799 Abnormal finding of blood chemistry, unspecified: Secondary | ICD-10-CM | POA: Diagnosis not present

## 2019-04-02 ENCOUNTER — Other Ambulatory Visit: Payer: Medicare Other

## 2019-04-02 ENCOUNTER — Ambulatory Visit: Payer: Medicare Other | Attending: Internal Medicine

## 2019-04-02 DIAGNOSIS — Z23 Encounter for immunization: Secondary | ICD-10-CM

## 2019-04-02 NOTE — Progress Notes (Signed)
   Covid-19 Vaccination Clinic  Name:  Alan Wang    MRN: [id number] DOB: August 30, 1950  04/02/2019  [name] was observed post Covid-19 immunization for 15 minutes without incidence. He was provided with Vaccine Information Sheet and instruction to access the V-Safe system.   [name] was instructed to call 911 with any severe reactions post vaccine: [name] Difficulty breathing  . Swelling of your face and throat  . A fast heartbeat  . A bad rash all over your body  . Dizziness and weakness    Immunizations Administered    Name Date Dose VIS Date Route   Pfizer COVID-19 Vaccine 04/02/2019 11:32 AM 0.3 mL 02/16/2019 Intramuscular   Manufacturer: [place]   Lot: [id number]   [place]: [id number]

## 2019-04-03 ENCOUNTER — Ambulatory Visit: Payer: Medicare Other | Admitting: Rheumatology

## 2019-04-23 ENCOUNTER — Ambulatory Visit: Payer: Medicare Other | Attending: Internal Medicine

## 2019-04-23 ENCOUNTER — Telehealth: Payer: Self-pay

## 2019-04-23 DIAGNOSIS — Z23 Encounter for immunization: Secondary | ICD-10-CM

## 2019-04-23 MED ORDER — SULFASALAZINE 500 MG PO TABS: ORAL_TABLET | ORAL | 0 refills | Status: DC

## 2019-04-23 NOTE — Telephone Encounter (Signed)
Refill request received via fax from [facility] for SSZ.   Last Visit: 10/04/2018 Next Visit: 05/15/2019 Labs: 01/22/2019 Glucose is elevated.   Okay to refill per Dr. Estanislado Pandy.

## 2019-04-23 NOTE — Progress Notes (Signed)
   Covid-19 Vaccination Clinic  Name:  Alan Wang    MRN: [id number] DOB: April 30, 1950  04/23/2019  [name] was observed post Covid-19 immunization for 15 minutes without incidence. He was provided with Vaccine Information Sheet and instruction to access the V-Safe system.   [name] was instructed to call 911 with any severe reactions post vaccine: [name] Difficulty breathing  . Swelling of your face and throat  . A fast heartbeat  . A bad rash all over your body  . Dizziness and weakness    Immunizations Administered    Name Date Dose VIS Date Route   Pfizer COVID-19 Vaccine 04/23/2019 11:16 AM 0.3 mL 02/16/2019 Intramuscular   Manufacturer: [place]   Lot: [id number]   [place]: [id number]

## 2019-04-24 ENCOUNTER — Other Ambulatory Visit: Payer: Self-pay

## 2019-04-24 DIAGNOSIS — Z79899 Other long term (current) drug therapy: Secondary | ICD-10-CM

## 2019-04-25 LAB — COMPLETE METABOLIC PANEL WITH GFR
AG Ratio: 1.9 (calc) (ref 1.0–2.5)
ALT: 17 U/L (ref 9–46)
AST: 17 U/L (ref 10–35)
Albumin: 4.1 g/dL (ref 3.6–5.1)
Alkaline phosphatase (APISO): 78 U/L (ref 35–144)
BUN: 17 mg/dL (ref 7–25)
CO2: 30 mmol/L (ref 20–32)
Calcium: 9.8 mg/dL (ref 8.6–10.3)
Chloride: 104 mmol/L (ref 98–110)
Creat: 0.8 mg/dL (ref 0.70–1.18)
GFR, Est African American: 105 mL/min/{1.73_m2} (ref >=60)
GFR, Est Non African American: 91 mL/min/{1.73_m2} (ref >=60)
Globulin: 2.2 g/dL (calc) (ref 1.9–3.7)
Glucose, Bld: 269 mg/dL — ABNORMAL HIGH (ref 65–99)
Potassium: 5.1 mmol/L (ref 3.5–5.3)
Sodium: 140 mmol/L (ref 135–146)
Total Bilirubin: 0.5 mg/dL (ref 0.2–1.2)
Total Protein: 6.3 g/dL (ref 6.1–8.1)

## 2019-04-25 LAB — CBC WITH DIFFERENTIAL/PLATELET
Absolute Monocytes: 832 cells/uL (ref 200–950)
Basophils Absolute: 53 cells/uL (ref 0–200)
Basophils Relative: 0.9 %
Eosinophils Absolute: 71 cells/uL (ref 15–500)
Eosinophils Relative: 1.2 %
HCT: 48.7 % (ref 38.5–50.0)
Hemoglobin: 16.4 g/dL (ref 13.2–17.1)
Lymphs Abs: 1670 cells/uL (ref 850–3900)
MCH: 32.3 pg (ref 27.0–33.0)
MCHC: 33.7 g/dL (ref 32.0–36.0)
MCV: 95.9 fL (ref 80.0–100.0)
MPV: 9.8 fL (ref 7.5–12.5)
Monocytes Relative: 14.1 %
Neutro Abs: 3275 cells/uL (ref 1500–7800)
Neutrophils Relative %: 55.5 %
Platelets: 181 10*3/uL (ref 140–400)
RBC: 5.08 10*6/uL (ref 4.20–5.80)
RDW: 12.6 % (ref 11.0–15.0)
Total Lymphocyte: 28.3 %
WBC: 5.9 10*3/uL (ref 3.8–10.8)

## 2019-04-25 NOTE — Progress Notes (Signed)
CBC is normal.  Glucose is elevated.  Please notify patient and forward labs to his PCP.

## 2019-04-27 DIAGNOSIS — Z0000 Encounter for general adult medical examination without abnormal findings: Secondary | ICD-10-CM | POA: Diagnosis not present

## 2019-04-27 DIAGNOSIS — M150 Primary generalized (osteo)arthritis: Secondary | ICD-10-CM | POA: Diagnosis not present

## 2019-04-27 DIAGNOSIS — Z23 Encounter for immunization: Secondary | ICD-10-CM | POA: Diagnosis not present

## 2019-04-27 DIAGNOSIS — E785 Hyperlipidemia, unspecified: Secondary | ICD-10-CM | POA: Diagnosis not present

## 2019-04-27 DIAGNOSIS — E119 Type 2 diabetes mellitus without complications: Secondary | ICD-10-CM | POA: Diagnosis not present

## 2019-04-27 DIAGNOSIS — Z125 Encounter for screening for malignant neoplasm of prostate: Secondary | ICD-10-CM | POA: Diagnosis not present

## 2019-04-27 DIAGNOSIS — I2510 Atherosclerotic heart disease of native coronary artery without angina pectoris: Secondary | ICD-10-CM | POA: Diagnosis not present

## 2019-04-27 DIAGNOSIS — I10 Essential (primary) hypertension: Secondary | ICD-10-CM | POA: Diagnosis not present

## 2019-04-27 DIAGNOSIS — N400 Enlarged prostate without lower urinary tract symptoms: Secondary | ICD-10-CM | POA: Diagnosis not present

## 2019-04-27 DIAGNOSIS — Z8673 Personal history of transient ischemic attack (TIA), and cerebral infarction without residual deficits: Secondary | ICD-10-CM | POA: Diagnosis not present

## 2019-05-08 NOTE — Progress Notes (Signed)
Office Visit Note  Patient: Alan Wang             Date of Birth: 26-Apr-1950           MRN: [id number]             PCP: Aura Dials, MD Referring: Aura Dials, MD Visit Date: 05/15/2019 Occupation: @GUAROCC @  Subjective:  Left knee joint pain   History of Present Illness: Alan Wang is a 69 y.o. male with history of inflammatory arthritis and osteoarthritis.  He is taking sulfasalazine 1,000 mg BID.  He tolerating SSZ without any side effects. He presents today with severe left knee joint pain.  He states his left knee has been popping and he is having difficulty with ambulation.  He states he had arthroscopic surgery in the past.  .  He states he is concerned because he has upcoming jury duty and will be unable to walk from the parking garage to the court house.  He is also concerned about the stiffness and discomfort of his left knee after sitting for long periods of time and does not think he will be able to go to jury duty. He is not having any other joint pain or joint swelling at this time.   Activities of Daily Living:  Patient reports morning stiffness for 0 none.   Patient Denies nocturnal pain.  Difficulty dressing/grooming: Denies Difficulty climbing stairs: Reports Difficulty getting out of chair: Reports Difficulty using hands for taps, buttons, cutlery, and/or writing: Reports  Review of Systems  Constitutional: Positive for fatigue. Negative for night sweats.  HENT: Positive for mouth dryness. Negative for mouth sores and nose dryness.   Eyes: Negative for redness and dryness.  Respiratory: Negative for cough, hemoptysis, shortness of breath and difficulty breathing.   Cardiovascular: Negative for chest pain, palpitations, hypertension, irregular heartbeat and swelling in legs/feet.  Gastrointestinal: Negative for constipation and diarrhea.  Genitourinary: Negative for difficulty urinating and painful urination.  Musculoskeletal: Positive for  arthralgias, joint pain and joint swelling. Negative for myalgias, muscle weakness, morning stiffness, muscle tenderness and myalgias.  Skin: Negative for color change, rash, hair loss, nodules/bumps, skin tightness, ulcers and sensitivity to sunlight.  Allergic/Immunologic: Negative for susceptible to infections.  Neurological: Negative for dizziness, fainting, numbness, memory loss, night sweats and weakness.  Hematological: Negative for swollen glands.  Psychiatric/Behavioral: Negative for depressed mood and sleep disturbance. The patient is not nervous/anxious.     PMFS History:  Patient Active Problem List   Diagnosis Date Noted  . TIA (transient ischemic attack) 06/22/2016  . Type 2 diabetes mellitus without complication ([place]) 123456  . Primary osteoarthritis of both hands 06/03/2016  . Primary osteoarthritis of both knees 06/03/2016  . Generalized pain 06/03/2016  . Memory difficulties 06/03/2016  . History of diabetes mellitus 06/03/2016  . History of coronary artery disease 06/03/2016  . History of hypertension 06/03/2016  . History of hypercholesterolemia 06/03/2016  . High risk medication use 06/03/2016  . History of high cholesterol 06/03/2016  . ANA positive 06/03/2016  . Inflammatory arthritis 10/29/2015  . Vitamin D deficiency 10/29/2015  . Fatigue 09/24/2015  . MGUS (monoclonal gammopathy of unknown significance) 09/22/2015  . Vertigo 10/31/2013  . Chronic cough 11/16/2012  . CAD (coronary artery disease), native coronary artery 03/08/2011  . Postsurgical percutaneous transluminal coronary angioplasty (PTCA) status 03/08/2011  . Type II or unspecified type diabetes mellitus with other coma, not stated as uncontrolled 03/08/2011  . Hypertension associated with diabetes ([place]) 03/08/2011  .  Shortness of breath 03/08/2011  . Mixed hyperlipidemia 03/08/2011    Past Medical History:  Diagnosis Date  . Arthritis   . Asthma   . Coronary artery disease   .  Diabetes mellitus    type 2, insulin dependent for 30+ years  . History of kidney stones 11/2012  . Hyperlipemia   . Hypertension   . IDDM (insulin dependent diabetes mellitus)   . Shortness of breath   . Stroke ([place])    hx TIA    Family History  Problem Relation Age of Onset  . Emphysema Mother   . COPD Mother   . Alzheimer's disease Father   . Asthma Paternal Aunt   . Heart attack Paternal Grandfather    Past Surgical History:  Procedure Laterality Date  . ANGIOPLASTY  04/06/2011   x3   . CARDIAC CATHETERIZATION    . CARDIAC CATHETERIZATION N/A 09/16/2015   Procedure: Left Heart Cath and Coronary Angiography;  Surgeon: Adrian Prows, MD;  Location: [facility];  Service: Cardiovascular;  Laterality: N/A;  . cataracts    . CHOLECYSTECTOMY    . HERNIA REPAIR     x2  . KNEE ARTHROPLASTY    . LEFT HEART CATHETERIZATION WITH CORONARY ANGIOGRAM N/A 03/08/2011   Procedure: LEFT HEART CATHETERIZATION WITH CORONARY ANGIOGRAM;  Surgeon: Laverda Page, MD;  Location: [facility];  Service: Cardiovascular;  Laterality: N/A;  . PERCUTANEOUS CORONARY STENT INTERVENTION (PCI-S) N/A 04/06/2011   Procedure: PERCUTANEOUS CORONARY STENT INTERVENTION (PCI-S);  Surgeon: Laverda Page, MD;  Location: [facility] CATH LAB;  Service: Cardiovascular;  Laterality: N/A;   Social History   Social History Narrative   Patient lives at home with his spouse.   Caffeine Use: occasionally   Immunization History  Administered Date(s) Administered  . PFIZER SARS-COV-2 Vaccination 04/02/2019, 04/23/2019  . Pneumococcal Polysaccharide-23 12/14/2011     Objective: Vital Signs: BP 127/60 (BP Location: Left Arm, Patient Position: Sitting, Cuff Size: Normal)   Pulse 75   Resp 16   Ht 5\' 10"  (1.778 m)   Wt 212 lb 8 oz (96.4 kg)   BMI 30.49 kg/m    Physical Exam Vitals and nursing note reviewed.  Constitutional:      Appearance: He is well-developed.  HENT:     Head: Normocephalic and atraumatic.    Eyes:     Conjunctiva/sclera: Conjunctivae normal.     Pupils: Pupils are equal, round, and reactive to light.  Pulmonary:     Effort: Pulmonary effort is normal.  Abdominal:     General: Bowel sounds are normal.     Palpations: Abdomen is soft.  Musculoskeletal:     Cervical back: Normal range of motion and neck supple.  Skin:    General: Skin is warm and dry.     Capillary Refill: Capillary refill takes less than 2 seconds.  Neurological:     Mental Status: He is alert and oriented to person, place, and time.  Psychiatric:        Behavior: Behavior normal.      Musculoskeletal Exam: C-spine good ROM.  Thoracic kyphosis noted. Shoulder joints, elbow joints, wrist joints, MCPs, PIPs, and DIPs good ROM with no synovitis. Ulnar deviation bilaterally.  PIP and DIP thickening consistent with osteoarthritis of both hands.  Left knee joint limited extension. No warmth or effusion of the left knee joint.  Right knee has good ROM with no warmth or effusion.  Ankle joints good ROM with no warmth or tenderness.  CDAI Exam: CDAI Score: -- Patient Global: --; Provider Global: -- Swollen: --; Tender: -- Joint Exam 05/15/2019   No joint exam has been documented for this visit   There is currently no information documented on the homunculus. Go to the Rheumatology activity and complete the homunculus joint exam.  Investigation: No additional findings.  Imaging: XR KNEE 3 VIEW LEFT  Result Date: 05/15/2019 Moderate lateral compartment narrowing with lateral osteophytes was noted.  Moderate patellofemoral narrowing was noted.  No chondrocalcinosis was noted. Impression: These findings are consistent with moderate osteoarthritis and moderate chondromalacia patella.   Recent Labs: Lab Results  Component Value Date   WBC 5.9 04/24/2019   HGB 16.4 04/24/2019   PLT 181 04/24/2019   NA 140 04/24/2019   K 5.1 04/24/2019   CL 104 04/24/2019   CO2 30 04/24/2019   GLUCOSE 269 (H)  04/24/2019   BUN 17 04/24/2019   CREATININE 0.80 04/24/2019   BILITOT 0.5 04/24/2019   ALKPHOS 76 10/26/2016   AST 17 04/24/2019   ALT 17 04/24/2019   PROT 6.3 04/24/2019   ALBUMIN 4.2 10/26/2016   CALCIUM 9.8 04/24/2019   GFRAA 105 04/24/2019    Speciality Comments: No specialty comments available.  Procedures:  No procedures performed Allergies: Avandia [rosiglitazone maleate]   Assessment / Plan:     Visit Diagnoses: Inflammatory arthritis: He has no synovitis on exam today.  He has not had any recent inflammatory otitis flares.  He is clinically doing well on sulfasalazine 1000 mg twice daily.  He has been experiencing increased discomfort in the left knee joint.  He has limited extension of the left knee on exam.  No warmth or effusion was noted.  X-rays of the left knee joint were updated today.  He has difficulty ambulating more than 200 yards as well as increase stiffness and discomfort after sitting for prolonged periods of time.  He has no other joint pain or joint swelling at this time.  He will continue taking sulfasalazine as prescribed.  He is advised to notify us if he develops increased joint pain or joint swelling.  No follow-up in the office in 6 months.  High risk medication use - Sulfasalazine 1000 mg twice daily.  CBC and CMP were drawn on 04/24/2019.  He will return for lab work in May and every 3 months.  Standing orders are in place.  Primary osteoarthritis of both hands: He has PIP and DIP thickening consistent with osteoarthritis of both hands.  Ulnar deviation was noted.  He has no synovitis on exam.  Joint protection and muscle strengthening were discussed  Primary osteoarthritis of both knees: He presents today with increased left knee joint pain.  He has limited extension of the left knee but no warmth or effusion was noted on exam.  He has been having difficulty ambulating more than 200 feet.  He is also had increased discomfort and stiffness after sitting for  prolonged periods of time.  He has upcoming jury duty but will be unable to participate due to his inflammatory arthritis and underlying osteoarthritis.  A note was provided to the patient to submit to excuse him from jury duty.  Chronic pain of left knee -He presents today with increased left knee joint pain.  He has limited extension on exam.  No warmth or effusion was noted.  He has been having increased difficulty ambulating as well as nocturnal pain.  He also has difficulty rising from a seated position and climbing steps.  X-rays of the left knee joint were obtained today.  An excuse was also provided for him to avoid going to jury duty due to his underlying inflammatory and osteoarthritis.  Plan: XR KNEE 3 VIEW LEFT  Medication monitoring encounter -UDS and narcotic agreement were updated today on 05/15/2019.  Plan: Pain Mgmt, Profile 5 w/Conf, U, Pain Mgmt, Tramadol w/medMATCH, U  Other chronic pain - tramadol 50 mg 1 tablet by mouth at bedtime as needed for pain relief.UDS and narcotic agreement were updated today on 05/15/2019.  History of vitamin D deficiency: He is taking vitamin D 2000 units daily.  Other medical conditions are listed as follows:   MGUS (monoclonal gammopathy of unknown significance)  History of hypercholesterolemia  History of hypertension  History of arthroscopy of left knee  History of coronary artery disease  History of TIA (transient ischemic attack)  History of diabetes mellitus    Orders: Orders Placed This Encounter  Procedures  . XR KNEE 3 VIEW LEFT  . Pain Mgmt, Profile 5 w/Conf, U  . Pain Mgmt, Tramadol w/medMATCH, U   No orders of the defined types were placed in this encounter.   Face-to-face time spent with patient was 30 minutes. Greater than 50% of time was spent in counseling and coordination of care.  Follow-Up Instructions: Return in 6 months (on 11/15/2019) for Inflammatory arthrits , Osteoarthritis.   Ofilia Neas, PA-C   I  examined and evaluated the patient with Hazel Sams PA.  He has been having increased pain and discomfort in his left knee joint.  The x-ray today showed moderate lateral compartment narrowing.  He has done well home discussed abdomen injections in the past.  He is not ready to have repeat Visco supplement injections.  Has been having difficulty walking more than 200 feet.  He requested a jury duty excuse which was filled today.  He had no synovitis on examination today.  He will continue sulfasalazine.  The plan of care was discussed as noted above.  Bo Merino, MD  Note - This record has been created using Editor, commissioning.  Chart creation errors have been sought, but may not always  have been located. Such creation errors do not reflect on  the standard of medical care.

## 2019-05-15 ENCOUNTER — Ambulatory Visit (INDEPENDENT_AMBULATORY_CARE_PROVIDER_SITE_OTHER): Payer: Medicare Other | Admitting: Rheumatology

## 2019-05-15 ENCOUNTER — Other Ambulatory Visit: Payer: Self-pay

## 2019-05-15 ENCOUNTER — Ambulatory Visit: Payer: Self-pay

## 2019-05-15 ENCOUNTER — Encounter: Payer: Self-pay | Admitting: Rheumatology

## 2019-05-15 VITALS — BP 127/60 | HR 75 | Resp 16 | Ht 70.0 in | Wt 212.5 lb

## 2019-05-15 DIAGNOSIS — Z9889 Other specified postprocedural states: Secondary | ICD-10-CM | POA: Diagnosis not present

## 2019-05-15 DIAGNOSIS — Z8679 Personal history of other diseases of the circulatory system: Secondary | ICD-10-CM

## 2019-05-15 DIAGNOSIS — Z8673 Personal history of transient ischemic attack (TIA), and cerebral infarction without residual deficits: Secondary | ICD-10-CM

## 2019-05-15 DIAGNOSIS — M25562 Pain in left knee: Secondary | ICD-10-CM | POA: Diagnosis not present

## 2019-05-15 DIAGNOSIS — M19042 Primary osteoarthritis, left hand: Secondary | ICD-10-CM

## 2019-05-15 DIAGNOSIS — G8929 Other chronic pain: Secondary | ICD-10-CM | POA: Diagnosis not present

## 2019-05-15 DIAGNOSIS — M1990 Unspecified osteoarthritis, unspecified site: Secondary | ICD-10-CM

## 2019-05-15 DIAGNOSIS — Z5181 Encounter for therapeutic drug level monitoring: Secondary | ICD-10-CM

## 2019-05-15 DIAGNOSIS — Z79899 Other long term (current) drug therapy: Secondary | ICD-10-CM

## 2019-05-15 DIAGNOSIS — M19041 Primary osteoarthritis, right hand: Secondary | ICD-10-CM | POA: Diagnosis not present

## 2019-05-15 DIAGNOSIS — D472 Monoclonal gammopathy: Secondary | ICD-10-CM

## 2019-05-15 DIAGNOSIS — Z8639 Personal history of other endocrine, nutritional and metabolic disease: Secondary | ICD-10-CM | POA: Diagnosis not present

## 2019-05-15 DIAGNOSIS — M170 Bilateral primary osteoarthritis of knee: Secondary | ICD-10-CM | POA: Diagnosis not present

## 2019-05-15 NOTE — Patient Instructions (Addendum)
Standing Labs We placed an order today for your standing lab work.    Please come back and get your standing labs in May and every 3 months   We have open lab daily Monday through Thursday from 8:30-12:30 PM and 1:30-4:30 PM and Friday from 8:30-12:30 PM and 1:30-4:00 PM at the office of Dr. Bo Merino.   You may experience shorter wait times on Monday and Friday afternoons. The office is located at [address], [place], [place], [place] [address] No appointment is necessary.   Labs are drawn by [facility].  You may receive a bill from [facility] for your lab work.  If you wish to have your labs drawn at another location, please call the office 24 hours in advance to send orders.  If you have any questions regarding directions or hours of operation,  please call [phone].   Just as a reminder please drink plenty of water prior to coming for your lab work. Thanks!    Journal for Nurse Practitioners, 15(4), [phone]. Retrieved December 12, 2017 from http://clinicalkey.com/nursing">  Knee Exercises Ask your health care provider which exercises are safe for you. Do exercises exactly as told by your health care provider and adjust them as directed. It is normal to feel mild stretching, pulling, tightness, or discomfort as you do these exercises. Stop right away if you feel sudden pain or your pain gets worse. Do not begin these exercises until told by your health care provider. Stretching and range-of-motion exercises These exercises warm up your muscles and joints and improve the movement and flexibility of your knee. These exercises also help to relieve pain and swelling. Knee extension, prone 1. Lie on your abdomen (prone position) on a bed. 2. Place your left / right knee just beyond the edge of the surface so your knee is not on the bed. You can put a towel under your left / right thigh just above your kneecap for comfort. 3. Relax your leg muscles and allow gravity to straighten  your knee (extension). You should feel a stretch behind your left / right knee. 4. Hold this position for __________ seconds. 5. Scoot up so your knee is supported between repetitions. Repeat __________ times. Complete this exercise __________ times a day. Knee flexion, active  1. Lie on your back with both legs straight. If this causes back discomfort, bend your left / right knee so your foot is flat on the floor. 2. Slowly slide your left / right heel back toward your buttocks. Stop when you feel a gentle stretch in the front of your knee or thigh (flexion). 3. Hold this position for __________ seconds. 4. Slowly slide your left / right heel back to the starting position. Repeat __________ times. Complete this exercise __________ times a day. Quadriceps stretch, prone  1. Lie on your abdomen on a firm surface, such as a bed or padded floor. 2. Bend your left / right knee and hold your ankle. If you cannot reach your ankle or pant leg, loop a belt around your foot and grab the belt instead. 3. Gently pull your heel toward your buttocks. Your knee should not slide out to the side. You should feel a stretch in the front of your thigh and knee (quadriceps). 4. Hold this position for __________ seconds. Repeat __________ times. Complete this exercise __________ times a day. Hamstring, supine 1. Lie on your back (supine position). 2. Loop a belt or towel over the ball of your left / right foot. The  ball of your foot is on the walking surface, right under your toes. 3. Straighten your left / right knee and slowly pull on the belt to raise your leg until you feel a gentle stretch behind your knee (hamstring). ? Do not let your knee bend while you do this. ? Keep your other leg flat on the floor. 4. Hold this position for __________ seconds. Repeat __________ times. Complete this exercise __________ times a day. Strengthening exercises These exercises build strength and endurance in your knee.  Endurance is the ability to use your muscles for a long time, even after they get tired. Quadriceps, isometric This exercise stretches the muscles in front of your thigh (quadriceps) without moving your knee joint (isometric). 1. Lie on your back with your left / right leg extended and your other knee bent. Put a rolled towel or small pillow under your knee if told by your health care provider. 2. Slowly tense the muscles in the front of your left / right thigh. You should see your kneecap slide up toward your hip or see increased dimpling just above the knee. This motion will push the back of the knee toward the floor. 3. For __________ seconds, hold the muscle as tight as you can without increasing your pain. 4. Relax the muscles slowly and completely. Repeat __________ times. Complete this exercise __________ times a day. Straight leg raises This exercise stretches the muscles in front of your thigh (quadriceps) and the muscles that move your hips (hip flexors). 1. Lie on your back with your left / right leg extended and your other knee bent. 2. Tense the muscles in the front of your left / right thigh. You should see your kneecap slide up or see increased dimpling just above the knee. Your thigh may even shake a bit. 3. Keep these muscles tight as you raise your leg 4-6 inches (10-15 cm) off the floor. Do not let your knee bend. 4. Hold this position for __________ seconds. 5. Keep these muscles tense as you lower your leg. 6. Relax your muscles slowly and completely after each repetition. Repeat __________ times. Complete this exercise __________ times a day. Hamstring, isometric 1. Lie on your back on a firm surface. 2. Bend your left / right knee about __________ degrees. 3. Dig your left / right heel into the surface as if you are trying to pull it toward your buttocks. Tighten the muscles in the back of your thighs (hamstring) to "dig" as hard as you can without increasing any  pain. 4. Hold this position for __________ seconds. 5. Release the tension gradually and allow your muscles to relax completely for __________ seconds after each repetition. Repeat __________ times. Complete this exercise __________ times a day. Hamstring curls If told by your health care provider, do this exercise while wearing ankle weights. Begin with __________ lb weights. Then increase the weight by 1 lb (0.5 kg) increments. Do not wear ankle weights that are more than __________ lb. 1. Lie on your abdomen with your legs straight. 2. Bend your left / right knee as far as you can without feeling pain. Keep your hips flat against the floor. 3. Hold this position for __________ seconds. 4. Slowly lower your leg to the starting position. Repeat __________ times. Complete this exercise __________ times a day. Squats This exercise strengthens the muscles in front of your thigh and knee (quadriceps). 1. Stand in front of a table, with your feet and knees pointing straight ahead. You may  rest your hands on the table for balance but not for support. 2. Slowly bend your knees and lower your hips like you are going to sit in a chair. ? Keep your weight over your heels, not over your toes. ? Keep your lower legs upright so they are parallel with the table legs. ? Do not let your hips go lower than your knees. ? Do not bend lower than told by your health care provider. ? If your knee pain increases, do not bend as low. 3. Hold the squat position for __________ seconds. 4. Slowly push with your legs to return to standing. Do not use your hands to pull yourself to standing. Repeat __________ times. Complete this exercise __________ times a day. Wall slides This exercise strengthens the muscles in front of your thigh and knee (quadriceps). 1. Lean your back against a smooth wall or door, and walk your feet out 18-24 inches (46-61 cm) from it. 2. Place your feet hip-width apart. 3. Slowly slide down  the wall or door until your knees bend __________ degrees. Keep your knees over your heels, not over your toes. Keep your knees in line with your hips. 4. Hold this position for __________ seconds. Repeat __________ times. Complete this exercise __________ times a day. Straight leg raises This exercise strengthens the muscles that rotate the leg at the hip and move it away from your body (hip abductors). 1. Lie on your side with your left / right leg in the top position. Lie so your head, shoulder, knee, and hip line up. You may bend your bottom knee to help you keep your balance. 2. Roll your hips slightly forward so your hips are stacked directly over each other and your left / right knee is facing forward. 3. Leading with your heel, lift your top leg 4-6 inches (10-15 cm). You should feel the muscles in your outer hip lifting. ? Do not let your foot drift forward. ? Do not let your knee roll toward the ceiling. 4. Hold this position for __________ seconds. 5. Slowly return your leg to the starting position. 6. Let your muscles relax completely after each repetition. Repeat __________ times. Complete this exercise __________ times a day. Straight leg raises This exercise stretches the muscles that move your hips away from the front of the pelvis (hip extensors). 1. Lie on your abdomen on a firm surface. You can put a pillow under your hips if that is more comfortable. 2. Tense the muscles in your buttocks and lift your left / right leg about 4-6 inches (10-15 cm). Keep your knee straight as you lift your leg. 3. Hold this position for __________ seconds. 4. Slowly lower your leg to the starting position. 5. Let your leg relax completely after each repetition. Repeat __________ times. Complete this exercise __________ times a day. This information is not intended to replace advice given to you by your health care provider. Make sure you discuss any questions you have with your health care  provider. Document Revised: 12/13/2017 Document Reviewed: 12/13/2017 Elsevier Patient Education  2020 [employer].

## 2019-05-17 LAB — PAIN MGMT, PROFILE 5 W/CONF, U
Amphetamines: NEGATIVE ng/mL
Barbiturates: NEGATIVE ng/mL
Benzodiazepines: NEGATIVE ng/mL
Cocaine Metabolite: NEGATIVE ng/mL
Creatinine: 52.8 mg/dL
Marijuana Metabolite: NEGATIVE ng/mL
Methadone Metabolite: NEGATIVE ng/mL
Opiates: NEGATIVE ng/mL
Oxidant: NEGATIVE ug/mL
Oxycodone: NEGATIVE ng/mL
pH: 5.2 (ref 4.5–9.0)

## 2019-05-17 LAB — PAIN MGMT, TRAMADOL W/MEDMATCH, U
Desmethyltramadol: NEGATIVE ng/mL
Tramadol: NEGATIVE ng/mL

## 2019-05-17 NOTE — Progress Notes (Signed)
UDS is negative for tramadol.  He takes tramadol very sparingly.

## 2019-06-07 DIAGNOSIS — E7800 Pure hypercholesterolemia, unspecified: Secondary | ICD-10-CM | POA: Diagnosis not present

## 2019-06-07 DIAGNOSIS — E1165 Type 2 diabetes mellitus with hyperglycemia: Secondary | ICD-10-CM | POA: Diagnosis not present

## 2019-06-14 DIAGNOSIS — E11319 Type 2 diabetes mellitus with unspecified diabetic retinopathy without macular edema: Secondary | ICD-10-CM | POA: Diagnosis not present

## 2019-06-14 DIAGNOSIS — I10 Essential (primary) hypertension: Secondary | ICD-10-CM | POA: Diagnosis not present

## 2019-06-14 DIAGNOSIS — E7800 Pure hypercholesterolemia, unspecified: Secondary | ICD-10-CM | POA: Diagnosis not present

## 2019-06-14 DIAGNOSIS — G609 Hereditary and idiopathic neuropathy, unspecified: Secondary | ICD-10-CM | POA: Diagnosis not present

## 2019-06-14 DIAGNOSIS — E1165 Type 2 diabetes mellitus with hyperglycemia: Secondary | ICD-10-CM | POA: Diagnosis not present

## 2019-06-26 ENCOUNTER — Other Ambulatory Visit: Payer: Self-pay

## 2019-06-26 MED ORDER — OLMESARTAN MEDOXOMIL 20 MG PO TABS: 20.0000 mg | ORAL_TABLET | Freq: Every day | ORAL | 1 refills | Status: DC

## 2019-06-26 MED ORDER — OLMESARTAN MEDOXOMIL 20 MG PO TABS: 20.0000 mg | ORAL_TABLET | Freq: Every day | ORAL | 0 refills | Status: DC

## 2019-07-13 ENCOUNTER — Other Ambulatory Visit: Payer: Self-pay | Admitting: Cardiology

## 2019-07-16 ENCOUNTER — Other Ambulatory Visit: Payer: Self-pay | Admitting: Rheumatology

## 2019-07-16 NOTE — Telephone Encounter (Signed)
Last Visit: 05/15/2019 Next Visit: 11/15/2019 Labs: 04/24/2019  CBC is normal. Glucose is elevated.  Okay to refill per Dr. Estanislado Pandy.

## 2019-07-31 ENCOUNTER — Other Ambulatory Visit: Payer: Self-pay | Admitting: Cardiology

## 2019-07-31 DIAGNOSIS — G459 Transient cerebral ischemic attack, unspecified: Secondary | ICD-10-CM

## 2019-08-02 DIAGNOSIS — L308 Other specified dermatitis: Secondary | ICD-10-CM | POA: Diagnosis not present

## 2019-08-02 DIAGNOSIS — D485 Neoplasm of uncertain behavior of skin: Secondary | ICD-10-CM | POA: Diagnosis not present

## 2019-08-02 DIAGNOSIS — L570 Actinic keratosis: Secondary | ICD-10-CM | POA: Diagnosis not present

## 2019-08-02 DIAGNOSIS — D0462 Carcinoma in situ of skin of left upper limb, including shoulder: Secondary | ICD-10-CM | POA: Diagnosis not present

## 2019-08-02 DIAGNOSIS — L720 Epidermal cyst: Secondary | ICD-10-CM | POA: Diagnosis not present

## 2019-08-03 ENCOUNTER — Telehealth: Payer: Self-pay | Admitting: Rheumatology

## 2019-08-03 MED ORDER — TRAMADOL HCL 50 MG PO TABS: 50.0000 mg | ORAL_TABLET | Freq: Every evening | ORAL | 0 refills | Status: DC | PRN

## 2019-08-03 NOTE — Telephone Encounter (Signed)
Patient left a voicemail requesting prescription refill of Tramadol 50 mg 3 month supply.  Patient states the last time it was refilled it was sent to [facility] or you can send it to [facility] local [facility] at [address] in [place].

## 2019-08-03 NOTE — Telephone Encounter (Signed)
Last Visit: 05/15/2019 Next Visit: 11/15/2019 UDS: 05/15/2019 Narc Agreement: 05/15/2019  Okay to refill Tramadol?

## 2019-08-08 ENCOUNTER — Other Ambulatory Visit: Payer: Self-pay | Admitting: *Deleted

## 2019-10-14 ENCOUNTER — Other Ambulatory Visit: Payer: Self-pay | Admitting: Rheumatology

## 2019-10-15 NOTE — Telephone Encounter (Signed)
He will require updated lab work prior to refilling SSZ.  He was due for lab work in May 2021.

## 2019-10-15 NOTE — Telephone Encounter (Signed)
Last Visit: 05/15/2019 Next Visit: 11/15/2019 Labs:04/24/2019  CBC is normal. Glucose is elevated.  Current Dose per office note on 05/15/2019: Sulfasalazine 1000 mg twice daily.  Attempted to contact the patient and left message to advise patient he is due for labs.  Okay to refill 30 day supply SSZ?

## 2019-10-26 ENCOUNTER — Other Ambulatory Visit: Payer: Self-pay

## 2019-10-26 DIAGNOSIS — Z79899 Other long term (current) drug therapy: Secondary | ICD-10-CM | POA: Diagnosis not present

## 2019-10-26 LAB — CBC WITH DIFFERENTIAL/PLATELET
Absolute Monocytes: 1142 cells/uL — ABNORMAL HIGH (ref 200–950)
Basophils Absolute: 34 cells/uL (ref 0–200)
Basophils Relative: 0.4 %
Eosinophils Absolute: 92 cells/uL (ref 15–500)
Eosinophils Relative: 1.1 %
HCT: 51.1 % — ABNORMAL HIGH (ref 38.5–50.0)
Hemoglobin: 17.1 g/dL (ref 13.2–17.1)
Lymphs Abs: 2999 cells/uL (ref 850–3900)
MCH: 30.9 pg (ref 27.0–33.0)
MCHC: 33.5 g/dL (ref 32.0–36.0)
MCV: 92.4 fL (ref 80.0–100.0)
MPV: 9.5 fL (ref 7.5–12.5)
Monocytes Relative: 13.6 %
Neutro Abs: 4133 cells/uL (ref 1500–7800)
Neutrophils Relative %: 49.2 %
Platelets: 197 10*3/uL (ref 140–400)
RBC: 5.53 10*6/uL (ref 4.20–5.80)
RDW: 13.9 % (ref 11.0–15.0)
Total Lymphocyte: 35.7 %
WBC: 8.4 10*3/uL (ref 3.8–10.8)

## 2019-10-26 LAB — COMPLETE METABOLIC PANEL WITH GFR
AG Ratio: 1.8 (calc) (ref 1.0–2.5)
ALT: 26 U/L (ref 9–46)
AST: 26 U/L (ref 10–35)
Albumin: 4.4 g/dL (ref 3.6–5.1)
Alkaline phosphatase (APISO): 88 U/L (ref 35–144)
BUN: 16 mg/dL (ref 7–25)
CO2: 27 mmol/L (ref 20–32)
Calcium: 9.6 mg/dL (ref 8.6–10.3)
Chloride: 103 mmol/L (ref 98–110)
Creat: 0.84 mg/dL (ref 0.70–1.18)
GFR, Est African American: 103 mL/min/{1.73_m2} (ref >=60)
GFR, Est Non African American: 89 mL/min/{1.73_m2} (ref >=60)
Globulin: 2.4 g/dL (calc) (ref 1.9–3.7)
Glucose, Bld: 113 mg/dL — ABNORMAL HIGH (ref 65–99)
Potassium: 4.9 mmol/L (ref 3.5–5.3)
Sodium: 141 mmol/L (ref 135–146)
Total Bilirubin: 0.4 mg/dL (ref 0.2–1.2)
Total Protein: 6.8 g/dL (ref 6.1–8.1)

## 2019-10-29 NOTE — Progress Notes (Signed)
CBC and CMP are stable.

## 2019-11-02 NOTE — Progress Notes (Signed)
Office Visit Note  Patient: Alan Wang             Date of Birth: [birth year]           MRN: [id number]             PCP: Aura Dials, MD Referring: Aura Dials, MD Visit Date: 11/15/2019 Occupation: @GUAROCC @  Subjective:  Medication monitoring.   History of Present Illness: Alan Wang is a 69 y.o. male with history of inflammatory arthritis and osteoarthritis.  He states he has been doing quite well on sulfasalazine.  He denies any history of joint swelling.  He continues to have some joint discomfort off and on.  He states he has noticed some deconditioning over the last 2 years with COVID-19.  He has some difficulty getting up from the squatting position.  Activities of Daily Living:  Patient reports morning stiffness for 0 minutes.   Patient Reports nocturnal pain.  Difficulty dressing/grooming: Denies Difficulty climbing stairs: Reports Difficulty getting out of chair: Reports Difficulty using hands for taps, buttons, cutlery, and/or writing: Denies  Review of Systems  Constitutional: Positive for fatigue.  HENT: Positive for mouth dryness. Negative for mouth sores and nose dryness.   Eyes: Negative for itching and dryness.  Respiratory: Negative for shortness of breath and difficulty breathing.   Cardiovascular: Negative for chest pain and palpitations.  Gastrointestinal: Positive for constipation. Negative for blood in stool and diarrhea.  Endocrine: Negative for increased urination.  Genitourinary: Negative for difficulty urinating.  Musculoskeletal: Positive for arthralgias, joint pain, myalgias, muscle tenderness and myalgias. Negative for joint swelling and morning stiffness.  Skin: Negative for color change, rash and redness.  Allergic/Immunologic: Negative for susceptible to infections.  Neurological: Positive for dizziness and weakness. Negative for numbness, headaches and memory loss.  Hematological: Positive for bruising/bleeding tendency.   Psychiatric/Behavioral: Negative for confusion.    PMFS History:  Patient Active Problem List   Diagnosis Date Noted  . TIA (transient ischemic attack) 06/22/2016  . Type 2 diabetes mellitus without complication ([place]) 20/25/4270  . Primary osteoarthritis of both hands 06/03/2016  . Primary osteoarthritis of both knees 06/03/2016  . Generalized pain 06/03/2016  . Memory difficulties 06/03/2016  . History of diabetes mellitus 06/03/2016  . History of coronary artery disease 06/03/2016  . History of hypertension 06/03/2016  . History of hypercholesterolemia 06/03/2016  . High risk medication use 06/03/2016  . History of high cholesterol 06/03/2016  . ANA positive 06/03/2016  . Inflammatory arthritis 10/29/2015  . Vitamin D deficiency 10/29/2015  . Fatigue 09/24/2015  . MGUS (monoclonal gammopathy of unknown significance) 09/22/2015  . Vertigo 10/31/2013  . Chronic cough 11/16/2012  . CAD (coronary artery disease), native coronary artery 03/08/2011  . Postsurgical percutaneous transluminal coronary angioplasty (PTCA) status 03/08/2011  . Type II or unspecified type diabetes mellitus with other coma, not stated as uncontrolled 03/08/2011  . Hypertension associated with diabetes ([place]) 03/08/2011  . Shortness of breath 03/08/2011  . Mixed hyperlipidemia 03/08/2011    Past Medical History:  Diagnosis Date  . Arthritis   . Asthma   . Coronary artery disease   . Diabetes mellitus    type 2, insulin dependent for 30+ years  . History of kidney stones 11/2012  . Hyperlipemia   . Hypertension   . IDDM (insulin dependent diabetes mellitus)   . Shortness of breath   . Stroke ([place])    hx TIA    Family History  Problem Relation Age of Onset  .  Emphysema Mother   . COPD Mother   . Alzheimer's disease Father   . Asthma Paternal Aunt   . Heart attack Paternal Grandfather    Past Surgical History:  Procedure Laterality Date  . ANGIOPLASTY  04/06/2011   x3   . CARDIAC  CATHETERIZATION    . CARDIAC CATHETERIZATION N/A 09/16/2015   Procedure: Left Heart Cath and Coronary Angiography;  Surgeon: Adrian Prows, MD;  Location: [facility];  Service: Cardiovascular;  Laterality: N/A;  . cataracts    . CHOLECYSTECTOMY    . HERNIA REPAIR     x2  . KNEE ARTHROSCOPY    . LEFT HEART CATHETERIZATION WITH CORONARY ANGIOGRAM N/A 03/08/2011   Procedure: LEFT HEART CATHETERIZATION WITH CORONARY ANGIOGRAM;  Surgeon: Laverda Page, MD;  Location[facility];  Service: Cardiovascular;  Laterality: N/A;  . PERCUTANEOUS CORONARY STENT INTERVENTION (PCI-S) N/A 04/06/2011   Procedure: PERCUTANEOUS CORONARY STENT INTERVENTION (PCI-S);  Surgeon: Laverda Page, MD;  Location: [facility] CATH LAB;  Service: Cardiovascular;  Laterality: N/A;   Social History   Social History Narrative   Patient lives at home with his spouse.   Caffeine Use: occasionally   Immunization History  Administered Date(s) Administered  . PFIZER SARS-COV-2 Vaccination 04/02/2019, 04/23/2019  . Pneumococcal Polysaccharide-23 12/14/2011     Objective: Vital Signs: BP (!) 143/89 (BP Location: Left Arm, Patient Position: Sitting, Cuff Size: Small)   Pulse 76   Resp 12   Ht 5\' 10"  (1.778 m)   Wt 216 lb 4.8 oz (98.1 kg)   BMI 31.04 kg/m    Physical Exam Vitals and nursing note reviewed.  Constitutional:      Appearance: He is well-developed.  HENT:     Head: Normocephalic and atraumatic.  Eyes:     Conjunctiva/sclera: Conjunctivae normal.     Pupils: Pupils are equal, round, and reactive to light.  Cardiovascular:     Rate and Rhythm: Normal rate and regular rhythm.     Heart sounds: Normal heart sounds.  Pulmonary:     Effort: Pulmonary effort is normal.     Breath sounds: Normal breath sounds.  Abdominal:     General: Bowel sounds are normal.     Palpations: Abdomen is soft.  Musculoskeletal:     Cervical back: Normal range of motion and neck supple.  Skin:    General: Skin is warm  and dry.     Capillary Refill: Capillary refill takes less than 2 seconds.  Neurological:     Mental Status: He is alert and oriented to person, place, and time.  Psychiatric:        Behavior: Behavior normal.      Musculoskeletal Exam: C-spine was in good range of motion.  Shoulder joints, elbow joints with good range of motion.  He has DIP and PIP thickening with no synovitis.  Hip joints and knee joints with good range of motion with no synovitis.  He had no tenderness over MTPs.  CDAI Exam: CDAI Score: 0.3  Patient Global: 2 mm; Provider Global: 1 mm Swollen: 0 ; Tender: 0  Joint Exam 11/15/2019   No joint exam has been documented for this visit   There is currently no information documented on the homunculus. Go to the Rheumatology activity and complete the homunculus joint exam.  Investigation: No additional findings.  Imaging: No results found.  Recent Labs: Lab Results  Component Value Date   WBC 8.4 10/26/2019   HGB 17.1 10/26/2019   PLT  197 10/26/2019   NA 141 10/26/2019   K 4.9 10/26/2019   CL 103 10/26/2019   CO2 27 10/26/2019   GLUCOSE 113 (H) 10/26/2019   BUN 16 10/26/2019   CREATININE 0.84 10/26/2019   BILITOT 0.4 10/26/2019   ALKPHOS 76 10/26/2016   AST 26 10/26/2019   ALT 26 10/26/2019   PROT 6.8 10/26/2019   ALBUMIN 4.2 10/26/2016   CALCIUM 9.6 10/26/2019   GFRAA 103 10/26/2019    Speciality Comments: No specialty comments available.  Procedures:  No procedures performed Allergies: Avandia [rosiglitazone maleate]   Assessment / Plan:     Visit Diagnoses: Inflammatory arthritis-patient had no synovitis on my examination.  He has been doing well on sulfasalazine.  High risk medication use - Sulfasalazine 1000 mg twice daily.  Labs have been stable.  We will continue to monitor labs every 3 months.  Primary osteoarthritis of both hands-joint protection was discussed.  Primary osteoarthritis of both knees-he has some discomfort in his knee  joints.  He states he has difficulty getting up from the squatting position.  He believes it is due to deconditioning.  Regular exercise was emphasized.  Other chronic pain -  tramadol 50 mg 1 tablet by mouth at bedtime as needed for pain relief.  He states he takes tramadol only occasionally for discomfort.  He was given only 7 tablets at the last refill.  Side effects of tramadol were discussed.  UDS & narcotic agreement: 05/15/2019  Medication monitoring encounter - Plan: DRUG MONITOR, TRAMADOL,QN, URINE, DRUG MONITOR, PANEL 5, W/CONF, URINE  History of vitamin D deficiency-he takes calcium rich diet and supplements.  Educated about COVID-19 virus infection-he has been fully vaccinated against COVID-19.  He was advised to get a booster.  Instructions were placed in the AVS.  Use of mask, social distancing and hand hygiene was emphasized.  I also discussed that he may be a candidate for monoclonal antibody infusion if he develops a COVID-19 infection.  Other medical problems are listed as follows:  History of hypertension  MGUS (monoclonal gammopathy of unknown significance)  History of hypercholesterolemia  History of coronary artery disease-weight loss diet and exercise was emphasized.  His BMI is 31.04.  History of arthroscopy of left knee  History of diabetes mellitus  History of TIA (transient ischemic attack)  Orders: Orders Placed This Encounter  Procedures  . DRUG MONITOR, TRAMADOL,QN, URINE  . DRUG MONITOR, PANEL 5, W/CONF, URINE   Meds ordered this encounter  Medications  . sulfaSALAzine (AZULFIDINE) 500 MG tablet    Sig: TAKE 2 TABLETS(1,000MG      TOTAL) TWICE A DAY    Dispense:  360 tablet    Refill:  0  . traMADol (ULTRAM) 50 MG tablet    Sig: Take 1 tablet (50 mg total) by mouth at bedtime as needed.    Dispense:  7 tablet    Refill:  0     Follow-Up Instructions: Return in about 5 months (around 04/16/2020) for Rheumatoid arthritis,  Osteoarthritis.   Bo Merino, MD  Note - This record has been created using Editor, commissioning.  Chart creation errors have been sought, but may not always  have been located. Such creation errors do not reflect on  the standard of medical care.

## 2019-11-15 ENCOUNTER — Encounter: Payer: Self-pay | Admitting: Rheumatology

## 2019-11-15 ENCOUNTER — Other Ambulatory Visit: Payer: Self-pay

## 2019-11-15 ENCOUNTER — Ambulatory Visit (INDEPENDENT_AMBULATORY_CARE_PROVIDER_SITE_OTHER): Payer: Medicare Other | Admitting: Rheumatology

## 2019-11-15 VITALS — BP 143/89 | HR 76 | Resp 12 | Ht 70.0 in | Wt 216.3 lb

## 2019-11-15 DIAGNOSIS — M170 Bilateral primary osteoarthritis of knee: Secondary | ICD-10-CM | POA: Diagnosis not present

## 2019-11-15 DIAGNOSIS — Z8679 Personal history of other diseases of the circulatory system: Secondary | ICD-10-CM | POA: Diagnosis not present

## 2019-11-15 DIAGNOSIS — Z79899 Other long term (current) drug therapy: Secondary | ICD-10-CM | POA: Diagnosis not present

## 2019-11-15 DIAGNOSIS — M1990 Unspecified osteoarthritis, unspecified site: Secondary | ICD-10-CM

## 2019-11-15 DIAGNOSIS — Z5181 Encounter for therapeutic drug level monitoring: Secondary | ICD-10-CM

## 2019-11-15 DIAGNOSIS — G8929 Other chronic pain: Secondary | ICD-10-CM | POA: Diagnosis not present

## 2019-11-15 DIAGNOSIS — M19041 Primary osteoarthritis, right hand: Secondary | ICD-10-CM | POA: Diagnosis not present

## 2019-11-15 DIAGNOSIS — D472 Monoclonal gammopathy: Secondary | ICD-10-CM

## 2019-11-15 DIAGNOSIS — Z8639 Personal history of other endocrine, nutritional and metabolic disease: Secondary | ICD-10-CM | POA: Diagnosis not present

## 2019-11-15 DIAGNOSIS — Z8673 Personal history of transient ischemic attack (TIA), and cerebral infarction without residual deficits: Secondary | ICD-10-CM | POA: Diagnosis not present

## 2019-11-15 DIAGNOSIS — Z9889 Other specified postprocedural states: Secondary | ICD-10-CM | POA: Diagnosis not present

## 2019-11-15 DIAGNOSIS — M19042 Primary osteoarthritis, left hand: Secondary | ICD-10-CM

## 2019-11-15 DIAGNOSIS — Z7189 Other specified counseling: Secondary | ICD-10-CM

## 2019-11-15 MED ORDER — SULFASALAZINE 500 MG PO TABS
ORAL_TABLET | ORAL | 0 refills | Status: DC
Start: 2019-11-15 — End: 2019-11-16

## 2019-11-15 MED ORDER — TRAMADOL HCL 50 MG PO TABS
50.0000 mg | ORAL_TABLET | Freq: Every evening | ORAL | 0 refills | Status: DC | PRN
Start: 2019-11-15 — End: 2020-04-02

## 2019-11-15 NOTE — Patient Instructions (Addendum)
COVID-19 vaccine recommendations:  ° °COVID-19 vaccine is recommended for everyone (unless you are allergic to a vaccine component), even if you are on a medication that suppresses your immune system.  ° °If you are on Methotrexate, Cellcept (mycophenolate), Rinvoq, Xeljanz, and Olumiant- hold the medication for 1 week after each vaccine. Hold Methotrexate for 2 weeks after the single dose COVID-19 vaccine.  ° °If you are on Orencia subcutaneous injection - hold medication one week prior to and one week after the first COVID-19 vaccine dose (only).  ° °If you are on Orencia IV infusions- time vaccination administration so that the first COVID-19 vaccination will occur four weeks after the infusion and postpone the subsequent infusion by one week.  ° °If you are on Cyclophosphamide or Rituxan infusions please contact your doctor prior to receiving the COVID-19 vaccine.  ° °Do not take Tylenol or any anti-inflammatory medications (NSAIDs) 24 hours prior to the COVID-19 vaccination.  ° °There is no direct evidence about the efficacy of the COVID-19 vaccine in individuals who are on medications that suppress the immune system.  ° °Even if you are fully vaccinated, and you are on any medications that suppress your immune system, please continue to wear a mask, maintain at least six feet social distance and practice hand hygiene.  ° °If you develop a COVID-19 infection, please contact your PCP or our office to determine if you need antibody infusion. ° °The booster vaccine is now available for immunocompromised patients. It is advised that if you had Pfizer vaccine you should get Pfizer booster.  If you had a Moderna vaccine then you should get a Moderna booster. Johnson and Johnson does not have a booster vaccine at this time. ° °Please see the following web sites for updated information.   ° °https://[email or web address].org/Portals/0/Files/COVID-19-Vaccination-Patient-Resources.pdf ° °[email or web address]://[email or web address]/About-Us/[email or web address]-Releases/ID/1159 ° °Standing Labs °We placed an order today for your standing lab work.  ° °Please have your standing labs drawn in November and every 3 months  ° °If possible, please have your labs drawn 2 weeks prior to your appointment so that the provider can discuss your results at your appointment. ° °We have open lab daily °Monday through Thursday from 8:30-12:30 PM and 1:30-4:30 PM and Friday from 8:30-12:30 PM and 1:30-4:00 PM °at the office of Dr. Dalayah Deahl, [facility].   °Please be advised, patients with office appointments requiring lab work will take precedents over walk-in lab work.  °If possible, please come for your lab work on Monday and Friday afternoons, as you may experience shorter wait times. °The office is located at [address], [address], [place], [place] [address] °No appointment is necessary.   °Labs are drawn by [facility]. Please bring your co-pay at the time of your lab draw.  You may receive a bill from [facility] for your lab work. ° °If you wish to have your labs drawn at another location, please call the office 24 hours in advance to send orders. ° °If you have any questions regarding directions or hours of operation,  °please call [phone].   °As a reminder, please drink plenty of water prior to coming for your lab work. Thanks! ° ° °

## 2019-11-16 ENCOUNTER — Other Ambulatory Visit: Payer: Self-pay | Admitting: Rheumatology

## 2019-11-16 MED ORDER — SULFASALAZINE 500 MG PO TABS
ORAL_TABLET | ORAL | 0 refills | Status: DC
Start: 2019-11-16 — End: 2020-04-01

## 2019-11-16 NOTE — Telephone Encounter (Signed)
Patient's wife Alan Wang called checking on Alan Wang's prescription refill of Sulfasalazine. Alan Wang was informed that the prescription was sent to [facility].  Alan Wang states that Alan Wang is completely out of medication and is asking if there is any way it could be resent to his local pharmacy.  Please advise.

## 2019-11-16 NOTE — Telephone Encounter (Signed)
Patient's wife advised prescription has been sent to local pharmacy.

## 2019-11-16 NOTE — Telephone Encounter (Signed)
Last Visit: 11/15/2019 Next Visit: 04/23/2020 Labs: 10/26/2019 CBC and CMP are stable.  Okay to send 30 day supply to local pharmacy for SSZ?

## 2019-11-18 LAB — DRUG MONITOR, PANEL 5, W/CONF, URINE
Amphetamines: NEGATIVE ng/mL (ref <500)
Barbiturates: NEGATIVE ng/mL (ref <300)
Benzodiazepines: NEGATIVE ng/mL (ref <100)
Cocaine Metabolite: NEGATIVE ng/mL (ref <150)
Creatinine: 69.8 mg/dL
Marijuana Metabolite: NEGATIVE ng/mL (ref <20)
Methadone Metabolite: NEGATIVE ng/mL (ref <100)
Opiates: NEGATIVE ng/mL (ref <100)
Oxidant: NEGATIVE ug/mL
Oxycodone: NEGATIVE ng/mL (ref <100)
pH: 5.1 (ref 4.5–9.0)

## 2019-11-18 LAB — DRUG MONITOR, TRAMADOL,QN, URINE
Desmethyltramadol: NEGATIVE ng/mL (ref <100)
Tramadol: NEGATIVE ng/mL (ref <100)

## 2019-11-18 LAB — DM TEMPLATE

## 2019-11-18 NOTE — Progress Notes (Signed)
UDS is negative.  Patient takes tramadol only occasionally.

## 2019-12-04 ENCOUNTER — Telehealth: Payer: Self-pay

## 2019-12-04 DIAGNOSIS — E119 Type 2 diabetes mellitus without complications: Secondary | ICD-10-CM | POA: Diagnosis not present

## 2019-12-04 DIAGNOSIS — I2510 Atherosclerotic heart disease of native coronary artery without angina pectoris: Secondary | ICD-10-CM | POA: Diagnosis not present

## 2019-12-04 DIAGNOSIS — M150 Primary generalized (osteo)arthritis: Secondary | ICD-10-CM | POA: Diagnosis not present

## 2019-12-04 DIAGNOSIS — I10 Essential (primary) hypertension: Secondary | ICD-10-CM | POA: Diagnosis not present

## 2019-12-04 DIAGNOSIS — Z23 Encounter for immunization: Secondary | ICD-10-CM | POA: Diagnosis not present

## 2019-12-04 DIAGNOSIS — E785 Hyperlipidemia, unspecified: Secondary | ICD-10-CM | POA: Diagnosis not present

## 2019-12-04 NOTE — Telephone Encounter (Signed)
Patient's wife Vaughan Basta left a voicemail to let Dr. Estanislado Pandy know that Tamel's PCP Dr. Sheryn Bison will now be prescribing his Tramadol.

## 2019-12-05 DIAGNOSIS — Z794 Long term (current) use of insulin: Secondary | ICD-10-CM | POA: Diagnosis not present

## 2019-12-05 DIAGNOSIS — E113212 Type 2 diabetes mellitus with mild nonproliferative diabetic retinopathy with macular edema, left eye: Secondary | ICD-10-CM | POA: Diagnosis not present

## 2019-12-05 DIAGNOSIS — E113291 Type 2 diabetes mellitus with mild nonproliferative diabetic retinopathy without macular edema, right eye: Secondary | ICD-10-CM | POA: Diagnosis not present

## 2019-12-06 DIAGNOSIS — E119 Type 2 diabetes mellitus without complications: Secondary | ICD-10-CM | POA: Diagnosis not present

## 2019-12-06 DIAGNOSIS — I252 Old myocardial infarction: Secondary | ICD-10-CM | POA: Diagnosis not present

## 2019-12-06 DIAGNOSIS — I10 Essential (primary) hypertension: Secondary | ICD-10-CM | POA: Diagnosis not present

## 2019-12-06 DIAGNOSIS — M19049 Primary osteoarthritis, unspecified hand: Secondary | ICD-10-CM | POA: Diagnosis not present

## 2019-12-06 DIAGNOSIS — N400 Enlarged prostate without lower urinary tract symptoms: Secondary | ICD-10-CM | POA: Diagnosis not present

## 2019-12-06 DIAGNOSIS — M179 Osteoarthritis of knee, unspecified: Secondary | ICD-10-CM | POA: Diagnosis not present

## 2019-12-06 DIAGNOSIS — I2510 Atherosclerotic heart disease of native coronary artery without angina pectoris: Secondary | ICD-10-CM | POA: Diagnosis not present

## 2019-12-06 DIAGNOSIS — N401 Enlarged prostate with lower urinary tract symptoms: Secondary | ICD-10-CM | POA: Diagnosis not present

## 2019-12-06 DIAGNOSIS — M150 Primary generalized (osteo)arthritis: Secondary | ICD-10-CM | POA: Diagnosis not present

## 2019-12-06 DIAGNOSIS — E785 Hyperlipidemia, unspecified: Secondary | ICD-10-CM | POA: Diagnosis not present

## 2019-12-17 ENCOUNTER — Other Ambulatory Visit: Payer: Self-pay | Admitting: Cardiology

## 2020-01-06 DIAGNOSIS — I2510 Atherosclerotic heart disease of native coronary artery without angina pectoris: Secondary | ICD-10-CM | POA: Diagnosis not present

## 2020-01-06 DIAGNOSIS — N400 Enlarged prostate without lower urinary tract symptoms: Secondary | ICD-10-CM | POA: Diagnosis not present

## 2020-01-06 DIAGNOSIS — M150 Primary generalized (osteo)arthritis: Secondary | ICD-10-CM | POA: Diagnosis not present

## 2020-01-06 DIAGNOSIS — M179 Osteoarthritis of knee, unspecified: Secondary | ICD-10-CM | POA: Diagnosis not present

## 2020-01-06 DIAGNOSIS — I10 Essential (primary) hypertension: Secondary | ICD-10-CM | POA: Diagnosis not present

## 2020-01-06 DIAGNOSIS — M19049 Primary osteoarthritis, unspecified hand: Secondary | ICD-10-CM | POA: Diagnosis not present

## 2020-01-06 DIAGNOSIS — I252 Old myocardial infarction: Secondary | ICD-10-CM | POA: Diagnosis not present

## 2020-01-06 DIAGNOSIS — N401 Enlarged prostate with lower urinary tract symptoms: Secondary | ICD-10-CM | POA: Diagnosis not present

## 2020-01-06 DIAGNOSIS — E119 Type 2 diabetes mellitus without complications: Secondary | ICD-10-CM | POA: Diagnosis not present

## 2020-01-06 DIAGNOSIS — E785 Hyperlipidemia, unspecified: Secondary | ICD-10-CM | POA: Diagnosis not present

## 2020-01-07 DIAGNOSIS — E1165 Type 2 diabetes mellitus with hyperglycemia: Secondary | ICD-10-CM | POA: Diagnosis not present

## 2020-01-07 DIAGNOSIS — E7800 Pure hypercholesterolemia, unspecified: Secondary | ICD-10-CM | POA: Diagnosis not present

## 2020-01-07 DIAGNOSIS — G609 Hereditary and idiopathic neuropathy, unspecified: Secondary | ICD-10-CM | POA: Diagnosis not present

## 2020-01-07 DIAGNOSIS — I10 Essential (primary) hypertension: Secondary | ICD-10-CM | POA: Diagnosis not present

## 2020-01-07 DIAGNOSIS — E11319 Type 2 diabetes mellitus with unspecified diabetic retinopathy without macular edema: Secondary | ICD-10-CM | POA: Diagnosis not present

## 2020-01-25 ENCOUNTER — Other Ambulatory Visit: Payer: Self-pay | Admitting: Rheumatology

## 2020-02-05 DIAGNOSIS — N400 Enlarged prostate without lower urinary tract symptoms: Secondary | ICD-10-CM | POA: Diagnosis not present

## 2020-02-05 DIAGNOSIS — I252 Old myocardial infarction: Secondary | ICD-10-CM | POA: Diagnosis not present

## 2020-02-05 DIAGNOSIS — I2510 Atherosclerotic heart disease of native coronary artery without angina pectoris: Secondary | ICD-10-CM | POA: Diagnosis not present

## 2020-02-05 DIAGNOSIS — E785 Hyperlipidemia, unspecified: Secondary | ICD-10-CM | POA: Diagnosis not present

## 2020-02-05 DIAGNOSIS — N401 Enlarged prostate with lower urinary tract symptoms: Secondary | ICD-10-CM | POA: Diagnosis not present

## 2020-02-05 DIAGNOSIS — E119 Type 2 diabetes mellitus without complications: Secondary | ICD-10-CM | POA: Diagnosis not present

## 2020-02-05 DIAGNOSIS — M150 Primary generalized (osteo)arthritis: Secondary | ICD-10-CM | POA: Diagnosis not present

## 2020-02-05 DIAGNOSIS — I10 Essential (primary) hypertension: Secondary | ICD-10-CM | POA: Diagnosis not present

## 2020-02-05 DIAGNOSIS — M19049 Primary osteoarthritis, unspecified hand: Secondary | ICD-10-CM | POA: Diagnosis not present

## 2020-02-05 DIAGNOSIS — M179 Osteoarthritis of knee, unspecified: Secondary | ICD-10-CM | POA: Diagnosis not present

## 2020-02-07 NOTE — Progress Notes (Signed)
Primary Physician/Referring:  Aura Dials, MD  Patient ID: Alan Wang, male    DOB: 04/20/1950, 69 y.o.   MRN: [id number]  Chief Complaint  Patient presents with  . Coronary Artery Disease  . Hypertension  . Hyperlipidemia  . Follow-up   HPI:    Alan Wang  is a 69 y.o. Caucasian male with history of known coronary artery disease. He has had coronary angioplasty to his LAD, circumflex and also ramus intermediate vessels. He has history of diabetes mellitus with diabetic retinopathy, hypertension and hyperlipidemia. Last coronary angiography on 09/16/2015 after a abnormal stress test revealing fully occluded moderate sized PL branch of RCA but had excellent collaterals.  Patient presents for annual follow up of coronary artery disease.  He remains asymptomatic and is tolerating guideline directed therapy well.  Denies chest pain, dyspnea, dizziness, edema, palpitations, PND, orthopnea. He has no symptoms of claudication. No symptoms suggestive of TIA or stroke.  According to patient diabetes is well controlled, he continues to follow closely with endocrinology.  Patient does report over the last 1-2 years he has been less active since the COVID-19 pandemic.  He also reports significant pain secondary to arthritis which is limiting his activity as well.  He presently follows with Ortho for this.  Past Medical History:  Diagnosis Date  . Arthritis   . Asthma   . Coronary artery disease   . Diabetes mellitus    type 2, insulin dependent for 30+ years  . History of kidney stones 11/2012  . Hyperlipemia   . Hypertension   . IDDM (insulin dependent diabetes mellitus)   . Shortness of breath   . Stroke ([place])    hx TIA   Past Surgical History:  Procedure Laterality Date  . ANGIOPLASTY  04/06/2011   x3   . CARDIAC CATHETERIZATION    . CARDIAC CATHETERIZATION N/A 09/16/2015   Procedure: Left Heart Cath and Coronary Angiography;  Surgeon: Adrian Prows, MD;  Location: [facility];  Service: Cardiovascular;  Laterality: N/A;  . cataracts    . CHOLECYSTECTOMY    . HERNIA REPAIR     x2  . KNEE ARTHROSCOPY    . LEFT HEART CATHETERIZATION WITH CORONARY ANGIOGRAM N/A 03/08/2011   Procedure: LEFT HEART CATHETERIZATION WITH CORONARY ANGIOGRAM;  Surgeon: Laverda Page, MD;  Location: [facility] CATH LAB;  Service: Cardiovascular;  Laterality: N/A;  . PERCUTANEOUS CORONARY STENT INTERVENTION (PCI-S) N/A 04/06/2011   Procedure: PERCUTANEOUS CORONARY STENT INTERVENTION (PCI-S);  Surgeon: Laverda Page, MD;  Location: [facility] CATH LAB;  Service: Cardiovascular;  Laterality: N/A;   Social History   Tobacco Use  . Smoking status: Never Smoker  . Smokeless tobacco: Never Used  Substance Use Topics  . Alcohol use: Yes    Comment: occasionally   Marital status: Married  ROS  Review of Systems  Constitutional: Negative for chills, decreased appetite, malaise/fatigue and weight gain.  Cardiovascular: Negative for dyspnea on exertion, leg swelling and syncope.  Endocrine: Negative for cold intolerance.  Hematologic/Lymphatic: Does not bruise/bleed easily.  Musculoskeletal: Positive for arthritis (knee bilateral.  h/o rheumatoid arthritis), back pain and joint pain. Negative for joint swelling.  Gastrointestinal: Negative for abdominal pain, anorexia, change in bowel habit, hematochezia and melena.  Neurological: Negative for headaches and light-headedness.  Psychiatric/Behavioral: Negative for depression and substance abuse.  All other systems reviewed and are negative.  Objective  Blood pressure 113/74, pulse 80, resp. rate 16, height 5\' 10"  (1.778 m), weight 209 lb (  94.8 kg), SpO2 97 %.  Vitals with BMI 02/08/2020 11/15/2019 05/15/2019  Height 5\' 10"  5\' 10"  5\' 10"   Weight 209 lbs 216 lbs 5 oz 212 lbs 8 oz  BMI 29.99 27.06 23.76  Systolic 283 151 761  Diastolic 74 89 60  Pulse 80 76 75     Physical Exam Vitals reviewed.  Constitutional:      Comments: Well build and  mildly obese in no acute distress.  HENT:     Head: Normocephalic and atraumatic.  Eyes:     Conjunctiva/sclera: Conjunctivae normal.  Neck:     Thyroid: No thyromegaly.     Vascular: No JVD.  Cardiovascular:     Rate and Rhythm: Normal rate and regular rhythm.     Pulses:          Carotid pulses are 2+ on the right side and 2+ on the left side.      Radial pulses are 2+ on the right side and 2+ on the left side.       Femoral pulses are 2+ on the right side and 2+ on the left side.      Popliteal pulses are 1+ on the right side and 1+ on the left side.       Dorsalis pedis pulses are 2+ on the right side and 2+ on the left side.       Posterior tibial pulses are 1+ on the right side and 1+ on the left side.     Heart sounds: Normal heart sounds, S1 normal and S2 normal. No murmur heard.  No gallop.      Comments: No leg edema, no JVD. Pulmonary:     Effort: Pulmonary effort is normal. No respiratory distress.     Breath sounds: Normal breath sounds. No wheezing, rhonchi or rales.  Abdominal:     General: Bowel sounds are normal.     Palpations: Abdomen is soft.  Musculoskeletal:        General: Normal range of motion.     Cervical back: Neck supple.     Right lower leg: No edema.     Left lower leg: No edema.  Skin:    General: Skin is warm and dry.  Neurological:     Mental Status: He is alert.    Laboratory examination:   Recent Labs    04/24/19 1205 10/26/19 1506  NA 140 141  K 5.1 4.9  CL 104 103  CO2 30 27  GLUCOSE 269* 113*  BUN 17 16  CREATININE 0.80 0.84  CALCIUM 9.8 9.6  GFRNONAA 91 89  GFRAA 105 103   CrCl cannot be calculated (Patient's most recent lab result is older than the maximum 21 days allowed.).  CMP Latest Ref Rng & Units 10/26/2019 04/24/2019 01/22/2019  Glucose 65 - 99 mg/dL 113(H) 269(H) 154(H)  BUN 7 - 25 mg/dL 16 17 15   Creatinine 0.70 - 1.18 mg/dL 0.84 0.80 0.92  Sodium 135 - 146 mmol/L 141 140 139  Potassium 3.5 - 5.3 mmol/L 4.9  5.1 4.5  Chloride 98 - 110 mmol/L 103 104 104  CO2 20 - 32 mmol/L 27 30 29   Calcium 8.6 - 10.3 mg/dL 9.6 9.8 9.3  Total Protein 6.1 - 8.1 g/dL 6.8 6.3 6.0(L)  Total Bilirubin 0.2 - 1.2 mg/dL 0.4 0.5 0.5  Alkaline Phos 40 - 115 U/L - - -  AST 10 - 35 U/L 26 17 19   ALT 9 - 46 U/L 26 17 16  CBC Latest Ref Rng & Units 10/26/2019 04/24/2019 01/22/2019  WBC 3.8 - 10.8 Thousand/uL 8.4 5.9 6.4  Hemoglobin 13.2 - 17.1 g/dL 17.1 16.4 16.4  Hematocrit 38 - 50 % 51.1(H) 48.7 49.4  Platelets 140 - 400 Thousand/uL 197 181 184   Lipid Panel     Component Value Date/Time   CHOL 115 06/23/2016 0617   TRIG 80 06/23/2016 0617   HDL 40 (L) 06/23/2016 0617   CHOLHDL 2.9 06/23/2016 0617   VLDL 16 06/23/2016 0617   LDLCALC 59 06/23/2016 0617   HEMOGLOBIN A1C Lab Results  Component Value Date   HGBA1C 5.8 (H) 06/23/2016   MPG 120 06/23/2016   TSH No results for input(s): TSH in the last 8760 hours.   External Labs:  12/04/2019: HDL 42, LDL 52, total cholesterol 110, triglycerides 78  05/26/2017: TSH 3.23  Medications and allergies   Allergies  Allergen Reactions  . Avandia [Rosiglitazone Maleate] Swelling    Lower extremities      Current Outpatient Medications  Medication Instructions  . aspirin EC 81 mg, Oral, Daily at bedtime  . carvedilol (COREG) 12.5 MG tablet TAKE 1 TABLET TWICE DAILY  WITH MEALS  . chlorpheniramine (CHLOR-TRIMETON) 4 mg, Oral, Daily at bedtime  . CINNAMON PO 2,000 mg, Oral, 2 times daily  . DULoxetine (CYMBALTA) 30 mg, Daily at bedtime  . FARXIGA 10 MG TABS tablet No dose, route, or frequency recorded.  . insulin NPH Human (NOVOLIN N) 5-25 Units, Subcutaneous, See admin instructions, 10 units in morning 20 units bedtime  . insulin regular (NOVOLIN R) 20 Units, Subcutaneous, 2 times daily before meals  . metFORMIN (GLUCOPHAGE-XR) 1,000 mg, Oral, 2 times daily  . methocarbamol (ROBAXIN) 500 mg, Oral, 2 times daily PRN  . naproxen sodium (ALEVE) 220 mg, Oral, 2  times daily with meals  . nitroGLYCERIN (NITROSTAT) 0.4 mg, Sublingual, Every 5 min PRN  . olmesartan (BENICAR) 20 MG tablet TAKE 1 TABLET DAILY  . ONETOUCH VERIO test strip USE TO TEST BLOOD SUGAR 4 TIMES DAILY  . rosuvastatin (CRESTOR) 10 mg, Oral, Daily  . sulfaSALAzine (AZULFIDINE) 500 MG tablet TAKE 2 TABLETS(1,000MG      TOTAL) TWICE A DAY  . traMADol (ULTRAM) 50 mg, Oral, At bedtime PRN  . vitamin C 1,000 mg, Daily  . Vitamin D 2,000 Units, Oral, Daily    Radiology:  No results found.  Cardiac Studies:   Coronary angiogram 09/16/2015:  Mid LAD 3.0 x 23 mm and 2.75 x 12 mm Xience placed 05/25/08, ramus intermediate 3.5 x 15 mm integrity DES placed 03/08/11 and proximal and mid circumflex 2.75 x 22 2 mm resolute placed 04/06/2011 widely patent, occluded PL branch of RCA with collaterals from the circumflex coronary artery.  LVEF normal at 55%.  Lower extremity arterial duplex 03/17/2017: No hemodynamically significant stenoses are identified in the  lower extremity arterial system. This exam reveals mildly decreased perfusion of the right lower extremity and normal perfusion of the left lower extremity (ABI).  RABI 0.86, LABI 1.00. Triphasic waveform(normal) noted at both ankle. Study may suggest minimal diffuse disease.  Echocardiogram  [08/26/2015]: Left ventricle cavity is normal in size. Moderate concentric hypertrophy of the left ventricle. Borderline global hypokinesis. Doppler evidence of grade I (impaired) diastolic dysfunction. Left ventricle regional wall motion findings: No wall motion abnormalities. Visual EF is 45-50%. Calculated EF 45%. Left atrial cavity is mildly dilated. Trace mitral regurgitation. Trace tricuspid regurgitation. Unable to estimate PA pressure due to absence/minimal TR signal. Compared to  the study done on 02/03/2011, EF previously normal.  Nuclear stress test  [08/22/2015]:  1. Resting EKG demonstrated normal sinus rhythm, normal axis, poor R-wave  progression, low voltage complexes. Stress EKG is nondiagnostic for ischemia as its pharmacologic stress test. Stress symptoms included dyspnea. 2. Perfusion imaging study demonstrates a small sized mild lateral wall ischemia noted towards the apex. In addition there is mild anterior wall thinning extending from the mid ventricle towards the apex. The left ventricle is normal in size with rest and stress images. Left ventricular systolic function calculated by QGS was moderately depressed at 37% with global hypokinesis. In a patient with knows severe 3 vessel CAD and PTCA, this represents a high risk study.  Nocturnal Oximetry 09/30/2015:  02 saturation less than 89% for 4 hours and less than 88% for 3 hours, overall low 79%. Patient qualifies for nocturnal oxygen supplementation in Medicare group 1. Referred for sleep study.  EKG   02/08/2020: Sinus rhythm at a rate of 82 bpm, left atrial enlargement, normal axis.  Poor R wave progression, cannot exclude anteroseptal infarct old.  Nonspecific T wave abnormality.  Compared to EKG 03/05/2019, PRWP new.  03/05/2019: Normal sinus rhythm at rate of 69 bpm, normal axis, low voltage complexes, nonspecific T abnormality.No significant change from EKG 03/02/2018.  Assessment     ICD-10-CM   1. Coronary artery disease involving native coronary artery of native heart without angina pectoris  I25.10 EKG 12-Lead    PCV ECHOCARDIOGRAM COMPLETE  2. Mixed hyperlipidemia  E78.2   3. Primary hypertension  I10 PCV ECHOCARDIOGRAM COMPLETE  4. H/O TIA (transient ischemic attack) and stroke 2008  Z86.73      No orders of the defined types were placed in this encounter.  There are no discontinued medications.   Recommendations:    Alan Wang  is a 69 y.o. Caucasian male with history of known coronary artery disease. He has had coronary angioplasty to his LAD, circumflex and also ramus intermediate vessels. He has history of diabetes mellitus with diabetic  retinopathy, hypertension and hyperlipidemia. Last coronary angiography on 09/16/2015 after a abnormal stress test revealing flush occluded moderate sized PL branch of RCA but had excellent collaterals.  Patient presents for his annual visit and follow-up of coronary artery disease.  He remains asymptomatic and is tolerating guideline directed medical therapy well.  He has had no recurrence of angina pectoris and there are no clinical signs of heart failure.  No significant change in EKG compared to 03/05/2019. Blood pressure is well controlled.  I have reviewed external labs, lipids are under excellent control.  We will continue guideline directed medical therapy including aspirin, carvedilol, olmesartan, rosuvastatin, and as needed sublingual nitroglycerin.  Patient has not had echocardiogram in the last 5 years, will repeat at this time.  Follow-up in 1 year for coronary artery disease, hypertension, and hyperlipidemia.   Alethia Berthold, PA-C 02/08/2020, 12:35 PM Office: [phone]

## 2020-02-08 ENCOUNTER — Other Ambulatory Visit: Payer: Self-pay

## 2020-02-08 ENCOUNTER — Encounter: Payer: Self-pay | Admitting: Student

## 2020-02-08 ENCOUNTER — Ambulatory Visit: Payer: Medicare Other | Admitting: Student

## 2020-02-08 VITALS — BP 113/74 | HR 80 | Resp 16 | Ht 70.0 in | Wt 209.0 lb

## 2020-02-08 DIAGNOSIS — E782 Mixed hyperlipidemia: Secondary | ICD-10-CM | POA: Diagnosis not present

## 2020-02-08 DIAGNOSIS — Z8673 Personal history of transient ischemic attack (TIA), and cerebral infarction without residual deficits: Secondary | ICD-10-CM | POA: Diagnosis not present

## 2020-02-08 DIAGNOSIS — I10 Essential (primary) hypertension: Secondary | ICD-10-CM

## 2020-02-08 DIAGNOSIS — I2510 Atherosclerotic heart disease of native coronary artery without angina pectoris: Secondary | ICD-10-CM

## 2020-02-14 ENCOUNTER — Ambulatory Visit: Payer: Medicare Other | Admitting: Cardiology

## 2020-02-21 ENCOUNTER — Other Ambulatory Visit: Payer: Self-pay

## 2020-02-21 ENCOUNTER — Encounter (HOSPITAL_BASED_OUTPATIENT_CLINIC_OR_DEPARTMENT_OTHER): Payer: Medicare Other | Attending: Internal Medicine | Admitting: Internal Medicine

## 2020-02-21 DIAGNOSIS — I2510 Atherosclerotic heart disease of native coronary artery without angina pectoris: Secondary | ICD-10-CM | POA: Diagnosis not present

## 2020-02-21 DIAGNOSIS — Z8673 Personal history of transient ischemic attack (TIA), and cerebral infarction without residual deficits: Secondary | ICD-10-CM | POA: Insufficient documentation

## 2020-02-21 DIAGNOSIS — L98498 Non-pressure chronic ulcer of skin of other sites with other specified severity: Secondary | ICD-10-CM | POA: Diagnosis not present

## 2020-02-21 DIAGNOSIS — E11622 Type 2 diabetes mellitus with other skin ulcer: Secondary | ICD-10-CM | POA: Diagnosis not present

## 2020-02-21 DIAGNOSIS — Z85828 Personal history of other malignant neoplasm of skin: Secondary | ICD-10-CM | POA: Insufficient documentation

## 2020-02-21 DIAGNOSIS — L304 Erythema intertrigo: Secondary | ICD-10-CM | POA: Insufficient documentation

## 2020-02-22 NOTE — Progress Notes (Signed)
SKYLAR PRIEST ([id number]) , Visit Report for 02/21/2020 Chief Complaint Document Details Patient Name: Date of Service: [name] 02/21/2020 9:00 A M Medical Record Number: [id number] Patient Account Number: [id number] Date of Birth/Sex: Treating RN: December 31, 1950 (69 y.o. Male) Deon Pilling Primary Care Provider: Aura Dials Other Clinician: Referring Provider: Treating Provider/Extender: Gabriel Carina in Treatment: 0 Information Obtained from: Patient Chief Complaint Wound exam; patient is here for review of his skin condition in his coccyx area and surrounding buttock Electronic Signature(s) Signed: 02/22/2020 8:01:39 AM By: Linton Ham MD Entered By: Linton Ham on 02/21/2020 10:00:00 -------------------------------------------------------------------------------- Debridement Details Patient Name: Date of Service: [name], [name] 02/21/2020 9:00 A M Medical Record Number: [id number] Patient Account Number: [id number] Date of Birth/Sex: Treating RN: May 21, 1950 (69 y.o. Male) Deon Pilling Primary Care Provider: Aura Dials Other Clinician: Referring Provider: Treating Provider/Extender: Gabriel Carina in Treatment: 0 Debridement Performed for Assessment: Wound #1 Coccyx Performed By: Clinician Carlene Coria, RN Debridement Type: Chemical/Enzymatic/Mechanical Agent Used: gauze and wound cleanser Level of Consciousness (Pre-procedure): Awake and Alert Pre-procedure Verification/Time Out Yes - 09:49 Taken: Start Time: 09:50 Bleeding: None Response to Treatment: Procedure was tolerated well Level of Consciousness (Post- Awake and Alert procedure): Post Debridement Measurements of Total Wound Length: (cm) 5 Width: (cm) 4 Depth: (cm) 0.1 Volume: (cm) 1.571 Character of Wound/Ulcer Post Debridement: Improved Post Procedure Diagnosis Same as Pre-procedure Electronic Signature(s) Signed: 02/21/2020  5:23:27 PM By: Deon Pilling Signed: 02/22/2020 8:01:39 AM By: Linton Ham MD Entered By: Linton Ham on 02/21/2020 09:59:25 -------------------------------------------------------------------------------- HPI Details Patient Name: Date of Service: [name], [name] 02/21/2020 9:00 A M Medical Record Number: [id number] Patient Account Number: [id number] Date of Birth/Sex: Treating RN: 10-20-1950 (69 y.o. Male) Deon Pilling Primary Care Provider: Aura Dials Other Clinician: Referring Provider: Treating Provider/Extender: Gabriel Carina in Treatment: 0 History of Present Illness HPI Description: ADMISSION 02/21/2020 This is a 69 year old man with type 2 diabetes well controlled. For the last 3 months he has had a difficult painful/burning area in his coccyx extending between the gluteal cleft. He went to see his primary doctor who probably prescribed nystatin powder although they never managed to pick this up. They have been using something called [place] salve and paradoxically the areas gotten a lot better. There are small open areas but apparently one point these were quite a bit larger. Past medical history includes coronary artery disease, type 2 diabetes,o Rheumatoid arthritis, history of skin cancer in his left upper arm, asthma, CVA Electronic Signature(s) Signed: 02/22/2020 8:01:39 AM By: Linton Ham MD Entered By: Linton Ham on 02/21/2020 10:02:27 -------------------------------------------------------------------------------- Physical Exam Details Patient Name: Date of Service: [name] 02/21/2020 9:00 A M Medical Record Number: [id number] Patient Account Number: [id number] Date of Birth/Sex: Treating RN: July 03, 1950 (69 y.o. Male) Deon Pilling Primary Care Provider: Aura Dials Other Clinician: Referring Provider: Treating Provider/Extender: Gabriel Carina in Treatment:  0 Constitutional Sitting or standing Blood Pressure is within target range for patient.. Pulse regular and within target range for patient.[name] Respirations regular, non-labored and within target range.. Temperature is normal and within the target range for the patient.[name] Appears in no distress. Integumentary (Hair, Skin) I see no widespread cutaneous lesions. Notes Wound exam; the area is localized to the coccyx area extending into the buttocks just below the gluteal cleft. Mirror-image erythema which looks like intertrigo. I can see the area that was initially involved  here. Quite a bit more extensive they are actually doing something right probably keeping this dry. Electronic Signature(s) Signed: 02/22/2020 8:01:39 AM By: Linton Ham MD Entered By: Linton Ham on 02/21/2020 10:03:40 -------------------------------------------------------------------------------- Physician Orders Details Patient Name: Date of Service: [name] 02/21/2020 9:00 A M Medical Record Number: [id number] Patient Account Number: [id number] Date of Birth/Sex: Treating RN: 1951-01-18 (69 y.o. Male) Deon Pilling Primary Care Provider: Aura Dials Other Clinician: Referring Provider: Treating Provider/Extender: Gabriel Carina in Treatment: 0 Verbal / Phone Orders: No Diagnosis Coding Follow-up Appointments Return Appointment in 1 week. Bathing/ Shower/ Hygiene May shower and wash wound with soap and water. Off-Loading Turn and reposition every 2 hours - while laying down. ensure while sitting to adjust/move to offload pressure to affected area. Wound Treatment Wound #1 - Coccyx Cleanser: Soap and Water 1 x Per Day/30 Days Discharge Instructions: May shower and wash wound with dial antibacterial soap and water prior to dressing change. Topical: Ketoconazole Cream 2% 1 x Per Day/30 Days Discharge Instructions: Apply Ketoconazole to reddness and wound area daily under  alginate silver. Prim Dressing: KerraCel Ag Gelling Fiber Dressing, 2x2 in (silver alginate) (DME) (Generic) 1 x Per Day/30 Days ary Discharge Instructions: Apply silver alginate over ketoconazole cream. Secondary Dressing: Woven Gauze Sponge, Non-Sterile 4x4 in (DME) (Generic) 1 x Per Day/30 Days Discharge Instructions: Apply over primary dressing as directed. Secured With: 45M Medipore H Soft Cloth Surgical T 4 x 2 (in/yd) (DME) (Generic) 1 x Per Day/30 Days ape Discharge Instructions: Secure dressing with tape as directed. Patient Medications llergies: Avandia A Notifications Medication Indication Start End 02/21/2020 ketoconazole DOSE topical 2 % cream - cream topical to affected area lightly daily Electronic Signature(s) Signed: 02/21/2020 10:07:40 AM By: Linton Ham MD Entered By: Linton Ham on 02/21/2020 10:07:40 -------------------------------------------------------------------------------- Problem List Details Patient Name: Date of Service: [name], [name] 02/21/2020 9:00 A M Medical Record Number: [id number] Patient Account Number: [id number] Date of Birth/Sex: Treating RN: Oct 08, 1950 (69 y.o. Male) Deon Pilling Primary Care Provider: Aura Dials Other Clinician: Referring Provider: Treating Provider/Extender: Gabriel Carina in Treatment: 0 Active Problems ICD-10 Encounter Code Description Active Date MDM Code Description Active Date MDM Diagnosis L30.4 Erythema intertrigo 02/21/2020 No Yes L98.498 Non-pressure chronic ulcer of skin of other sites with other specified severity 02/21/2020 No Yes Inactive Problems Resolved Problems Electronic Signature(s) Signed: 02/22/2020 8:01:39 AM By: Linton Ham MD Entered By: Linton Ham on 02/21/2020 09:59:02 -------------------------------------------------------------------------------- Progress Note Details Patient Name: Date of Service: [name], [name] 02/21/2020 9:00  A M Medical Record Number: [id number] Patient Account Number: [id number] Date of Birth/Sex: Treating RN: 1950-11-25 (69 y.o. Male) Deon Pilling Primary Care Provider: Aura Dials Other Clinician: Referring Provider: Treating Provider/Extender: Gabriel Carina in Treatment: 0 Subjective Chief Complaint Information obtained from Patient Wound exam; patient is here for review of his skin condition in his coccyx area and surrounding buttock History of Present Illness (HPI) ADMISSION 02/21/2020 This is a 69 year old man with type 2 diabetes well controlled. For the last 3 months he has had a difficult painful/burning area in his coccyx extending between the gluteal cleft. He went to see his primary doctor who probably prescribed nystatin powder although they never managed to pick this up. They have been using something called [place] salve and paradoxically the areas gotten a lot better. There are small open areas but apparently one point these were quite a bit larger. Past medical  history includes coronary artery disease, type 2 diabetes,o Rheumatoid arthritis, history of skin cancer in his left upper arm, asthma, CVA Patient History Information obtained from Patient. Allergies Avandia Family History Diabetes - Siblings, Heart Disease - Paternal Grandparents,Maternal Grandparents, Hypertension - Father, No family history of Cancer, Hereditary Spherocytosis, Kidney Disease, Lung Disease, Seizures, Stroke, Thyroid Problems, Tuberculosis. Social History Never smoker, Marital Status - Married, Alcohol Use - Moderate, Drug Use - No History, Caffeine Use - Daily. Medical History Eyes Denies history of Cataracts, Glaucoma, Optic Neuritis Ear/Nose/Mouth/Throat Denies history of Chronic sinus problems/congestion, Middle ear problems Hematologic/Lymphatic Denies history of Anemia, Hemophilia, Human Immunodeficiency Virus, Lymphedema, Sickle Cell  Disease Respiratory Patient has history of Asthma Denies history of Aspiration, Chronic Obstructive Pulmonary Disease (COPD), Pneumothorax, Sleep Apnea, Tuberculosis Cardiovascular Patient has history of Coronary Artery Disease, Hypertension Denies history of Angina, Arrhythmia, Congestive Heart Failure, Deep Vein Thrombosis, Hypotension, Myocardial Infarction, Peripheral Arterial Disease, Peripheral Venous Disease, Phlebitis, Vasculitis Gastrointestinal Denies history of Cirrhosis , Colitis, Crohnoos, Hepatitis A, Hepatitis B, Hepatitis C Endocrine Patient has history of Type II Diabetes Denies history of Type I Diabetes Genitourinary Denies history of End Stage Renal Disease Immunological Denies history of Lupus Erythematosus, Raynaudoos, Scleroderma Integumentary (Skin) Denies history of History of Burn Musculoskeletal Denies history of Gout, Rheumatoid Arthritis, Osteoarthritis, Osteomyelitis Neurologic Denies history of Dementia, Neuropathy, Quadriplegia, Paraplegia, Seizure Disorder Oncologic Denies history of Received Chemotherapy, Received Radiation Psychiatric Denies history of Anorexia/bulimia, Confinement Anxiety Patient is treated with Insulin, Oral Agents. Blood sugar is not tested. Review of Systems (ROS) Constitutional Symptoms (General Health) Denies complaints or symptoms of Fatigue, Fever, Chills, Marked Weight Change. Eyes Denies complaints or symptoms of Dry Eyes, Vision Changes, Glasses / Contacts. Ear/Nose/Mouth/Throat Denies complaints or symptoms of Chronic sinus problems or rhinitis. Respiratory Denies complaints or symptoms of Chronic or frequent coughs, Shortness of Breath. Cardiovascular Denies complaints or symptoms of Chest pain. Gastrointestinal Denies complaints or symptoms of Frequent diarrhea, Nausea, Vomiting. Endocrine Denies complaints or symptoms of Heat/cold intolerance. Genitourinary Denies complaints or symptoms of Frequent  urination. Integumentary (Skin) Complains or has symptoms of Wounds. Musculoskeletal Denies complaints or symptoms of Muscle Pain, Muscle Weakness. Neurologic Denies complaints or symptoms of Numbness/parasthesias. Objective Constitutional Sitting or standing Blood Pressure is within target range for patient.. Pulse regular and within target range for patient.[name] Respirations regular, non-labored and within target range.. Temperature is normal and within the target range for the patient.[name] Appears in no distress. Vitals Time Taken: 9:13 AM, Height: 70 in, Source: Stated, Weight: 204 lbs, Source: Stated, BMI: 29.3, Temperature: 99.2 F, Pulse: 80 bpm, Respiratory Rate: 18 breaths/min, Blood Pressure: 127/65 mmHg. General Notes: Wound exam; the area is localized to the coccyx area extending into the buttocks just below the gluteal cleft. Mirror-image erythema which looks like intertrigo. I can see the area that was initially involved here. Quite a bit more extensive they are actually doing something right probably keeping this dry. Integumentary (Hair, Skin) I see no widespread cutaneous lesions. Wound #1 status is Open. Original cause of wound was Gradually Appeared. The wound is located on the Coccyx. The wound measures 5cm length x 4cm width x 0.1cm depth; 15.708cm^2 area and 1.571cm^3 volume. There is Fat Layer (Subcutaneous Tissue) exposed. There is no tunneling or undermining noted. There is a medium amount of serosanguineous drainage noted. There is large (67-100%) red, pink granulation within the wound bed. Assessment Active Problems ICD-10 Erythema intertrigo Non-pressure chronic ulcer of skin of other sites with other specified severity  Procedures Wound #1 Pre-procedure diagnosis of Wound #1 is an Inflammatory located on the Coccyx . There was a Chemical/Enzymatic/Mechanical debridement performed by Carlene Coria, RN.. Other agent used was gauze and wound cleanser. A time out was  conducted at 09:49, prior to the start of the procedure. There was no bleeding. The procedure was tolerated well. Post Debridement Measurements: 5cm length x 4cm width x 0.1cm depth; 1.571cm^3 volume. Character of Wound/Ulcer Post Debridement is improved. Post procedure Diagnosis Wound #1: Same as Pre-Procedure Plan Follow-up Appointments: Return Appointment in 1 week. Bathing/ Shower/ Hygiene: May shower and wash wound with soap and water. Off-Loading: Turn and reposition every 2 hours - while laying down. ensure while sitting to adjust/move to offload pressure to affected area. The following medication(s) was prescribed: ketoconazole topical 2 % cream cream topical to affected area lightly daily starting 02/21/2020 WOUND #1: - Coccyx Wound Laterality: Cleanser: Soap and Water 1 x Per Day/30 Days Discharge Instructions: May shower and wash wound with dial antibacterial soap and water prior to dressing change. Topical: Ketoconazole Cream 2% 1 x Per Day/30 Days Discharge Instructions: Apply Ketoconazole to reddness and wound area daily under alginate silver. Prim Dressing: KerraCel Ag Gelling Fiber Dressing, 2x2 in (silver alginate) (DME) (Generic) 1 x Per Day/30 Days ary Discharge Instructions: Apply silver alginate over ketoconazole cream. Secondary Dressing: Woven Gauze Sponge, Non-Sterile 4x4 in (DME) (Generic) 1 x Per Day/30 Days Discharge Instructions: Apply over primary dressing as directed. Secured With: 22M Medipore H Soft Cloth Surgical T 4 x 2 (in/yd) (DME) (Generic) 1 x Per Day/30 Days ape Discharge Instructions: Secure dressing with tape as directed. 1. I am going to do ketoconazole topical cream 2% very lightly to the area silver alginate gauze and tape I have advised him to keep the pressure off this area. He states that he was sitting on the couch almost all day watching TV for the last 2 years. There are some small open areas here pinpoint. The area is confluent and red.  This looks like intertrigo probably fungal contributing. Electronic Signature(s) Signed: 02/22/2020 8:01:39 AM By: Linton Ham MD Entered By: Linton Ham on 02/21/2020 10:08:45 -------------------------------------------------------------------------------- HxROS Details Patient Name: Date of Service: [name], [name] 02/21/2020 9:00 A M Medical Record Number: [id number] Patient Account Number: [id number] Date of Birth/Sex: Treating RN: [birth year] (69 y.o. Male) Carlene Coria Primary Care Provider: Aura Dials Other Clinician: Referring Provider: Treating Provider/Extender: Gabriel Carina in Treatment: 0 Information Obtained From Patient Constitutional Symptoms (General Health) Complaints and Symptoms: Negative for: Fatigue; Fever; Chills; Marked Weight Change Eyes Complaints and Symptoms: Negative for: Dry Eyes; Vision Changes; Glasses / Contacts Medical History: Negative for: Cataracts; Glaucoma; Optic Neuritis Ear/Nose/Mouth/Throat Complaints and Symptoms: Negative for: Chronic sinus problems or rhinitis Medical History: Negative for: Chronic sinus problems/congestion; Middle ear problems Respiratory Complaints and Symptoms: Negative for: Chronic or frequent coughs; Shortness of Breath Medical History: Positive for: Asthma Negative for: Aspiration; Chronic Obstructive Pulmonary Disease (COPD); Pneumothorax; Sleep Apnea; Tuberculosis Cardiovascular Complaints and Symptoms: Negative for: Chest pain Medical History: Positive for: Coronary Artery Disease; Hypertension Negative for: Angina; Arrhythmia; Congestive Heart Failure; Deep Vein Thrombosis; Hypotension; Myocardial Infarction; Peripheral Arterial Disease; Peripheral Venous Disease; Phlebitis; Vasculitis Gastrointestinal Complaints and Symptoms: Negative for: Frequent diarrhea; Nausea; Vomiting Medical History: Negative for: Cirrhosis ; Colitis; Crohns; Hepatitis A; Hepatitis B;  Hepatitis C Endocrine Complaints and Symptoms: Negative for: Heat/cold intolerance Medical History: Positive for: Type II Diabetes Negative for: Type I Diabetes Time with diabetes:  43 Treated with: Insulin, Oral agents Blood sugar tested every day: No Genitourinary Complaints and Symptoms: Negative for: Frequent urination Medical History: Negative for: End Stage Renal Disease Integumentary (Skin) Complaints and Symptoms: Positive for: Wounds Medical History: Negative for: History of Burn Musculoskeletal Complaints and Symptoms: Negative for: Muscle Pain; Muscle Weakness Medical History: Negative for: Gout; Rheumatoid Arthritis; Osteoarthritis; Osteomyelitis Neurologic Complaints and Symptoms: Negative for: Numbness/parasthesias Medical History: Negative for: Dementia; Neuropathy; Quadriplegia; Paraplegia; Seizure Disorder Hematologic/Lymphatic Medical History: Negative for: Anemia; Hemophilia; Human Immunodeficiency Virus; Lymphedema; Sickle Cell Disease Immunological Medical History: Negative for: Lupus Erythematosus; Raynauds; Scleroderma Oncologic Medical History: Negative for: Received Chemotherapy; Received Radiation Psychiatric Medical History: Negative for: Anorexia/bulimia; Confinement Anxiety Immunizations Pneumococcal Vaccine: Received Pneumococcal Vaccination: No Implantable Devices None Family and Social History Cancer: No; Diabetes: Yes - Siblings; Heart Disease: Yes - Paternal Grandparents,Maternal Grandparents; Hereditary Spherocytosis: No; Hypertension: Yes - Father; Kidney Disease: No; Lung Disease: No; Seizures: No; Stroke: No; Thyroid Problems: No; Tuberculosis: No; Never smoker; Marital Status - Married; Alcohol Use: Moderate; Drug Use: No History; Caffeine Use: Daily; Financial Concerns: No; Food, Clothing or Shelter Needs: No; Support System Lacking: No; Transportation Concerns: No Electronic Signature(s) Signed: 02/22/2020 8:01:39 AM By:  Linton Ham MD Signed: 02/22/2020 5:23:39 PM By: Carlene Coria RN Entered By: Carlene Coria on 02/21/2020 09:19:37 -------------------------------------------------------------------------------- SuperBill Details Patient Name: Date of Service: [name], [name] 02/21/2020 Medical Record Number: [id number] Patient Account Number: [id number] Date of Birth/Sex: Treating RN: 03/22/1950 (69 y.o. Male) Deon Pilling Primary Care Provider: Aura Dials Other Clinician: Referring Provider: Treating Provider/Extender: Gabriel Carina in Treatment: 0 Diagnosis Coding ICD-10 Codes Code Description L30.4 Erythema intertrigo L98.498 Non-pressure chronic ulcer of skin of other sites with other specified severity Facility Procedures CPT4 Code: [id number] Description: 70017 - WOUND CARE VISIT-LEV 4 EST PT Modifier: Quantity: 1 Physician Procedures Electronic Signature(s) Signed: 02/21/2020 5:23:27 PM By: Deon Pilling Signed: 02/22/2020 8:01:39 AM By: Linton Ham MD Entered By: Deon Pilling on 02/21/2020 10:43:15

## 2020-02-22 NOTE — Progress Notes (Signed)
[name] ([id number]) , Visit Report for 02/21/2020 Allergy List Details Patient Name: Date of Service: Alan Morton D. 02/21/2020 9:00 A M Medical Record Number: [id number] Patient Account Number: [id number] Date of Birth/Sex: Treating RN: Dec 14, 1950 (69 y.o. Male) Carlene Coria Primary Care Orren Pietsch: Aura Dials Other Clinician: Referring Maragret Vanacker: Treating Becket Wecker/Extender: Gabriel Carina in Treatment: 0 Allergies Active Allergies [facility] Allergy Notes Electronic Signature(s) Signed: 02/22/2020 5:23:39 PM By: Carlene Coria RN Entered By: Carlene Coria on 02/21/2020 09:14:30 -------------------------------------------------------------------------------- Arrival Information Details Patient Name: Date of Service: Alan Wang, Alan Pray D. 02/21/2020 9:00 A M Medical Record Number: [id number] Patient Account Number: [id number] Date of Birth/Sex: Treating RN: May 11, 1950 (69 y.o. Male) Carlene Coria Primary Care Danylah Holden: Aura Dials Other Clinician: Referring Sheikh Leverich: Treating Dany Walther/Extender: Gabriel Carina in Treatment: 0 Visit Information Patient Arrived: Ambulatory Arrival Time: 09:12 Accompanied By: self Transfer Assistance: None Patient Identification Verified: Yes Secondary Verification Process Completed: Yes Patient Requires Transmission-Based Precautions: No Patient Has Alerts: No Electronic Signature(s) Signed: 02/22/2020 5:23:39 PM By: Carlene Coria RN Entered By: Carlene Coria on 02/21/2020 09:12:59 -------------------------------------------------------------------------------- Clinic Level of Care Assessment Details Patient Name: Date of Service: Alan Morton D. 02/21/2020 9:00 A M Medical Record Number: [id number] Patient Account Number: [id number] Date of Birth/Sex: Treating RN: Dec 16, 1950 (69 y.o. Male) Alan Wang Primary Care Amaree Leeper: Aura Dials Other Clinician: Referring Mycal Conde: Treating  Izik Bingman/Extender: Gabriel Carina in Treatment: 0 Clinic Level of Care Assessment Items TOOL 2 Quantity Score X- 1 0 Use when only an EandM is performed on the INITIAL visit ASSESSMENTS - Nursing Assessment / Reassessment X- 1 20 General Physical Exam (combine w/ comprehensive assessment (listed just below) when performed on new pt. evals) X- 1 25 Comprehensive Assessment (HX, ROS, Risk Assessments, Wounds Hx, etc.) ASSESSMENTS - Wound and Skin A ssessment / Reassessment X - Simple Wound Assessment / Reassessment - one wound 1 5 []  - 0 Complex Wound Assessment / Reassessment - multiple wounds X- 1 10 Dermatologic / Skin Assessment (not related to wound area) ASSESSMENTS - Ostomy and/or Continence Assessment and Care []  - 0 Incontinence Assessment and Management []  - 0 Ostomy Care Assessment and Management (repouching, etc.) PROCESS - Coordination of Care X - Simple Patient / Family Education for ongoing care 1 15 []  - 0 Complex (extensive) Patient / Family Education for ongoing care X- 1 10 Staff obtains Programmer, systems, Records, T Results / Process Orders est []  - 0 Staff telephones HHA, Nursing Homes / Clarify orders / etc []  - 0 Routine Transfer to another Facility (non-emergent condition) []  - 0 Routine Hospital Admission (non-emergent condition) []  - 0 New Admissions / Biomedical engineer / Ordering NPWT Apligraf, etc. , []  - 0 Emergency Hospital Admission (emergent condition) X- 1 10 Simple Discharge Coordination []  - 0 Complex (extensive) Discharge Coordination PROCESS - Special Needs []  - 0 Pediatric / Minor Patient Management []  - 0 Isolation Patient Management []  - 0 Hearing / Language / Visual special needs []  - 0 Assessment of Community assistance (transportation, D/C planning, etc.) []  - 0 Additional assistance / Altered mentation []  - 0 Support Surface(s) Assessment (bed, cushion, seat, etc.) INTERVENTIONS - Wound Cleansing  / Measurement X- 1 5 Wound Imaging (photographs - any number of wounds) []  - 0 Wound Tracing (instead of photographs) X- 1 5 Simple Wound Measurement - one wound []  - 0 Complex Wound Measurement - multiple wounds X- 1 5 Simple Wound Cleansing - one wound []  -  0 Complex Wound Cleansing - multiple wounds INTERVENTIONS - Wound Dressings X - Small Wound Dressing one or multiple wounds 1 10 []  - 0 Medium Wound Dressing one or multiple wounds []  - 0 Large Wound Dressing one or multiple wounds []  - 0 Application of Medications - injection INTERVENTIONS - Miscellaneous []  - 0 External ear exam []  - 0 Specimen Collection (cultures, biopsies, blood, body fluids, etc.) []  - 0 Specimen(s) / Culture(s) sent or taken to Lab for analysis []  - 0 Patient Transfer (multiple staff / Harrel Lemon Lift / Similar devices) []  - 0 Simple Staple / Suture removal (25 or less) []  - 0 Complex Staple / Suture removal (26 or more) []  - 0 Hypo / Hyperglycemic Management (close monitor of Blood Glucose) []  - 0 Ankle / Brachial Index (ABI) - do not check if billed separately Has the patient been seen at the hospital within the last three years: Yes Total Score: 120 Level Of Care: New/Established - Level 4 Electronic Signature(s) Signed: 02/21/2020 5:23:27 PM By: Alan Wang Entered By: Alan Wang on 02/21/2020 10:43:07 -------------------------------------------------------------------------------- Encounter Discharge Information Details Patient Name: Date of Service: Alan Wang, Alan Pray D. 02/21/2020 9:00 A M Medical Record Number: [id number] Patient Account Number: [id number] Date of Birth/Sex: Treating RN: 07/21/1950 (69 y.o. Male) Rhae Hammock Primary Care Josean Lycan: Aura Dials Other Clinician: Referring Landon Bassford: Treating Nicky Milhouse/Extender: Gabriel Carina in Treatment: 0 Encounter Discharge Information Items Post Procedure Vitals Discharge Condition:  Stable Temperature (F): 99.2 Ambulatory Status: Ambulatory Pulse (bpm): 80 Discharge Destination: Home Respiratory Rate (breaths/min): 18 Transportation: Private Auto Blood Pressure (mmHg): 127/65 Accompanied By: self Schedule Follow-up Appointment: Yes Clinical Summary of Care: Patient Declined Electronic Signature(s) Signed: 02/21/2020 10:15:22 AM By: Rhae Hammock RN Entered By: Rhae Hammock on 02/21/2020 10:15:22 -------------------------------------------------------------------------------- Lower Extremity Assessment Details Patient Name: Date of Service: Alan Morton D. 02/21/2020 9:00 A M Medical Record Number: [id number] Patient Account Number: [id number] Date of Birth/Sex: Treating RN: [birth year] (69 y.o. Male) Alan Wang Primary Care Janeka Libman: Aura Dials Other Clinician: Referring Milo Solana: Treating Blessing Zaucha/Extender: Gabriel Carina in Treatment: 0 Electronic Signature(s) Signed: 02/21/2020 5:23:27 PM By: Alan Wang Signed: 02/21/2020 5:23:27 PM By: Alan Wang Entered By: Alan Wang on 02/21/2020 09:48:03 -------------------------------------------------------------------------------- Multi Wound Chart Details Patient Name: Date of Service: Alan Wang, GA RY D. 02/21/2020 9:00 A M Medical Record Number: [id number] Patient Account Number: [id number] Date of Birth/Sex: Treating RN: April 24, 1950 (69 y.o. Male) Alan Wang Primary Care Daphnee Preiss: Aura Dials Other Clinician: Referring Isidra Mings: Treating Rayford Williamsen/Extender: Gabriel Carina in Treatment: 0 Vital Signs Height(in): 70 Pulse(bpm): 80 Weight(lbs): 204 Blood Pressure(mmHg): 127/65 Body Mass Index(BMI): 29 Temperature(F): 99.2 Respiratory Rate(breaths/min): 18 Photos: [1:No Photos Coccyx] [N/A:N/A N/A] Wound Location: [1:Gradually Appeared] [N/A:N/A] Wounding Event: [1:Inflammatory] [N/A:N/A] Primary Etiology: [1:Asthma,  Coronary Artery Disease,] [N/A:N/A] Comorbid History: [1:Hypertension, Type II Diabetes 12/10/2019] [N/A:N/A] Date Acquired: [1:0] [N/A:N/A] Weeks of Treatment: [1:Open] [N/A:N/A] Wound Status: [1:5x4x0.1] [N/A:N/A] Measurements L x W x D (cm) [1:15.708] [N/A:N/A] A (cm) : rea [1:1.571] [N/A:N/A] Volume (cm) : [1:Full Thickness Without Exposed] [N/A:N/A] Classification: [1:Support Structures Medium] [N/A:N/A] Exudate A mount: [1:Serosanguineous] [N/A:N/A] Exudate Type: [1:red, brown] [N/A:N/A] Exudate Color: [1:Large (67-100%)] [N/A:N/A] Granulation A mount: [1:Red, Pink] [N/A:N/A] Granulation Quality: [1:Fat Layer (Subcutaneous Tissue): Yes N/A] Exposed Structures: [1:Fascia: No Tendon: No Muscle: No Joint: No Bone: No None] [N/A:N/A] Epithelialization: [1:Chemical/Enzymatic/Mechanical] [N/A:N/A] Debridement: Pre-procedure Verification/Time Out 09:49 [N/A:N/A] Taken: [1:N/A] [N/A:N/A] Instrument: [1:None] [N/A:N/A] Bleeding: [1:Procedure was tolerated well] [N/A:N/A] Debridement  Treatment Response: [1:5x4x0.1] [N/A:N/A] Post Debridement Measurements L x W x D (cm) [1:1.571] [N/A:N/A] Post Debridement Volume: (cm) [1:Debridement] [N/A:N/A] Treatment Notes Electronic Signature(s) Signed: 02/21/2020 5:23:27 PM By: Alan Wang Signed: 02/22/2020 8:01:39 AM By: Linton Ham MD Entered By: Linton Ham on 02/21/2020 09:59:09 -------------------------------------------------------------------------------- Multi-Disciplinary Care Plan Details Patient Name: Date of Service: Alan Wang, GA RY D. 02/21/2020 9:00 A M Medical Record Number: [id number] Patient Account Number: [id number] Date of Birth/Sex: Treating RN: May 15, 1950 (69 y.o. Male) Alan Wang Primary Care Malloree Raboin: Aura Dials Other Clinician: Referring Mohd Clemons: Treating Bastien Strawser/Extender: Gabriel Carina in Treatment: 0 Active Inactive Abuse / Safety / Falls / Self Care  Management Nursing Diagnoses: Potential for falls Goals: Patient will not experience any injury related to falls Date Initiated: 02/21/2020 Target Resolution Date: 05/02/2020 Goal Status: Active Patient/caregiver will verbalize/demonstrate measure taken to improve self care Date Initiated: 02/21/2020 Target Resolution Date: 03/14/2020 Goal Status: Active Interventions: Provide education on basic hygiene Provide education on fall prevention Notes: Orientation to the Wound Care Program Nursing Diagnoses: Knowledge deficit related to the wound healing center program Goals: Patient/caregiver will verbalize understanding of the [place] Date Initiated: 02/21/2020 Target Resolution Date: 02/28/2020 Goal Status: Active Interventions: Provide education on orientation to the wound center Notes: Pain, Acute or Chronic Nursing Diagnoses: Pain, acute or chronic: actual or potential Potential alteration in comfort, pain Goals: Patient will verbalize adequate pain control and receive pain control interventions during procedures as needed Date Initiated: 02/21/2020 Target Resolution Date: 03/14/2020 Goal Status: Active Patient/caregiver will verbalize comfort level met Date Initiated: 02/21/2020 Target Resolution Date: 03/14/2020 Goal Status: Active Interventions: Encourage patient to take pain medications as prescribed Provide education on pain management Treatment Activities: Administer pain control measures as ordered : 02/21/2020 Notes: Wound/Skin Impairment Nursing Diagnoses: Knowledge deficit related to ulceration/compromised skin integrity Goals: Patient/caregiver will verbalize understanding of skin care regimen Date Initiated: 02/21/2020 Target Resolution Date: 03/14/2020 Goal Status: Active Interventions: Assess patient/caregiver ability to obtain necessary supplies Assess patient/caregiver ability to perform ulcer/skin care regimen upon admission and as  needed Provide education on ulcer and skin care Treatment Activities: Skin care regimen initiated : 02/21/2020 Topical wound management initiated : 02/21/2020 Notes: Electronic Signature(s) Signed: 02/21/2020 5:23:27 PM By: Alan Wang Entered By: Alan Wang on 02/21/2020 09:24:44 -------------------------------------------------------------------------------- Pain Assessment Details Patient Name: Date of Service: Alan Morton D. 02/21/2020 9:00 A M Medical Record Number: [id number] Patient Account Number: [id number] Date of Birth/Sex: Treating RN: May 25, 1950 (69 y.o. Male) Alan Wang Primary Care Rhett Najera: Aura Dials Other Clinician: Referring Jerell Demery: Treating Seyon Strader/Extender: Gabriel Carina in Treatment: 0 Active Problems Location of Pain Severity and Description of Pain Patient Has Paino No Site Locations Rate the pain. Current Pain Level: 0 Pain Management and Medication Current Pain Management: Medication: No Cold Application: No Rest: No Massage: No Activity: No T.E.N.S.: No Heat Application: No Leg drop or elevation: No Is the Current Pain Management Adequate: Adequate How does your wound impact your activities of daily livingo Sleep: No Bathing: No Appetite: No Relationship With Others: No Bladder Continence: No Emotions: No Bowel Continence: No Work: No Toileting: No Drive: No Dressing: No Hobbies: No Electronic Signature(s) Signed: 02/21/2020 5:23:27 PM By: Alan Wang Entered By: Alan Wang on 02/21/2020 09:47:53 -------------------------------------------------------------------------------- Patient/Caregiver Education Details Patient Name: Date of Service: Alan Morton D. 12/16/2021andnbsp9:00 A M Medical Record Number: [id number] Patient Account Number: [id number] Date of Birth/Gender: Treating RN: 02/14/1951 (69 y.o. Male) Alan Wang Primary  Care Physician: Aura Dials Other  Clinician: Referring Physician: Treating Physician/Extender: Gabriel Carina in Treatment: 0 Education Assessment Education Provided To: Patient Education Topics Provided Welcome T [facility] [place]: o Handouts: Welcome T [facility] [place] o Methods: Explain/Verbal, Printed Responses: Reinforcements needed Wound/Skin Impairment: Handouts: Caring for Your Ulcer, Skin Care Do's and Dont's Methods: Explain/Verbal, Printed Responses: Reinforcements needed Electronic Signature(s) Signed: 02/21/2020 5:23:27 PM By: Alan Wang Entered By: Alan Wang on 02/21/2020 09:25:01 -------------------------------------------------------------------------------- Wound Assessment Details Patient Name: Date of Service: Alan Morton D. 02/21/2020 9:00 A M Medical Record Number: [id number] Patient Account Number: [id number] Date of Birth/Sex: Treating RN: 1950/09/20 (69 y.o. Male) Carlene Coria Primary Care Charliene Inoue: Aura Dials Other Clinician: Referring Katie Moch: Treating Deundra Furber/Extender: Gabriel Carina in Treatment: 0 Wound Status Wound Number: 1 Primary Inflammatory Etiology: Wound Location: Coccyx Wound Status: Open Wounding Event: Gradually Appeared Comorbid Asthma, Coronary Artery Disease, Hypertension, Type II Date Acquired: 12/10/2019 History: Diabetes Weeks Of Treatment: 0 Clustered Wound: No Wound Measurements Length: (cm) 5 Width: (cm) 4 Depth: (cm) 0.1 Area: (cm) 15.708 Volume: (cm) 1.571 % Reduction in Area: % Reduction in Volume: Epithelialization: None Tunneling: No Undermining: No Wound Description Classification: Full Thickness Without Exposed Support Structu Exudate Amount: Medium Exudate Type: Serosanguineous Exudate Color: red, brown res Foul Odor After Cleansing: No Slough/Fibrino Yes Wound Bed Granulation Amount: Large (67-100%) Exposed Structure Granulation Quality: Red, Pink Fascia  Exposed: No Fat Layer (Subcutaneous Tissue) Exposed: Yes Tendon Exposed: No Muscle Exposed: No Joint Exposed: No Bone Exposed: No Treatment Notes Wound #1 (Coccyx) Cleanser Soap and Water Discharge Instruction: May shower and wash wound with dial antibacterial soap and water prior to dressing change. Peri-Wound Care Topical Ketoconazole Cream 2% Discharge Instruction: Apply Ketoconazole to reddness and wound area daily under alginate silver. Primary Dressing KerraCel Ag Gelling Fiber Dressing, 2x2 in (silver alginate) Discharge Instruction: Apply silver alginate over ketoconazole cream. Secondary Dressing Woven Gauze Sponge, Non-Sterile 4x4 in Discharge Instruction: Apply over primary dressing as directed. Secured With 18M Medipore H Soft Cloth Surgical T 4 x 2 (in/yd) ape Discharge Instruction: Secure dressing with tape as directed. Compression Wrap Compression Stockings Add-Ons Electronic Signature(s) Signed: 02/22/2020 5:23:39 PM By: Carlene Coria RN Entered By: Carlene Coria on 02/21/2020 09:32:17 -------------------------------------------------------------------------------- Vitals Details Patient Name: Date of Service: Alan Wang, Alan Pray D. 02/21/2020 9:00 A M Medical Record Number: [id number] Patient Account Number: [id number] Date of Birth/Sex: Treating RN: 1950/06/29 (69 y.o. Male) Carlene Coria Primary Care Josue Kass: Aura Dials Other Clinician: Referring Epiphany Seltzer: Treating Ilamae Geng/Extender: Gabriel Carina in Treatment: 0 Vital Signs Time Taken: 09:13 Temperature (F): 99.2 Height (in): 70 Pulse (bpm): 80 Source: Stated Respiratory Rate (breaths/min): 18 Weight (lbs): 204 Blood Pressure (mmHg): 127/65 Source: Stated Reference Range: 80 - 120 mg / dl Body Mass Index (BMI): 29.3 Electronic Signature(s) Signed: 02/22/2020 5:23:39 PM By: Carlene Coria RN Entered By: Carlene Coria on 02/21/2020 09:14:18

## 2020-02-22 NOTE — Progress Notes (Signed)
Alan Wang ([id number]) , Visit Report for 02/21/2020 Abuse/Suicide Risk Screen Details Patient Name: Date of Service: Alan Morton D. 02/21/2020 9:00 A M Medical Record Number: [id number] Patient Account Number: [id number] Date of Birth/Sex: Treating RN: 21-Aug-1950 (69 y.o. Male) Carlene Coria Primary Care Timohty Renbarger: Aura Dials Other Clinician: Referring Collis Thede: Treating York Valliant/Extender: Gabriel Carina in Treatment: 0 Abuse/Suicide Risk Screen Items Answer ABUSE RISK SCREEN: Has anyone close to you tried to hurt or harm you recentlyo No Do you feel uncomfortable with anyone in your familyo No Has anyone forced you do things that you didnt want to doo No Electronic Signature(s) Signed: 02/22/2020 5:23:39 PM By: Carlene Coria RN Entered By: Carlene Coria on 02/21/2020 09:19:46 -------------------------------------------------------------------------------- Activities of Daily Living Details Patient Name: Date of Service: Alan Morton D. 02/21/2020 9:00 A M Medical Record Number: [id number] Patient Account Number: [id number] Date of Birth/Sex: Treating RN: 1951/02/16 (69 y.o. Male) Carlene Coria Primary Care Braelynne Garinger: Aura Dials Other Clinician: Referring Xiomara Sevillano: Treating Jaeley Wiker/Extender: Gabriel Carina in Treatment: 0 Activities of Daily Living Items Answer Activities of Daily Living (Please select one for each item) Drive Automobile Completely Able T Medications ake Completely Able Use T elephone Completely Able Care for Appearance Completely Able Use T oilet Completely Able Bath / Shower Completely Able Dress Self Completely Able Feed Self Completely Able Walk Completely Able Get In / Out Bed Completely Able Housework Completely Able Prepare Meals Completely Able Handle Money Completely Able Shop for Self Completely Able Electronic Signature(s) Signed: 02/22/2020 5:23:39 PM By: Carlene Coria  RN Entered By: Carlene Coria on 02/21/2020 09:20:13 -------------------------------------------------------------------------------- Education Screening Details Patient Name: Date of Service: Alan Morton D. 02/21/2020 9:00 A M Medical Record Number: [id number] Patient Account Number: [id number] Date of Birth/Sex: Treating RN: August 23, 1950 (69 y.o. Male) Carlene Coria Primary Care Caleyah Jr: Aura Dials Other Clinician: Referring Hamed Debella: Treating Ab Leaming/Extender: Gabriel Carina in Treatment: 0 Primary Learner Assessed: Patient Learning Preferences/Education Level/Primary Language Learning Preference: Explanation Highest Education Level: College or Above Preferred Language: English Cognitive Barrier Language Barrier: No Translator Needed: No Memory Deficit: No Emotional Barrier: No Cultural/Religious Beliefs Affecting Medical Care: No Physical Barrier Impaired Vision: No Impaired Hearing: No Decreased Hand dexterity: No Knowledge/Comprehension Knowledge Level: Medium Comprehension Level: High Ability to understand written instructions: High Ability to understand verbal instructions: High Motivation Anxiety Level: Anxious Cooperation: Cooperative Education Importance: Acknowledges Need Interest in Health Problems: Asks Questions Perception: Coherent Willingness to Engage in Self-Management High Activities: Readiness to Engage in Self-Management High Activities: Electronic Signature(s) Signed: 02/22/2020 5:23:39 PM By: Carlene Coria RN Entered By: Carlene Coria on 02/21/2020 09:21:17 -------------------------------------------------------------------------------- Fall Risk Assessment Details Patient Name: Date of Service: Alan Wang, Alan Pray D. 02/21/2020 9:00 A M Medical Record Number: [id number] Patient Account Number: [id number] Date of Birth/Sex: Treating RN: [birth year] (69 y.o. Male) Carlene Coria Primary Care Eldridge Marcott: Aura Dials Other Clinician: Referring Vali Capano: Treating Alexzia Kasler/Extender: Gabriel Carina in Treatment: 0 Fall Risk Assessment Items Have you had 2 or more falls in the last 12 monthso 0 No Have you had any fall that resulted in injury in the last 12 monthso 0 No FALLS RISK SCREEN History of falling - immediate or within 3 months 0 No Secondary diagnosis (Do you have 2 or more medical diagnoseso) 0 No Ambulatory aid None/bed rest/wheelchair/nurse 0 No Crutches/cane/walker 0 No Furniture 0 No Intravenous therapy Access/Saline/Heparin Lock 0 No Gait/Transferring Normal/ bed rest/ wheelchair 0 No  Weak (short steps with or without shuffle, stooped but able to lift head while walking, may seek 0 No support from furniture) Impaired (short steps with shuffle, may have difficulty arising from chair, head down, impaired 0 No balance) Mental Status Oriented to own ability 0 No Electronic Signature(s) Signed: 02/22/2020 5:23:39 PM By: Carlene Coria RN Entered By: Carlene Coria on 02/21/2020 09:21:34 -------------------------------------------------------------------------------- Foot Assessment Details Patient Name: Date of Service: Alan Morton D. 02/21/2020 9:00 A M Medical Record Number: [id number] Patient Account Number: [id number] Date of Birth/Sex: Treating RN: 25-Sep-1950 (69 y.o. Male) Deon Pilling Primary Care Malcom Selmer: Aura Dials Other Clinician: Referring Maximus Hoffert: Treating Rashee Marschall/Extender: Gabriel Carina in Treatment: 0 Foot Assessment Items Site Locations + = Sensation present, - = Sensation absent, C = Callus, U = Ulcer R = Redness, W = Warmth, M = Maceration, PU = Pre-ulcerative lesion F = Fissure, S = Swelling, D = Dryness Assessment Right: Left: Other Deformity: No No Prior Foot Ulcer: No No Prior Amputation: No No Charcot Joint: No No Ambulatory Status: Ambulatory Without Help Gait: Steady Notes NO BLE  wounds. Electronic Signature(s) Signed: 02/21/2020 5:23:27 PM By: Deon Pilling Entered By: Deon Pilling on 02/21/2020 09:48:15 -------------------------------------------------------------------------------- Nutrition Risk Screening Details Patient Name: Date of Service: Alan Morton D. 02/21/2020 9:00 A M Medical Record Number: [id number] Patient Account Number: [id number] Date of Birth/Sex: Treating RN: [birth year] (69 y.o. Male) Carlene Coria Primary Care Freddy Kinne: Aura Dials Other Clinician: Referring Adler Alton: Treating Carlina Derks/Extender: Gabriel Carina in Treatment: 0 Height (in): 70 Weight (lbs): 204 Body Mass Index (BMI): 29.3 Nutrition Risk Screening Items Score Screening NUTRITION RISK SCREEN: I have an illness or condition that made me change the kind and/or amount of food I eat 0 No I eat fewer than two meals per day 0 No I eat few fruits and vegetables, or milk products 0 No I have three or more drinks of beer, liquor or wine almost every day 0 No I have tooth or mouth problems that make it hard for me to eat 0 No I don't always have enough money to buy the food I need 0 No I eat alone most of the time 0 No I take three or more different prescribed or over-the-counter drugs a day 1 Yes Without wanting to, I have lost or gained 10 pounds in the last six months 0 No I am not always physically able to shop, cook and/or feed myself 0 No Nutrition Protocols Good Risk Protocol 0 No interventions needed Moderate Risk Protocol High Risk Proctocol Risk Level: Good Risk Score: 1 Electronic Signature(s) Signed: 02/22/2020 5:23:39 PM By: Carlene Coria RN Entered By: Carlene Coria on 02/21/2020 09:22:14

## 2020-02-25 ENCOUNTER — Ambulatory Visit: Payer: Medicare Other | Admitting: Student

## 2020-02-28 ENCOUNTER — Other Ambulatory Visit: Payer: Self-pay

## 2020-02-28 ENCOUNTER — Encounter (HOSPITAL_BASED_OUTPATIENT_CLINIC_OR_DEPARTMENT_OTHER): Payer: Medicare Other | Admitting: Internal Medicine

## 2020-02-28 DIAGNOSIS — Z8673 Personal history of transient ischemic attack (TIA), and cerebral infarction without residual deficits: Secondary | ICD-10-CM | POA: Diagnosis not present

## 2020-02-28 DIAGNOSIS — L304 Erythema intertrigo: Secondary | ICD-10-CM | POA: Diagnosis not present

## 2020-02-28 DIAGNOSIS — Z85828 Personal history of other malignant neoplasm of skin: Secondary | ICD-10-CM | POA: Diagnosis not present

## 2020-02-28 DIAGNOSIS — I2510 Atherosclerotic heart disease of native coronary artery without angina pectoris: Secondary | ICD-10-CM | POA: Diagnosis not present

## 2020-02-28 DIAGNOSIS — L98498 Non-pressure chronic ulcer of skin of other sites with other specified severity: Secondary | ICD-10-CM | POA: Diagnosis not present

## 2020-02-28 DIAGNOSIS — E11622 Type 2 diabetes mellitus with other skin ulcer: Secondary | ICD-10-CM | POA: Diagnosis not present

## 2020-02-28 NOTE — Progress Notes (Signed)
WAI LITT ([id number]) , Visit Report for 02/28/2020 HPI Details Patient Name: Date of Service: Alan Favor D. 02/28/2020 9:45 A M Medical Record Number: [id number] Patient Account Number: [id number] Date of Birth/Sex: Treating RN: 1950-04-11 (69 y.o. Tammy Sours Primary Care Provider: Henrine Screws Other Clinician: Referring Provider: Treating Provider/Extender: Earle Gell in Treatment: 1 History of Present Illness HPI Description: ADMISSION 02/21/2020 This is a 69 year old man with type 2 diabetes well controlled. For the last 3 months he has had a difficult painful/burning area in his coccyx extending between the gluteal cleft. He went to see his primary doctor who probably prescribed nystatin powder although they never managed to pick this up. They have been using something called [place] salve and paradoxically the areas gotten a lot better. There are small open areas but apparently one point these were quite a bit larger. Past medical history includes coronary artery disease, type 2 diabetes,o Rheumatoid arthritis, history of skin cancer in his left upper arm, asthma, CVA 12/23 he arrives in clinic not receiving any supplies from [facility]. This is apparently tangled up and fed ex. I also ordered ketoconazole for him for the remaining intertrigo however he does not seem to have gotten this either. Electronic Signature(s) Signed: 02/28/2020 3:50:51 PM By: Baltazar Najjar MD Entered By: Baltazar Najjar on 02/28/2020 10:23:32 -------------------------------------------------------------------------------- Physical Exam Details Patient Name: Date of Service: Alan Favor D. 02/28/2020 9:45 A M Medical Record Number: [id number] Patient Account Number: [id number] Date of Birth/Sex: Treating RN: [birth year] (69 y.o. Tammy Sours Primary Care Provider: Henrine Screws Other Clinician: Referring Provider: Treating Provider/Extender: Earle Gell in Treatment: 1 Notes Wound exam; coccyx area and surrounding buttocks in the gluteal cleft. The intertrigo seems a lot better this week. Very little suggestion of an open area that I can see. Electronic Signature(s) Signed: 02/28/2020 3:50:51 PM By: Baltazar Najjar MD Entered By: Baltazar Najjar on 02/28/2020 10:24:04 -------------------------------------------------------------------------------- Physician Orders Details Patient Name: Date of Service: Alan Favor D. 02/28/2020 9:45 A M Medical Record Number: [id number] Patient Account Number: [id number] Date of Birth/Sex: Treating RN: 06/12/1950 (69 y.o. Tammy Sours Primary Care Provider: Henrine Screws Other Clinician: Referring Provider: Treating Provider/Extender: Earle Gell in Treatment: 1 Verbal / Phone Orders: No Diagnosis Coding ICD-10 Coding Code Description L30.4 Erythema intertrigo L98.498 Non-pressure chronic ulcer of skin of other sites with other specified severity Follow-up Appointments Return Appointment in 1 week. Bathing/ Shower/ Hygiene May shower and wash wound with soap and water. Off-Loading Turn and reposition every 2 hours - while laying down. ensure while sitting to adjust/move to offload pressure to affected area. Additional Orders / Instructions Other: - ketoconazole cream sent to your pharmacy. Pick it up from there. Wound Treatment Wound #1 - Coccyx Cleanser: Soap and Water 1 x Per Day/30 Days Discharge Instructions: May shower and wash wound with dial antibacterial soap and water prior to dressing change. Topical: Ketoconazole Cream 2% 1 x Per Day/30 Days Discharge Instructions: Apply Ketoconazole to reddness and wound area daily under alginate silver. Prim Dressing: KerraCel Ag Gelling Fiber Dressing, 2x2 in (silver alginate) (Generic) 1 x Per Day/30 Days ary Discharge Instructions: Apply silver alginate over ketoconazole  cream. Secondary Dressing: Woven Gauze Sponge, Non-Sterile 4x4 in (Generic) 1 x Per Day/30 Days Discharge Instructions: Apply over primary dressing as directed. Secured With: 73M Medipore H Soft Cloth Surgical T 4 x 2 (in/yd) (Generic) 1 x Per Day/30 Days  ape Discharge Instructions: Secure dressing with tape as directed. Electronic Signature(s) Signed: 02/28/2020 3:50:51 PM By: Linton Ham MD Signed: 02/28/2020 4:19:56 PM By: Deon Pilling Entered By: Deon Pilling on 02/28/2020 09:59:40 -------------------------------------------------------------------------------- Problem List Details Patient Name: Date of Service: Alan Wang, Alan Pray D. 02/28/2020 9:45 A M Medical Record Number: [id number] Patient Account Number: [id number] Date of Birth/Sex: Treating RN: 07/16/1950 (69 y.o. Hessie Diener Primary Care Provider: Aura Dials Other Clinician: Referring Provider: Treating Provider/Extender: Gabriel Carina in Treatment: 1 Active Problems ICD-10 Encounter Code Description Active Date MDM Diagnosis L30.4 Erythema intertrigo 02/21/2020 No Yes L98.498 Non-pressure chronic ulcer of skin of other sites with other specified severity 02/21/2020 No Yes Inactive Problems Resolved Problems Electronic Signature(s) Signed: 02/28/2020 3:50:51 PM By: Linton Ham MD Entered By: Linton Ham on 02/28/2020 10:18:39 -------------------------------------------------------------------------------- Progress Note Details Patient Name: Date of Service: Alan Morton D. 02/28/2020 9:45 A M Medical Record Number: [id number] Patient Account Number: [id number] Date of Birth/Sex: Treating RN: 05/05/1950 (69 y.o. Hessie Diener Primary Care Provider: Aura Dials Other Clinician: Referring Provider: Treating Provider/Extender: Gabriel Carina in Treatment: 1 Subjective History of Present Illness (HPI) ADMISSION 02/21/2020 This is a  70 year old man with type 2 diabetes well controlled. For the last 3 months he has had a difficult painful/burning area in his coccyx extending between the gluteal cleft. He went to see his primary doctor who probably prescribed nystatin powder although they never managed to pick this up. They have been using something called [place] salve and paradoxically the areas gotten a lot better. There are small open areas but apparently one point these were quite a bit larger. Past medical history includes coronary artery disease, type 2 diabetes,o Rheumatoid arthritis, history of skin cancer in his left upper arm, asthma, CVA 12/23 he arrives in clinic not receiving any supplies from [place]. This is apparently tangled up and fed ex. I also ordered ketoconazole for him for the remaining intertrigo however he does not seem to have gotten this either. Objective Constitutional Vitals Time Taken: 9:15 AM, Height: 70 in, Weight: 204 lbs, BMI: 29.3, Temperature: 99.2 F, Pulse: 84 bpm, Respiratory Rate: 18 breaths/min, Blood Pressure: 169/84 mmHg. Integumentary (Hair, Skin) Wound #1 status is Open. Original cause of wound was Gradually Appeared. The wound is located on the Coccyx. The wound measures 4cm length x 2.2cm width x 0.1cm depth; 6.912cm^2 area and 0.691cm^3 volume. Assessment Active Problems ICD-10 Erythema intertrigo Non-pressure chronic ulcer of skin of other sites with other specified severity Plan Follow-up Appointments: Return Appointment in 1 week. Bathing/ Shower/ Hygiene: May shower and wash wound with soap and water. Off-Loading: Turn and reposition every 2 hours - while laying down. ensure while sitting to adjust/move to offload pressure to affected area. Additional Orders / Instructions: Other: - ketoconazole cream sent to your pharmacy. Pick it up from there. WOUND #1: - Coccyx Wound Laterality: Cleanser: Soap and Water 1 x Per Day/30 Days Discharge Instructions: May shower and  wash wound with dial antibacterial soap and water prior to dressing change. Topical: Ketoconazole Cream 2% 1 x Per Day/30 Days Discharge Instructions: Apply Ketoconazole to reddness and wound area daily under alginate silver. Prim Dressing: KerraCel Ag Gelling Fiber Dressing, 2x2 in (silver alginate) (Generic) 1 x Per Day/30 Days ary Discharge Instructions: Apply silver alginate over ketoconazole cream. Secondary Dressing: Woven Gauze Sponge, Non-Sterile 4x4 in (Generic) 1 x Per Day/30 Days Discharge Instructions: Apply over primary dressing as directed. Secured With:  71M Medipore H Soft Cloth Surgical T 4 x 2 (in/yd) (Generic) 1 x Per Day/30 Days ape Discharge Instructions: Secure dressing with tape as directed. 1. I think this area will clear up very nicely if he takes the pressure off this and keeps this clean and dry. 2. I think he can be discharged from the clinic next week Electronic Signature(s) Signed: 02/28/2020 3:50:51 PM By: Linton Ham MD Entered By: Linton Ham on 02/28/2020 10:25:21 -------------------------------------------------------------------------------- SuperBill Details Patient Name: Date of Service: Alan Wang, Alan Pray D. 02/28/2020 Medical Record Number: [id number] Patient Account Number: [id number] Date of Birth/Sex: Treating RN: [birth year] (69 y.o. Hessie Diener Primary Care Provider: Aura Dials Other Clinician: Referring Provider: Treating Provider/Extender: Gabriel Carina in Treatment: 1 Diagnosis Coding ICD-10 Codes Code Description L30.4 Erythema intertrigo L98.498 Non-pressure chronic ulcer of skin of other sites with other specified severity Facility Procedures CPT4 Code: [id number] Description: 00712 - WOUND CARE VISIT-LEV 3 EST PT Modifier: Quantity: 1 Physician Procedures : CPT4 Code Description Modifier [id number] - WC PHYS LEVEL 3 - EST PT ICD-10 Diagnosis Description L30.4 Erythema intertrigo L98.498  Non-pressure chronic ulcer of skin of other sites with other specified severity Quantity: 1 Electronic Signature(s) Signed: 02/28/2020 3:50:51 PM By: Linton Ham MD Entered By: Linton Ham on 02/28/2020 10:25:43

## 2020-02-28 NOTE — Progress Notes (Signed)
Alan Wang ([id number]) , Visit Report for 02/28/2020 Arrival Information Details Patient Name: Date of Service: Alan Morton D. 02/28/2020 9:45 A M Medical Record Number: [id number] Patient Account Number: [id number] Date of Birth/Sex: Treating RN: [birth year] (69 y.o. Alan Wang Primary Care : [employer] [employer] Other Clinician: Referring : Treating /Extender: Gabriel Carina in Treatment: 1 Visit Information History Since Last Visit Added or deleted any medications: No Patient Arrived: Ambulatory Any new allergies or adverse reactions: No Arrival Time: 09:15 Had a fall or experienced change in No Accompanied By: self activities of daily living that may affect Transfer Assistance: None risk of falls: Patient Identification Verified: Yes Signs or symptoms of abuse/neglect since last visito No Secondary Verification Process Completed: Yes Hospitalized since last visit: No Patient Requires Transmission-Based Precautions: No Implantable device outside of the clinic excluding No Patient Has Alerts: No cellular tissue based products placed in the center since last visit: Has Dressing in Place as Prescribed: Yes Pain Present Now: No Electronic Signature(s) Signed: 02/28/2020 3:41:52 PM By: Sandre Kitty Entered By: Sandre Kitty on 02/28/2020 09:15:22 -------------------------------------------------------------------------------- Clinic Level of Care Assessment Details Patient Name: Date of Service: Alan Morton D. 02/28/2020 9:45 A M Medical Record Number: [id number] Patient Account Number: [id number] Date of Birth/Sex: Treating RN: 1950/08/13 (69 y.o. Alan Wang Primary Care : [employer] Other Clinician: Referring : Treating /Extender: Gabriel Carina in Treatment: 1 Clinic Level of Care Assessment Items TOOL 4 Quantity Score X- 1 0 Use when only an  EandM is performed on FOLLOW-UP visit ASSESSMENTS - Nursing Assessment / Reassessment X- 1 10 Reassessment of Co-morbidities (includes updates in patient status) X- 1 5 Reassessment of Adherence to Treatment Plan ASSESSMENTS - Wound and Skin A ssessment / Reassessment X - Simple Wound Assessment / Reassessment - one wound 1 5 [] - 0 Complex Wound Assessment / Reassessment - multiple wounds X- 1 10 Dermatologic / Skin Assessment (not related to wound area) ASSESSMENTS - Focused Assessment [] - 0 Circumferential Edema Measurements - multi extremities X- 1 10 Nutritional Assessment / Counseling / Intervention [] - 0 Lower Extremity Assessment (monofilament, tuning fork, pulses) [] - 0 Peripheral Arterial Disease Assessment (using hand held doppler) ASSESSMENTS - Ostomy and/or Continence Assessment and Care [] - 0 Incontinence Assessment and Management [] - 0 Ostomy Care Assessment and Management (repouching, etc.) PROCESS - Coordination of Care X - Simple Patient / Family Education for ongoing care 1 15 [] - 0 Complex (extensive) Patient / Family Education for ongoing care X- 1 10 Staff obtains Programmer, systems, Records, T Results / Process Orders est [] - 0 Staff telephones HHA, Nursing Homes / Clarify orders / etc [] - 0 Routine Transfer to another Facility (non-emergent condition) [] - 0 Routine Hospital Admission (non-emergent condition) [] - 0 New Admissions / Biomedical engineer / Ordering NPWT Apligraf, etc. , [] - 0 Emergency Hospital Admission (emergent condition) X- 1 10 Simple Discharge Coordination [] - 0 Complex (extensive) Discharge Coordination PROCESS - Special Needs [] - 0 Pediatric / Minor Patient Management [] - 0 Isolation Patient Management [] - 0 Hearing / Language / Visual special needs [] - 0 Assessment of Community assistance (transportation, D/C planning, etc.) [] - 0 Additional assistance / Altered mentation [] - 0 Support Surface(s)  Assessment (bed, cushion, seat, etc.) INTERVENTIONS - Wound Cleansing / Measurement X - Simple Wound Cleansing - one wound 1 5 [] - 0 Complex Wound Cleansing -  Alan Wang ([id number]) , Visit Report for 02/28/2020 Arrival Information Details Patient Name: Date of Service: Alan Morton D. 02/28/2020 9:45 A M Medical Record Number: [id number] Patient Account Number: [id number] Date of Birth/Sex: Treating RN: [birth year] (69 y.o. Alan Wang Primary Care : [employer] Other Clinician: Referring : Treating /Extender: Gabriel Carina in Treatment: 1 Visit Information History Since Last Visit Added or deleted any medications: No Patient Arrived: Ambulatory Any new allergies or adverse reactions: No Arrival Time: 09:15 Had a fall or experienced change in No Accompanied By: self activities of daily living that may affect Transfer Assistance: None risk of falls: Patient Identification Verified: Yes Signs or symptoms of abuse/neglect since last visito No Secondary Verification Process Completed: Yes Hospitalized since last visit: No Patient Requires Transmission-Based Precautions: No Implantable device outside of the clinic excluding No Patient Has Alerts: No cellular tissue based products placed in the center since last visit: Has Dressing in Place as Prescribed: Yes Pain Present Now: No Electronic Signature(s) Signed: 02/28/2020 3:41:52 PM By: Sandre Kitty Entered By: Sandre Kitty on 02/28/2020 09:15:22 -------------------------------------------------------------------------------- Clinic Level of Care Assessment Details Patient Name: Date of Service: Alan Morton D. 02/28/2020 9:45 A M Medical Record Number: [id number] Patient Account Number: [id number] Date of Birth/Sex: Treating RN: 1950/08/13 (69 y.o. Alan Wang Primary Care : [employer] Other Clinician: Referring : Treating /Extender: Gabriel Carina in Treatment: 1 Clinic Level of Care Assessment Items TOOL 4 Quantity Score X- 1 0 Use when only an  EandM is performed on FOLLOW-UP visit ASSESSMENTS - Nursing Assessment / Reassessment X- 1 10 Reassessment of Co-morbidities (includes updates in patient status) X- 1 5 Reassessment of Adherence to Treatment Plan ASSESSMENTS - Wound and Skin A ssessment / Reassessment X - Simple Wound Assessment / Reassessment - one wound 1 5 [] - 0 Complex Wound Assessment / Reassessment - multiple wounds X- 1 10 Dermatologic / Skin Assessment (not related to wound area) ASSESSMENTS - Focused Assessment [] - 0 Circumferential Edema Measurements - multi extremities X- 1 10 Nutritional Assessment / Counseling / Intervention [] - 0 Lower Extremity Assessment (monofilament, tuning fork, pulses) [] - 0 Peripheral Arterial Disease Assessment (using hand held doppler) ASSESSMENTS - Ostomy and/or Continence Assessment and Care [] - 0 Incontinence Assessment and Management [] - 0 Ostomy Care Assessment and Management (repouching, etc.) PROCESS - Coordination of Care X - Simple Patient / Family Education for ongoing care 1 15 [] - 0 Complex (extensive) Patient / Family Education for ongoing care X- 1 10 Staff obtains Programmer, systems, Records, T Results / Process Orders est [] - 0 Staff telephones HHA, Nursing Homes / Clarify orders / etc [] - 0 Routine Transfer to another Facility (non-emergent condition) [] - 0 Routine Hospital Admission (non-emergent condition) [] - 0 New Admissions / Biomedical engineer / Ordering NPWT Apligraf, etc. , [] - 0 Emergency Hospital Admission (emergent condition) X- 1 10 Simple Discharge Coordination [] - 0 Complex (extensive) Discharge Coordination PROCESS - Special Needs [] - 0 Pediatric / Minor Patient Management [] - 0 Isolation Patient Management [] - 0 Hearing / Language / Visual special needs [] - 0 Assessment of Community assistance (transportation, D/C planning, etc.) [] - 0 Additional assistance / Altered mentation [] - 0 Support Surface(s)  Assessment (bed, cushion, seat, etc.) INTERVENTIONS - Wound Cleansing / Measurement X - Simple Wound Cleansing - one wound 1 5 [] - 0 Complex Wound Cleansing -  Alan Wang ([id number]) , Visit Report for 02/28/2020 Arrival Information Details Patient Name: Date of Service: Alan Morton D. 02/28/2020 9:45 A M Medical Record Number: [id number] Patient Account Number: [id number] Date of Birth/Sex: Treating RN: [birth year] (69 y.o. Alan Wang Primary Care : [employer] Other Clinician: Referring : Treating /Extender: Gabriel Carina in Treatment: 1 Visit Information History Since Last Visit Added or deleted any medications: No Patient Arrived: Ambulatory Any new allergies or adverse reactions: No Arrival Time: 09:15 Had a fall or experienced change in No Accompanied By: self activities of daily living that may affect Transfer Assistance: None risk of falls: Patient Identification Verified: Yes Signs or symptoms of abuse/neglect since last visito No Secondary Verification Process Completed: Yes Hospitalized since last visit: No Patient Requires Transmission-Based Precautions: No Implantable device outside of the clinic excluding No Patient Has Alerts: No cellular tissue based products placed in the center since last visit: Has Dressing in Place as Prescribed: Yes Pain Present Now: No Electronic Signature(s) Signed: 02/28/2020 3:41:52 PM By: Sandre Kitty Entered By: Sandre Kitty on 02/28/2020 09:15:22 -------------------------------------------------------------------------------- Clinic Level of Care Assessment Details Patient Name: Date of Service: Alan Morton D. 02/28/2020 9:45 A M Medical Record Number: [id number] Patient Account Number: [id number] Date of Birth/Sex: Treating RN: 1950/08/13 (69 y.o. Alan Wang Primary Care : [employer] Other Clinician: Referring : Treating /Extender: Gabriel Carina in Treatment: 1 Clinic Level of Care Assessment Items TOOL 4 Quantity Score X- 1 0 Use when only an  EandM is performed on FOLLOW-UP visit ASSESSMENTS - Nursing Assessment / Reassessment X- 1 10 Reassessment of Co-morbidities (includes updates in patient status) X- 1 5 Reassessment of Adherence to Treatment Plan ASSESSMENTS - Wound and Skin A ssessment / Reassessment X - Simple Wound Assessment / Reassessment - one wound 1 5 [] - 0 Complex Wound Assessment / Reassessment - multiple wounds X- 1 10 Dermatologic / Skin Assessment (not related to wound area) ASSESSMENTS - Focused Assessment [] - 0 Circumferential Edema Measurements - multi extremities X- 1 10 Nutritional Assessment / Counseling / Intervention [] - 0 Lower Extremity Assessment (monofilament, tuning fork, pulses) [] - 0 Peripheral Arterial Disease Assessment (using hand held doppler) ASSESSMENTS - Ostomy and/or Continence Assessment and Care [] - 0 Incontinence Assessment and Management [] - 0 Ostomy Care Assessment and Management (repouching, etc.) PROCESS - Coordination of Care X - Simple Patient / Family Education for ongoing care 1 15 [] - 0 Complex (extensive) Patient / Family Education for ongoing care X- 1 10 Staff obtains Programmer, systems, Records, T Results / Process Orders est [] - 0 Staff telephones HHA, Nursing Homes / Clarify orders / etc [] - 0 Routine Transfer to another Facility (non-emergent condition) [] - 0 Routine Hospital Admission (non-emergent condition) [] - 0 New Admissions / Biomedical engineer / Ordering NPWT Apligraf, etc. , [] - 0 Emergency Hospital Admission (emergent condition) X- 1 10 Simple Discharge Coordination [] - 0 Complex (extensive) Discharge Coordination PROCESS - Special Needs [] - 0 Pediatric / Minor Patient Management [] - 0 Isolation Patient Management [] - 0 Hearing / Language / Visual special needs [] - 0 Assessment of Community assistance (transportation, D/C planning, etc.) [] - 0 Additional assistance / Altered mentation [] - 0 Support Surface(s)  Assessment (bed, cushion, seat, etc.) INTERVENTIONS - Wound Cleansing / Measurement X - Simple Wound Cleansing - one wound 1 5 [] - 0 Complex Wound Cleansing -  multiple wounds X- 1 5 Wound Imaging (photographs - any number of wounds) [] - 0 Wound Tracing (instead of photographs) X- 1 5 Simple Wound Measurement - one wound [] - 0 Complex Wound Measurement - multiple wounds INTERVENTIONS - Wound Dressings X - Small Wound Dressing one or multiple wounds 1 10 [] - 0 Medium Wound Dressing one or multiple wounds [] - 0 Large Wound Dressing one or multiple wounds [] - 0 Application of Medications - topical [] - 0 Application of Medications - injection INTERVENTIONS - Miscellaneous [] - 0 External ear exam [] - 0 Specimen Collection (cultures, biopsies, blood, body fluids, etc.) [] - 0 Specimen(s) / Culture(s) sent or taken to Lab for analysis [] - 0 Patient Transfer (multiple staff / Civil Service fast streamer / Similar devices) [] - 0 Simple Staple / Suture removal (25 or less) [] - 0 Complex Staple / Suture removal (26 or more) [] - 0 Hypo / Hyperglycemic Management (close monitor of Blood Glucose) [] - 0 Ankle / Brachial Index (ABI) - do not check if billed separately X- 1 5 Vital Signs Has the patient been seen at the hospital within the last three years: Yes Total Score: 105 Level Of Care: New/Established - Level 3 Electronic Signature(s) Signed: 02/28/2020 4:19:56 PM By: Alan Wang Entered By: Alan Wang on 02/28/2020 10:10:41 -------------------------------------------------------------------------------- Encounter Discharge Information Details Patient Name: Date of Service: Alan Wang, Alan Pray D. 02/28/2020 9:45 A M Medical Record Number: [id number] Patient Account Number: [id number] Date of Birth/Sex: Treating RN: [birth year] (69 y.o. Alan Wang Primary Care : [employer] [employer] Other Clinician: Referring : Treating /Extender: Gabriel Carina in Treatment: 1 Encounter Discharge Information Items Discharge Condition: Stable Ambulatory Status: Ambulatory Discharge Destination: Home Transportation: Private Auto Accompanied By: self Schedule Follow-up Appointment: Yes Clinical Summary of Care: Electronic Signature(s) Signed: 02/28/2020 4:19:56 PM By: Alan Wang Entered By: Alan Wang on 02/28/2020 10:13:10 -------------------------------------------------------------------------------- Multi Wound Chart Details Patient Name: Date of Service: Alan Wang, Alan Pray D. 02/28/2020 9:45 A M Medical Record Number: [id number] Patient Account Number: [id number] Date of Birth/Sex: Treating RN: 05-25-1950 (69 y.o. Alan Wang Primary Care : Aura Dials Other Clinician: Referring : Treating /Extender: Gabriel Carina in Treatment: 1 Vital Signs Height(in): 70 Pulse(bpm): 54 Weight(lbs): 204 Blood Pressure(mmHg): 169/84 Body Mass Index(BMI): 29 Temperature(F): 99.2 Respiratory Rate(breaths/min): 18 Photos: [1:No Photos Coccyx] [N/A:N/A N/A] Wound Location: [1:Gradually Appeared] [N/A:N/A] Wounding Event: [1:Inflammatory] [N/A:N/A] Primary Etiology: [1:12/10/2019] [N/A:N/A] Date Acquired: [1:1] [N/A:N/A] Weeks of Treatment: [1:Open] [N/A:N/A] Wound Status: [1:4x2.2x0.1] [N/A:N/A] Measurements L x W x D (cm) [1:6.912] [N/A:N/A] A (cm) : rea [1:0.691] [N/A:N/A] Volume (cm) : [1:56.00%] [N/A:N/A] % Reduction in Area: [1:56.00%] [N/A:N/A] % Reduction in Volume: [1:Full Thickness Without Exposed] [N/A:N/A] Classification: [1:Support Structures] Treatment Notes Wound #1 (Coccyx) Cleanser Soap and Water Discharge Instruction: May shower and wash wound with dial antibacterial soap and water prior to dressing change. Peri-Wound Care Topical Ketoconazole Cream 2% Discharge Instruction: Apply Ketoconazole to reddness and wound area daily under  alginate silver. Primary Dressing KerraCel Ag Gelling Fiber Dressing, 2x2 in (silver alginate) Discharge Instruction: Apply silver alginate over ketoconazole cream. Secondary Dressing Woven Gauze Sponge, Non-Sterile 4x4 in Discharge Instruction: Apply over primary dressing as directed. Secured With 37M Medipore H Soft Cloth Surgical T 4 x 2 (in/yd) ape Discharge Instruction: Secure dressing with tape as directed. Compression Wrap Compression Stockings Add-Ons Notes per patient did not pick up ketoconazole from pharmacy as directed last week.

## 2020-03-05 ENCOUNTER — Ambulatory Visit: Payer: Medicare Other | Admitting: Cardiology

## 2020-03-06 DIAGNOSIS — M19049 Primary osteoarthritis, unspecified hand: Secondary | ICD-10-CM | POA: Diagnosis not present

## 2020-03-06 DIAGNOSIS — N401 Enlarged prostate with lower urinary tract symptoms: Secondary | ICD-10-CM | POA: Diagnosis not present

## 2020-03-06 DIAGNOSIS — I2510 Atherosclerotic heart disease of native coronary artery without angina pectoris: Secondary | ICD-10-CM | POA: Diagnosis not present

## 2020-03-06 DIAGNOSIS — I252 Old myocardial infarction: Secondary | ICD-10-CM | POA: Diagnosis not present

## 2020-03-06 DIAGNOSIS — M150 Primary generalized (osteo)arthritis: Secondary | ICD-10-CM | POA: Diagnosis not present

## 2020-03-06 DIAGNOSIS — M179 Osteoarthritis of knee, unspecified: Secondary | ICD-10-CM | POA: Diagnosis not present

## 2020-03-06 DIAGNOSIS — I10 Essential (primary) hypertension: Secondary | ICD-10-CM | POA: Diagnosis not present

## 2020-03-06 DIAGNOSIS — E785 Hyperlipidemia, unspecified: Secondary | ICD-10-CM | POA: Diagnosis not present

## 2020-03-06 DIAGNOSIS — N400 Enlarged prostate without lower urinary tract symptoms: Secondary | ICD-10-CM | POA: Diagnosis not present

## 2020-03-06 DIAGNOSIS — E119 Type 2 diabetes mellitus without complications: Secondary | ICD-10-CM | POA: Diagnosis not present

## 2020-03-09 ENCOUNTER — Other Ambulatory Visit: Payer: Self-pay | Admitting: Physician Assistant

## 2020-03-10 ENCOUNTER — Other Ambulatory Visit: Payer: Medicare Other

## 2020-03-10 NOTE — Telephone Encounter (Addendum)
Last Visit: 11/15/2019 Next Visit: 04/23/2020 Labs: 10/26/2019 CBC and CMP are stable.  Current Dose per office note on 11/15/2019: Sulfasalazine 1000 mg twice daily Dx:  Inflammatory arthritis  Left message to advise patient he is due to update labs.  Okay to refill 30 day supply SSZ?

## 2020-03-10 NOTE — Telephone Encounter (Signed)
Patient requires updated lab work ASAP prior to refilling SSZ.  His lab work was due in November.

## 2020-03-11 DIAGNOSIS — R051 Acute cough: Secondary | ICD-10-CM | POA: Diagnosis not present

## 2020-03-11 DIAGNOSIS — Z20822 Contact with and (suspected) exposure to covid-19: Secondary | ICD-10-CM | POA: Diagnosis not present

## 2020-03-11 DIAGNOSIS — R509 Fever, unspecified: Secondary | ICD-10-CM | POA: Diagnosis not present

## 2020-03-11 DIAGNOSIS — R5383 Other fatigue: Secondary | ICD-10-CM | POA: Diagnosis not present

## 2020-04-01 ENCOUNTER — Other Ambulatory Visit: Payer: Self-pay | Admitting: *Deleted

## 2020-04-01 ENCOUNTER — Other Ambulatory Visit: Payer: Self-pay

## 2020-04-01 DIAGNOSIS — Z79899 Other long term (current) drug therapy: Secondary | ICD-10-CM

## 2020-04-01 MED ORDER — SULFASALAZINE 500 MG PO TABS
ORAL_TABLET | ORAL | 0 refills | Status: DC
Start: 2020-04-01 — End: 2020-06-24

## 2020-04-01 NOTE — Telephone Encounter (Signed)
Patient would like these prescriptions sent to [facility].

## 2020-04-01 NOTE — Telephone Encounter (Signed)
Last Visit:11/15/2019 Next Visit: 04/23/2020 UDS: 11/15/2019, UDS is negative. Patient takes tramadol only occasionally. Narc Agreement: 11/14/2019 Labs:  03/22/2020, pending  Office visit:  11/15/2019, Sulfasalazine 1000 mg twice daily,   tramadol 50 mg 1 tablet by mouth at bedtime as needed for pain relief.  DX:  Inflammatory arthritis-patient had no synovitis on my examination and chronic pain   Last Fill: 11/15/2019  OK to R/F:  Sulfasalazine and Tramadol?

## 2020-04-01 NOTE — Telephone Encounter (Signed)
Patient is at the office for labwork and requested prescription refills of Tramadol and Sulfasalazine.

## 2020-04-01 NOTE — Telephone Encounter (Signed)
We cannot prescribe tramadol.  Patient has been getting it from other provider for the last 2 refills.  I will refill sulfasalazine.

## 2020-04-02 LAB — CBC WITH DIFFERENTIAL/PLATELET
Absolute Monocytes: 934 cells/uL (ref 200–950)
Basophils Absolute: 51 cells/uL (ref 0–200)
Basophils Relative: 0.7 %
Eosinophils Absolute: 110 cells/uL (ref 15–500)
Eosinophils Relative: 1.5 %
HCT: 48.6 % (ref 38.5–50.0)
Hemoglobin: 16.2 g/dL (ref 13.2–17.1)
Lymphs Abs: 3073 cells/uL (ref 850–3900)
MCH: 31.3 pg (ref 27.0–33.0)
MCHC: 33.3 g/dL (ref 32.0–36.0)
MCV: 94 fL (ref 80.0–100.0)
MPV: 9.3 fL (ref 7.5–12.5)
Monocytes Relative: 12.8 %
Neutro Abs: 3132 cells/uL (ref 1500–7800)
Neutrophils Relative %: 42.9 %
Platelets: 216 10*3/uL (ref 140–400)
RBC: 5.17 10*6/uL (ref 4.20–5.80)
RDW: 13.1 % (ref 11.0–15.0)
Total Lymphocyte: 42.1 %
WBC: 7.3 10*3/uL (ref 3.8–10.8)

## 2020-04-02 LAB — COMPLETE METABOLIC PANEL WITH GFR
AG Ratio: 1.5 (calc) (ref 1.0–2.5)
ALT: 23 U/L (ref 9–46)
AST: 25 U/L (ref 10–35)
Albumin: 3.9 g/dL (ref 3.6–5.1)
Alkaline phosphatase (APISO): 78 U/L (ref 35–144)
BUN: 14 mg/dL (ref 7–25)
CO2: 25 mmol/L (ref 20–32)
Calcium: 9.4 mg/dL (ref 8.6–10.3)
Chloride: 104 mmol/L (ref 98–110)
Creat: 1.05 mg/dL (ref 0.70–1.18)
GFR, Est African American: 82 mL/min/{1.73_m2} (ref >=60)
GFR, Est Non African American: 71 mL/min/{1.73_m2} (ref >=60)
Globulin: 2.6 g/dL (calc) (ref 1.9–3.7)
Glucose, Bld: 130 mg/dL — ABNORMAL HIGH (ref 65–99)
Potassium: 4.8 mmol/L (ref 3.5–5.3)
Sodium: 141 mmol/L (ref 135–146)
Total Bilirubin: 0.3 mg/dL (ref 0.2–1.2)
Total Protein: 6.5 g/dL (ref 6.1–8.1)

## 2020-04-02 NOTE — Telephone Encounter (Signed)
Tramadol discontinued in patient's chart, Alan Wang for patient to call office to discuss.

## 2020-04-02 NOTE — Addendum Note (Signed)
Addended by: Shona Needles on: 04/02/2020 08:59 AM   Modules accepted: Orders

## 2020-04-02 NOTE — Telephone Encounter (Signed)
Patient advised as he has been filling the prescription with another doctor we will no longer fill the Tramadol. Patient expressed understanding.

## 2020-04-04 ENCOUNTER — Other Ambulatory Visit: Payer: Self-pay | Admitting: Rheumatology

## 2020-04-10 NOTE — Progress Notes (Addendum)
Office Visit Note  Patient: Alan Wang             Date of Birth: Sep 19, 1950           MRN: [id number]             PCP: Aura Dials, MD Referring: Aura Dials, MD Visit Date: 04/23/2020 Occupation: @GUAROCC @  Subjective:  Left knee pain  History of Present Illness: Alan Wang is a 70 y.o. male with history of inflammatory arthritis and osteoarthritis overlap. He continues to have some discomfort in all of his joints. He states that his left hip joint continues to cause some discomfort. He has difficulty walking at times. He has not noticed any joint swelling since he has been on sulfasalazine. He states in December he developed COVID-19 virus infection and did not require any treatment on hospitalization.  Activities of Daily Living:  Patient reports morning stiffness for 10 minutes.   Patient Denies nocturnal pain.  Difficulty dressing/grooming: Denies Difficulty climbing stairs: Denies Difficulty getting out of chair: Reports Difficulty using hands for taps, buttons, cutlery, and/or writing: Reports  Review of Systems  Constitutional: Positive for fatigue. Negative for night sweats.  HENT: Positive for mouth dryness. Negative for mouth sores and nose dryness.   Eyes: Negative for redness and dryness.  Respiratory: Positive for shortness of breath. Negative for difficulty breathing.   Cardiovascular: Negative for chest pain, palpitations, hypertension, irregular heartbeat and swelling in legs/feet.  Gastrointestinal: Positive for constipation and diarrhea.  Endocrine: Positive for excessive thirst and increased urination.  Genitourinary: Negative for difficulty urinating.  Musculoskeletal: Positive for arthralgias, joint pain, muscle weakness and morning stiffness. Negative for gait problem, joint swelling, myalgias, muscle tenderness and myalgias.  Skin: Negative for color change, rash, hair loss, nodules/bumps, skin tightness, ulcers and sensitivity to sunlight.   Allergic/Immunologic: Negative for susceptible to infections.  Neurological: Positive for weakness. Negative for dizziness, fainting, memory loss and night sweats.  Hematological: Negative for bruising/bleeding tendency and swollen glands.  Psychiatric/Behavioral: Positive for sleep disturbance. Negative for depressed mood. The patient is not nervous/anxious.     PMFS History:  Patient Active Problem List   Diagnosis Date Noted  . TIA (transient ischemic attack) 06/22/2016  . Type 2 diabetes mellitus without complication ([place]) 123456  . Primary osteoarthritis of both hands 06/03/2016  . Primary osteoarthritis of both knees 06/03/2016  . Generalized pain 06/03/2016  . Memory difficulties 06/03/2016  . History of diabetes mellitus 06/03/2016  . History of coronary artery disease 06/03/2016  . History of hypertension 06/03/2016  . History of hypercholesterolemia 06/03/2016  . High risk medication use 06/03/2016  . History of high cholesterol 06/03/2016  . ANA positive 06/03/2016  . Inflammatory arthritis 10/29/2015  . Vitamin D deficiency 10/29/2015  . Fatigue 09/24/2015  . MGUS (monoclonal gammopathy of unknown significance) 09/22/2015  . Vertigo 10/31/2013  . Chronic cough 11/16/2012  . CAD (coronary artery disease), native coronary artery 03/08/2011  . Postsurgical percutaneous transluminal coronary angioplasty (PTCA) status 03/08/2011  . Type II or unspecified type diabetes mellitus with other coma, not stated as uncontrolled 03/08/2011  . Hypertension associated with diabetes ([place]) 03/08/2011  . Shortness of breath 03/08/2011  . Mixed hyperlipidemia 03/08/2011    Past Medical History:  Diagnosis Date  . Arthritis   . Asthma   . Coronary artery disease   . COVID   . Diabetes mellitus    type 2, insulin dependent for 30+ years  . History of kidney stones  11/2012  . Hyperlipemia   . Hypertension   . IDDM (insulin dependent diabetes mellitus)   . Shortness of breath    . Stroke ([place])    hx TIA    Family History  Problem Relation Age of Onset  . Emphysema Mother   . COPD Mother   . Alzheimer's disease Father   . Asthma Paternal Aunt   . Heart attack Paternal Grandfather    Past Surgical History:  Procedure Laterality Date  . ANGIOPLASTY  04/06/2011   x3   . CARDIAC CATHETERIZATION    . CARDIAC CATHETERIZATION N/A 09/16/2015   Procedure: Left Heart Cath and Coronary Angiography;  Surgeon: Adrian Prows, MD;  Location: [facility];  Service: Cardiovascular;  Laterality: N/A;  . cataracts    . CHOLECYSTECTOMY    . HERNIA REPAIR     x2  . KNEE ARTHROSCOPY    . LEFT HEART CATHETERIZATION WITH CORONARY ANGIOGRAM N/A 03/08/2011   Procedure: LEFT HEART CATHETERIZATION WITH CORONARY ANGIOGRAM;  Surgeon: Laverda Page, MD;  Location: [facility] C[facility] LAB;  Service: Cardiovascular;  Laterality: N/A;  . PERCUTANEOUS CORONARY STENT INTERVENTION (PCI-S) N/A 04/06/2011   Procedure: PERCUTANEOUS CORONARY STENT INTERVENTION (PCI-S);  Surgeon: Laverda Page, MD;  Location: [facility] CATH LAB;  Service: Cardiovascular;  Laterality: N/A;   Social History   Social History Narrative   Patient lives at home with his spouse.   Caffeine Use: occasionally   Immunization History  Administered Date(s) Administered  . PFIZER(Purple Top)SARS-COV-2 Vaccination 04/02/2019, 04/23/2019  . Pneumococcal Polysaccharide-23 12/14/2011     Objective: Vital Signs: BP 112/74 (BP Location: Right Arm, Patient Position: Sitting, Cuff Size: Small)   Pulse 72   Resp 12   Ht 5\' 10"  (1.778 m)   Wt 203 lb (92.1 kg)   BMI 29.13 kg/m    Physical Exam Vitals and nursing note reviewed.  Constitutional:      Appearance: He is well-developed and well-nourished.  HENT:     Head: Normocephalic and atraumatic.  Eyes:     Extraocular Movements: EOM normal.     Conjunctiva/sclera: Conjunctivae normal.     Pupils: Pupils are equal, round, and reactive to light.  Cardiovascular:     Rate  and Rhythm: Normal rate and regular rhythm.     Heart sounds: Normal heart sounds.  Pulmonary:     Effort: Pulmonary effort is normal.     Breath sounds: Normal breath sounds.  Abdominal:     General: Bowel sounds are normal.     Palpations: Abdomen is soft.  Musculoskeletal:     Cervical back: Normal range of motion and neck supple.  Skin:    General: Skin is warm and dry.     Capillary Refill: Capillary refill takes less than 2 seconds.  Neurological:     Mental Status: He is alert and oriented to person, place, and time.  Psychiatric:        Mood and Affect: Mood and affect normal.        Behavior: Behavior normal.      Musculoskeletal Exam: C-spine was in good range of motion. Shoulder joints, elbow joints, wrist joints with good range of motion. He had bilateral PIP and DIP thickening but no synovitis was noted. There was no synovitis over MCPs or wrist joints. Hip joints with good range of motion. Knee joints with good range of motion without any warmth swelling or effusion. There was no tenderness over ankles or MTPs.  CDAI Exam: CDAI  Score: -- Patient Global: --; Provider Global: -- Swollen: --; Tender: -- Joint Exam 04/23/2020   No joint exam has been documented for this visit   There is currently no information documented on the homunculus. Go to the Rheumatology activity and complete the homunculus joint exam.  Investigation: No additional findings.  Imaging: No results found.  Recent Labs: Lab Results  Component Value Date   WBC 7.3 04/01/2020   HGB 16.2 04/01/2020   PLT 216 04/01/2020   NA 141 04/01/2020   K 4.8 04/01/2020   CL 104 04/01/2020   CO2 25 04/01/2020   GLUCOSE 130 (H) 04/01/2020   BUN 14 04/01/2020   CREATININE 1.05 04/01/2020   BILITOT 0.3 04/01/2020   ALKPHOS 76 10/26/2016   AST 25 04/01/2020   ALT 23 04/01/2020   PROT 6.5 04/01/2020   ALBUMIN 4.2 10/26/2016   CALCIUM 9.4 04/01/2020   GFRAA 82 04/01/2020    Speciality Comments:  No specialty comments available.  Procedures:  No procedures performed Allergies: Avandia [rosiglitazone maleate]   Assessment / Plan:     Visit Diagnoses: Inflammatory arthritis-no synovitis was noted on my examination today. He has been tolerating sulfasalazine well.  High risk medication use - Sulfasalazine 1000 mg twice daily. His labs in January were within normal limits. Have advised him to get labs every 3 months.  Primary osteoarthritis of both hands-he has bilateral PIP and DIP thickening with the no synovitis.  Primary osteoarthritis of both knees-he has some discomfort in his knee joints especially his left knee. No warmth swelling effusion was noted.  Other chronic pain - another provider has taken over tramadol prescription.   History of vitamin D deficiency-he is on vitamin D supplement.  MGUS (monoclonal gammopathy of unknown significance)-followed by hematology.  History of hypertension-blood pressure is normal today.  Other medical problems are listed as follows:  History of hypercholesterolemia  History of arthroscopy of left knee  History of coronary artery disease  History of TIA (transient ischemic attack)  History of diabetes mellitus  COVID-19 virus infection-he had infection in December 2021. He had 2 initial doses of the COVID-19 vaccination but does not want to get the booster. The risk of not getting the booster were discussed. Use of hand hygiene, mask and social distancing was discussed.  Orders: No orders of the defined types were placed in this encounter.  No orders of the defined types were placed in this encounter.   Follow-Up Instructions: Return in about 6 months (around 10/21/2020) for inflammatory arthritis.   Bo Merino, MD  Note - This record has been created using Editor, commissioning.  Chart creation errors have been sought, but may not always  have been located. Such creation errors do not reflect on  the standard of medical  care.

## 2020-04-15 DIAGNOSIS — E119 Type 2 diabetes mellitus without complications: Secondary | ICD-10-CM | POA: Diagnosis not present

## 2020-04-15 DIAGNOSIS — M179 Osteoarthritis of knee, unspecified: Secondary | ICD-10-CM | POA: Diagnosis not present

## 2020-04-15 DIAGNOSIS — M150 Primary generalized (osteo)arthritis: Secondary | ICD-10-CM | POA: Diagnosis not present

## 2020-04-15 DIAGNOSIS — I2510 Atherosclerotic heart disease of native coronary artery without angina pectoris: Secondary | ICD-10-CM | POA: Diagnosis not present

## 2020-04-15 DIAGNOSIS — I252 Old myocardial infarction: Secondary | ICD-10-CM | POA: Diagnosis not present

## 2020-04-15 DIAGNOSIS — N400 Enlarged prostate without lower urinary tract symptoms: Secondary | ICD-10-CM | POA: Diagnosis not present

## 2020-04-15 DIAGNOSIS — N401 Enlarged prostate with lower urinary tract symptoms: Secondary | ICD-10-CM | POA: Diagnosis not present

## 2020-04-15 DIAGNOSIS — I10 Essential (primary) hypertension: Secondary | ICD-10-CM | POA: Diagnosis not present

## 2020-04-15 DIAGNOSIS — E785 Hyperlipidemia, unspecified: Secondary | ICD-10-CM | POA: Diagnosis not present

## 2020-04-15 DIAGNOSIS — M19049 Primary osteoarthritis, unspecified hand: Secondary | ICD-10-CM | POA: Diagnosis not present

## 2020-04-16 ENCOUNTER — Encounter (HOSPITAL_BASED_OUTPATIENT_CLINIC_OR_DEPARTMENT_OTHER): Payer: Medicare HMO | Attending: Physician Assistant | Admitting: Physician Assistant

## 2020-04-16 ENCOUNTER — Other Ambulatory Visit: Payer: Self-pay

## 2020-04-16 DIAGNOSIS — L304 Erythema intertrigo: Secondary | ICD-10-CM | POA: Diagnosis not present

## 2020-04-16 DIAGNOSIS — E1151 Type 2 diabetes mellitus with diabetic peripheral angiopathy without gangrene: Secondary | ICD-10-CM | POA: Insufficient documentation

## 2020-04-16 DIAGNOSIS — Z8673 Personal history of transient ischemic attack (TIA), and cerebral infarction without residual deficits: Secondary | ICD-10-CM | POA: Insufficient documentation

## 2020-04-16 DIAGNOSIS — M069 Rheumatoid arthritis, unspecified: Secondary | ICD-10-CM | POA: Diagnosis not present

## 2020-04-16 DIAGNOSIS — L98498 Non-pressure chronic ulcer of skin of other sites with other specified severity: Secondary | ICD-10-CM | POA: Diagnosis not present

## 2020-04-16 DIAGNOSIS — E11622 Type 2 diabetes mellitus with other skin ulcer: Secondary | ICD-10-CM | POA: Insufficient documentation

## 2020-04-16 DIAGNOSIS — L98499 Non-pressure chronic ulcer of skin of other sites with unspecified severity: Secondary | ICD-10-CM | POA: Diagnosis not present

## 2020-04-16 NOTE — Progress Notes (Signed)
Cleansing - multiple wounds X- 1 5 Wound Imaging (photographs - any number of wounds) [] - 0 Wound Tracing (instead of photographs) X- 1 5 Simple Wound Measurement - one wound [] - 0 Complex Wound Measurement - multiple wounds INTERVENTIONS - Wound Dressings [] - 0 Small Wound Dressing one or multiple wounds [] - 0 Medium Wound Dressing one or multiple wounds [] - 0 Large Wound Dressing one or multiple wounds X- 1 5 Application of Medications - topical [] - 0 Application of Medications - injection INTERVENTIONS - Miscellaneous [] - 0 External ear exam [] - 0 Specimen Collection (cultures, biopsies, blood, body fluids, etc.) [] - 0 Specimen(s) / Culture(s) sent or taken to Lab for analysis [] - 0 Patient Transfer (multiple staff / [employer] fast streamer / Similar devices) [] - 0 Simple Staple / Suture removal (25 or less) [] - 0 Complex Staple / Suture removal (26 or more) [] - 0 Hypo / Hyperglycemic Management (close monitor of Blood Glucose) [] - 0 Ankle / Brachial Index (ABI) - do not check if billed separately X- 1 5 Vital Signs Has the patient been seen at the hospital within the last three years: Yes Total Score: 85 Level Of Care: New/Established - Level 3 Electronic Signature(s) Signed: 04/16/2020 5:10:26 PM By: Alan Gouty RN, BSN Entered By: Alan Wang on 04/16/2020 12:58:05 -------------------------------------------------------------------------------- Encounter Discharge Information Details Patient Name: Date of Service: Alan Wang, Alan Pray D. 04/16/2020 12:30 PM Medical Record Number: [id number] Patient Account Number: [id number] Date of Birth/Sex: Treating RN: [birth year] (70 y.o. Alan Wang Primary Care : Alan Wang Other Clinician: Referring : Treating /Extender: Alan Wang in Treatment: 7 Encounter Discharge Information Items Discharge Condition: Stable Ambulatory Status: Ambulatory Discharge Destination: Home Transportation: Private Auto Accompanied By: wife Schedule Follow-up Appointment: Yes Clinical Summary of Care: Patient Declined Electronic Signature(s) Signed: 04/16/2020 5:02:07 PM By: Alan Hammock RN Entered By: Alan Wang on 04/16/2020 13:13:59 -------------------------------------------------------------------------------- [place] Details Patient Name: Date of Service: Alan Wang, Alan Pray D. 04/16/2020 12:30 PM Medical Record Number: [id number] Patient Account Number: [id number] Date of Birth/Sex: Treating RN: 05/15/1950 (70 y.o. [name] Primary Care : Alan Wang Other Clinician: Referring : Treating /Extender: Alan Wang in Treatment: 7 Active Inactive Abuse / Safety / Falls / Self Care Management Nursing Diagnoses: Potential for falls Goals: Patient will not experience any injury related to falls Date Initiated: 02/21/2020 Target Resolution Date: 05/02/2020 Goal Status: Active Patient/caregiver will verbalize/demonstrate measure taken to improve self care Date Initiated: 02/21/2020 Date Inactivated: 04/16/2020 Target Resolution Date: 03/14/2020 Goal Status: Met Interventions: Provide education on basic hygiene Provide education on fall prevention Notes: Pain, Acute or Chronic Nursing Diagnoses: Pain, acute or chronic: actual or potential Potential alteration in comfort, pain Goals: Patient will verbalize adequate pain control and receive pain control interventions during procedures as needed Date Initiated: 02/21/2020 Target Resolution Date: 05/14/2020 Goal Status: Active Patient/caregiver will verbalize comfort level met Date Initiated: 02/21/2020 Target Resolution Date: 05/14/2020 Goal Status:  Active Interventions: Encourage patient to take pain medications as prescribed Provide education on pain management Treatment Activities: Administer pain control measures as ordered : 02/21/2020 Notes: Wound/Skin Impairment Nursing Diagnoses: Knowledge deficit related to ulceration/compromised skin integrity Goals: Patient/caregiver will verbalize understanding of skin care regimen Date Initiated: 02/21/2020 Target Resolution Date: 05/14/2020 Goal Status: Active Interventions: Assess patient/caregiver ability to obtain necessary supplies Assess patient/caregiver ability to perform ulcer/skin care regimen upon admission and as  Cleansing - multiple wounds X- 1 5 Wound Imaging (photographs - any number of wounds) [] - 0 Wound Tracing (instead of photographs) X- 1 5 Simple Wound Measurement - one wound [] - 0 Complex Wound Measurement - multiple wounds INTERVENTIONS - Wound Dressings [] - 0 Small Wound Dressing one or multiple wounds [] - 0 Medium Wound Dressing one or multiple wounds [] - 0 Large Wound Dressing one or multiple wounds X- 1 5 Application of Medications - topical [] - 0 Application of Medications - injection INTERVENTIONS - Miscellaneous [] - 0 External ear exam [] - 0 Specimen Collection (cultures, biopsies, blood, body fluids, etc.) [] - 0 Specimen(s) / Culture(s) sent or taken to Lab for analysis [] - 0 Patient Transfer (multiple staff / [employer] fast streamer / Similar devices) [] - 0 Simple Staple / Suture removal (25 or less) [] - 0 Complex Staple / Suture removal (26 or more) [] - 0 Hypo / Hyperglycemic Management (close monitor of Blood Glucose) [] - 0 Ankle / Brachial Index (ABI) - do not check if billed separately X- 1 5 Vital Signs Has the patient been seen at the hospital within the last three years: Yes Total Score: 85 Level Of Care: New/Established - Level 3 Electronic Signature(s) Signed: 04/16/2020 5:10:26 PM By: Alan Gouty RN, BSN Entered By: Alan Wang on 04/16/2020 12:58:05 -------------------------------------------------------------------------------- Encounter Discharge Information Details Patient Name: Date of Service: Alan Wang, Alan Pray D. 04/16/2020 12:30 PM Medical Record Number: [id number] Patient Account Number: [id number] Date of Birth/Sex: Treating RN: 1950-04-11 (70 y.o. Alan Wang Primary Care : Alan Wang Other Clinician: Referring : Treating /Extender: Alan Wang in Treatment: 7 Encounter Discharge Information Items Discharge Condition: Stable Ambulatory Status: Ambulatory Discharge Destination: Home Transportation: Private Auto Accompanied By: wife Schedule Follow-up Appointment: Yes Clinical Summary of Care: Patient Declined Electronic Signature(s) Signed: 04/16/2020 5:02:07 PM By: Alan Hammock RN Entered By: Alan Wang on 04/16/2020 13:13:59 -------------------------------------------------------------------------------- [place] Details Patient Name: Date of Service: Alan Wang, Alan Pray D. 04/16/2020 12:30 PM Medical Record Number: [id number] Patient Account Number: [id number] Date of Birth/Sex: Treating RN: [birth year] (70 y.o. [name] Primary Care : Alan Wang Other Clinician: Referring : Treating /Extender: Alan Wang in Treatment: 7 Active Inactive Abuse / Safety / Falls / Self Care Management Nursing Diagnoses: Potential for falls Goals: Patient will not experience any injury related to falls Date Initiated: 02/21/2020 Target Resolution Date: 05/02/2020 Goal Status: Active Patient/caregiver will verbalize/demonstrate measure taken to improve self care Date Initiated: 02/21/2020 Date Inactivated: 04/16/2020 Target Resolution Date: 03/14/2020 Goal Status: Met Interventions: Provide education on basic hygiene Provide education on fall prevention Notes: Pain, Acute or Chronic Nursing Diagnoses: Pain, acute or chronic: actual or potential Potential alteration in comfort, pain Goals: Patient will verbalize adequate pain control and receive pain control interventions during procedures as needed Date Initiated: 02/21/2020 Target Resolution Date: 05/14/2020 Goal Status: Active Patient/caregiver will verbalize comfort level met Date Initiated: 02/21/2020 Target Resolution Date: 05/14/2020 Goal Status:  Active Interventions: Encourage patient to take pain medications as prescribed Provide education on pain management Treatment Activities: Administer pain control measures as ordered : 02/21/2020 Notes: Wound/Skin Impairment Nursing Diagnoses: Knowledge deficit related to ulceration/compromised skin integrity Goals: Patient/caregiver will verbalize understanding of skin care regimen Date Initiated: 02/21/2020 Target Resolution Date: 05/14/2020 Goal Status: Active Interventions: Assess patient/caregiver ability to obtain necessary supplies Assess patient/caregiver ability to perform ulcer/skin care regimen upon admission and as  Cleansing - multiple wounds X- 1 5 Wound Imaging (photographs - any number of wounds) [] - 0 Wound Tracing (instead of photographs) X- 1 5 Simple Wound Measurement - one wound [] - 0 Complex Wound Measurement - multiple wounds INTERVENTIONS - Wound Dressings [] - 0 Small Wound Dressing one or multiple wounds [] - 0 Medium Wound Dressing one or multiple wounds [] - 0 Large Wound Dressing one or multiple wounds X- 1 5 Application of Medications - topical [] - 0 Application of Medications - injection INTERVENTIONS - Miscellaneous [] - 0 External ear exam [] - 0 Specimen Collection (cultures, biopsies, blood, body fluids, etc.) [] - 0 Specimen(s) / Culture(s) sent or taken to Lab for analysis [] - 0 Patient Transfer (multiple staff / [employer] fast streamer / Similar devices) [] - 0 Simple Staple / Suture removal (25 or less) [] - 0 Complex Staple / Suture removal (26 or more) [] - 0 Hypo / Hyperglycemic Management (close monitor of Blood Glucose) [] - 0 Ankle / Brachial Index (ABI) - do not check if billed separately X- 1 5 Vital Signs Has the patient been seen at the hospital within the last three years: Yes Total Score: 85 Level Of Care: New/Established - Level 3 Electronic Signature(s) Signed: 04/16/2020 5:10:26 PM By: Alan Gouty RN, BSN Entered By: Alan Wang on 04/16/2020 12:58:05 -------------------------------------------------------------------------------- Encounter Discharge Information Details Patient Name: Date of Service: Alan Wang, Alan Pray D. 04/16/2020 12:30 PM Medical Record Number: [id number] Patient Account Number: [id number] Date of Birth/Sex: Treating RN: 1950-04-11 (70 y.o. Alan Wang Primary Care : Alan Wang Other Clinician: Referring : Treating /Extender: Alan Wang in Treatment: 7 Encounter Discharge Information Items Discharge Condition: Stable Ambulatory Status: Ambulatory Discharge Destination: Home Transportation: Private Auto Accompanied By: wife Schedule Follow-up Appointment: Yes Clinical Summary of Care: Patient Declined Electronic Signature(s) Signed: 04/16/2020 5:02:07 PM By: Alan Hammock RN Entered By: Alan Wang on 04/16/2020 13:13:59 -------------------------------------------------------------------------------- [place] Details Patient Name: Date of Service: Alan Wang, Alan Pray D. 04/16/2020 12:30 PM Medical Record Number: [id number] Patient Account Number: [id number] Date of Birth/Sex: Treating RN: [birth year] (70 y.o. [name] Primary Care : Alan Wang Other Clinician: Referring : Treating /Extender: Alan Wang in Treatment: 7 Active Inactive Abuse / Safety / Falls / Self Care Management Nursing Diagnoses: Potential for falls Goals: Patient will not experience any injury related to falls Date Initiated: 02/21/2020 Target Resolution Date: 05/02/2020 Goal Status: Active Patient/caregiver will verbalize/demonstrate measure taken to improve self care Date Initiated: 02/21/2020 Date Inactivated: 04/16/2020 Target Resolution Date: 03/14/2020 Goal Status: Met Interventions: Provide education on basic hygiene Provide education on fall prevention Notes: Pain, Acute or Chronic Nursing Diagnoses: Pain, acute or chronic: actual or potential Potential alteration in comfort, pain Goals: Patient will verbalize adequate pain control and receive pain control interventions during procedures as needed Date Initiated: 02/21/2020 Target Resolution Date: 05/14/2020 Goal Status: Active Patient/caregiver will verbalize comfort level met Date Initiated: 02/21/2020 Target Resolution Date: 05/14/2020 Goal Status:  Active Interventions: Encourage patient to take pain medications as prescribed Provide education on pain management Treatment Activities: Administer pain control measures as ordered : 02/21/2020 Notes: Wound/Skin Impairment Nursing Diagnoses: Knowledge deficit related to ulceration/compromised skin integrity Goals: Patient/caregiver will verbalize understanding of skin care regimen Date Initiated: 02/21/2020 Target Resolution Date: 05/14/2020 Goal Status: Active Interventions: Assess patient/caregiver ability to obtain necessary supplies Assess patient/caregiver ability to perform ulcer/skin care regimen upon admission and as  MONTREY BUIST ([id number]) , Visit Report for 04/16/2020 Arrival Information Details Patient Name: Date of Service: [name] 04/16/2020 12:30 PM Medical Record Number: [id number] Patient Account Number: [id number] Date of Birth/Sex: Treating RN: 01/07/1951 (70 y.o. Janyth Contes Primary Care Jachob Mcclean: Alan Wang Other Clinician: Referring Raushanah Osmundson: Treating Raylan Hanton/Extender: Alan Wang in Treatment: 7 Visit Information History Since Last Visit Added or deleted any medications: No Patient Arrived: Ambulatory Any new allergies or adverse reactions: No Arrival Time: 12:43 Had a fall or experienced change in No Accompanied By: wife activities of daily living that may affect Transfer Assistance: None risk of falls: Patient Identification Verified: Yes Signs or symptoms of abuse/neglect since last visito No Secondary Verification Process Completed: Yes Hospitalized since last visit: No Patient Requires Transmission-Based Precautions: No Implantable device outside of the clinic excluding No Patient Has Alerts: No cellular tissue based products placed in the center since last visit: Has Dressing in Place as Prescribed: Yes Pain Present Now: No Electronic Signature(s) Signed: 04/16/2020 5:02:12 PM By: Levan Hurst RN, BSN Entered By: Levan Hurst on 04/16/2020 12:43:38 -------------------------------------------------------------------------------- Clinic Level of Care Assessment Details Patient Name: Date of Service: [name] 04/16/2020 12:30 PM Medical Record Number: [id number] Patient Account Number: [id number] Date of Birth/Sex: Treating RN: [birth year] (70 y.o. [name] Primary Care Meda Dudzinski: Alan Wang Other Clinician: Referring Moe Graca: Treating Shaeleigh Graw/Extender: Alan Wang in Treatment: 7 Clinic Level of Care Assessment Items TOOL 4 Quantity Score [] - 0 Use when only an EandM  is performed on FOLLOW-UP visit ASSESSMENTS - Nursing Assessment / Reassessment X- 1 10 Reassessment of Co-morbidities (includes updates in patient status) X- 1 5 Reassessment of Adherence to Treatment Plan ASSESSMENTS - Wound and Skin A ssessment / Reassessment [] - 0 Simple Wound Assessment / Reassessment - one wound [] - 0 Complex Wound Assessment / Reassessment - multiple wounds X- 1 10 Dermatologic / Skin Assessment (not related to wound area) ASSESSMENTS - Focused Assessment [] - 0 Circumferential Edema Measurements - multi extremities [] - 0 Nutritional Assessment / Counseling / Intervention [] - 0 Lower Extremity Assessment (monofilament, tuning fork, pulses) [] - 0 Peripheral Arterial Disease Assessment (using hand held doppler) ASSESSMENTS - Ostomy and/or Continence Assessment and Care [] - 0 Incontinence Assessment and Management [] - 0 Ostomy Care Assessment and Management (repouching, etc.) PROCESS - Coordination of Care X - Simple Patient / Family Education for ongoing care 1 15 [] - 0 Complex (extensive) Patient / Family Education for ongoing care X- 1 10 Staff obtains Programmer, systems, Records, T Results / Process Orders est [] - 0 Staff telephones HHA, Nursing Homes / Clarify orders / etc [] - 0 Routine Transfer to another Facility (non-emergent condition) [] - 0 Routine Hospital Admission (non-emergent condition) [] - 0 New Admissions / Biomedical engineer / Ordering NPWT Apligraf, etc. , [] - 0 Emergency Hospital Admission (emergent condition) X- 1 10 Simple Discharge Coordination [] - 0 Complex (extensive) Discharge Coordination PROCESS - Special Needs [] - 0 Pediatric / Minor Patient Management [] - 0 Isolation Patient Management [] - 0 Hearing / Language / Visual special needs [] - 0 Assessment of Community assistance (transportation, D/C planning, etc.) [] - 0 Additional assistance / Altered mentation [] - 0 Support Surface(s)  Assessment (bed, cushion, seat, etc.) INTERVENTIONS - Wound Cleansing / Measurement X - Simple Wound Cleansing - one wound 1 5 [] - 0 Complex Wound

## 2020-04-16 NOTE — Progress Notes (Signed)
Alan Wang ([id number]) , Visit Report for 04/16/2020 Chief Complaint Document Details Patient Name: Date of Service: Alan Wang D. 04/16/2020 12:30 PM Medical Record Number: [id number] Patient Account Number: [id number] Date of Birth/Sex: Treating RN: 06/23/1950 (70 y.o. [name] Primary Care Provider: Aura Dials Other Clinician: Referring Provider: Treating Provider/Extender: Cassell Clement in Treatment: 7 Information Obtained from: Patient Chief Complaint Wound exam; patient is here for review of his skin condition in his coccyx area and surrounding buttock Electronic Signature(s) Signed: 04/16/2020 1:28:19 PM By: Worthy Keeler PA-C Entered By: Worthy Keeler on 04/16/2020 13:28:18 -------------------------------------------------------------------------------- HPI Details Patient Name: Date of Service: Alan Wang, Alan Wang D. 04/16/2020 12:30 PM Medical Record Number: [id number] Patient Account Number: [id number] Date of Birth/Sex: Treating RN: [birth year] (70 y.o. [name] Primary Care Provider: Aura Dials Other Clinician: Referring Provider: Treating Provider/Extender: Cassell Clement in Treatment: 7 History of Present Illness HPI Description: ADMISSION 02/21/2020 This is a 70 year old man with type 2 diabetes well controlled. For the last 3 months he has had a difficult painful/burning area in his coccyx extending between the gluteal cleft. He went to see his primary doctor who probably prescribed nystatin powder although they never managed to pick this up. They have been using something called [place] salve and paradoxically the areas gotten a lot better. There are small open areas but apparently one point these were quite a bit larger. Past medical history includes coronary artery disease, type 2 diabetes,o Rheumatoid arthritis, history of skin cancer in his left upper arm, asthma, CVA 12/23 he arrives  in clinic not receiving any supplies from [place]. This is apparently tangled up and fed ex. I also ordered ketoconazole for him for the remaining intertrigo however he does not seem to have gotten this either. Readmission: 04/16/2020 upon evaluation today patient appears for reevaluation here in the clinic although he was last seen on December 16 by Dr. Dellia Nims. At that time it was thought he may have a fungal type infection he did actually have ketoconazole cream prescribed to him. He did get this cream and has been using it according to the patient and his wife. Unfortunately though is not having as much drainage as he did he does still have a rash that is very irritating around the sacral and perianal region. There does not appear to be any signs of active infection at this time which is great news. Electronic Signature(s) Signed: 04/16/2020 2:03:41 PM By: Worthy Keeler PA-C Entered By: Worthy Keeler on 04/16/2020 14:03:41 -------------------------------------------------------------------------------- Physical Exam Details Patient Name: Date of Service: Alan Wang D. 04/16/2020 12:30 PM Medical Record Number: [id number] Patient Account Number: [id number] Date of Birth/Sex: Treating RN: 1950-03-13 (70 y.o. [name] Primary Care Provider: Aura Dials Other Clinician: Referring Provider: Treating Provider/Extender: Cassell Clement in Treatment: 7 Constitutional Well-nourished and well-hydrated in no acute distress. Respiratory normal breathing without difficulty. Psychiatric this patient is able to make decisions and demonstrates good insight into disease process. Alert and Oriented x 3. pleasant and cooperative. Notes Upon inspection patient does have an erythematous rash over the sacral region where the gluteal folds come together and then around the perianal region. This does seem to be more in my opinion of a fungal infection. I do feel like that  he would benefit from an antifungal powder as I feel like moisture is probably playing a role here as well. He  has had the ketoconazole cream but again I am not certain that that is can be as good for him to be honest. I think that just as a little bit more moisture to the region and again that is not really what is ideal here. Electronic Signature(s) Signed: 04/16/2020 2:04:18 PM By: Worthy Keeler PA-C Entered By: Worthy Keeler on 04/16/2020 14:04:18 -------------------------------------------------------------------------------- Physician Orders Details Patient Name: Date of Service: Alan Wang D. 04/16/2020 12:30 PM Medical Record Number: [id number] Patient Account Number: [id number] Date of Birth/Sex: Treating RN: [birth year] (70 y.o. [name] Primary Care Provider: Aura Dials Other Clinician: Referring Provider: Treating Provider/Extender: Cassell Clement in Treatment: 7 Verbal / Phone Orders: No Diagnosis Coding ICD-10 Coding Code Description L30.4 Erythema intertrigo L98.498 Non-pressure chronic ulcer of skin of other sites with other specified severity Follow-up Appointments Return Appointment in 2 weeks. Bathing/ Shower/ Hygiene May shower and wash wound with soap and water. Off-Loading Turn and reposition every 2 hours - while laying down. ensure while sitting to adjust/move to offload pressure to affected area. Wound Treatment Wound #1 - Coccyx Cleanser: Soap and Water 2 x Per Day/30 Days Discharge Instructions: May shower and wash wound with dial antibacterial soap and water prior to dressing change. Topical: Nystatin Powder 2 x Per Day/30 Days Discharge Instructions: spinkle lightly on affected gluteus and perianal area Secondary Dressing: Woven Gauze Sponge, Non-Sterile 4x4 in 2 x Per Day/30 Days Discharge Instructions: PLace folded piece of gauze between butt cheeks Patient Medications llergies: Avandia A Notifications  Medication Indication Start End 04/16/2020 nystatin DOSE topical 100,000 unit/gram powder - powder topical applied thinly covering the gluteal and perianal region affected by the rash 2 times per day after cleaning with soap and water Electronic Signature(s) Signed: 04/16/2020 1:30:00 PM By: Worthy Keeler PA-C Entered By: Worthy Keeler on 04/16/2020 13:30:00 -------------------------------------------------------------------------------- Problem List Details Patient Name: Date of Service: Alan Wang, Alan Wang D. 04/16/2020 12:30 PM Medical Record Number: [id number] Patient Account Number: [id number] Date of Birth/Sex: Treating RN: 1950/08/23 (70 y.o. [name] Primary Care Provider: Aura Dials Other Clinician: Referring Provider: Treating Provider/Extender: Cassell Clement in Treatment: 7 Active Problems ICD-10 Encounter Code Description Active Date MDM Diagnosis B35.6 Tinea cruris 02/21/2020 No Yes L98.498 Non-pressure chronic ulcer of skin of other sites with other specified severity 02/21/2020 No Yes Inactive Problems Resolved Problems Electronic Signature(s) Signed: 04/16/2020 1:28:12 PM By: Worthy Keeler PA-C Previous Signature: 04/16/2020 1:27:40 PM Version By: Worthy Keeler PA-C Entered By: Worthy Keeler on 04/16/2020 13:28:12 -------------------------------------------------------------------------------- Progress Note Details Patient Name: Date of Service: Alan Wang D. 04/16/2020 12:30 PM Medical Record Number: [id number] Patient Account Number: [id number] Date of Birth/Sex: Treating RN: Jun 16, 1950 (70 y.o. [name] Primary Care Provider: Aura Dials Other Clinician: Referring Provider: Treating Provider/Extender: Cassell Clement in Treatment: 7 Subjective Chief Complaint Information obtained from Patient Wound exam; patient is here for review of his skin condition in his coccyx area and  surrounding buttock History of Present Illness (HPI) ADMISSION 02/21/2020 This is a 70 year old man with type 2 diabetes well controlled. For the last 3 months he has had a difficult painful/burning area in his coccyx extending between the gluteal cleft. He went to see his primary doctor who probably prescribed nystatin powder although they never managed to pick this up. They have been using something called [place] salve and paradoxically the areas gotten a  lot better. There are small open areas but apparently one point these were quite a bit larger. Past medical history includes coronary artery disease, type 2 diabetes,o Rheumatoid arthritis, history of skin cancer in his left upper arm, asthma, CVA 12/23 he arrives in clinic not receiving any supplies from [place]. This is apparently tangled up and fed ex. I also ordered ketoconazole for him for the remaining intertrigo however he does not seem to have gotten this either. Readmission: 04/16/2020 upon evaluation today patient appears for reevaluation here in the clinic although he was last seen on December 16 by Dr. Dellia Nims. At that time it was thought he may have a fungal type infection he did actually have ketoconazole cream prescribed to him. He did get this cream and has been using it according to the patient and his wife. Unfortunately though is not having as much drainage as he did he does still have a rash that is very irritating around the sacral and perianal region. There does not appear to be any signs of active infection at this time which is great news. Objective Constitutional Well-nourished and well-hydrated in no acute distress. Vitals Time Taken: 12:45 PM, Height: 70 in, Weight: 204 lbs, BMI: 29.3, Temperature: 99.1 F, Pulse: 84 bpm, Respiratory Rate: 16 breaths/min, Blood Pressure: 132/79 mmHg, Capillary Blood Glucose: 165 mg/dl. General Notes: glucose per pt report Respiratory normal breathing without  difficulty. Psychiatric this patient is able to make decisions and demonstrates good insight into disease process. Alert and Oriented x 3. pleasant and cooperative. General Notes: Upon inspection patient does have an erythematous rash over the sacral region where the gluteal folds come together and then around the perianal region. This does seem to be more in my opinion of a fungal infection. I do feel like that he would benefit from an antifungal powder as I feel like moisture is probably playing a role here as well. He has had the ketoconazole cream but again I am not certain that that is can be as good for him to be honest. I think that just as a little bit more moisture to the region and again that is not really what is ideal here. Integumentary (Hair, Skin) Wound #1 status is Open. Original cause of wound was Gradually Appeared. The wound is located on the Coccyx. The wound measures 4.5cm length x 2.5cm width x 0.1cm depth; 8.836cm^2 area and 0.884cm^3 volume. There is no tunneling or undermining noted. There is a medium amount of serous drainage noted. The wound margin is flat and intact. There is large (67-100%) pale granulation within the wound bed. There is no necrotic tissue within the wound bed. Assessment Active Problems ICD-10 Tinea cruris Non-pressure chronic ulcer of skin of other sites with other specified severity Plan Follow-up Appointments: Return Appointment in 2 weeks. Bathing/ Shower/ Hygiene: May shower and wash wound with soap and water. Off-Loading: Turn and reposition every 2 hours - while laying down. ensure while sitting to adjust/move to offload pressure to affected area. The following medication(s) was prescribed: nystatin topical 100,000 unit/gram powder powder topical applied thinly covering the gluteal and perianal region affected by the rash 2 times per day after cleaning with soap and water starting 04/16/2020 WOUND #1: - Coccyx Wound Laterality: Cleanser:  Soap and Water 2 x Per Day/30 Days Discharge Instructions: May shower and wash wound with dial antibacterial soap and water prior to dressing change. Topical: Nystatin Powder 2 x Per Day/30 Days Discharge Instructions: spinkle lightly on affected gluteus and perianal  area Secondary Dressing: Woven Gauze Sponge, Non-Sterile 4x4 in 2 x Per Day/30 Days Discharge Instructions: PLace folded piece of gauze between butt cheeks 1. Would recommend currently that we go ahead and prescribe nystatin powder for the patient he is in agreement with that plan I did send that into the pharmacy today. We will see how things do over the next couple of weeks. 2. I am also can recommend that he use just a folded up gauze between the gluteal region to help catch any excess moisture. Obviously I think that this can be of benefit as well between the 2 I think we keep the area much drier. 3. I am also can recommend that the patient continue to offload as far as make sure is not sitting for too long to give himself a break to get up and stand at least for 5 minutes or so every hour. I think this will also benefit him. We will see patient back for reevaluation in 2 weeks here in the clinic. If anything worsens or changes patient will contact our office for additional recommendations. Patient's wife was present during the evaluation today. Electronic Signature(s) Signed: 04/16/2020 2:05:09 PM By: Worthy Keeler PA-C Entered By: Worthy Keeler on 04/16/2020 14:05:08 -------------------------------------------------------------------------------- SuperBill Details Patient Name: Date of Service: Alan Wang, Alan Wang D. 04/16/2020 Medical Record Number: [id number] Patient Account Number: [id number] Date of Birth/Sex: Treating RN: 1950-10-15 (71 y.o. [name] Primary Care Provider: Aura Dials Other Clinician: Referring Provider: Treating Provider/Extender: Cassell Clement in Treatment:  7 Diagnosis Coding ICD-10 Codes Code Description L30.4 Erythema intertrigo L98.498 Non-pressure chronic ulcer of skin of other sites with other specified severity Facility Procedures CPT4 Code: [id number] Description: 99213 - WOUND CARE VISIT-LEV 3 EST PT Modifier: Quantity: 1 Physician Procedures : CPT4 Code Description Modifier [id number] - WC PHYS LEVEL 4 - EST PT ICD-10 Diagnosis Description L30.4 Erythema intertrigo L98.498 Non-pressure chronic ulcer of skin of other sites with other specified severity Quantity: 1 Electronic Signature(s) Signed: 04/16/2020 2:05:23 PM By: Worthy Keeler PA-C Entered By: Worthy Keeler on 04/16/2020 14:05:23

## 2020-04-23 ENCOUNTER — Encounter: Payer: Self-pay | Admitting: Rheumatology

## 2020-04-23 ENCOUNTER — Ambulatory Visit (INDEPENDENT_AMBULATORY_CARE_PROVIDER_SITE_OTHER): Payer: Medicare HMO | Admitting: Rheumatology

## 2020-04-23 ENCOUNTER — Other Ambulatory Visit: Payer: Self-pay

## 2020-04-23 VITALS — BP 112/74 | HR 72 | Resp 12 | Ht 70.0 in | Wt 203.0 lb

## 2020-04-23 DIAGNOSIS — M170 Bilateral primary osteoarthritis of knee: Secondary | ICD-10-CM | POA: Diagnosis not present

## 2020-04-23 DIAGNOSIS — M1990 Unspecified osteoarthritis, unspecified site: Secondary | ICD-10-CM | POA: Diagnosis not present

## 2020-04-23 DIAGNOSIS — Z8639 Personal history of other endocrine, nutritional and metabolic disease: Secondary | ICD-10-CM | POA: Diagnosis not present

## 2020-04-23 DIAGNOSIS — G8929 Other chronic pain: Secondary | ICD-10-CM

## 2020-04-23 DIAGNOSIS — U071 COVID-19: Secondary | ICD-10-CM

## 2020-04-23 DIAGNOSIS — Z9889 Other specified postprocedural states: Secondary | ICD-10-CM

## 2020-04-23 DIAGNOSIS — M19041 Primary osteoarthritis, right hand: Secondary | ICD-10-CM | POA: Diagnosis not present

## 2020-04-23 DIAGNOSIS — M19042 Primary osteoarthritis, left hand: Secondary | ICD-10-CM

## 2020-04-23 DIAGNOSIS — Z8673 Personal history of transient ischemic attack (TIA), and cerebral infarction without residual deficits: Secondary | ICD-10-CM | POA: Diagnosis not present

## 2020-04-23 DIAGNOSIS — D472 Monoclonal gammopathy: Secondary | ICD-10-CM | POA: Diagnosis not present

## 2020-04-23 DIAGNOSIS — Z8679 Personal history of other diseases of the circulatory system: Secondary | ICD-10-CM | POA: Diagnosis not present

## 2020-04-23 DIAGNOSIS — Z5181 Encounter for therapeutic drug level monitoring: Secondary | ICD-10-CM

## 2020-04-23 DIAGNOSIS — Z79899 Other long term (current) drug therapy: Secondary | ICD-10-CM

## 2020-04-23 NOTE — Patient Instructions (Addendum)
Standing Labs We placed an order today for your standing lab work.   Please have your standing labs drawn in April and every 3 months  If possible, please have your labs drawn 2 weeks prior to your appointment so that the provider can discuss your results at your appointment.  We have open lab daily Monday through Thursday from 1:30-4:30 PM and Friday from 1:30-4:00 PM at the office of Dr. Bo Merino, [facility].   Please be advised, all patients with office appointments requiring lab work will take precedents over walk-in lab work.  If possible, please come for your lab work on Monday and Friday afternoons, as you may experience shorter wait times. The office is located at [address] [place], [place], [place] [address] No appointment is necessary.   Labs are drawn by [facility]. Please bring your co-pay at the time of your lab draw.  You may receive a bill from [facility] for your lab work.  If you wish to have your labs drawn at another location, please call the office 24 hours in advance to send orders.  If you have any questions regarding directions or hours of operation,  please call [phone].   As a reminder, please drink plenty of water prior to coming for your lab work. Thanks!  Journal for Nurse Practitioners, 15(4), [phone]. Retrieved December 12, 2017 from [email or web address].[email or web address]">  Knee Exercises Ask your health care provider which exercises are safe for you. Do exercises exactly as told by your health care provider and adjust them as directed. It is normal to feel mild stretching, pulling, tightness, or discomfort as you do these exercises. Stop right away if you feel sudden pain or your pain gets worse. Do not begin these exercises until told by your health care provider. Stretching and range-of-motion exercises These exercises warm up your muscles and joints and improve the movement and flexibility of your knee. These exercises also help to relieve  pain and swelling. Knee extension, prone 1. Lie on your abdomen (prone position) on a bed. 2. Place your left / right knee just beyond the edge of the surface so your knee is not on the bed. You can put a towel under your left / right thigh just above your kneecap for comfort. 3. Relax your leg muscles and allow gravity to straighten your knee (extension). You should feel a stretch behind your left / right knee. 4. Hold this position for __________ seconds. 5. Scoot up so your knee is supported between repetitions. Repeat __________ times. Complete this exercise __________ times a day. Knee flexion, active 1. Lie on your back with both legs straight. If this causes back discomfort, bend your left / right knee so your foot is flat on the floor. 2. Slowly slide your left / right heel back toward your buttocks. Stop when you feel a gentle stretch in the front of your knee or thigh (flexion). 3. Hold this position for __________ seconds. 4. Slowly slide your left / right heel back to the starting position. Repeat __________ times. Complete this exercise __________ times a day.   Quadriceps stretch, prone 1. Lie on your abdomen on a firm surface, such as a bed or padded floor. 2. Bend your left / right knee and hold your ankle. If you cannot reach your ankle or pant leg, loop a belt around your foot and grab the belt instead. 3. Gently pull your heel toward your buttocks. Your knee should not slide out to the side. You should  feel a stretch in the front of your thigh and knee (quadriceps). 4. Hold this position for __________ seconds. Repeat __________ times. Complete this exercise __________ times a day.   Hamstring, supine 1. Lie on your back (supine position). 2. Loop a belt or towel over the ball of your left / right foot. The ball of your foot is on the walking surface, right under your toes. 3. Straighten your left / right knee and slowly pull on the belt to raise your leg until you feel a  gentle stretch behind your knee (hamstring). ? Do not let your knee bend while you do this. ? Keep your other leg flat on the floor. 4. Hold this position for __________ seconds. Repeat __________ times. Complete this exercise __________ times a day. Strengthening exercises These exercises build strength and endurance in your knee. Endurance is the ability to use your muscles for a long time, even after they get tired. Quadriceps, isometric This exercise stretches the muscles in front of your thigh (quadriceps) without moving your knee joint (isometric). 1. Lie on your back with your left / right leg extended and your other knee bent. Put a rolled towel or small pillow under your knee if told by your health care provider. 2. Slowly tense the muscles in the front of your left / right thigh. You should see your kneecap slide up toward your hip or see increased dimpling just above the knee. This motion will push the back of the knee toward the floor. 3. For __________ seconds, hold the muscle as tight as you can without increasing your pain. 4. Relax the muscles slowly and completely. Repeat __________ times. Complete this exercise __________ times a day.   Straight leg raises This exercise stretches the muscles in front of your thigh (quadriceps) and the muscles that move your hips (hip flexors). 1. Lie on your back with your left / right leg extended and your other knee bent. 2. Tense the muscles in the front of your left / right thigh. You should see your kneecap slide up or see increased dimpling just above the knee. Your thigh may even shake a bit. 3. Keep these muscles tight as you raise your leg 4-6 inches (10-15 cm) off the floor. Do not let your knee bend. 4. Hold this position for __________ seconds. 5. Keep these muscles tense as you lower your leg. 6. Relax your muscles slowly and completely after each repetition. Repeat __________ times. Complete this exercise __________ times a  day. Hamstring, isometric 1. Lie on your back on a firm surface. 2. Bend your left / right knee about __________ degrees. 3. Dig your left / right heel into the surface as if you are trying to pull it toward your buttocks. Tighten the muscles in the back of your thighs (hamstring) to "dig" as hard as you can without increasing any pain. 4. Hold this position for __________ seconds. 5. Release the tension gradually and allow your muscles to relax completely for __________ seconds after each repetition. Repeat __________ times. Complete this exercise __________ times a day. Hamstring curls If told by your health care provider, do this exercise while wearing ankle weights. Begin with __________ lb weights. Then increase the weight by 1 lb (0.5 kg) increments. Do not wear ankle weights that are more than __________ lb. 1. Lie on your abdomen with your legs straight. 2. Bend your left / right knee as far as you can without feeling pain. Keep your hips flat against the  floor. 3. Hold this position for __________ seconds. 4. Slowly lower your leg to the starting position. Repeat __________ times. Complete this exercise __________ times a day.   Squats This exercise strengthens the muscles in front of your thigh and knee (quadriceps). 1. Stand in front of a table, with your feet and knees pointing straight ahead. You may rest your hands on the table for balance but not for support. 2. Slowly bend your knees and lower your hips like you are going to sit in a chair. ? Keep your weight over your heels, not over your toes. ? Keep your lower legs upright so they are parallel with the table legs. ? Do not let your hips go lower than your knees. ? Do not bend lower than told by your health care provider. ? If your knee pain increases, do not bend as low. 3. Hold the squat position for __________ seconds. 4. Slowly push with your legs to return to standing. Do not use your hands to pull yourself to  standing. Repeat __________ times. Complete this exercise __________ times a day. Wall slides This exercise strengthens the muscles in front of your thigh and knee (quadriceps). 1. Lean your back against a smooth wall or door, and walk your feet out 18-24 inches (46-61 cm) from it. 2. Place your feet hip-width apart. 3. Slowly slide down the wall or door until your knees bend __________ degrees. Keep your knees over your heels, not over your toes. Keep your knees in line with your hips. 4. Hold this position for __________ seconds. Repeat __________ times. Complete this exercise __________ times a day.   Straight leg raises This exercise strengthens the muscles that rotate the leg at the hip and move it away from your body (hip abductors). 1. Lie on your side with your left / right leg in the top position. Lie so your head, shoulder, knee, and hip line up. You may bend your bottom knee to help you keep your balance. 2. Roll your hips slightly forward so your hips are stacked directly over each other and your left / right knee is facing forward. 3. Leading with your heel, lift your top leg 4-6 inches (10-15 cm). You should feel the muscles in your outer hip lifting. ? Do not let your foot drift forward. ? Do not let your knee roll toward the ceiling. 4. Hold this position for __________ seconds. 5. Slowly return your leg to the starting position. 6. Let your muscles relax completely after each repetition. Repeat __________ times. Complete this exercise __________ times a day.   Straight leg raises This exercise stretches the muscles that move your hips away from the front of the pelvis (hip extensors). 1. Lie on your abdomen on a firm surface. You can put a pillow under your hips if that is more comfortable. 2. Tense the muscles in your buttocks and lift your left / right leg about 4-6 inches (10-15 cm). Keep your knee straight as you lift your leg. 3. Hold this position for __________  seconds. 4. Slowly lower your leg to the starting position. 5. Let your leg relax completely after each repetition. Repeat __________ times. Complete this exercise __________ times a day. This information is not intended to replace advice given to you by your health care provider. Make sure you discuss any questions you have with your health care provider. Document Revised: 12/13/2017 Document Reviewed: 12/13/2017 [employer]vier Patient Education  2021 [employer].

## 2020-04-25 ENCOUNTER — Other Ambulatory Visit: Payer: Self-pay

## 2020-04-25 DIAGNOSIS — G459 Transient cerebral ischemic attack, unspecified: Secondary | ICD-10-CM

## 2020-04-25 MED ORDER — CARVEDILOL 12.5 MG PO TABS: 12.5000 mg | ORAL_TABLET | Freq: Two times a day (BID) | ORAL | 1 refills | Status: DC

## 2020-04-25 MED ORDER — OLMESARTAN MEDOXOMIL 20 MG PO TABS: 20.0000 mg | ORAL_TABLET | Freq: Every day | ORAL | 1 refills | Status: DC

## 2020-05-05 DIAGNOSIS — E1165 Type 2 diabetes mellitus with hyperglycemia: Secondary | ICD-10-CM | POA: Diagnosis not present

## 2020-05-05 DIAGNOSIS — E7800 Pure hypercholesterolemia, unspecified: Secondary | ICD-10-CM | POA: Diagnosis not present

## 2020-05-07 ENCOUNTER — Encounter (HOSPITAL_BASED_OUTPATIENT_CLINIC_OR_DEPARTMENT_OTHER): Payer: Medicare HMO | Admitting: Physician Assistant

## 2020-05-08 DIAGNOSIS — E1165 Type 2 diabetes mellitus with hyperglycemia: Secondary | ICD-10-CM | POA: Diagnosis not present

## 2020-05-08 DIAGNOSIS — I10 Essential (primary) hypertension: Secondary | ICD-10-CM | POA: Diagnosis not present

## 2020-05-08 DIAGNOSIS — E7800 Pure hypercholesterolemia, unspecified: Secondary | ICD-10-CM | POA: Diagnosis not present

## 2020-05-08 DIAGNOSIS — L0291 Cutaneous abscess, unspecified: Secondary | ICD-10-CM | POA: Diagnosis not present

## 2020-05-08 DIAGNOSIS — G609 Hereditary and idiopathic neuropathy, unspecified: Secondary | ICD-10-CM | POA: Diagnosis not present

## 2020-05-14 ENCOUNTER — Other Ambulatory Visit: Payer: Self-pay

## 2020-05-14 ENCOUNTER — Encounter (HOSPITAL_BASED_OUTPATIENT_CLINIC_OR_DEPARTMENT_OTHER): Payer: Medicare HMO | Attending: Physician Assistant | Admitting: Physician Assistant

## 2020-05-14 DIAGNOSIS — M069 Rheumatoid arthritis, unspecified: Secondary | ICD-10-CM | POA: Diagnosis not present

## 2020-05-14 DIAGNOSIS — E11622 Type 2 diabetes mellitus with other skin ulcer: Secondary | ICD-10-CM | POA: Diagnosis not present

## 2020-05-14 DIAGNOSIS — L98498 Non-pressure chronic ulcer of skin of other sites with other specified severity: Secondary | ICD-10-CM | POA: Insufficient documentation

## 2020-05-14 DIAGNOSIS — E1151 Type 2 diabetes mellitus with diabetic peripheral angiopathy without gangrene: Secondary | ICD-10-CM | POA: Diagnosis not present

## 2020-05-14 NOTE — Progress Notes (Addendum)
MOSES ODOHERTY ([id number]) , Visit Report for 05/14/2020 Chief Complaint Document Details Patient Name: Date of Service: [name] 05/14/2020 12:30 PM Medical Record Number: [id number] Patient Account Number: [id number] Date of Birth/Sex: Treating RN: 1950/05/07 (70 y.o. [name] Primary Care Provider: Aura Dials Other Clinician: Referring Provider: Treating Provider/Extender: Cassell Clement in Treatment: 11 Information Obtained from: Patient Chief Complaint Wound exam; patient is here for review of his skin condition in his coccyx area and surrounding buttock Electronic Signature(s) Signed: 05/14/2020 1:00:16 PM By: Worthy Keeler PA-C Entered By: Worthy Keeler on 05/14/2020 13:00:16 -------------------------------------------------------------------------------- HPI Details Patient Name: Date of Service: [name], [name] 05/14/2020 12:30 PM Medical Record Number: [id number] Patient Account Number: [id number] Date of Birth/Sex: Treating RN: 1951/02/08 (70 y.o. [name] Primary Care Provider: Aura Dials Other Clinician: Referring Provider: Treating Provider/Extender: Cassell Clement in Treatment: 11 History of Present Illness HPI Description: ADMISSION 02/21/2020 This is a 70 year old man with type 2 diabetes well controlled. For the last 3 months he has had a difficult painful/burning area in his coccyx extending between the gluteal cleft. He went to see his primary doctor who probably prescribed nystatin powder although they never managed to pick this up. They have been using something called [place] salve and paradoxically the areas gotten a lot better. There are small open areas but apparently one point these were quite a bit larger. Past medical history includes coronary artery disease, type 2 diabetes,o Rheumatoid arthritis, history of skin cancer in his left upper arm, asthma, CVA 12/23 he  arrives in clinic not receiving any supplies from [place]. This is apparently tangled up and [employer] ex. I also ordered ketoconazole for him for the remaining intertrigo however he does not seem to have gotten this either. Readmission: 04/16/2020 upon evaluation today patient appears for reevaluation here in the clinic although he was last seen on December 16 by Dr. Dellia Nims. At that time it was thought he may have a fungal type infection he did actually have ketoconazole cream prescribed to him. He did get this cream and has been using it according to the patient and his wife. Unfortunately though is not having as much drainage as he did he does still have a rash that is very irritating around the sacral and perianal region. There does not appear to be any signs of active infection at this time which is great news. 05/14/2020 upon evaluation today patient appears to be doing well with regard to his wound currently. He has been tolerating the dressing changes without complication. Fortunately I think that he is better than last time I saw him. I feel that the nystatin powder is helping. With that being said I do believe that he is going require some slight changes in his treatment regimen to try to get things under better control here. Electronic Signature(s) Signed: 05/14/2020 1:24:20 PM By: Worthy Keeler PA-C Entered By: Worthy Keeler on 05/14/2020 13:24:17 -------------------------------------------------------------------------------- Physical Exam Details Patient Name: Date of Service: [name] 05/14/2020 12:30 PM Medical Record Number: [id number] Patient Account Number: [id number] Date of Birth/Sex: Treating RN: Jan 19, 1951 (70 y.o. [name] Primary Care Provider: Aura Dials Other Clinician: Referring Provider: Treating Provider/Extender: Cassell Clement in Treatment: 85 Constitutional Well-nourished and well-hydrated in no acute  distress. Respiratory normal breathing without difficulty. Psychiatric this patient is able to make decisions and demonstrates good insight into  disease process. Alert and Oriented x 3. pleasant and cooperative. Notes Upon inspection patient's wound bed actually does not appear to be doing too poorly which is great news. I am very pleased with where things stand. With that being said he does have a small split right in the mid sacral region. I think that this is due to moisture breakdown evidenced by some of the moisture noted. I think we may want to not use a dressing over this and just put the nystatin powder and then allow this to be more open to air so that it hopefully will dry out a little better. The patient is in agreement with giving that a shot and see how things do. Electronic Signature(s) Signed: 05/14/2020 1:26:28 PM By: Worthy Keeler PA-C Entered By: Worthy Keeler on 05/14/2020 13:26:18 -------------------------------------------------------------------------------- Physician Orders Details Patient Name: Date of Service: [name], [name] 05/14/2020 12:30 PM Medical Record Number: [id number] Patient Account Number: [id number] Date of Birth/Sex: Treating RN: 07/07/1950 (70 y.o. [name] Primary Care Provider: Aura Dials Other Clinician: Referring Provider: Treating Provider/Extender: Cassell Clement in Treatment: [phone] Verbal / Phone Orders: No Diagnosis Coding ICD-10 Coding Code Description B35.6 Tinea cruris L98.498 Non-pressure chronic ulcer of skin of other sites with other specified severity Follow-up Appointments Return Appointment in 2 weeks. Bathing/ Shower/ Hygiene May shower and wash wound with soap and water. Off-Loading Turn and reposition every 2 hours - while laying down. ensure while sitting to adjust/move to offload pressure to affected area. Wound Treatment Wound #1 - Coccyx Cleanser: Soap and Water 2 x Per Day/30  Days Discharge Instructions: May shower and wash wound with dial antibacterial soap and water prior to dressing change. Topical: Nystatin Powder 2 x Per Day/30 Days Discharge Instructions: sprinkle lightly on affected gluteus and perianal area, leave open to air Electronic Signature(s) Signed: 05/14/2020 5:38:33 PM By: Lorrin Jackson Signed: 05/14/2020 6:04:37 PM By: Worthy Keeler PA-C Entered By: Lorrin Jackson on 05/14/2020 13:06:48 -------------------------------------------------------------------------------- Problem List Details Patient Name: Date of Service: [name], [name] 05/14/2020 12:30 PM Medical Record Number: [id number] Patient Account Number: [id number] Date of Birth/Sex: Treating RN: March 27, 1950 (70 y.o. [name] Primary Care Provider: Aura Dials Other Clinician: Referring Provider: Treating Provider/Extender: Cassell Clement in Treatment: 11 Active Problems ICD-10 Encounter Code Description Active Date MDM Diagnosis B35.6 Tinea cruris 02/21/2020 No Yes L98.498 Non-pressure chronic ulcer of skin of other sites with other specified severity 02/21/2020 No Yes Inactive Problems Resolved Problems Electronic Signature(s) Signed: 05/14/2020 1:00:07 PM By: Worthy Keeler PA-C Entered By: Worthy Keeler on 05/14/2020 13:00:07 -------------------------------------------------------------------------------- Progress Note Details Patient Name: Date of Service: [name] 05/14/2020 12:30 PM Medical Record Number: [id number] Patient Account Number: [id number] Date of Birth/Sex: Treating RN: 02-Jul-1950 (70 y.o. [name] Primary Care Provider: Aura Dials Other Clinician: Referring Provider: Treating Provider/Extender: Cassell Clement in Treatment: 11 Subjective Chief Complaint Information obtained from Patient Wound exam; patient is here for review of his skin condition in his coccyx area  and surrounding buttock History of Present Illness (HPI) ADMISSION 02/21/2020 This is a 70 year old man with type 2 diabetes well controlled. For the last 3 months he has had a difficult painful/burning area in his coccyx extending between the gluteal cleft. He went to see his primary doctor who probably prescribed nystatin powder although they never managed to pick this up. They have been using  something called [place] salve and paradoxically the areas gotten a lot better. There are small open areas but apparently one point these were quite a bit larger. Past medical history includes coronary artery disease, type 2 diabetes,o Rheumatoid arthritis, history of skin cancer in his left upper arm, asthma, CVA 12/23 he arrives in clinic not receiving any supplies from [place]. This is apparently tangled up and fed ex. I also ordered ketoconazole for him for the remaining intertrigo however he does not seem to have gotten this either. Readmission: 04/16/2020 upon evaluation today patient appears for reevaluation here in the clinic although he was last seen on December 16 by Dr. Dellia Nims. At that time it was thought he may have a fungal type infection he did actually have ketoconazole cream prescribed to him. He did get this cream and has been using it according to the patient and his wife. Unfortunately though is not having as much drainage as he did he does still have a rash that is very irritating around the sacral and perianal region. There does not appear to be any signs of active infection at this time which is great news. 05/14/2020 upon evaluation today patient appears to be doing well with regard to his wound currently. He has been tolerating the dressing changes without complication. Fortunately I think that he is better than last time I saw him. I feel that the nystatin powder is helping. With that being said I do believe that he is going require some slight changes in his treatment regimen to try to  get things under better control here. Objective Constitutional Well-nourished and well-hydrated in no acute distress. Vitals Time Taken: 12:36 PM, Height: 70 in, Weight: 204 lbs, BMI: 29.3, Temperature: 98.3 F, Pulse: 73 bpm, Respiratory Rate: 16 breaths/min, Blood Pressure: 126/80 mmHg, Capillary Blood Glucose: 97 mg/dl. Respiratory normal breathing without difficulty. Psychiatric this patient is able to make decisions and demonstrates good insight into disease process. Alert and Oriented x 3. pleasant and cooperative. General Notes: Upon inspection patient's wound bed actually does not appear to be doing too poorly which is great news. I am very pleased with where things stand. With that being said he does have a small split right in the mid sacral region. I think that this is due to moisture breakdown evidenced by some of the moisture noted. I think we may want to not use a dressing over this and just put the nystatin powder and then allow this to be more open to air so that it hopefully will dry out a little better. The patient is in agreement with giving that a shot and see how things do. Integumentary (Hair, Skin) Wound #1 status is Open. Original cause of wound was Gradually Appeared. The date acquired was: 12/10/2019. The wound has been in treatment 11 weeks. The wound is located on the Coccyx. The wound measures 0.5cm length x 0.3cm width x 0.1cm depth; 0.118cm^2 area and 0.012cm^3 volume. There is no tunneling or undermining noted. There is a medium amount of serous drainage noted. The wound margin is flat and intact. There is large (67-100%) pale granulation within the wound bed. There is no necrotic tissue within the wound bed. Assessment Active Problems ICD-10 Tinea cruris Non-pressure chronic ulcer of skin of other sites with other specified severity Plan Follow-up Appointments: Return Appointment in 2 weeks. Bathing/ Shower/ Hygiene: May shower and wash wound with soap and  water. Off-Loading: Turn and reposition every 2 hours - while laying down. ensure while sitting  to adjust/move to offload pressure to affected area. WOUND #1: - Coccyx Wound Laterality: Cleanser: Soap and Water 2 x Per Day/30 Days Discharge Instructions: May shower and wash wound with dial antibacterial soap and water prior to dressing change. Topical: Nystatin Powder 2 x Per Day/30 Days Discharge Instructions: sprinkle lightly on affected gluteus and perianal area, leave open to air 1. Would recommend that we continue at this time to use nystatin powder for the patient. I think this is doing a good job of keeping things a bit more dry. 2. I am also can recommend that we have the patient go ahead and continue with offloading. He is doing a good job of this from what he is telling me I think the biggest issue here is simply that we need to try to keep as much pressure as possible off in order to allow this to heal appropriately. 3. I am also can recommend we discontinue any cover dressing he will just apply the nystatin powder twice a day. We will see patient back for reevaluation in 2 weeks here in the clinic. If anything worsens or changes patient will contact our office for additional recommendations. Electronic Signature(s) Signed: 05/14/2020 1:30:34 PM By: Worthy Keeler PA-C Entered By: Worthy Keeler on 05/14/2020 13:30:34 -------------------------------------------------------------------------------- SuperBill Details Patient Name: Date of Service: [name], [name] 05/14/2020 Medical Record Number: [id number] Patient Account Number: [id number] Date of Birth/Sex: Treating RN: [birth year] (70 y.o. [name] Primary Care Provider: Aura Dials Other Clinician: Referring Provider: Treating Provider/Extender: Cassell Clement in Treatment: 11 Diagnosis Coding ICD-10 Codes Code Description B35.6 Tinea cruris L98.498 Non-pressure chronic ulcer of skin of  other sites with other specified severity Facility Procedures CPT4 Code: [id number] Description: 99213 - WOUND CARE VISIT-LEV 3 EST PT Modifier: Quantity: 1 Physician Procedures : CPT4 Code Description Modifier [id number] - WC PHYS LEVEL 3 - EST PT ICD-10 Diagnosis Description B35.6 Tinea cruris L98.498 Non-pressure chronic ulcer of skin of other sites with other specified severity Quantity: 1 Electronic Signature(s) Signed: 05/14/2020 1:31:03 PM By: Worthy Keeler PA-C Entered By: Worthy Keeler on 05/14/2020 13:31:03

## 2020-05-16 NOTE — Progress Notes (Signed)
Alan Wang ([id number]) , Visit Report for 05/14/2020 Arrival Information Details Patient Name: Date of Service: Alan Morton D. 05/14/2020 12:30 PM Medical Record Number: [id number] Patient Account Number: [id number] Date of Birth/Sex: Treating RN: 15-Feb-1951 (69 y.o. Alan Wang Primary Care Dareld Mcauliffe: Aura Dials Other Clinician: Referring Kambri Dismore: Treating Candid Bovey/Extender: Cassell Clement in Treatment: 11 Visit Information History Since Last Visit Added or deleted any medications: No Patient Arrived: Ambulatory Any new allergies or adverse reactions: No Arrival Time: 12:35 Had a fall or experienced change in No Accompanied By: wife activities of daily living that may affect Transfer Assistance: None risk of falls: Patient Identification Verified: Yes Signs or symptoms of abuse/neglect since last visito No Secondary Verification Process Completed: Yes Hospitalized since last visit: No Patient Requires Transmission-Based Precautions: No Implantable device outside of the clinic excluding No Patient Has Alerts: No cellular tissue based products placed in the center since last visit: Has Dressing in Place as Prescribed: Yes Pain Present Now: No Electronic Signature(s) Signed: 05/14/2020 4:22:28 PM By: Sandre Kitty Entered By: Sandre Kitty on 05/14/2020 12:36:37 -------------------------------------------------------------------------------- Clinic Level of Care Assessment Details Patient Name: Date of Service: Alan Morton D. 05/14/2020 12:30 PM Medical Record Number: [id number] Patient Account Number: [id number] Date of Birth/Sex: Treating RN: 05-17-1950 (70 y.o. Alan Wang Primary Care Daiya Tamer: Aura Dials Other Clinician: Referring Pilar Corrales: Treating Rithwik Schmieg/Extender: Cassell Clement in Treatment: 11 Clinic Level of Care Assessment Items TOOL 4 Quantity Score X- 1 0 Use when only an EandM  is performed on FOLLOW-UP visit ASSESSMENTS - Nursing Assessment / Reassessment X- 1 10 Reassessment of Co-morbidities (includes updates in patient status) X- 1 5 Reassessment of Adherence to Treatment Plan ASSESSMENTS - Wound and Skin A ssessment / Reassessment X - Simple Wound Assessment / Reassessment - one wound 1 5 _0  - 0 Complex Wound Assessment / Reassessment - multiple wounds _1  - 0 Dermatologic / Skin Assessment (not related to wound area) ASSESSMENTS - Focused Assessment _2  - 0 Circumferential Edema Measurements - multi extremities _3  - 0 Nutritional Assessment / Counseling / Intervention _4  - 0 Lower Extremity Assessment (monofilament, tuning fork, pulses) _5  - 0 Peripheral Arterial Disease Assessment (using hand held doppler) ASSESSMENTS - Ostomy and/or Continence Assessment and Care _6  - 0 Incontinence Assessment and Management _7  - 0 Ostomy Care Assessment and Management (repouching, etc.) PROCESS - Coordination of Care _8  - 0 Simple Patient / Family Education for ongoing care X- 1 20 Complex (extensive) Patient / Family Education for ongoing care X- 1 10 Staff obtains Programmer, systems, Records, T Results / Process Orders est _9  - 0 Staff telephones HHA, Nursing Homes / Clarify orders / etc _10  - 0 Routine Transfer to another Facility (non-emergent condition) _11  - 0 Routine Hospital Admission (non-emergent condition) _12  - 0 New Admissions / Biomedical engineer / Ordering NPWT Apligraf, etc. , _13  - 0 Emergency Hospital Admission (emergent condition) X- 1 10 Simple Discharge Coordination _14  - 0 Complex (extensive) Discharge Coordination PROCESS - Special Needs _15  - 0 Pediatric / Minor Patient Management _16  - 0 Isolation Patient Management _17  - 0 Hearing / Language / Visual special needs _18  - 0 Assessment of Community assistance (transportation, D/C planning, etc.) _19  - 0 Additional assistance / Altered mentation _20  - 0 Support Surface(s) Assessment  (bed, cushion, seat, etc.) INTERVENTIONS - Wound Cleansing / Measurement X - Simple Wound Cleansing - one wound 1 5 _21  - 0 Complex Wound Cleansing -  multiple wounds X- 1 5 Wound Imaging (photographs - any number of wounds) _0  - 0 Wound Tracing (instead of photographs) X- 1 5 Simple Wound Measurement - one wound _1  - 0 Complex Wound Measurement - multiple wounds INTERVENTIONS - Wound Dressings X - Small Wound Dressing one or multiple wounds 1 10 _2  - 0 Medium Wound Dressing one or multiple wounds _3  - 0 Large Wound Dressing one or multiple wounds X- 1 5 Application of Medications - topical <OZHYQMVHQIONGEXB>_2<\/[id number]>_6  - 0 Application of Medications - injection INTERVENTIONS - Miscellaneous _5  - 0 External ear exam _6  - 0 Specimen Collection (cultures, biopsies, blood, body fluids, etc.) _7  - 0 Specimen(s) / Culture(s) sent or taken to Lab for analysis _8  - 0 Patient Transfer (multiple staff / Civil Service fast streamer / Similar devices) _9  - 0 Simple Staple / Suture removal (25 or less) _10  - 0 Complex Staple / Suture removal (26 or more) _11  - 0 Hypo / Hyperglycemic Management (close monitor of Blood Glucose) _12  - 0 Ankle / Brachial Index (ABI) - do not check if billed separately X- 1 5 Vital Signs Has the patient been seen at the hospital within the last three years: Yes Total Score: 95 Level Of Care: New/Established - Level 3 Electronic Signature(s) Signed: 05/14/2020 5:38:33 PM By: Lorrin Jackson Entered By: Lorrin Jackson on 05/14/2020 13:04:03 -------------------------------------------------------------------------------- Encounter Discharge Information Details Patient Name: Date of Service: [name], [name] 05/14/2020 12:30 PM Medical Record Number: [id number] Patient Account Number: [id number] Date of Birth/Sex: Treating RN: Sep 21, 1950 (70 y.o. [name] Primary Care Ulysess Witz: Aura Dials Other Clinician: Referring Ethelbert Thain: Treating Sharlene Mccluskey/Extender: Cassell Clement in Treatment: 11 Encounter Discharge Information Items Discharge Condition: Stable Ambulatory Status: Ambulatory Discharge Destination: Home Transportation: Private Auto Accompanied By: self Schedule Follow-up Appointment: Yes Clinical Summary of Care: Patient Declined Electronic Signature(s) Signed: 05/14/2020 5:36:09 PM By: Rhae Hammock RN Entered By: Rhae Hammock on 05/14/2020 13:16:03 -------------------------------------------------------------------------------- [place] Details Patient Name: Date of Service: [name], [name] 05/14/2020 12:30 PM Medical Record Number: [id number] Patient Account Number: [id number] Date of Birth/Sex: Treating RN: [birth year] (70 y.o. Alan Wang Primary Care Arthea Nobel: Aura Dials Other Clinician: Referring Hoa Briggs: Treating Halo Shevlin/Extender: Cassell Clement in Treatment: Irwin reviewed with physician Active Inactive Abuse / Safety / Falls / Self Care Management Nursing Diagnoses: Potential for falls Goals: Patient will not experience any injury related to falls Date Initiated: 02/21/2020 Target Resolution Date: 06/11/2020 Goal Status: Active Patient/caregiver will verbalize/demonstrate measure taken to improve self care Date Initiated: 02/21/2020 Date Inactivated: 04/16/2020 Target Resolution Date: 03/14/2020 Goal Status: Met Interventions: Provide education on basic hygiene Provide education on fall prevention Notes: Pain, Acute or Chronic Nursing Diagnoses: Pain, acute or chronic: actual or potential Potential alteration in comfort, pain Goals: Patient will verbalize adequate pain control and receive pain control interventions during procedures as needed Date Initiated: 02/21/2020 Target Resolution Date: 06/11/2020 Goal Status: Active Patient/caregiver will verbalize comfort level met Date Initiated: 02/21/2020 Target Resolution Date:  06/11/2020 Goal Status: Active Interventions: Encourage patient to take pain medications as prescribed Provide education on pain management Treatment Activities: Administer pain control measures as ordered : 02/21/2020 Notes: Wound/Skin Impairment Nursing Diagnoses: Knowledge deficit related to ulceration/compromised skin integrity Goals: Patient/caregiver will verbalize understanding of skin care regimen Date Initiated: 02/21/2020 Target Resolution Date: 06/11/2020 Goal Status: Active Interventions: Assess patient/caregiver ability to obtain necessary supplies Assess patient/caregiver ability to perform ulcer/skin care regimen upon  admission and as needed Provide education on ulcer and skin care Treatment Activities: Skin care regimen initiated : 02/21/2020 Topical wound management initiated : 02/21/2020 Notes: Electronic Signature(s) Signed: 05/16/2020 6:00:02 PM By: Baruch Gouty RN, BSN Entered By: Baruch Gouty on 05/14/2020 12:39:04 -------------------------------------------------------------------------------- Pain Assessment Details Patient Name: Date of Service: Alan Morton D. 05/14/2020 12:30 PM Medical Record Number: [id number] Patient Account Number: [id number] Date of Birth/Sex: Treating RN: [birth year] (70 y.o. Alan Wang Primary Care Argentina Kosch: Aura Dials Other Clinician: Referring Khing Belcher: Treating Elisse Pennick/Extender: Cassell Clement in Treatment: 11 Active Problems Location of Pain Severity and Description of Pain Patient Has Paino No Patient Has Paino No Site Locations Pain Management and Medication Current Pain Management: Electronic Signature(s) Signed: 05/14/2020 4:22:28 PM By: Sandre Kitty Signed: 05/16/2020 6:00:02 PM By: Baruch Gouty RN, BSN Entered By: Sandre Kitty on 05/14/2020 12:37:08 -------------------------------------------------------------------------------- Patient/Caregiver Education  Details Patient Name: Date of Service: Alan Morton D. 3/9/2022andnbsp12:30 PM Medical Record Number: [id number] Patient Account Number: [id number] Date of Birth/Gender: Treating RN: 01/06/1951 (70 y.o. Alan Wang Primary Care Physician: Aura Dials Other Clinician: Referring Physician: Treating Physician/Extender: Cassell Clement in Treatment: 11 Education Assessment Education Provided To: Patient Education Topics Provided Pain: Methods: Explain/Verbal Responses: Reinforcements needed, State content correctly Wound/Skin Impairment: Methods: Explain/Verbal Responses: Reinforcements needed, State content correctly Electronic Signature(s) Signed: 05/16/2020 6:00:02 PM By: Baruch Gouty RN, BSN Entered By: Baruch Gouty on 05/14/2020 12:39:59 -------------------------------------------------------------------------------- Wound Assessment Details Patient Name: Date of Service: Alan Morton D. 05/14/2020 12:30 PM Medical Record Number: [id number] Patient Account Number: [id number] Date of Birth/Sex: Treating RN: November 08, 1950 (70 y.o. Alan Wang Primary Care Lesa Vandall: Aura Dials Other Clinician: Referring Saud Bail: Treating Shelton Soler/Extender: Cassell Clement in Treatment: 11 Wound Status Wound Number: 1 Primary Inflammatory Etiology: Wound Location: Coccyx Wound Status: Open Wounding Event: Gradually Appeared Comorbid Asthma, Coronary Artery Disease, Hypertension, Type II Date Acquired: 12/10/2019 History: Diabetes Weeks Of Treatment: 11 Clustered Wound: No Photos Wound Measurements Length: (cm) 0.5 Width: (cm) 0.3 Depth: (cm) 0.1 Area: (cm) 0.118 Volume: (cm) 0.012 % Reduction in Area: 99.2% % Reduction in Volume: 99.2% Epithelialization: Medium (34-66%) Tunneling: No Undermining: No Wound Description Classification: Full Thickness Without Exposed Support Structures Wound Margin: Flat  and Intact Exudate Amount: Medium Exudate Type: Serous Exudate Color: amber Foul Odor After Cleansing: No Slough/Fibrino No Wound Bed Granulation Amount: Large (67-100%) Exposed Structure Granulation Quality: Pale Fascia Exposed: No Necrotic Amount: None Present (0%) Fat Layer (Subcutaneous Tissue) Exposed: No Tendon Exposed: No Muscle Exposed: No Joint Exposed: No Bone Exposed: No Treatment Notes Wound #1 (Coccyx) Cleanser Soap and Water Discharge Instruction: May shower and wash wound with dial antibacterial soap and water prior to dressing change. Peri-Wound Care Topical Nystatin Powder Discharge Instruction: sprinkle lightly on affected gluteus and perianal area, leave open to air Primary Dressing Secondary Dressing Secured With Compression Wrap Compression Stockings Add-Ons Electronic Signature(s) Signed: 05/16/2020 10:32:25 AM By: Sandre Kitty Signed: 05/16/2020 6:00:02 PM By: Baruch Gouty RN, BSN Previous Signature: 05/14/2020 5:36:09 PM Version By: Rhae Hammock RN Entered By: Sandre Kitty on 05/15/2020 09:47:56 -------------------------------------------------------------------------------- [place] Details Patient Name: Date of Service: [name], [name] 05/14/2020 12:30 PM Medical Record Number: [id number] Patient Account Number: [id number] Date of Birth/Sex: Treating RN: 14-Oct-1950 (70 y.o. Alan Wang Primary Care Lyncoln Ledgerwood: Aura Dials Other Clinician: Referring Raymundo Rout: Treating Jolita Haefner/Extender: Cassell Clement in Treatment: 11 Vital Signs Time  Taken: 12:36 Temperature (F): 98.3 Height (in): 70 Pulse (bpm): 73 Weight (lbs): 204 Respiratory Rate (breaths/min): 16 Body Mass Index (BMI): 29.3 Blood Pressure (mmHg): 126/80 Capillary Blood Glucose (mg/dl): 97 Reference Range: 80 - 120 mg / dl Electronic Signature(s) Signed: 05/14/2020 4:22:28 PM By: Sandre Kitty Entered By: Sandre Kitty on  05/14/2020 12:37:02

## 2020-05-21 DIAGNOSIS — E785 Hyperlipidemia, unspecified: Secondary | ICD-10-CM | POA: Diagnosis not present

## 2020-05-21 DIAGNOSIS — M150 Primary generalized (osteo)arthritis: Secondary | ICD-10-CM | POA: Diagnosis not present

## 2020-05-21 DIAGNOSIS — I10 Essential (primary) hypertension: Secondary | ICD-10-CM | POA: Diagnosis not present

## 2020-05-21 DIAGNOSIS — N401 Enlarged prostate with lower urinary tract symptoms: Secondary | ICD-10-CM | POA: Diagnosis not present

## 2020-05-21 DIAGNOSIS — I252 Old myocardial infarction: Secondary | ICD-10-CM | POA: Diagnosis not present

## 2020-05-21 DIAGNOSIS — E119 Type 2 diabetes mellitus without complications: Secondary | ICD-10-CM | POA: Diagnosis not present

## 2020-05-21 DIAGNOSIS — I2510 Atherosclerotic heart disease of native coronary artery without angina pectoris: Secondary | ICD-10-CM | POA: Diagnosis not present

## 2020-05-23 DIAGNOSIS — Z794 Long term (current) use of insulin: Secondary | ICD-10-CM | POA: Diagnosis not present

## 2020-05-23 DIAGNOSIS — Z125 Encounter for screening for malignant neoplasm of prostate: Secondary | ICD-10-CM | POA: Diagnosis not present

## 2020-05-23 DIAGNOSIS — E785 Hyperlipidemia, unspecified: Secondary | ICD-10-CM | POA: Diagnosis not present

## 2020-05-23 DIAGNOSIS — Z0000 Encounter for general adult medical examination without abnormal findings: Secondary | ICD-10-CM | POA: Diagnosis not present

## 2020-05-23 DIAGNOSIS — I2510 Atherosclerotic heart disease of native coronary artery without angina pectoris: Secondary | ICD-10-CM | POA: Diagnosis not present

## 2020-05-23 DIAGNOSIS — I10 Essential (primary) hypertension: Secondary | ICD-10-CM | POA: Diagnosis not present

## 2020-05-23 DIAGNOSIS — E119 Type 2 diabetes mellitus without complications: Secondary | ICD-10-CM | POA: Diagnosis not present

## 2020-05-23 DIAGNOSIS — M150 Primary generalized (osteo)arthritis: Secondary | ICD-10-CM | POA: Diagnosis not present

## 2020-05-28 ENCOUNTER — Other Ambulatory Visit: Payer: Self-pay

## 2020-05-28 ENCOUNTER — Encounter (HOSPITAL_BASED_OUTPATIENT_CLINIC_OR_DEPARTMENT_OTHER): Payer: Medicare HMO | Admitting: Physician Assistant

## 2020-05-28 DIAGNOSIS — S91309D Unspecified open wound, unspecified foot, subsequent encounter: Secondary | ICD-10-CM | POA: Diagnosis not present

## 2020-05-28 DIAGNOSIS — M069 Rheumatoid arthritis, unspecified: Secondary | ICD-10-CM | POA: Diagnosis not present

## 2020-05-28 DIAGNOSIS — E1151 Type 2 diabetes mellitus with diabetic peripheral angiopathy without gangrene: Secondary | ICD-10-CM | POA: Diagnosis not present

## 2020-05-28 DIAGNOSIS — L98498 Non-pressure chronic ulcer of skin of other sites with other specified severity: Secondary | ICD-10-CM | POA: Diagnosis not present

## 2020-05-28 DIAGNOSIS — E11622 Type 2 diabetes mellitus with other skin ulcer: Secondary | ICD-10-CM | POA: Diagnosis not present

## 2020-05-28 NOTE — Progress Notes (Addendum)
[name] ([id number]) , Visit Report for 05/28/2020 Chief Complaint Document Details Patient Name: Date of Service: Alan Morton D. 05/28/2020 12:30 PM Medical Record Number: [id number] Patient Account Number: [id number] Date of Birth/Sex: Treating RN: [birth year] (70 y.o. [name] Primary Care Provider: Aura Dials Other Clinician: Referring Provider: Treating Provider/Extender: Cassell Clement in Treatment: 13 Information Obtained from: Patient Chief Complaint Wound exam; patient is here for review of his skin condition in his coccyx area and surrounding buttock Electronic Signature(s) Signed: 05/28/2020 1:06:50 PM By: Worthy Keeler PA-C Entered By: Worthy Keeler on 05/28/2020 13:06:49 -------------------------------------------------------------------------------- HPI Details Patient Name: Date of Service: Alan Wang, Alan Pray D. 05/28/2020 12:30 PM Medical Record Number: [id number] Patient Account Number: [id number] Date of Birth/Sex: Treating RN: 1951/01/30 (70 y.o. [name] Primary Care Provider: Aura Dials Other Clinician: Referring Provider: Treating Provider/Extender: Cassell Clement in Treatment: 13 History of Present Illness HPI Description: ADMISSION 02/21/2020 This is a 70 year old man with type 2 diabetes well controlled. For the last 3 months he has had a difficult painful/burning area in his coccyx extending between the gluteal cleft. He went to see his primary doctor who probably prescribed nystatin powder although they never managed to pick this up. They have been using something called [place] salve and paradoxically the areas gotten a lot better. There are small open areas but apparently one point these were quite a bit larger. Past medical history includes coronary artery disease, type 2 diabetes,o Rheumatoid arthritis, history of skin cancer in his left upper arm, asthma, CVA 12/23 he  arrives in clinic not receiving any supplies from [place]. This is apparently tangled up and fed ex. I also ordered ketoconazole for him for the remaining intertrigo however he does not seem to have gotten this either. Readmission: 04/16/2020 upon evaluation today patient appears for reevaluation here in the clinic although he was last seen on December 16 by Dr. Dellia Nims. At that time it was thought he may have a fungal type infection he did actually have ketoconazole cream prescribed to him. He did get this cream and has been using it according to the patient and his wife. Unfortunately though is not having as much drainage as he did he does still have a rash that is very irritating around the sacral and perianal region. There does not appear to be any signs of active infection at this time which is great news. 05/14/2020 upon evaluation today patient appears to be doing well with regard to his wound currently. He has been tolerating the dressing changes without complication. Fortunately I think that he is better than last time I saw him. I feel that the nystatin powder is helping. With that being said I do believe that he is going require some slight changes in his treatment regimen to try to get things under better control here. 05/28/2020 on evaluation today patient appears to be doing well with regard to the midline sacral region. There does not appear to be any evidence of infection which is great news. In fact this appears to be closed but he has a wound over on the side more to the right that is open currently. Fortunately there is no evidence of active infection at this time which is great news. I do not believe he needs the nystatin powder any longer. Electronic Signature(s) Signed: 05/28/2020 1:20:49 PM By: Worthy Keeler PA-C Entered By: Worthy Keeler on 05/28/2020 13:20:49 -------------------------------------------------------------------------------- Physical  Exam Details Patient Name: Date  of ServiceBobbye Morton D. 05/28/2020 12:30 PM Medical Record Number: [id number] Patient Account Number: [id number] Date of Birth/Sex: Treating RN: Jul 12, 1950 (70 y.o. [name] Primary Care Provider: Aura Dials Other Clinician: Referring Provider: Treating Provider/Extender: Cassell Clement in Treatment: 37 Constitutional Well-nourished and well-hydrated in no acute distress. Respiratory normal breathing without difficulty. Psychiatric this patient is able to make decisions and demonstrates good insight into disease process. Alert and Oriented x 3. pleasant and cooperative. Notes Upon inspection patient's wound bed actually showed signs of good granulation epithelization at this point. With that being said he does have an area that appears to have opened on the right gluteal region just to the side of the sacral area but again this is all in the same main region. I do not think he needs a nystatin powder any longer I really think this is all related to pressure. He has been sit on a doughnut pillow but again this is probably not staying in place where it needs to be. Of note I did perform a saline gauze Mechanical debridement of the new wound on the right gluteal region. This again is part of the original wound but nonetheless does seem to be in someway separate here. I do think that post debridement I was able to remove some of the superficial biofilm from this area as well as any slough remaining. It appears to be doing well and I am hopeful that we can get things under good control here. Electronic Signature(s) Signed: 05/28/2020 1:22:33 PM By: Worthy Keeler PA-C Previous Signature: 05/28/2020 1:21:18 PM Version By: Worthy Keeler PA-C Entered By: Worthy Keeler on 05/28/2020 13:22:33 -------------------------------------------------------------------------------- Physician Orders Details Patient Name: Date of Service: Alan Morton D.  05/28/2020 12:30 PM Medical Record Number: [id number] Patient Account Number: [id number] Date of Birth/Sex: Treating RN: 1950/11/13 (70 y.o. [name] Primary Care Provider: Aura Dials Other Clinician: Referring Provider: Treating Provider/Extender: Cassell Clement in Treatment: [id number] Verbal / Phone Orders: No Diagnosis Coding ICD-10 Coding Code Description B35.6 Tinea cruris L98.498 Non-pressure chronic ulcer of skin of other sites with other specified severity Follow-up Appointments Return Appointment in 2 weeks. Bathing/ Shower/ Hygiene May shower and wash wound with soap and water. Off-Loading Turn and reposition every 2 hours - while laying down. ensure while sitting to adjust/move to offload pressure to affected area. Wound Treatment Wound #1 - Coccyx Cleanser: Soap and Water Every Other Day/30 Days Discharge Instructions: May shower and wash wound with dial antibacterial soap and water prior to dressing change. Prim Dressing: KerraCel Ag Gelling Fiber Dressing, 2x2 in (silver alginate) (DME) (Dispense As Written) Every Other Day/30 Days ary Discharge Instructions: Apply silver alginate to wound bed as instructed Secondary Dressing: ComfortFoam Border, 3x3 in (silicone border) (DME) (Generic) Every Other Day/30 Days Discharge Instructions: Apply over primary dressing as directed. Electronic Signature(s) Signed: 05/28/2020 5:29:52 PM By: Worthy Keeler PA-C Signed: 05/28/2020 5:47:10 PM By: Baruch Gouty RN, BSN Entered By: Baruch Gouty on 05/28/2020 13:16:13 -------------------------------------------------------------------------------- Problem List Details Patient Name: Date of Service: Alan Wang, Alan Pray D. 05/28/2020 12:30 PM Medical Record Number: [id number] Patient Account Number: [id number] Date of Birth/Sex: Treating RN: Aug 31, 1950 (70 y.o. [name] Primary Care Provider: Aura Dials Other Clinician: Referring  Provider: Treating Provider/Extender: Cassell Clement in Treatment: 13 Active Problems ICD-10 Encounter Code Description Active Date MDM Diagnosis B35.6  Tinea cruris 02/21/2020 No Yes L98.498 Non-pressure chronic ulcer of skin of other sites with other specified severity 02/21/2020 No Yes Inactive Problems Resolved Problems Electronic Signature(s) Signed: 05/28/2020 1:06:41 PM By: Worthy Keeler PA-C Entered By: Worthy Keeler on 05/28/2020 13:06:41 -------------------------------------------------------------------------------- Progress Note Details Patient Name: Date of Service: Alan Morton D. 05/28/2020 12:30 PM Medical Record Number: [id number] Patient Account Number: [id number] Date of Birth/Sex: Treating RN: 1950/12/14 (70 y.o. [name] Primary Care Provider: Aura Dials Other Clinician: Referring Provider: Treating Provider/Extender: Cassell Clement in Treatment: 13 Subjective Chief Complaint Information obtained from Patient Wound exam; patient is here for review of his skin condition in his coccyx area and surrounding buttock History of Present Illness (HPI) ADMISSION 02/21/2020 This is a 70 year old man with type 2 diabetes well controlled. For the last 3 months he has had a difficult painful/burning area in his coccyx extending between the gluteal cleft. He went to see his primary doctor who probably prescribed nystatin powder although they never managed to pick this up. They have been using something called [place] salve and paradoxically the areas gotten a lot better. There are small open areas but apparently one point these were quite a bit larger. Past medical history includes coronary artery disease, type 2 diabetes,o Rheumatoid arthritis, history of skin cancer in his left upper arm, asthma, CVA 12/23 he arrives in clinic not receiving any supplies from [place]. This is apparently tangled up and fed  ex. I also ordered ketoconazole for him for the remaining intertrigo however he does not seem to have gotten this either. Readmission: 04/16/2020 upon evaluation today patient appears for reevaluation here in the clinic although he was last seen on December 16 by Dr. Dellia Nims. At that time it was thought he may have a fungal type infection he did actually have ketoconazole cream prescribed to him. He did get this cream and has been using it according to the patient and his wife. Unfortunately though is not having as much drainage as he did he does still have a rash that is very irritating around the sacral and perianal region. There does not appear to be any signs of active infection at this time which is great news. 05/14/2020 upon evaluation today patient appears to be doing well with regard to his wound currently. He has been tolerating the dressing changes without complication. Fortunately I think that he is better than last time I saw him. I feel that the nystatin powder is helping. With that being said I do believe that he is going require some slight changes in his treatment regimen to try to get things under better control here. 05/28/2020 on evaluation today patient appears to be doing well with regard to the midline sacral region. There does not appear to be any evidence of infection which is great news. In fact this appears to be closed but he has a wound over on the side more to the right that is open currently. Fortunately there is no evidence of active infection at this time which is great news. I do not believe he needs the nystatin powder any longer. Objective Constitutional Well-nourished and well-hydrated in no acute distress. Vitals Time Taken: 12:56 PM, Height: 70 in, Weight: 204 lbs, BMI: 29.3, Temperature: 98.7 F, Pulse: 75 bpm, Respiratory Rate: 18 breaths/min, Blood Pressure: 117/64 mmHg, Capillary Blood Glucose: 120 mg/dl. General Notes: glucose per pt  report Respiratory normal breathing without difficulty. Psychiatric this patient is able to make  decisions and demonstrates good insight into disease process. Alert and Oriented x 3. pleasant and cooperative. General Notes: Upon inspection patient's wound bed actually showed signs of good granulation epithelization at this point. With that being said he does have an area that appears to have opened on the right gluteal region just to the side of the sacral area but again this is all in the same main region. I do not think he needs a nystatin powder any longer I really think this is all related to pressure. He has been sit on a doughnut pillow but again this is probably not staying in place where it needs to be. Of note I did perform a saline gauze Mechanical debridement of the new wound on the right gluteal region. This again is part of the original wound but nonetheless does seem to be in someway separate here. I do think that post debridement I was able to remove some of the superficial biofilm from this area as well as any slough remaining. It appears to be doing well and I am hopeful that we can get things under good control here. Integumentary (Hair, Skin) Wound #1 status is Open. Original cause of wound was Gradually Appeared. The date acquired was: 12/10/2019. The wound has been in treatment 13 weeks. The wound is located on the Coccyx. The wound measures 2.5cm length x 1cm width x 0.1cm depth; 1.963cm^2 area and 0.196cm^3 volume. There is no tunneling or undermining noted. There is a medium amount of serous drainage noted. The wound margin is indistinct and nonvisible. There is large (67-100%) pink, pale granulation within the wound bed. There is no necrotic tissue within the wound bed. Assessment Active Problems ICD-10 Tinea cruris Non-pressure chronic ulcer of skin of other sites with other specified severity Plan Follow-up Appointments: Return Appointment in 2 weeks. Bathing/  Shower/ Hygiene: May shower and wash wound with soap and water. Off-Loading: Turn and reposition every 2 hours - while laying down. ensure while sitting to adjust/move to offload pressure to affected area. WOUND #1: - Coccyx Wound Laterality: Cleanser: Soap and Water Every Other Day/30 Days Discharge Instructions: May shower and wash wound with dial antibacterial soap and water prior to dressing change. Prim Dressing: KerraCel Ag Gelling Fiber Dressing, 2x2 in (silver alginate) (DME) (Dispense As Written) Every Other Day/30 Days ary Discharge Instructions: Apply silver alginate to wound bed as instructed Secondary Dressing: ComfortFoam Border, 3x3 in (silicone border) (DME) (Generic) Every Other Day/30 Days Discharge Instructions: Apply over primary dressing as directed. 1. Would recommend currently that we going continue with wound care measures as before and the patient is in agreement with the plan. This includes the use of a silver alginate dressing which were to go back to currently. 2. I am also can recommend a border foam dressing to cover for protection the should also avoid any skin tape injury which also could been part of the issue here as well. We will see patient back for reevaluation in 2 weeks here in the clinic. If anything worsens or changes patient will contact our office for additional recommendations. Electronic Signature(s) Signed: 05/28/2020 1:23:03 PM By: Worthy Keeler PA-C Previous Signature: 05/28/2020 1:21:42 PM Version By: Worthy Keeler PA-C Entered By: Worthy Keeler on 05/28/2020 13:23:02 -------------------------------------------------------------------------------- SuperBill Details Patient Name: Date of Service: Alan Wang, Alan Pray D. 05/28/2020 Medical Record Number: [id number] Patient Account Number: [id number] Date of Birth/Sex: Treating RN: 19-Feb-1951 (71 y.o. [name] Primary Care Provider: Aura Dials Other Clinician:  Referring  Provider: Treating Provider/Extender: Cassell Clement in Treatment: 13 Diagnosis Coding ICD-10 Codes Code Description B35.6 Tinea cruris L98.498 Non-pressure chronic ulcer of skin of other sites with other specified severity Facility Procedures CPT4 Code: [id number] Description: 99213 - WOUND CARE VISIT-LEV 3 EST PT Modifier: Quantity: 1 Physician Procedures : CPT4 Code Description Modifier [id number] - WC PHYS LEVEL 3 - EST PT ICD-10 Diagnosis Description B35.6 Tinea cruris L98.498 Non-pressure chronic ulcer of skin of other sites with other specified severity Quantity: 1 Electronic Signature(s) Signed: 05/28/2020 1:23:12 PM By: Worthy Keeler PA-C Previous Signature: 05/28/2020 1:21:52 PM Version By: Worthy Keeler PA-C Previous Signature: 05/28/2020 1:21:52 PM Version By: Worthy Keeler PA-C Entered By: Worthy Keeler on 05/28/2020 13:23:12

## 2020-05-29 NOTE — Progress Notes (Signed)
Alan Wang ([id number]) , Visit Report for 05/28/2020 Arrival Information Details Patient Name: Date of Service: Alan Morton D. 05/28/2020 12:30 PM Medical Record Number: [id number] Patient Account Number: [id number] Date of Birth/Sex: Treating RN: 1950-06-28 (70 y.o. Janyth Contes Primary Care Sahalie Beth: Aura Dials Other Clinician: Referring Demichael Traum: Treating Paidyn Mcferran/Extender: Cassell Clement in Treatment: 2 Visit Information History Since Last Visit Added or deleted any medications: No Patient Arrived: Ambulatory Any new allergies or adverse reactions: No Arrival Time: 12:56 Had a fall or experienced change in No Accompanied By: wife activities of daily living that may affect Transfer Assistance: None risk of falls: Patient Identification Verified: Yes Signs or symptoms of abuse/neglect since last visito No Secondary Verification Process Completed: Yes Hospitalized since last visit: No Patient Requires Transmission-Based Precautions: No Implantable device outside of the clinic excluding No Patient Has Alerts: No cellular tissue based products placed in the center since last visit: Has Dressing in Place as Prescribed: Yes Pain Present Now: Yes Electronic Signature(s) Signed: 05/29/2020 5:04:27 PM By: Levan Hurst RN, BSN Entered By: Levan Hurst on 05/28/2020 12:58:52 -------------------------------------------------------------------------------- Clinic Level of Care Assessment Details Patient Name: Date of Service: Alan Morton D. 05/28/2020 12:30 PM Medical Record Number: [id number] Patient Account Number: [id number] Date of Birth/Sex: Treating RN: Jun 06, 1950 (70 y.o. [name] Primary Care Zitlaly Malson: Aura Dials Other Clinician: Referring Shikara Mcauliffe: Treating Hanalei Glace/Extender: Cassell Clement in Treatment: 13 Clinic Level of Care Assessment Items TOOL 4 Quantity Score _0  - 0 Use when only  an EandM is performed on FOLLOW-UP visit ASSESSMENTS - Nursing Assessment / Reassessment X- 1 10 Reassessment of Co-morbidities (includes updates in patient status) X- 1 5 Reassessment of Adherence to Treatment Plan ASSESSMENTS - Wound and Skin A ssessment / Reassessment X - Simple Wound Assessment / Reassessment - one wound 1 5 _1  - 0 Complex Wound Assessment / Reassessment - multiple wounds _2  - 0 Dermatologic / Skin Assessment (not related to wound area) ASSESSMENTS - Focused Assessment _3  - 0 Circumferential Edema Measurements - multi extremities _4  - 0 Nutritional Assessment / Counseling / Intervention _5  - 0 Lower Extremity Assessment (monofilament, tuning fork, pulses) _6  - 0 Peripheral Arterial Disease Assessment (using hand held doppler) ASSESSMENTS - Ostomy and/or Continence Assessment and Care _7  - 0 Incontinence Assessment and Management _8  - 0 Ostomy Care Assessment and Management (repouching, etc.) PROCESS - Coordination of Care X - Simple Patient / Family Education for ongoing care 1 15 _9  - 0 Complex (extensive) Patient / Family Education for ongoing care X- 1 10 Staff obtains Programmer, systems, Records, T Results / Process Orders est _10  - 0 Staff telephones HHA, Nursing Homes / Clarify orders / etc _11  - 0 Routine Transfer to another Facility (non-emergent condition) _12  - 0 Routine Hospital Admission (non-emergent condition) _13  - 0 New Admissions / Biomedical engineer / Ordering NPWT Apligraf, etc. , _14  - 0 Emergency Hospital Admission (emergent condition) X- 1 10 Simple Discharge Coordination _15  - 0 Complex (extensive) Discharge Coordination PROCESS - Special Needs _16  - 0 Pediatric / Minor Patient Management _17  - 0 Isolation Patient Management _18  - 0 Hearing / Language / Visual special needs _19  - 0 Assessment of Community assistance (transportation, D/C planning, etc.) _20  - 0 Additional assistance / Altered mentation _21  - 0 Support Surface(s)  Assessment (bed, cushion, seat, etc.) INTERVENTIONS - Wound Cleansing / Measurement X - Simple Wound Cleansing - one wound 1 5 _22  - 0 Complex  Wound Cleansing - multiple wounds X- 1 5 Wound Imaging (photographs - any number of wounds) _0  - 0 Wound Tracing (instead of photographs) X- 1 5 Simple Wound Measurement - one wound _1  - 0 Complex Wound Measurement - multiple wounds INTERVENTIONS - Wound Dressings X - Small Wound Dressing one or multiple wounds 1 10 _2  - 0 Medium Wound Dressing one or multiple wounds _3  - 0 Large Wound Dressing one or multiple wounds X- 1 5 Application of Medications - topical <[id number]>_1<\/[id number]>_4  - 0 Application of Medications - injection INTERVENTIONS - Miscellaneous _5  - 0 External ear exam _6  - 0 Specimen Collection (cultures, biopsies, blood, body fluids, etc.) _7  - 0 Specimen(s) / Culture(s) sent or taken to Lab for analysis _8  - 0 Patient Transfer (multiple staff / Civil Service fast streamer / Similar devices) _9  - 0 Simple Staple / Suture removal (25 or less) _10  - 0 Complex Staple / Suture removal (26 or more) _11  - 0 Hypo / Hyperglycemic Management (close monitor of Blood Glucose) _12  - 0 Ankle / Brachial Index (ABI) - do not check if billed separately X- 1 5 Vital Signs Has the patient been seen at the hospital within the last three years: Yes Total Score: 90 Level Of Care: New/Established - Level 3 Electronic Signature(s) Signed: 05/28/2020 5:47:10 PM By: Baruch Gouty RN, BSN Entered By: Baruch Gouty on 05/28/2020 13:11:38 -------------------------------------------------------------------------------- Encounter Discharge Information Details Patient Name: Date of Service: Alan Wang, Alan Pray D. 05/28/2020 12:30 PM Medical Record Number: [id number] Patient Account Number: [id number] Date of Birth/Sex: Treating RN: 06/29/1950 (70 y.o. Hessie Diener Primary Care Shivangi Lutz: Aura Dials Other Clinician: Referring Dontavion Noxon: Treating Onyx Edgley/Extender: Cassell Clement in Treatment: 13 Encounter Discharge Information Items Discharge Condition: Stable Ambulatory Status: Ambulatory Discharge Destination: Home Transportation: Private Auto Accompanied By: wife Schedule Follow-up Appointment: Yes Clinical Summary of Care: Electronic Signature(s) Signed: 05/28/2020 6:09:10 PM By: Deon Pilling Entered By: Deon Pilling on 05/28/2020 13:23:22 -------------------------------------------------------------------------------- [place] Details Patient Name: Date of Service: Alan Wang, Alan Pray D. 05/28/2020 12:30 PM Medical Record Number: [id number] Patient Account Number: [id number] Date of Birth/Sex: Treating RN: 05/10/1950 (70 y.o. [name] Primary Care Mahum Betten: Aura Dials Other Clinician: Referring Johnny Gorter: Treating Adlean Hardeman/Extender: Cassell Clement in Treatment: [place] reviewed with physician Active Inactive Abuse / Safety / Falls / Self Care Management Nursing Diagnoses: Potential for falls Goals: Patient will not experience any injury related to falls Date Initiated: 02/21/2020 Target Resolution Date: 06/11/2020 Goal Status: Active Patient/caregiver will verbalize/demonstrate measure taken to improve self care Date Initiated: 02/21/2020 Date Inactivated: 04/16/2020 Target Resolution Date: 03/14/2020 Goal Status: Met Interventions: Provide education on basic hygiene Provide education on fall prevention Notes: Pain, Acute or Chronic Nursing Diagnoses: Pain, acute or chronic: actual or potential Potential alteration in comfort, pain Goals: Patient will verbalize adequate pain control and receive pain control interventions during procedures as needed Date Initiated: 02/21/2020 Target Resolution Date: 06/11/2020 Goal Status: Active Patient/caregiver will verbalize comfort level met Date Initiated: 02/21/2020 Target Resolution Date:  06/11/2020 Goal Status: Active Interventions: Encourage patient to take pain medications as prescribed Provide education on pain management Treatment Activities: Administer pain control measures as ordered : 02/21/2020 Notes: Wound/Skin Impairment Nursing Diagnoses: Knowledge deficit related to ulceration/compromised skin integrity Goals: Patient/caregiver will verbalize understanding of skin care regimen Date Initiated: 02/21/2020 Target Resolution Date: 06/11/2020 Goal Status: Active Interventions: Assess patient/caregiver ability to obtain necessary supplies Assess patient/caregiver ability to perform ulcer/skin care  regimen upon admission and as needed Provide education on ulcer and skin care Treatment Activities: Skin care regimen initiated : 02/21/2020 Topical wound management initiated : 02/21/2020 Notes: Electronic Signature(s) Signed: 05/28/2020 5:47:10 PM By: Baruch Gouty RN, BSN Entered By: Baruch Gouty on 05/28/2020 13:09:59 -------------------------------------------------------------------------------- Pain Assessment Details Patient Name: Date of Service: Alan Morton D. 05/28/2020 12:30 PM Medical Record Number: [id number] Patient Account Number: [id number] Date of Birth/Sex: Treating RN: 29-Oct-1950 (70 y.o. Janyth Contes Primary Care Dontavian Marchi: Aura Dials Other Clinician: Referring Tanara Turvey: Treating Galilea Quito/Extender: Cassell Clement in Treatment: 13 Active Problems Location of Pain Severity and Description of Pain Patient Has Paino Yes Patient Has Paino Yes Site Locations Pain Location: Pain in Ulcers With Dressing Change: Yes Rate the pain. Current Pain Level: 5 Character of Pain Describe the Pain: Burning Pain Management and Medication Current Pain Management: Medication: Yes Cold Application: No Rest: No Massage: No Activity: No T.E.N.S.: No Heat Application: No Leg drop or elevation: No Is the Current  Pain Management Adequate: Adequate How does your wound impact your activities of daily livingo Sleep: No Bathing: No Appetite: No Relationship With Others: No Bladder Continence: No Emotions: No Bowel Continence: No Work: No Toileting: No Drive: No Dressing: No Hobbies: No Engineer, maintenance) Signed: 05/29/2020 5:04:27 PM By: Levan Hurst RN, BSN Entered By: Levan Hurst on 05/28/2020 13:03:03 -------------------------------------------------------------------------------- Patient/Caregiver Education Details Patient Name: Date of Service: Alan Morton D. 3/23/2022andnbsp12:30 PM Medical Record Number: [id number] Patient Account Number: [id number] Date of Birth/Gender: Treating RN: [birth year] (69 y.o. [name] Primary Care Physician: Aura Dials Other Clinician: Referring Physician: Treating Physician/Extender: Cassell Clement in Treatment: 13 Education Assessment Education Provided To: Patient Education Topics Provided Pressure: Methods: Explain/Verbal Responses: Reinforcements needed, State content correctly Wound/Skin Impairment: Methods: Explain/Verbal Responses: Reinforcements needed, State content correctly Electronic Signature(s) Signed: 05/28/2020 5:47:10 PM By: Baruch Gouty RN, BSN Entered By: Baruch Gouty on 05/28/2020 13:10:23 -------------------------------------------------------------------------------- Wound Assessment Details Patient Name: Date of Service: Alan Morton D. 05/28/2020 12:30 PM Medical Record Number: [id number] Patient Account Number: [id number] Date of Birth/Sex: Treating RN: 1950-04-10 (69 y.o. Janyth Contes Primary Care Tiawanna Luchsinger: Aura Dials Other Clinician: Referring Armenta Erskin: Treating Charniece Venturino/Extender: Cassell Clement in Treatment: 13 Wound Status Wound Number: 1 Primary Inflammatory Etiology: Wound Location: Coccyx Wound Status:  Open Wounding Event: Gradually Appeared Comorbid Asthma, Coronary Artery Disease, Hypertension, Type II Date Acquired: 12/10/2019 History: Diabetes Weeks Of Treatment: 13 Clustered Wound: No Photos Wound Measurements Length: (cm) 2.5 Width: (cm) 1 Depth: (cm) 0.1 Area: (cm) 1.963 Volume: (cm) 0.196 % Reduction in Area: 87.5% % Reduction in Volume: 87.5% Epithelialization: Medium (34-66%) Tunneling: No Undermining: No Wound Description Classification: Full Thickness Without Exposed Support Structures Wound Margin: Indistinct, nonvisible Exudate Amount: Medium Exudate Type: Serous Exudate Color: amber Foul Odor After Cleansing: No Slough/Fibrino No Wound Bed Granulation Amount: Large (67-100%) Exposed Structure Granulation Quality: Pink, Pale Fascia Exposed: No Necrotic Amount: None Present (0%) Fat Layer (Subcutaneous Tissue) Exposed: No Tendon Exposed: No Muscle Exposed: No Joint Exposed: No Bone Exposed: No Treatment Notes Wound #1 (Coccyx) Cleanser Soap and Water Discharge Instruction: May shower and wash wound with dial antibacterial soap and water prior to dressing change. Peri-Wound Care Topical Primary Dressing KerraCel Ag Gelling Fiber Dressing, 2x2 in (silver alginate) Discharge Instruction: Apply silver alginate to wound bed as instructed Secondary Dressing ComfortFoam Border, 3x3 in (silicone border) Discharge Instruction: Apply over primary dressing as  directed. Secured With Compression Wrap Compression Stockings Environmental education officer) Signed: 05/29/2020 9:47:55 AM By: Sandre Kitty Signed: 05/29/2020 5:04:27 PM By: Levan Hurst RN, BSN Entered By: Sandre Kitty on 05/28/2020 16:07:53 -------------------------------------------------------------------------------- Vitals Details Patient Name: Date of Service: Alan Wang, Alan Pray D. 05/28/2020 12:30 PM Medical Record Number: [id number] Patient Account Number: [id number] Date of  Birth/Sex: Treating RN: 07/08/1950 (70 y.o. Janyth Contes Primary Care Delfin Squillace: Aura Dials Other Clinician: Referring Ruthmary Occhipinti: Treating Mishaal Lansdale/Extender: Cassell Clement in Treatment: 13 Vital Signs Time Taken: 12:56 Temperature (F): 98.7 Height (in): 70 Pulse (bpm): 75 Weight (lbs): 204 Respiratory Rate (breaths/min): 18 Body Mass Index (BMI): 29.3 Blood Pressure (mmHg): 117/64 Capillary Blood Glucose (mg/dl): 120 Reference Range: 80 - 120 mg / dl Notes glucose per pt report Electronic Signature(s) Signed: 05/29/2020 5:04:27 PM By: Levan Hurst RN, BSN Entered By: Levan Hurst on 05/28/2020 13:02:46

## 2020-06-10 DIAGNOSIS — E1165 Type 2 diabetes mellitus with hyperglycemia: Secondary | ICD-10-CM | POA: Diagnosis not present

## 2020-06-10 DIAGNOSIS — M069 Rheumatoid arthritis, unspecified: Secondary | ICD-10-CM | POA: Diagnosis not present

## 2020-06-10 DIAGNOSIS — G8929 Other chronic pain: Secondary | ICD-10-CM | POA: Diagnosis not present

## 2020-06-10 DIAGNOSIS — Z008 Encounter for other general examination: Secondary | ICD-10-CM | POA: Diagnosis not present

## 2020-06-10 DIAGNOSIS — E1159 Type 2 diabetes mellitus with other circulatory complications: Secondary | ICD-10-CM | POA: Diagnosis not present

## 2020-06-10 DIAGNOSIS — K5900 Constipation, unspecified: Secondary | ICD-10-CM | POA: Diagnosis not present

## 2020-06-10 DIAGNOSIS — I2510 Atherosclerotic heart disease of native coronary artery without angina pectoris: Secondary | ICD-10-CM | POA: Diagnosis not present

## 2020-06-10 DIAGNOSIS — J309 Allergic rhinitis, unspecified: Secondary | ICD-10-CM | POA: Diagnosis not present

## 2020-06-10 DIAGNOSIS — I10 Essential (primary) hypertension: Secondary | ICD-10-CM | POA: Diagnosis not present

## 2020-06-10 DIAGNOSIS — E785 Hyperlipidemia, unspecified: Secondary | ICD-10-CM | POA: Diagnosis not present

## 2020-06-10 DIAGNOSIS — Z794 Long term (current) use of insulin: Secondary | ICD-10-CM | POA: Diagnosis not present

## 2020-06-11 ENCOUNTER — Encounter (HOSPITAL_BASED_OUTPATIENT_CLINIC_OR_DEPARTMENT_OTHER): Payer: Medicare HMO | Attending: Physician Assistant | Admitting: Physician Assistant

## 2020-06-11 ENCOUNTER — Other Ambulatory Visit: Payer: Self-pay

## 2020-06-11 DIAGNOSIS — L98412 Non-pressure chronic ulcer of buttock with fat layer exposed: Secondary | ICD-10-CM | POA: Diagnosis not present

## 2020-06-11 DIAGNOSIS — E11622 Type 2 diabetes mellitus with other skin ulcer: Secondary | ICD-10-CM | POA: Diagnosis not present

## 2020-06-11 DIAGNOSIS — L98499 Non-pressure chronic ulcer of skin of other sites with unspecified severity: Secondary | ICD-10-CM | POA: Diagnosis not present

## 2020-06-11 DIAGNOSIS — L98498 Non-pressure chronic ulcer of skin of other sites with other specified severity: Secondary | ICD-10-CM | POA: Diagnosis not present

## 2020-06-11 DIAGNOSIS — B356 Tinea cruris: Secondary | ICD-10-CM | POA: Insufficient documentation

## 2020-06-11 NOTE — Progress Notes (Addendum)
KAELEM BRACH ([id number]) , Visit Report for 06/11/2020 Chief Complaint Document Details Patient Name: Date of Service: Alan Morton D. 06/11/2020 12:30 PM Medical Record Number: [id number] Patient Account Number: [id number] Date of Birth/Sex: Treating RN: 02-17-1951 (70 y.o. [name] Primary Care Provider: Aura Dials Other Clinician: Referring Provider: Treating Provider/Extender: Cassell Clement in Treatment: 15 Information Obtained from: Patient Chief Complaint Wound exam; patient is here for review of his skin condition in his coccyx area and surrounding buttock Electronic Signature(s) Signed: 06/11/2020 12:46:46 PM By: Worthy Keeler PA-C Entered By: Worthy Keeler on 06/11/2020 12:46:46 -------------------------------------------------------------------------------- Debridement Details Patient Name: Date of Service: Alan Wang, Alan Pray D. 06/11/2020 12:30 PM Medical Record Number: [id number] Patient Account Number: [id number] Date of Birth/Sex: Treating RN: 1950-10-21 (70 y.o. [name] Primary Care Provider: Aura Dials Other Clinician: Referring Provider: Treating Provider/Extender: Cassell Clement in Treatment: 15 Debridement Performed for Assessment: Wound #2 Right Gluteus Performed By: Clinician Baruch Gouty, RN Debridement Type: Chemical/Enzymatic/Mechanical Agent Used: gauze Level of Consciousness (Pre-procedure): Awake and Alert Pre-procedure Verification/Time Out No Taken: Bleeding: None Response to Treatment: Procedure was tolerated well Level of Consciousness (Post- Awake and Alert procedure): Post Debridement Measurements of Total Wound Length: (cm) 1.5 Stage: Category/Stage II Width: (cm) 0.3 Depth: (cm) 0.1 Volume: (cm) 0.035 Character of Wound/Ulcer Post Debridement: Improved Post Procedure Diagnosis Same as Pre-procedure Electronic Signature(s) Signed: 06/11/2020 5:46:22 PM By:  Worthy Keeler PA-C Signed: 06/11/2020 5:59:57 PM By: Baruch Gouty RN, BSN Entered By: Baruch Gouty on 06/11/2020 13:40:37 -------------------------------------------------------------------------------- HPI Details Patient Name: Date of Service: Alan Wang, Alan Pray D. 06/11/2020 12:30 PM Medical Record Number: [id number] Patient Account Number: [id number] Date of Birth/Sex: Treating RN: 1951/02/07 (70 y.o. [name] Primary Care Provider: Aura Dials Other Clinician: Referring Provider: Treating Provider/Extender: Cassell Clement in Treatment: 15 History of Present Illness HPI Description: ADMISSION 02/21/2020 This is a 70 year old man with type 2 diabetes well controlled. For the last 3 months he has had a difficult painful/burning area in his coccyx extending between the gluteal cleft. He went to see his primary doctor who probably prescribed nystatin powder although they never managed to pick this up. They have been using something called [place] salve and paradoxically the areas gotten a lot better. There are small open areas but apparently one point these were quite a bit larger. Past medical history includes coronary artery disease, type 2 diabetes,o Rheumatoid arthritis, history of skin cancer in his left upper arm, asthma, CVA 12/23 he arrives in clinic not receiving any supplies from [place]. This is apparently tangled up and [employer] ex. I also ordered ketoconazole for him for the remaining intertrigo however he does not seem to have gotten this either. Readmission: 04/16/2020 upon evaluation today patient appears for reevaluation here in the clinic although he was last seen on December 16 by Dr. Dellia Nims. At that time it was thought he may have a fungal type infection he did actually have ketoconazole cream prescribed to him. He did get this cream and has been using it according to the patient and his wife. Unfortunately though is not having as much  drainage as he did he does still have a rash that is very irritating around the sacral and perianal region. There does not appear to be any signs of active infection at this time which is great news. 05/14/2020 upon evaluation today patient appears to be doing well with  regard to his wound currently. He has been tolerating the dressing changes without complication. Fortunately I think that he is better than last time I saw him. I feel that the nystatin powder is helping. With that being said I do believe that he is going require some slight changes in his treatment regimen to try to get things under better control here. 05/28/2020 on evaluation today patient appears to be doing well with regard to the midline sacral region. There does not appear to be any evidence of infection which is great news. In fact this appears to be closed but he has a wound over on the side more to the right that is open currently. Fortunately there is no evidence of active infection at this time which is great news. I do not believe he needs the nystatin powder any longer. 06/11/2020 on evaluation today patient's wounds actually are showing signs of good granulation epithelization on the left side now the right side is open. His wife tells me yesterday the right side was not open so this is a change even since that time. Fortunately there is no signs of active infection at this time. No fever chills noted Electronic Signature(s) Signed: 06/11/2020 1:39:36 PM By: Worthy Keeler PA-C Entered By: Worthy Keeler on 06/11/2020 13:39:35 -------------------------------------------------------------------------------- Physical Exam Details Patient Name: Date of Service: Alan Morton D. 06/11/2020 12:30 PM Medical Record Number: [id number] Patient Account Number: [id number] Date of Birth/Sex: Treating RN: [birth year] (70 y.o. [name] Primary Care Provider: Aura Dials Other Clinician: Referring Provider: Treating  Provider/Extender: Cassell Clement in Treatment: 74 Constitutional Well-nourished and well-hydrated in no acute distress. Respiratory normal breathing without difficulty. Psychiatric this patient is able to make decisions and demonstrates good insight into disease process. Alert and Oriented x 3. pleasant and cooperative. Notes Upon inspection patient's wound bed actually showed signs of good granulation epithelization at this point. There does not appear to be any evidence of infection which is great news and overall very pleased with where things stand today. This is on the left. In regard to the right unfortunately has a minimal and slight breakdown but breakdown nonetheless on the right side. He does appear that he still not really moving around and sitting for long periods of time which he really should not be doing. Electronic Signature(s) Signed: 06/11/2020 1:40:08 PM By: Worthy Keeler PA-C Entered By: Worthy Keeler on 06/11/2020 13:40:08 -------------------------------------------------------------------------------- Physician Orders Details Patient Name: Date of Service: Alan Wang, Alan Pray D. 06/11/2020 12:30 PM Medical Record Number: [id number] Patient Account Number: [id number] Date of Birth/Sex: Treating RN: [birth year] (70 y.o. [name] Primary Care Provider: Aura Dials Other Clinician: Referring Provider: Treating Provider/Extender: Cassell Clement in Treatment: 15 Verbal / Phone Orders: No Diagnosis Coding ICD-10 Coding Code Description B35.6 Tinea cruris L98.498 Non-pressure chronic ulcer of skin of other sites with other specified severity Follow-up Appointments Return Appointment in 2 weeks. Bathing/ Shower/ Hygiene May shower and wash wound with soap and water. Off-Loading Turn and reposition every 2 hours - while laying down. ensure while sitting to adjust/move to offload pressure to affected area. Stand  at least once every hour while awake. Wound Treatment Wound #1 - Coccyx Cleanser: Soap and Water Every Other Day/30 Days Discharge Instructions: May shower and wash wound with dial antibacterial soap and water prior to dressing change. Prim Dressing: KerraCel Ag Gelling Fiber Dressing, 2x2 in (silver alginate) (Dispense As Written) Every  Other Day/30 Days ary Discharge Instructions: Apply silver alginate to wound bed as instructed Secondary Dressing: ComfortFoam Border, 3x3 in (silicone border) (Generic) Every Other Day/30 Days Discharge Instructions: Apply over primary dressing as directed. Wound #2 - Gluteus Wound Laterality: Right Cleanser: Soap and Water Every Other Day/30 Days Discharge Instructions: May shower and wash wound with dial antibacterial soap and water prior to dressing change. Prim Dressing: KerraCel Ag Gelling Fiber Dressing, 2x2 in (silver alginate) (DME) (Dispense As Written) Every Other Day/30 Days ary Discharge Instructions: Apply silver alginate to wound bed as instructed Secondary Dressing: ComfortFoam Border, 4x4 in (silicone border) (DME) (Generic) Every Other Day/30 Days Discharge Instructions: Apply over primary dressing as directed. Electronic Signature(s) Signed: 06/11/2020 5:46:22 PM By: Worthy Keeler PA-C Signed: 06/11/2020 5:59:57 PM By: Baruch Gouty RN, BSN Entered By: Baruch Gouty on 06/11/2020 13:59:13 -------------------------------------------------------------------------------- Problem List Details Patient Name: Date of Service: Alan Wang, Alan Pray D. 06/11/2020 12:30 PM Medical Record Number: [id number] Patient Account Number: [id number] Date of Birth/Sex: Treating RN: 12-18-1950 (70 y.o. [name] Primary Care Provider: Aura Dials Other Clinician: Referring Provider: Treating Provider/Extender: Cassell Clement in Treatment: 15 Active Problems ICD-10 Encounter Code Description Active Date  MDM Diagnosis B35.6 Tinea cruris 02/21/2020 No Yes L98.498 Non-pressure chronic ulcer of skin of other sites with other specified severity 02/21/2020 No Yes Inactive Problems Resolved Problems Electronic Signature(s) Signed: 06/11/2020 12:46:40 PM By: Worthy Keeler PA-C Entered By: Worthy Keeler on 06/11/2020 12:46:40 -------------------------------------------------------------------------------- Progress Note Details Patient Name: Date of Service: Alan Morton D. 06/11/2020 12:30 PM Medical Record Number: [id number] Patient Account Number: [id number] Date of Birth/Sex: Treating RN: 1950/07/30 (70 y.o. [name] Primary Care Provider: Aura Dials Other Clinician: Referring Provider: Treating Provider/Extender: Cassell Clement in Treatment: 15 Subjective Chief Complaint Information obtained from Patient Wound exam; patient is here for review of his skin condition in his coccyx area and surrounding buttock History of Present Illness (HPI) ADMISSION 02/21/2020 This is a 70 year old man with type 2 diabetes well controlled. For the last 3 months he has had a difficult painful/burning area in his coccyx extending between the gluteal cleft. He went to see his primary doctor who probably prescribed nystatin powder although they never managed to pick this up. They have been using something called [place] salve and paradoxically the areas gotten a lot better. There are small open areas but apparently one point these were quite a bit larger. Past medical history includes coronary artery disease, type 2 diabetes,o Rheumatoid arthritis, history of skin cancer in his left upper arm, asthma, CVA 12/23 he arrives in clinic not receiving any supplies from [place]. This is apparently tangled up and [employer] ex. I also ordered ketoconazole for him for the remaining intertrigo however he does not seem to have gotten this either. Readmission: 04/16/2020 upon evaluation  today patient appears for reevaluation here in the clinic although he was last seen on December 16 by Dr. Dellia Nims. At that time it was thought he may have a fungal type infection he did actually have ketoconazole cream prescribed to him. He did get this cream and has been using it according to the patient and his wife. Unfortunately though is not having as much drainage as he did he does still have a rash that is very irritating around the sacral and perianal region. There does not appear to be any signs of active infection at this time which is great news. 05/14/2020  upon evaluation today patient appears to be doing well with regard to his wound currently. He has been tolerating the dressing changes without complication. Fortunately I think that he is better than last time I saw him. I feel that the nystatin powder is helping. With that being said I do believe that he is going require some slight changes in his treatment regimen to try to get things under better control here. 05/28/2020 on evaluation today patient appears to be doing well with regard to the midline sacral region. There does not appear to be any evidence of infection which is great news. In fact this appears to be closed but he has a wound over on the side more to the right that is open currently. Fortunately there is no evidence of active infection at this time which is great news. I do not believe he needs the nystatin powder any longer. 06/11/2020 on evaluation today patient's wounds actually are showing signs of good granulation epithelization on the left side now the right side is open. His wife tells me yesterday the right side was not open so this is a change even since that time. Fortunately there is no signs of active infection at this time. No fever chills noted Objective Constitutional Well-nourished and well-hydrated in no acute distress. Vitals Time Taken: 12:44 PM, Height: 70 in, Weight: 204 lbs, BMI: 29.3, Temperature: 98.9  F, Pulse: 79 bpm, Respiratory Rate: 16 breaths/min, Blood Pressure: 100/70 mmHg, Capillary Blood Glucose: 125 mg/dl. General Notes: glucose per pt report Respiratory normal breathing without difficulty. Psychiatric this patient is able to make decisions and demonstrates good insight into disease process. Alert and Oriented x 3. pleasant and cooperative. General Notes: Upon inspection patient's wound bed actually showed signs of good granulation epithelization at this point. There does not appear to be any evidence of infection which is great news and overall very pleased with where things stand today. This is on the left. In regard to the right unfortunately has a minimal and slight breakdown but breakdown nonetheless on the right side. He does appear that he still not really moving around and sitting for long periods of time which he really should not be doing. Integumentary (Hair, Skin) Wound #1 status is Open. Original cause of wound was Gradually Appeared. The date acquired was: 12/10/2019. The wound has been in treatment 15 weeks. The wound is located on the Coccyx. The wound measures 0.1cm length x 0.1cm width x 0.1cm depth; 0.008cm^2 area and 0.001cm^3 volume. There is no tunneling or undermining noted. There is a small amount of serous drainage noted. The wound margin is indistinct and nonvisible. There is large (67-100%) pink granulation within the wound bed. There is no necrotic tissue within the wound bed. Wound #2 status is Open. Original cause of wound was Gradually Appeared. The date acquired was: 06/06/2020. The wound is located on the Right Gluteus. The wound measures 1.5cm length x 0.3cm width x 0.1cm depth; 0.353cm^2 area and 0.035cm^3 volume. There is Fat Layer (Subcutaneous Tissue) exposed. There is no tunneling or undermining noted. There is a medium amount of serosanguineous drainage noted. The wound margin is flat and intact. There is large (67-100%) pink granulation within  the wound bed. There is no necrotic tissue within the wound bed. Assessment Active Problems ICD-10 Tinea cruris Non-pressure chronic ulcer of skin of other sites with other specified severity Procedures Wound #2 Pre-procedure diagnosis of Wound #2 is a Pressure Ulcer located on the Right Gluteus . There was a  Chemical/Enzymatic/Mechanical debridement performed by Baruch Gouty, RN.. Other agent used was gauze. There was no bleeding. The procedure was tolerated well. Post Debridement Measurements: 1.5cm length x 0.3cm width x 0.1cm depth; 0.035cm^3 volume. Post debridement Stage noted as Category/Stage II. Character of Wound/Ulcer Post Debridement is improved. Post procedure Diagnosis Wound #2: Same as Pre-Procedure Plan Follow-up Appointments: Return Appointment in 2 weeks. Bathing/ Shower/ Hygiene: May shower and wash wound with soap and water. Off-Loading: Turn and reposition every 2 hours - while laying down. ensure while sitting to adjust/move to offload pressure to affected area. Stand at least once every hour while awake. WOUND #1: - Coccyx Wound Laterality: Cleanser: Soap and Water Every Other Day/30 Days Discharge Instructions: May shower and wash wound with dial antibacterial soap and water prior to dressing change. Prim Dressing: KerraCel Ag Gelling Fiber Dressing, 2x2 in (silver alginate) (Dispense As Written) Every Other Day/30 Days ary Discharge Instructions: Apply silver alginate to wound bed as instructed Secondary Dressing: ComfortFoam Border, 3x3 in (silicone border) (Generic) Every Other Day/30 Days Discharge Instructions: Apply over primary dressing as directed. WOUND #2: - Gluteus Wound Laterality: Right Cleanser: Soap and Water Every Other Day/30 Days Discharge Instructions: May shower and wash wound with dial antibacterial soap and water prior to dressing change. Prim Dressing: KerraCel Ag Gelling Fiber Dressing, 2x2 in (silver alginate) (DME) (Dispense As  Written) Every Other Day/30 Days ary Discharge Instructions: Apply silver alginate to wound bed as instructed Secondary Dressing: ComfortFoam Border, 4x4 in (silicone border) (DME) (Generic) Every Other Day/30 Days Discharge Instructions: Apply over primary dressing as directed. 1. Would recommend currently that he needs to definitely get up and move around at least 5 minutes every hour in order to let the blood flow and get things moving so that he hopefully does not have any breakdown of the tissue. I think this may be the culprit here. In the meantime we can continue with the silver alginate. 2. I am also can recommend that we continue with the border foam dressing. 3. I did advise as well if he can lay in bed that is also a good way to help this feel better. We will see patient back for reevaluation in 1 week here in the clinic. If anything worsens or changes patient will contact our office for additional recommendations. Electronic Signature(s) Signed: 06/11/2020 5:46:22 PM By: Worthy Keeler PA-C Signed: 06/11/2020 5:59:57 PM By: Baruch Gouty RN, BSN Previous Signature: 06/11/2020 1:40:37 PM Version By: Worthy Keeler PA-C Entered By: Baruch Gouty on 06/11/2020 13:59:24 -------------------------------------------------------------------------------- SuperBill Details Patient Name: Date of Service: Alan Wang, Alan RY D. 06/11/2020 Medical Record Number: [id number] Patient Account Number: [id number] Date of Birth/Sex: Treating RN: Sep 15, 1950 (71 y.o. [name] Primary Care Provider: Aura Dials Other Clinician: Referring Provider: Treating Provider/Extender: Cassell Clement in Treatment: 15 Diagnosis Coding ICD-10 Codes Code Description B35.6 Tinea cruris L98.498 Non-pressure chronic ulcer of skin of other sites with other specified severity Facility Procedures CPT4 Code: [id number] Description: 99213 - WOUND CARE VISIT-LEV 3 EST  PT Modifier: Quantity: 1 Physician Procedures : CPT4 Code Description Modifier [id number] - WC PHYS LEVEL 3 - EST PT ICD-10 Diagnosis Description B35.6 Tinea cruris L98.498 Non-pressure chronic ulcer of skin of other sites with other specified severity Quantity: 1 Electronic Signature(s) Signed: 06/11/2020 1:40:48 PM By: Worthy Keeler PA-C Entered By: Worthy Keeler on 06/11/2020 13:40:47

## 2020-06-12 NOTE — Progress Notes (Signed)
[name] ([id number]) , Visit Report for 06/11/2020 Arrival Information Details Patient Name: Date of Service: Alan Wang D. 06/11/2020 12:30 PM Medical Record Number: [id number] Patient Account Number: [id number] Date of Birth/Sex: Treating RN: 1950/12/13 (70 y.o. Alan Wang Primary Care Jaqualyn Juday: Aura Dials Other Clinician: Referring Estiben Mizuno: Treating Dennis Killilea/Extender: Cassell Clement in Treatment: 15 Visit Information History Since Last Visit Added or deleted any medications: No Patient Arrived: Ambulatory Any new allergies or adverse reactions: No Arrival Time: 12:44 Had a fall or experienced change in No Accompanied By: wife activities of daily living that may affect Transfer Assistance: None risk of falls: Patient Identification Verified: Yes Signs or symptoms of abuse/neglect since last visito No Secondary Verification Process Completed: Yes Hospitalized since last visit: No Patient Requires Transmission-Based Precautions: No Implantable device outside of the clinic excluding No Patient Has Alerts: No cellular tissue based products placed in the center since last visit: Has Dressing in Place as Prescribed: Yes Pain Present Now: Yes Electronic Signature(s) Signed: 06/11/2020 5:44:43 PM By: Alan Hurst RN, BSN Entered By: Alan Wang on 06/11/2020 12:44:28 -------------------------------------------------------------------------------- Clinic Level of Care Assessment Details Patient Name: Date of Service: Alan Wang, Alan Pray D. 06/11/2020 12:30 PM Medical Record Number: [id number] Patient Account Number: [id number] Date of Birth/Sex: Treating RN: [birth year] (70 y.o. Alan Wang Primary Care Alan Wang: Aura Dials Other Clinician: Referring Deloy Archey: Treating Alan Wang/Extender: Cassell Clement in Treatment: 15 Clinic Level of Care Assessment Items TOOL 4 Quantity Score []  - 0 Use when only an  EandM is performed on FOLLOW-UP visit ASSESSMENTS - Nursing Assessment / Reassessment X- 1 10 Reassessment of Co-morbidities (includes updates in patient status) X- 1 5 Reassessment of Adherence to Treatment Plan ASSESSMENTS - Wound and Skin A ssessment / Reassessment []  - 0 Simple Wound Assessment / Reassessment - one wound X- 2 5 Complex Wound Assessment / Reassessment - multiple wounds []  - 0 Dermatologic / Skin Assessment (not related to wound area) ASSESSMENTS - Focused Assessment []  - 0 Circumferential Edema Measurements - multi extremities []  - 0 Nutritional Assessment / Counseling / Intervention []  - 0 Lower Extremity Assessment (monofilament, tuning fork, pulses) []  - 0 Peripheral Arterial Disease Assessment (using hand held doppler) ASSESSMENTS - Ostomy and/or Continence Assessment and Care []  - 0 Incontinence Assessment and Management []  - 0 Ostomy Care Assessment and Management (repouching, etc.) PROCESS - Coordination of Care X - Simple Patient / Family Education for ongoing care 1 15 []  - 0 Complex (extensive) Patient / Family Education for ongoing care X- 1 10 Staff obtains Programmer, systems, Records, T Results / Process Orders est []  - 0 Staff telephones HHA, Nursing Homes / Clarify orders / etc []  - 0 Routine Transfer to another Facility (non-emergent condition) []  - 0 Routine Hospital Admission (non-emergent condition) []  - 0 New Admissions / Biomedical engineer / Ordering NPWT Apligraf, etc. , []  - 0 Emergency Hospital Admission (emergent condition) X- 1 10 Simple Discharge Coordination []  - 0 Complex (extensive) Discharge Coordination PROCESS - Special Needs []  - 0 Pediatric / Minor Patient Management []  - 0 Isolation Patient Management []  - 0 Hearing / Language / Visual special needs []  - 0 Assessment of Community assistance (transportation, D/C planning, etc.) []  - 0 Additional assistance / Altered mentation []  - 0 Support Surface(s)  Assessment (bed, cushion, seat, etc.) INTERVENTIONS - Wound Cleansing / Measurement []  - 0 Simple Wound Cleansing - one wound X- 2 5 Complex Wound Cleansing -  multiple wounds X- 1 5 Wound Imaging (photographs - any number of wounds) []  - 0 Wound Tracing (instead of photographs) []  - 0 Simple Wound Measurement - one wound X- 2 5 Complex Wound Measurement - multiple wounds INTERVENTIONS - Wound Dressings X - Small Wound Dressing one or multiple wounds 2 10 []  - 0 Medium Wound Dressing one or multiple wounds []  - 0 Large Wound Dressing one or multiple wounds X- 1 5 Application of Medications - topical []  - 0 Application of Medications - injection INTERVENTIONS - Miscellaneous []  - 0 External ear exam []  - 0 Specimen Collection (cultures, biopsies, blood, body fluids, etc.) []  - 0 Specimen(s) / Culture(s) sent or taken to Lab for analysis []  - 0 Patient Transfer (multiple staff / Civil Service fast streamer / Similar devices) []  - 0 Simple Staple / Suture removal (25 or less) []  - 0 Complex Staple / Suture removal (26 or more) []  - 0 Hypo / Hyperglycemic Management (close monitor of Blood Glucose) []  - 0 Ankle / Brachial Index (ABI) - do not check if billed separately X- 1 5 Vital Signs Has the patient been seen at the hospital within the last three years: Yes Total Score: 115 Level Of Care: New/Established - Level 3 Electronic Signature(s) Signed: 06/11/2020 5:59:57 PM By: Alan Gouty RN, BSN Entered By: Alan Wang on 06/11/2020 13:34:03 -------------------------------------------------------------------------------- Encounter Discharge Information Details Patient Name: Date of Service: Alan Wang, Alan Pray D. 06/11/2020 12:30 PM Medical Record Number: [id number] Patient Account Number: [id number] Date of Birth/Sex: Treating RN: Aug 10, 1950 (70 y.o. [name] Primary Care Alan Wang: Aura Dials Other Clinician: Referring Alan Wang: Treating Alan Wang/Extender: Cassell Clement in Treatment: 15 Encounter Discharge Information Items Post Procedure Vitals Discharge Condition: Stable Temperature (F): 97.4 Ambulatory Status: Ambulatory Pulse (bpm): 74 Discharge Destination: Home Respiratory Rate (breaths/min): 17 Transportation: Private Auto Blood Pressure (mmHg): 134/74 Accompanied By: wife Schedule Follow-up Appointment: Yes Clinical Summary of Care: Patient Declined Electronic Signature(s) Signed: 06/11/2020 5:28:50 PM By: Rhae Hammock RN Entered By: Rhae Hammock on 06/11/2020 13:42:51 -------------------------------------------------------------------------------- Dolores Details Patient Name: Date of Service: Alan Wang, Alan Pray D. 06/11/2020 12:30 PM Medical Record Number: [id number] Patient Account Number: [id number] Date of Birth/Sex: Treating RN: Aug 28, 1950 (70 y.o. Alan Wang Primary Care Bennett Vanscyoc: Aura Dials Other Clinician: Referring Ledarius Leeson: Treating Artemio Dobie/Extender: Cassell Clement in Treatment: [place] reviewed with physician Active Inactive Abuse / Safety / Falls / Self Care Management Nursing Diagnoses: Potential for falls Goals: Patient will not experience any injury related to falls Date Initiated: 02/21/2020 Target Resolution Date: 07/09/2020 Goal Status: Active Patient/caregiver will verbalize/demonstrate measure taken to improve self care Date Initiated: 02/21/2020 Date Inactivated: 04/16/2020 Target Resolution Date: 03/14/2020 Goal Status: Met Interventions: Provide education on basic hygiene Provide education on fall prevention Notes: Wound/Skin Impairment Nursing Diagnoses: Knowledge deficit related to ulceration/compromised skin integrity Goals: Patient/caregiver will verbalize understanding of skin care regimen Date Initiated: 02/21/2020 Target Resolution Date: 07/09/2020 Goal Status:  Active Interventions: Assess patient/caregiver ability to obtain necessary supplies Assess patient/caregiver ability to perform ulcer/skin care regimen upon admission and as needed Provide education on ulcer and skin care Treatment Activities: Skin care regimen initiated : 02/21/2020 Topical wound management initiated : 02/21/2020 Notes: Electronic Signature(s) Signed: 06/11/2020 5:59:57 PM By: Alan Gouty RN, BSN Entered By: Alan Wang on 06/11/2020 13:22:20 -------------------------------------------------------------------------------- Pain Assessment Details Patient Name: Date of Service: Alan Wang D. 06/11/2020 12:30 PM Medical Record Number: [id number] Patient  Account Number: [id number] Date of Birth/Sex: Treating RN: [birth year] (70 y.o. Alan Wang Primary Care Nyella Eckels: Aura Dials Other Clinician: Referring Yanixan Mellinger: Treating Chana Lindstrom/Extender: Cassell Clement in Treatment: 15 Active Problems Location of Pain Severity and Description of Pain Patient Has Paino Yes Site Locations With Dressing Change: Yes Duration of the Pain. Constant / Intermittento Intermittent Rate the pain. Current Pain Level: 4 Character of Pain Describe the Pain: Burning Pain Management and Medication Current Pain Management: Medication: Yes Cold Application: No Rest: No Massage: No Activity: No T.E.N.S.: No Heat Application: No Leg drop or elevation: No Is the Current Pain Management Adequate: Adequate How does your wound impact your activities of daily livingo Sleep: No Bathing: No Appetite: No Relationship With Others: No Bladder Continence: No Emotions: No Bowel Continence: No Work: No Toileting: No Drive: No Dressing: No Hobbies: No Electronic Signature(s) Signed: 06/11/2020 5:44:43 PM By: Alan Hurst RN, BSN Entered By: Alan Wang on 06/11/2020  12:48:10 -------------------------------------------------------------------------------- Patient/Caregiver Education Details Patient Name: Date of Service: Alan Wang D. 4/6/2022andnbsp12:30 PM Medical Record Number: [id number] Patient Account Number: [id number] Date of Birth/Gender: Treating RN: 10/12/1950 (69 y.o. Alan Wang Primary Care Physician: Aura Dials Other Clinician: Referring Physician: Treating Physician/Extender: Cassell Clement in Treatment: 15 Education Assessment Education Provided To: Patient Education Topics Provided Wound/Skin Impairment: Methods: Explain/Verbal Responses: Reinforcements needed, State content correctly [employer]) Signed: 06/11/2020 5:59:57 PM By: Alan Gouty RN, BSN Entered By: Alan Wang on 06/11/2020 13:22:39 -------------------------------------------------------------------------------- Wound Assessment Details Patient Name: Date of Service: Alan Wang D. 06/11/2020 12:30 PM Medical Record Number: [id number] Patient Account Number: [id number] Date of Birth/Sex: Treating RN: [birth year] (70 y.o. Alan Wang Primary Care Irasema Chalk: Aura Dials Other Clinician: Referring Tyann Niehaus: Treating Augustus Zurawski/Extender: Cassell Clement in Treatment: 15 Wound Status Wound Number: 1 Primary Inflammatory Etiology: Etiology: Wound Location: Coccyx Wound Status: Open Wounding Event: Gradually Appeared Comorbid Asthma, Coronary Artery Disease, Hypertension, Type II Date Acquired: 12/10/2019 History: Diabetes Weeks Of Treatment: 15 Clustered Wound: No Photos Wound Measurements Length: (cm) 0.1 Width: (cm) 0.1 Depth: (cm) 0.1 Area: (cm) 0.008 Volume: (cm) 0.001 % Reduction in Area: 99.9% % Reduction in Volume: 99.9% Epithelialization: Large (67-100%) Tunneling: No Undermining: No Wound Description Classification: Full Thickness Without Exposed  Support Structures Wound Margin: Indistinct, nonvisible Exudate Amount: Small Exudate Type: Serous Exudate Color: amber Foul Odor After Cleansing: No Slough/Fibrino No Wound Bed Granulation Amount: Large (67-100%) Exposed Structure Granulation Quality: Pink Fascia Exposed: No Necrotic Amount: None Present (0%) Fat Layer (Subcutaneous Tissue) Exposed: No Tendon Exposed: No Muscle Exposed: No Joint Exposed: No Bone Exposed: No Treatment Notes Wound #1 (Coccyx) Cleanser Soap and Water Discharge Instruction: May shower and wash wound with dial antibacterial soap and water prior to dressing change. Peri-Wound Care Topical Primary Dressing KerraCel Ag Gelling Fiber Dressing, 2x2 in (silver alginate) Discharge Instruction: Apply silver alginate to wound bed as instructed Secondary Dressing ComfortFoam Border, 3x3 in (silicone border) Discharge Instruction: Apply over primary dressing as directed. Secured With Compression Wrap Compression Stockings Environmental education officer) Signed: 06/11/2020 5:44:43 PM By: Alan Hurst RN, BSN Signed: 06/12/2020 4:58:23 PM By: Sandre Kitty Entered By: Sandre Kitty on 06/11/2020 16:35:00 -------------------------------------------------------------------------------- Wound Assessment Details Patient Name: Date of Service: Alan Wang, Alan Pray D. 06/11/2020 12:30 PM Medical Record Number: [id number] Patient Account Number: [id number] Date of Birth/Sex: Treating RN: 27-Mar-1950 (70 y.o. Alan Wang Primary Care Lulu Hirschmann: Aura Dials Other Clinician: Referring Wynne Rozak:  Treating Rabia Argote/Extender: Cassell Clement in Treatment: 15 Wound Status Wound Number: 2 Primary Pressure Ulcer Etiology: Wound Location: Right Gluteus Wound Status: Open Wounding Event: Gradually Appeared Comorbid Asthma, Coronary Artery Disease, Hypertension, Type II Date Acquired: 06/06/2020 History: Diabetes Weeks Of Treatment:  0 Clustered Wound: No Photos Wound Measurements Length: (cm) 1.5 Width: (cm) 0.3 Depth: (cm) 0.1 Area: (cm) 0.353 Volume: (cm) 0.035 % Reduction in Area: 0% % Reduction in Volume: 0% Epithelialization: None Tunneling: No Undermining: No Wound Description Classification: Category/Stage II Wound Margin: Flat and Intact Exudate Amount: Medium Exudate Type: Serosanguineous Exudate Color: red, brown Wound Bed Granulation Amount: Large (67-100%) Exposed Structure Granulation Quality: Pink Fascia Exposed: No Necrotic Amount: None Present (0%) Fat Layer (Subcutaneous Tissue) Exposed: Yes Tendon Exposed: No Muscle Exposed: No Joint Exposed: No Bone Exposed: No Treatment Notes Wound #2 (Gluteus) Wound Laterality: Right Cleanser Soap and Water Discharge Instruction: May shower and wash wound with dial antibacterial soap and water prior to dressing change. Peri-Wound Care Topical Primary Dressing KerraCel Ag Gelling Fiber Dressing, 2x2 in (silver alginate) Discharge Instruction: Apply silver alginate to wound bed as instructed Secondary Dressing ComfortFoam Border, 3x3 in (silicone border) Discharge Instruction: Apply over primary dressing as directed. Secured With Compression Wrap Compression Stockings Environmental education officer) Signed: 06/11/2020 5:44:43 PM By: Alan Hurst RN, BSN Signed: 06/12/2020 4:58:23 PM By: Sandre Kitty Entered By: Sandre Kitty on 06/11/2020 16:35:31 -------------------------------------------------------------------------------- Vitals Details Patient Name: Date of Service: Alan Wang, [place] RY D. 06/11/2020 12:30 PM Medical Record Number: [id number] Patient Account Number: [id number] Date of Birth/Sex: Treating RN: [birth year] (70 y.o. Alan Wang Primary Care Quintina Hakeem: Aura Dials Other Clinician: Referring Chatham Howington: Treating Idy Rawling/Extender: Cassell Clement in Treatment: 15 Vital Signs Time Taken:  12:44 Temperature (F): 98.9 Height (in): 70 Pulse (bpm): 79 Weight (lbs): 204 Respiratory Rate (breaths/min): 16 Body Mass Index (BMI): 29.3 Blood Pressure (mmHg): 100/70 Capillary Blood Glucose (mg/dl): 125 Reference Range: 80 - 120 mg / dl Notes glucose per pt report Electronic Signature(s) Signed: 06/11/2020 5:44:43 PM By: Alan Hurst RN, BSN Entered By: Alan Wang on 06/11/2020 12:47:34

## 2020-06-17 DIAGNOSIS — Z23 Encounter for immunization: Secondary | ICD-10-CM | POA: Diagnosis not present

## 2020-06-18 ENCOUNTER — Telehealth: Payer: Self-pay

## 2020-06-18 NOTE — Telephone Encounter (Signed)
Severe total body aches x 1 week off/on, COVID booster 06/17/2020, severe pain now, no fever, taking tramadol and Tylenol, not helping.

## 2020-06-18 NOTE — Telephone Encounter (Signed)
Patient's wife Alan Wang left a voicemail stating [name] is having a lot of "body pain" and requesting to speak with Dr. Estanislado Pandy about it.

## 2020-06-18 NOTE — Telephone Encounter (Signed)
Please notify the patient and his wife that Dr. Estanislado Pandy is not in the office this week.  He can schedule an appt next week if he would like to be evaluated.  Ok to also call PCP if the body aches persist or worsen.

## 2020-06-18 NOTE — Telephone Encounter (Signed)
I called patient's wife, Vaughan Basta, who verbalized understanding.

## 2020-06-24 ENCOUNTER — Other Ambulatory Visit: Payer: Self-pay | Admitting: Rheumatology

## 2020-06-24 NOTE — Telephone Encounter (Signed)
Next Visit: 10/21/2020  Last Visit: 04/23/2020  Last Fill: 04/01/2020  DX: Inflammatory arthritis  Current Dose per office note 04/23/2020,  Sulfasalazine 1000 mg twice daily Labs: 04/01/2020,  Glucose is elevated-130. Rest of CMP WNL. CBC WNL.   Okay to refill SSZ?

## 2020-06-25 ENCOUNTER — Encounter (HOSPITAL_BASED_OUTPATIENT_CLINIC_OR_DEPARTMENT_OTHER): Payer: Medicare HMO | Admitting: Physician Assistant

## 2020-07-02 ENCOUNTER — Other Ambulatory Visit: Payer: Self-pay

## 2020-07-02 ENCOUNTER — Encounter (HOSPITAL_BASED_OUTPATIENT_CLINIC_OR_DEPARTMENT_OTHER): Payer: Medicare HMO | Admitting: Internal Medicine

## 2020-07-02 DIAGNOSIS — L98499 Non-pressure chronic ulcer of skin of other sites with unspecified severity: Secondary | ICD-10-CM | POA: Diagnosis not present

## 2020-07-02 DIAGNOSIS — E11622 Type 2 diabetes mellitus with other skin ulcer: Secondary | ICD-10-CM | POA: Diagnosis not present

## 2020-07-02 DIAGNOSIS — L98498 Non-pressure chronic ulcer of skin of other sites with other specified severity: Secondary | ICD-10-CM | POA: Diagnosis not present

## 2020-07-02 DIAGNOSIS — B356 Tinea cruris: Secondary | ICD-10-CM | POA: Diagnosis not present

## 2020-07-03 DIAGNOSIS — S91309D Unspecified open wound, unspecified foot, subsequent encounter: Secondary | ICD-10-CM | POA: Diagnosis not present

## 2020-07-07 NOTE — Progress Notes (Addendum)
Alan Wang ([id number]) , Visit Report for 07/02/2020 Arrival Information Details Patient Name: Date of Service: Alan Morton D. 07/02/2020 12:30 PM Medical Record Number: [id number] Patient Account Number: [id number] Date of Birth/Sex: Treating RN: March 21, 1950 (70 y.o. Janyth Contes Primary Care Corazon Nickolas: Aura Dials Other Clinician: Referring Sereena Marando: Treating Jaydon Soroka/Extender: Shawna Clamp in Treatment: 18 Visit Information History Since Last Visit Added or deleted any medications: No Patient Arrived: Ambulatory Any new allergies or adverse reactions: No Arrival Time: 12:41 Had a fall or experienced change in No Accompanied By: wife activities of daily living that may affect Transfer Assistance: None risk of falls: Patient Identification Verified: Yes Signs or symptoms of abuse/neglect since last visito No Secondary Verification Process Completed: Yes Hospitalized since last visit: No Patient Requires Transmission-Based Precautions: No Implantable device outside of the clinic excluding No Patient Has Alerts: No cellular tissue based products placed in the center since last visit: Has Dressing in Place as Prescribed: Yes Pain Present Now: No Electronic Signature(s) Signed: 07/07/2020 5:39:21 PM By: Levan Hurst RN, BSN Entered By: Levan Hurst on 07/02/2020 12:41:52 -------------------------------------------------------------------------------- Clinic Level of Care Assessment Details Patient Name: Date of Service: Alan Morton D. 07/02/2020 12:30 PM Medical Record Number: [id number] Patient Account Number: [id number] Date of Birth/Sex: Treating RN: 1950/04/25 (70 y.o. [name] Primary Care Zavia Pullen: Aura Dials Other Clinician: Referring Colin Ellers: Treating Jaelynn Currier/Extender: Shawna Clamp in Treatment: 18 Clinic Level of Care Assessment Items TOOL 4 Quantity Score []  - 0 Use when only  an EandM is performed on FOLLOW-UP visit ASSESSMENTS - Nursing Assessment / Reassessment X- 1 10 Reassessment of Co-morbidities (includes updates in patient status) X- 1 5 Reassessment of Adherence to Treatment Plan ASSESSMENTS - Wound and Skin A ssessment / Reassessment X - Simple Wound Assessment / Reassessment - one wound 1 5 []  - 0 Complex Wound Assessment / Reassessment - multiple wounds []  - 0 Dermatologic / Skin Assessment (not related to wound area) ASSESSMENTS - Focused Assessment []  - 0 Circumferential Edema Measurements - multi extremities []  - 0 Nutritional Assessment / Counseling / Intervention []  - 0 Lower Extremity Assessment (monofilament, tuning fork, pulses) []  - 0 Peripheral Arterial Disease Assessment (using hand held doppler) ASSESSMENTS - Ostomy and/or Continence Assessment and Care []  - 0 Incontinence Assessment and Management []  - 0 Ostomy Care Assessment and Management (repouching, etc.) PROCESS - Coordination of Care X - Simple Patient / Family Education for ongoing care 1 15 []  - 0 Complex (extensive) Patient / Family Education for ongoing care X- 1 10 Staff obtains Programmer, systems, Records, T Results / Process Orders est []  - 0 Staff telephones HHA, Nursing Homes / Clarify orders / etc []  - 0 Routine Transfer to another Facility (non-emergent condition) []  - 0 Routine Hospital Admission (non-emergent condition) []  - 0 New Admissions / Biomedical engineer / Ordering NPWT Apligraf, etc. , []  - 0 Emergency Hospital Admission (emergent condition) X- 1 10 Simple Discharge Coordination []  - 0 Complex (extensive) Discharge Coordination PROCESS - Special Needs []  - 0 Pediatric / Minor Patient Management []  - 0 Isolation Patient Management []  - 0 Hearing / Language / Visual special needs []  - 0 Assessment of Community assistance (transportation, D/C planning, etc.) []  - 0 Additional assistance / Altered mentation []  - 0 Support Surface(s)  Assessment (bed, cushion, seat, etc.) INTERVENTIONS - Wound Cleansing / Measurement X - Simple Wound Cleansing - one wound 1 5 []  - 0 Complex Wound Cleansing -  multiple wounds X- 1 5 Wound Imaging (photographs - any number of wounds) []  - 0 Wound Tracing (instead of photographs) X- 1 5 Simple Wound Measurement - one wound []  - 0 Complex Wound Measurement - multiple wounds INTERVENTIONS - Wound Dressings X - Small Wound Dressing one or multiple wounds 1 10 []  - 0 Medium Wound Dressing one or multiple wounds []  - 0 Large Wound Dressing one or multiple wounds X- 1 5 Application of Medications - topical []  - 0 Application of Medications - injection INTERVENTIONS - Miscellaneous []  - 0 External ear exam []  - 0 Specimen Collection (cultures, biopsies, blood, body fluids, etc.) []  - 0 Specimen(s) / Culture(s) sent or taken to Lab for analysis []  - 0 Patient Transfer (multiple staff / Civil Service fast streamer / Similar devices) []  - 0 Simple Staple / Suture removal (25 or less) []  - 0 Complex Staple / Suture removal (26 or more) []  - 0 Hypo / Hyperglycemic Management (close monitor of Blood Glucose) []  - 0 Ankle / Brachial Index (ABI) - do not check if billed separately X- 1 5 Vital Signs Has the patient been seen at the hospital within the last three years: Yes Total Score: 90 Level Of Care: New/Established - Level 3 Electronic Signature(s) Signed: 07/02/2020 5:51:39 PM By: Baruch Gouty RN, BSN Entered By: Baruch Gouty on 07/02/2020 13:07:25 -------------------------------------------------------------------------------- Encounter Discharge Information Details Patient Name: Date of Service: Alan Wang, Alan Pray D. 07/02/2020 12:30 PM Medical Record Number: [id number] Patient Account Number: [id number] Date of Birth/Sex: Treating RN: Nov 05, 1950 (70 y.o. Marcheta Grammes Primary Care Ronika Kelson: Aura Dials Other Clinician: Referring Tasmia Blumer: Treating Tamarion Haymond/Extender: Shawna Clamp in Treatment: 18 Encounter Discharge Information Items Discharge Condition: Stable Ambulatory Status: Ambulatory Discharge Destination: Home Transportation: Private Auto Accompanied By: wife Schedule Follow-up Appointment: Yes Clinical Summary of Care: Provided on 07/02/2020 Form Type Recipient Paper Patient Patient Electronic Signature(s) Signed: 07/02/2020 1:22:52 PM By: Lorrin Jackson Entered By: Lorrin Jackson on 07/02/2020 13:22:51 -------------------------------------------------------------------------------- Lower Extremity Assessment Details Patient Name: Date of Service: Alan Morton D. 07/02/2020 12:30 PM Medical Record Number: [id number] Patient Account Number: [id number] Date of Birth/Sex: Treating RN: May 19, 1950 (70 y.o. Marcheta Grammes Primary Care Noam Franzen: Aura Dials Other Clinician: Referring Berna Gitto: Treating Xzavion Doswell/Extender: Shawna Clamp in Treatment: 18 Electronic Signature(s) Signed: 07/02/2020 1:21:59 PM By: Lorrin Jackson Entered By: Lorrin Jackson on 07/02/2020 13:21:59 -------------------------------------------------------------------------------- Multi Wound Chart Details Patient Name: Date of Service: Alan Wang, Alan Pray D. 07/02/2020 12:30 PM Medical Record Number: [id number] Patient Account Number: [id number] Date of Birth/Sex: Treating RN: [birth year] (70 y.o. [name] Primary Care Judi Jaffe: Aura Dials Other Clinician: Referring Joya Willmott: Treating Isley Weisheit/Extender: Shawna Clamp in Treatment: 18 Vital Signs Height(in): 70 Capillary Blood Glucose(mg/dl): 94 Weight(lbs): 204 Pulse(bpm): 71 Body Mass Index(BMI): 29 Blood Pressure(mmHg): 135/75 Temperature(F): 98.4 Respiratory Rate(breaths/min): 16 Photos: [1:No Photos Coccyx] [2:No Photos Right Gluteus] [N/A:N/A N/A] Wound Location: [1:Gradually Appeared] [2:Gradually Appeared]  [N/A:N/A] Wounding Event: [1:Inflammatory] [2:Pressure Ulcer] [N/A:N/A] Primary Etiology: [1:Asthma, Coronary Artery Disease,] [2:Asthma, Coronary Artery Disease,] [N/A:N/A] Comorbid History: [1:Hypertension, Type II Diabetes 12/10/2019] [2:Hypertension, Type II Diabetes 06/06/2020] [N/A:N/A] Date Acquired: [1:18] [2:3] [N/A:N/A] Weeks of Treatment: [1:Open] [2:Open] [N/A:N/A] Wound Status: [1:0.3x0.2x0.1] [2:0.4x1x0.1] [N/A:N/A] Measurements L x W x D (cm) [1:0.047] [2:0.314] [N/A:N/A] A (cm) : rea [1:0.005] [2:0.031] [N/A:N/A] Volume (cm) : [1:99.70%] [2:11.00%] [N/A:N/A] % Reduction in Area: [1:99.70%] [2:11.40%] [N/A:N/A] % Reduction in Volume: [1:Full Thickness Without Exposed] [2:Category/Stage II] [N/A:N/A] Classification: [1:Support  Structures Medium] [2:Medium] [N/A:N/A] Exudate Amount: [1:Serosanguineous] [2:Serosanguineous] [N/A:N/A] Exudate Type: [1:red, brown] [2:red, brown] [N/A:N/A] Exudate Color: [1:Indistinct, nonvisible] [2:Flat and Intact] [N/A:N/A] Wound Margin: [1:Large (67-100%)] [2:Large (67-100%)] [N/A:N/A] Granulation Amount: [1:Pink] [2:Pink] [N/A:N/A] Granulation Quality: [1:None Present (0%)] [2:None Present (0%)] [N/A:N/A] Necrotic Amount: [1:Fascia: No] [2:Fat Layer (Subcutaneous Tissue): Yes N/A] Exposed Structures: [1:Fat Layer (Subcutaneous Tissue): No Tendon: No Muscle: No Joint: No Bone: No Large (67-100%)] [2:Fascia: No Tendon: No Muscle: No Joint: No Bone: No None] [N/A:N/A] Treatment Notes Electronic Signature(s) Signed: 07/02/2020 1:31:54 PM By: Kalman Shan DO Signed: 07/02/2020 5:51:39 PM By: Baruch Gouty RN, BSN Entered By: Kalman Shan on 07/02/2020 13:19:15 -------------------------------------------------------------------------------- Multi-Disciplinary Care Plan Details Patient Name: Date of Service: Alan Wang, Alan Pray D. 07/02/2020 12:30 PM Medical Record Number: [id number] Patient Account Number: [id number] Date of  Birth/Sex: Treating RN: Apr 10, 1950 (70 y.o. [name] Primary Care Ellyce Lafevers: Aura Dials Other Clinician: Referring Eliav Mechling: Treating Avner Stroder/Extender: Shawna Clamp in Treatment: [place] reviewed with physician Active Inactive Electronic Signature(s) Signed: 07/08/2020 5:08:22 PM By: Baruch Gouty RN, BSN Previous Signature: 07/02/2020 5:51:39 PM Version By: Baruch Gouty RN, BSN Entered By: Baruch Gouty on 07/08/2020 16:11:43 -------------------------------------------------------------------------------- Pain Assessment Details Patient Name: Date of Service: Alan Morton D. 07/02/2020 12:30 PM Medical Record Number: [id number] Patient Account Number: [id number] Date of Birth/Sex: Treating RN: [birth year] (70 y.o. Janyth Contes Primary Care Halil Rentz: Aura Dials Other Clinician: Referring Mahaila Tischer: Treating Chablis Losh/Extender: Shawna Clamp in Treatment: 18 Active Problems Location of Pain Severity and Description of Pain Patient Has Paino No Site Locations Pain Management and Medication Current Pain Management: Electronic Signature(s) Signed: 07/07/2020 5:39:21 PM By: Levan Hurst RN, BSN Entered By: Levan Hurst on 07/02/2020 12:43:02 -------------------------------------------------------------------------------- Patient/Caregiver Education Details Patient Name: Date of Service: Alan Morton D. 4/27/2022andnbsp12:30 PM Medical Record Number: [id number] Patient Account Number: [id number] Date of Birth/Gender: Treating RN: 06/21/1950 (69 y.o. [name] Primary Care Physician: Aura Dials Other Clinician: Referring Physician: Treating Physician/Extender: Shawna Clamp in Treatment: 18 Education Assessment Education Provided To: Patient Education Topics Provided Wound/Skin Impairment: Methods: Explain/Verbal Responses:  Reinforcements needed, State content correctly [employer]) Signed: 07/02/2020 5:51:39 PM By: Baruch Gouty RN, BSN Entered By: Baruch Gouty on 07/02/2020 13:04:57 -------------------------------------------------------------------------------- Wound Assessment Details Patient Name: Date of Service: Alan Morton D. 07/02/2020 12:30 PM Medical Record Number: [id number] Patient Account Number: [id number] Date of Birth/Sex: Treating RN: [birth year] (70 y.o. Janyth Contes Primary Care Leevi Cullars: Aura Dials Other Clinician: Referring Khadim Lundberg: Treating Kaylor Simenson/Extender: Shawna Clamp in Treatment: 18 Wound Status Wound Number: 1 Primary Inflammatory Etiology: Wound Location: Coccyx Wound Status: Open Wounding Event: Gradually Appeared Comorbid Asthma, Coronary Artery Disease, Hypertension, Type II Date Acquired: 12/10/2019 History: Diabetes Weeks Of Treatment: 18 Clustered Wound: No Photos Wound Measurements Length: (cm) 0.3 Width: (cm) 0.2 Depth: (cm) 0.1 Area: (cm) 0.047 Volume: (cm) 0.005 % Reduction in Area: 99.7% % Reduction in Volume: 99.7% Epithelialization: Large (67-100%) Tunneling: No Undermining: No Wound Description Classification: Full Thickness Without Exposed Support Structures Wound Margin: Indistinct, nonvisible Exudate Amount: Medium Exudate Type: Serosanguineous Exudate Color: red, brown Foul Odor After Cleansing: No Slough/Fibrino No Wound Bed Granulation Amount: Large (67-100%) Exposed Structure Granulation Quality: Pink Fascia Exposed: No Necrotic Amount: None Present (0%) Fat Layer (Subcutaneous Tissue) Exposed: No Tendon Exposed: No Muscle Exposed: No Joint Exposed: No Bone Exposed: No Electronic Signature(s) Signed: 07/02/2020 4:46:04 PM By: Sandre Kitty Signed: 07/07/2020  5:39:21 PM By: Levan Hurst RN, BSN Entered By: Sandre Kitty on 07/02/2020  16:30:23 -------------------------------------------------------------------------------- Wound Assessment Details Patient Name: Date of Service: Alan Morton D. 07/02/2020 12:30 PM Medical Record Number: [id number] Patient Account Number: [id number] Date of Birth/Sex: Treating RN: Jan 16, 1951 (70 y.o. Janyth Contes Primary Care Anaya Bovee: Aura Dials Other Clinician: Referring Serita Degroote: Treating Kenlynn Houde/Extender: Shawna Clamp in Treatment: 18 Wound Status Wound Number: 2 Primary Pressure Ulcer Etiology: Wound Location: Right Gluteus Wound Status: Open Wounding Event: Gradually Appeared Comorbid Asthma, Coronary Artery Disease, Hypertension, Type II Date Acquired: 06/06/2020 History: Diabetes Weeks Of Treatment: 3 Clustered Wound: No Photos Wound Measurements Length: (cm) 0.4 Width: (cm) 1 Depth: (cm) 0.1 Area: (cm) 0.314 Volume: (cm) 0.031 % Reduction in Area: 11% % Reduction in Volume: 11.4% Epithelialization: None Wound Description Classification: Category/Stage II Wound Margin: Flat and Intact Exudate Amount: Medium Exudate Type: Serosanguineous Exudate Color: red, brown Wound Bed Granulation Amount: Large (67-100%) Exposed Structure Granulation Quality: Pink Fascia Exposed: No Necrotic Amount: None Present (0%) Fat Layer (Subcutaneous Tissue) Exposed: Yes Tendon Exposed: No Muscle Exposed: No Joint Exposed: No Bone Exposed: No Electronic Signature(s) Signed: 07/02/2020 4:46:04 PM By: Sandre Kitty Signed: 07/07/2020 5:39:21 PM By: Levan Hurst RN, BSN Entered By: Sandre Kitty on 07/02/2020 16:30:44 -------------------------------------------------------------------------------- Vitals Details Patient Name: Date of Service: Alan Wang, Alan Pray D. 07/02/2020 12:30 PM Medical Record Number: [id number] Patient Account Number: [id number] Date of Birth/Sex: Treating RN: 09-11-1950 (70 y.o. Janyth Contes Primary Care  Elfrida Pixley: Aura Dials Other Clinician: Referring Elisse Pennick: Treating Lyndie Vanderloop/Extender: Shawna Clamp in Treatment: 18 Vital Signs Time Taken: 12:42 Temperature (F): 98.4 Height (in): 70 Pulse (bpm): 71 Weight (lbs): 204 Respiratory Rate (breaths/min): 16 Body Mass Index (BMI): 29.3 Blood Pressure (mmHg): 135/75 Capillary Blood Glucose (mg/dl): 94 Reference Range: 80 - 120 mg / dl Notes glucose per pt report Electronic Signature(s) Signed: 07/07/2020 5:39:21 PM By: Levan Hurst RN, BSN Entered By: Levan Hurst on 07/02/2020 12:42:55

## 2020-07-09 ENCOUNTER — Encounter (HOSPITAL_BASED_OUTPATIENT_CLINIC_OR_DEPARTMENT_OTHER): Payer: Medicare HMO | Admitting: Physician Assistant

## 2020-07-10 ENCOUNTER — Ambulatory Visit: Payer: Medicare HMO | Admitting: Physician Assistant

## 2020-07-10 ENCOUNTER — Other Ambulatory Visit: Payer: Self-pay

## 2020-07-10 ENCOUNTER — Encounter: Payer: Self-pay | Admitting: Physician Assistant

## 2020-07-10 VITALS — BP 113/75 | HR 73 | Resp 16 | Ht 70.5 in | Wt 207.0 lb

## 2020-07-10 DIAGNOSIS — Z79899 Other long term (current) drug therapy: Secondary | ICD-10-CM

## 2020-07-10 DIAGNOSIS — R52 Pain, unspecified: Secondary | ICD-10-CM

## 2020-07-10 DIAGNOSIS — D472 Monoclonal gammopathy: Secondary | ICD-10-CM

## 2020-07-10 DIAGNOSIS — M170 Bilateral primary osteoarthritis of knee: Secondary | ICD-10-CM | POA: Diagnosis not present

## 2020-07-10 DIAGNOSIS — M1990 Unspecified osteoarthritis, unspecified site: Secondary | ICD-10-CM | POA: Diagnosis not present

## 2020-07-10 DIAGNOSIS — G8929 Other chronic pain: Secondary | ICD-10-CM

## 2020-07-10 DIAGNOSIS — Z8673 Personal history of transient ischemic attack (TIA), and cerebral infarction without residual deficits: Secondary | ICD-10-CM

## 2020-07-10 DIAGNOSIS — Z9889 Other specified postprocedural states: Secondary | ICD-10-CM

## 2020-07-10 DIAGNOSIS — Z8679 Personal history of other diseases of the circulatory system: Secondary | ICD-10-CM

## 2020-07-10 DIAGNOSIS — M1380 Other specified arthritis, unspecified site: Secondary | ICD-10-CM

## 2020-07-10 DIAGNOSIS — M19042 Primary osteoarthritis, left hand: Secondary | ICD-10-CM

## 2020-07-10 DIAGNOSIS — Z8639 Personal history of other endocrine, nutritional and metabolic disease: Secondary | ICD-10-CM | POA: Diagnosis not present

## 2020-07-10 DIAGNOSIS — M19041 Primary osteoarthritis, right hand: Secondary | ICD-10-CM

## 2020-07-10 NOTE — Progress Notes (Signed)
Medication Samples have been provided to the patient.  Drug name: pennsaid     Strength: 2%        Qty: 1 box  LOT: [id number]  Exp.Date: 08/2021  Dosing instructions: apply one packet to affected area once daily.

## 2020-07-10 NOTE — Patient Instructions (Addendum)
Standing Labs We placed an order today for your standing lab work.   Please have your standing labs drawn in August and every 3 months   If possible, please have your labs drawn 2 weeks prior to your appointment so that the provider can discuss your results at your appointment.  We have open lab daily Monday through Thursday from 1:30-4:30 PM and Friday from 1:30-4:00 PM at the office of Dr. Bo Merino, [facility].   Please be advised, all patients with office appointments requiring lab work will take precedents over walk-in lab work.  If possible, please come for your lab work on Monday and Friday afternoons, as you may experience shorter wait times. The office is located at [address], [place], [place], [place] [address] No appointment is necessary.   Labs are drawn by [facility]. Please bring your co-pay at the time of your lab draw.  You may receive a bill from [facility] for your lab work.  If you wish to have your labs drawn at another location, please call the office 24 hours in advance to send orders.  If you have any questions regarding directions or hours of operation,  please call [phone].   As a reminder, please drink plenty of water prior to coming for your lab work. Thanks!    Hand Exercises Hand exercises can be helpful for almost anyone. These exercises can strengthen the hands, improve flexibility and movement, and increase blood flow to the hands. These results can make work and daily tasks easier. Hand exercises can be especially helpful for people who have joint pain from arthritis or have nerve damage from overuse (carpal tunnel syndrome). These exercises can also help people who have injured a hand. Exercises Most of these hand exercises are gentle stretching and motion exercises. It is usually safe to do them often throughout the day. Warming up your hands before exercise may help to reduce stiffness. You can do this with gentle massage or by  placing your hands in warm water for 10-15 minutes. It is normal to feel some stretching, pulling, tightness, or mild discomfort as you begin new exercises. This will gradually improve. Stop an exercise right away if you feel sudden, severe pain or your pain gets worse. Ask your health care provider which exercises are best for you. Knuckle bend or "claw" fist 1. Stand or sit with your arm, hand, and all five fingers pointed straight up. Make sure to keep your wrist straight during the exercise. 2. Gently bend your fingers down toward your palm until the tips of your fingers are touching the top of your palm. Keep your big knuckle straight and just bend the small knuckles in your fingers. 3. Hold this position for __________ seconds. 4. Straighten (extend) your fingers back to the starting position. Repeat this exercise 5-10 times with each hand. Full finger fist 1. Stand or sit with your arm, hand, and all five fingers pointed straight up. Make sure to keep your wrist straight during the exercise. 2. Gently bend your fingers into your palm until the tips of your fingers are touching the middle of your palm. 3. Hold this position for __________ seconds. 4. Extend your fingers back to the starting position, stretching every joint fully. Repeat this exercise 5-10 times with each hand. Straight fist 1. Stand or sit with your arm, hand, and all five fingers pointed straight up. Make sure to keep your wrist straight during the exercise. 2. Gently bend your fingers at the  big knuckle, where your fingers meet your hand, and the middle knuckle. Keep the knuckle at the tips of your fingers straight and try to touch the bottom of your palm. 3. Hold this position for __________ seconds. 4. Extend your fingers back to the starting position, stretching every joint fully. Repeat this exercise 5-10 times with each hand. Tabletop 1. Stand or sit with your arm, hand, and all five fingers pointed straight up.  Make sure to keep your wrist straight during the exercise. 2. Gently bend your fingers at the big knuckle, where your fingers meet your hand, as far down as you can while keeping the small knuckles in your fingers straight. Think of forming a tabletop with your fingers. 3. Hold this position for __________ seconds. 4. Extend your fingers back to the starting position, stretching every joint fully. Repeat this exercise 5-10 times with each hand. Finger spread 1. Place your hand flat on a table with your palm facing down. Make sure your wrist stays straight as you do this exercise. 2. Spread your fingers and thumb apart from each other as far as you can until you feel a gentle stretch. Hold this position for __________ seconds. 3. Bring your fingers and thumb tight together again. Hold this position for __________ seconds. Repeat this exercise 5-10 times with each hand. Making circles 1. Stand or sit with your arm, hand, and all five fingers pointed straight up. Make sure to keep your wrist straight during the exercise. 2. Make a circle by touching the tip of your thumb to the tip of your index finger. 3. Hold for __________ seconds. Then open your hand wide. 4. Repeat this motion with your thumb and each finger on your hand. Repeat this exercise 5-10 times with each hand. Thumb motion 1. Sit with your forearm resting on a table and your wrist straight. Your thumb should be facing up toward the ceiling. Keep your fingers relaxed as you move your thumb. 2. Lift your thumb up as high as you can toward the ceiling. Hold for __________ seconds. 3. Bend your thumb across your palm as far as you can, reaching the tip of your thumb for the small finger (pinkie) side of your palm. Hold for __________ seconds. Repeat this exercise 5-10 times with each hand. Grip strengthening 1. Hold a stress ball or other soft ball in the middle of your hand. 2. Slowly increase the pressure, squeezing the ball as much  as you can without causing pain. Think of bringing the tips of your fingers into the middle of your palm. All of your finger joints should bend when doing this exercise. 3. Hold your squeeze for __________ seconds, then relax. Repeat this exercise 5-10 times with each hand.   Contact a health care provider if:  Your hand pain or discomfort gets much worse when you do an exercise.  Your hand pain or discomfort does not improve within 2 hours after you exercise. If you have any of these problems, stop doing these exercises right away. Do not do them again unless your health care provider says that you can. Get help right away if:  You develop sudden, severe hand pain or swelling. If this happens, stop doing these exercises right away. Do not do them again unless your health care provider says that you can. This information is not intended to replace advice given to you by your health care provider. Make sure you discuss any questions you have with your health care provider. Document  Revised: 06/15/2018 Document Reviewed: 02/23/2018 Elsevier Patient Education  2021 [place] [employer] for Nurse [employer]itioners, 15(4), [phone]. Retrieved December 12, 2017 from [email or web address].com/[email or web address]">  Knee Exercises Ask your health care provider which exercises are safe for you. Do exercises exactly as told by your health care provider and adjust them as directed. It is normal to feel mild stretching, pulling, tightness, or discomfort as you do these exercises. Stop right away if you feel sudden pain or your pain gets worse. Do not begin these exercises until told by your health care provider. Stretching and range-of-motion exercises These exercises warm up your muscles and joints and improve the movement and flexibility of your knee. These exercises also help to relieve pain and swelling. Knee extension, prone 1. Lie on your abdomen (prone position) on a bed. 2. Place your left / right knee just  beyond the edge of the surface so your knee is not on the bed. You can put a towel under your left / right thigh just above your kneecap for comfort. 3. Relax your leg muscles and allow gravity to straighten your knee (extension). You should feel a stretch behind your left / right knee. 4. Hold this position for __________ seconds. 5. Scoot up so your knee is supported between repetitions. Repeat __________ times. Complete this exercise __________ times a day. Knee flexion, active 1. Lie on your back with both legs straight. If this causes back discomfort, bend your left / right knee so your foot is flat on the floor. 2. Slowly slide your left / right heel back toward your buttocks. Stop when you feel a gentle stretch in the front of your knee or thigh (flexion). 3. Hold this position for __________ seconds. 4. Slowly slide your left / right heel back to the starting position. Repeat __________ times. Complete this exercise __________ times a day.   Quadriceps stretch, prone 1. Lie on your abdomen on a firm surface, such as a bed or padded floor. 2. Bend your left / right knee and hold your ankle. If you cannot reach your ankle or pant leg, loop a belt around your foot and grab the belt instead. 3. Gently pull your heel toward your buttocks. Your knee should not slide out to the side. You should feel a stretch in the front of your thigh and knee (quadriceps). 4. Hold this position for __________ seconds. Repeat __________ times. Complete this exercise __________ times a day.   Hamstring, supine 1. Lie on your back (supine position). 2. Loop a belt or towel over the ball of your left / right foot. The ball of your foot is on the walking surface, right under your toes. 3. Straighten your left / right knee and slowly pull on the belt to raise your leg until you feel a gentle stretch behind your knee (hamstring). ? Do not let your knee bend while you do this. ? Keep your other leg flat on the  floor. 4. Hold this position for __________ seconds. Repeat __________ times. Complete this exercise __________ times a day. Strengthening exercises These exercises build strength and endurance in your knee. Endurance is the ability to use your muscles for a long time, even after they get tired. Quadriceps, isometric This exercise stretches the muscles in front of your thigh (quadriceps) without moving your knee joint (isometric). 1. Lie on your back with your left / right leg extended and your other knee bent. Put a rolled towel or small pillow under your knee if  told by your health care provider. 2. Slowly tense the muscles in the front of your left / right thigh. You should see your kneecap slide up toward your hip or see increased dimpling just above the knee. This motion will push the back of the knee toward the floor. 3. For __________ seconds, hold the muscle as tight as you can without increasing your pain. 4. Relax the muscles slowly and completely. Repeat __________ times. Complete this exercise __________ times a day.   Straight leg raises This exercise stretches the muscles in front of your thigh (quadriceps) and the muscles that move your hips (hip flexors). 1. Lie on your back with your left / right leg extended and your other knee bent. 2. Tense the muscles in the front of your left / right thigh. You should see your kneecap slide up or see increased dimpling just above the knee. Your thigh may even shake a bit. 3. Keep these muscles tight as you raise your leg 4-6 inches (10-15 cm) off the floor. Do not let your knee bend. 4. Hold this position for __________ seconds. 5. Keep these muscles tense as you lower your leg. 6. Relax your muscles slowly and completely after each repetition. Repeat __________ times. Complete this exercise __________ times a day. Hamstring, isometric 1. Lie on your back on a firm surface. 2. Bend your left / right knee about __________ degrees. 3. Dig  your left / right heel into the surface as if you are trying to pull it toward your buttocks. Tighten the muscles in the back of your thighs (hamstring) to "dig" as hard as you can without increasing any pain. 4. Hold this position for __________ seconds. 5. Release the tension gradually and allow your muscles to relax completely for __________ seconds after each repetition. Repeat __________ times. Complete this exercise __________ times a day. Hamstring curls If told by your health care provider, do this exercise while wearing ankle weights. Begin with __________ lb weights. Then increase the weight by 1 lb (0.5 kg) increments. Do not wear ankle weights that are more than __________ lb. 1. Lie on your abdomen with your legs straight. 2. Bend your left / right knee as far as you can without feeling pain. Keep your hips flat against the floor. 3. Hold this position for __________ seconds. 4. Slowly lower your leg to the starting position. Repeat __________ times. Complete this exercise __________ times a day.   Squats This exercise strengthens the muscles in front of your thigh and knee (quadriceps). 1. Stand in front of a table, with your feet and knees pointing straight ahead. You may rest your hands on the table for balance but not for support. 2. Slowly bend your knees and lower your hips like you are going to sit in a chair. ? Keep your weight over your heels, not over your toes. ? Keep your lower legs upright so they are parallel with the table legs. ? Do not let your hips go lower than your knees. ? Do not bend lower than told by your health care provider. ? If your knee pain increases, do not bend as low. 3. Hold the squat position for __________ seconds. 4. Slowly push with your legs to return to standing. Do not use your hands to pull yourself to standing. Repeat __________ times. Complete this exercise __________ times a day. Wall slides This exercise strengthens the muscles in  front of your thigh and knee (quadriceps). 1. Lean your back against a  smooth wall or door, and walk your feet out 18-24 inches (46-61 cm) from it. 2. Place your feet hip-width apart. 3. Slowly slide down the wall or door until your knees bend __________ degrees. Keep your knees over your heels, not over your toes. Keep your knees in line with your hips. 4. Hold this position for __________ seconds. Repeat __________ times. Complete this exercise __________ times a day.   Straight leg raises This exercise strengthens the muscles that rotate the leg at the hip and move it away from your body (hip abductors). 1. Lie on your side with your left / right leg in the top position. Lie so your head, shoulder, knee, and hip line up. You may bend your bottom knee to help you keep your balance. 2. Roll your hips slightly forward so your hips are stacked directly over each other and your left / right knee is facing forward. 3. Leading with your heel, lift your top leg 4-6 inches (10-15 cm). You should feel the muscles in your outer hip lifting. ? Do not let your foot drift forward. ? Do not let your knee roll toward the ceiling. 4. Hold this position for __________ seconds. 5. Slowly return your leg to the starting position. 6. Let your muscles relax completely after each repetition. Repeat __________ times. Complete this exercise __________ times a day.   Straight leg raises This exercise stretches the muscles that move your hips away from the front of the pelvis (hip extensors). 1. Lie on your abdomen on a firm surface. You can put a pillow under your hips if that is more comfortable. 2. Tense the muscles in your buttocks and lift your left / right leg about 4-6 inches (10-15 cm). Keep your knee straight as you lift your leg. 3. Hold this position for __________ seconds. 4. Slowly lower your leg to the starting position. 5. Let your leg relax completely after each repetition. Repeat __________ times.  Complete this exercise __________ times a day. This information is not intended to replace advice given to you by your health care provider. Make sure you discuss any questions you have with your health care provider. Document Revised: 12/13/2017 Document Reviewed: 12/13/2017 Elsevier Patient Education  2021 [employer].   Back Exercises These exercises help to make your trunk and back strong. They also help to keep the lower back flexible. Doing these exercises can help to prevent back pain or lessen existing pain.  If you have back pain, try to do these exercises 2-3 times each day or as told by your doctor.  As you get better, do the exercises once each day. Repeat the exercises more often as told by your doctor.  To stop back pain from coming back, do the exercises once each day, or as told by your doctor. Exercises Single knee to chest Do these steps 3-5 times in a row for each leg: 1. Lie on your back on a firm bed or the floor with your legs stretched out. 2. Bring one knee to your chest. 3. Grab your knee or thigh with both hands and hold them it in place. 4. Pull on your knee until you feel a gentle stretch in your lower back or buttocks. 5. Keep doing the stretch for 10-30 seconds. 6. Slowly let go of your leg and straighten it. Pelvic tilt Do these steps 5-10 times in a row: 1. Lie on your back on a firm bed or the floor with your legs stretched out. 2.  Bend your knees so they point up to the ceiling. Your feet should be flat on the floor. 3. Tighten your lower belly (abdomen) muscles to press your lower back against the floor. This will make your tailbone point up to the ceiling instead of pointing down to your feet or the floor. 4. Stay in this position for 5-10 seconds while you gently tighten your muscles and breathe evenly. Cat-cow Do these steps until your lower back bends more easily: 1. Get on your hands and knees on a firm surface. Keep your hands under your  shoulders, and keep your knees under your hips. You may put padding under your knees. 2. Let your head hang down toward your chest. Tighten (contract) the muscles in your belly. Point your tailbone toward the floor so your lower back becomes rounded like the back of a cat. 3. Stay in this position for 5 seconds. 4. Slowly lift your head. Let the muscles of your belly relax. Point your tailbone up toward the ceiling so your back forms a sagging arch like the back of a cow. 5. Stay in this position for 5 seconds.   Press-ups Do these steps 5-10 times in a row: 1. Lie on your belly (face-down) on the floor. 2. Place your hands near your head, about shoulder-width apart. 3. While you keep your back relaxed and keep your hips on the floor, slowly straighten your arms to raise the top half of your body and lift your shoulders. Do not use your back muscles. You may change where you place your hands in order to make yourself more comfortable. 4. Stay in this position for 5 seconds. 5. Slowly return to lying flat on the floor.   Bridges Do these steps 10 times in a row: 1. Lie on your back on a firm surface. 2. Bend your knees so they point up to the ceiling. Your feet should be flat on the floor. Your arms should be flat at your sides, next to your body. 3. Tighten your butt muscles and lift your butt off the floor until your waist is almost as high as your knees. If you do not feel the muscles working in your butt and the back of your thighs, slide your feet 1-2 inches farther away from your butt. 4. Stay in this position for 3-5 seconds. 5. Slowly lower your butt to the floor, and let your butt muscles relax. If this exercise is too easy, try doing it with your arms crossed over your chest.   Belly crunches Do these steps 5-10 times in a row: 1. Lie on your back on a firm bed or the floor with your legs stretched out. 2. Bend your knees so they point up to the ceiling. Your feet should be flat on  the floor. 3. Cross your arms over your chest. 4. Tip your chin a little bit toward your chest but do not bend your neck. 5. Tighten your belly muscles and slowly raise your chest just enough to lift your shoulder blades a tiny bit off of the floor. Avoid raising your body higher than that, because it can put too much stress on your low back. 6. Slowly lower your chest and your head to the floor. Back lifts Do these steps 5-10 times in a row: 1. Lie on your belly (face-down) with your arms at your sides, and rest your forehead on the floor. 2. Tighten the muscles in your legs and your butt. 3. Slowly lift your chest  off of the floor while you keep your hips on the floor. Keep the back of your head in line with the curve in your back. Look at the floor while you do this. 4. Stay in this position for 3-5 seconds. 5. Slowly lower your chest and your face to the floor. Contact a doctor if:  Your back pain gets a lot worse when you do an exercise.  Your back pain does not get better 2 hours after you exercise. If you have any of these problems, stop doing the exercises. Do not do them again unless your doctor says it is okay. Get help right away if:  You have sudden, very bad back pain. If this happens, stop doing the exercises. Do not do them again unless your doctor says it is okay. This information is not intended to replace advice given to you by your health care provider. Make sure you discuss any questions you have with your health care provider. Document Revised: 11/17/2017 Document Reviewed: 11/17/2017 Elsevier Patient Education  2021 [employer].

## 2020-07-10 NOTE — Progress Notes (Signed)
Office Visit Note  Patient: Alan Wang             Date of Birth: [birth year]           MRN: [id number]             PCP: Aura Dials, MD Referring: Aura Dials, MD Visit Date: 07/10/2020 Occupation: @GUAROCC @  Subjective:  Body aches  History of Present Illness: Alan Wang is a 70 y.o. male with history of inflammatory arthritis and osteoarthritis.  He is taking sulfasalazine 500 mg 2 tablets in the morning and 2 tablets in the evening.  He continues to tolerate sulfasalazine without any side effects.  He has been experiencing increased myalgias and arthralgias over the past several weeks.  He has not noticed any joint swelling.  He states he has had to increase the frequency of taking tramadol as well as methocarbamol to manage his symptoms. He has had to take about 8 tablets of tramadol over the past 1 week.  He has been experiencing significant pain upon first lying down at night.  He is currently having discomfort in his lower back, both hands, and the left knee joint.  He has seen several commercials for new rheumatoid arthritis medications that he is curious about as possible treatment options.  He has not had any recent infections.   Activities of Daily Living:  Patient reports morning stiffness for 10-20 minutes.    Patient Reports nocturnal pain.  Difficulty dressing/grooming: Denies Difficulty climbing stairs: Reports Difficulty getting out of chair: Reports Difficulty using hands for taps, buttons, cutlery, and/or writing: Reports  Review of Systems  Constitutional: Positive for fatigue.  HENT: Positive for mouth dryness. Negative for mouth sores and nose dryness.   Eyes: Positive for visual disturbance and dryness. Negative for pain and itching.  Respiratory: Negative for shortness of breath and difficulty breathing.   Cardiovascular: Negative for chest pain and palpitations.  Gastrointestinal: Negative for blood in stool, constipation and diarrhea.   Endocrine: Negative for increased urination.  Genitourinary: Positive for difficulty urinating.  Musculoskeletal: Positive for arthralgias, joint pain, myalgias and myalgias. Negative for joint swelling and muscle tenderness.  Skin: Negative for color change, rash and redness.  Allergic/Immunologic: Negative for susceptible to infections.  Neurological: Positive for dizziness and numbness. Negative for headaches, memory loss and weakness.  Hematological: Positive for bruising/bleeding tendency.  Psychiatric/Behavioral: Negative for confusion.    PMFS History:  Patient Active Problem List   Diagnosis Date Noted  . TIA (transient ischemic attack) 06/22/2016  . Type 2 diabetes mellitus without complication ([place]) 123456  . Primary osteoarthritis of both hands 06/03/2016  . Primary osteoarthritis of both knees 06/03/2016  . Generalized pain 06/03/2016  . Memory difficulties 06/03/2016  . History of diabetes mellitus 06/03/2016  . History of coronary artery disease 06/03/2016  . History of hypertension 06/03/2016  . History of hypercholesterolemia 06/03/2016  . High risk medication use 06/03/2016  . History of high cholesterol 06/03/2016  . ANA positive 06/03/2016  . Inflammatory arthritis 10/29/2015  . Vitamin D deficiency 10/29/2015  . Fatigue 09/24/2015  . MGUS (monoclonal gammopathy of unknown significance) 09/22/2015  . Vertigo 10/31/2013  . Chronic cough 11/16/2012  . CAD (coronary artery disease), native coronary artery 03/08/2011  . Postsurgical percutaneous transluminal coronary angioplasty (PTCA) status 03/08/2011  . Type II or unspecified type diabetes mellitus with other coma, not stated as uncontrolled 03/08/2011  . Hypertension associated with diabetes ([place]) 03/08/2011  . Shortness of breath 03/08/2011  .  Mixed hyperlipidemia 03/08/2011    Past Medical History:  Diagnosis Date  . Arthritis   . Asthma   . Coronary artery disease   . COVID   . Diabetes  mellitus    type 2, insulin dependent for 30+ years  . History of kidney stones 11/2012  . Hyperlipemia   . Hypertension   . IDDM (insulin dependent diabetes mellitus)   . Shortness of breath   . Stroke ([place])    hx TIA    Family History  Problem Relation Age of Onset  . Emphysema Mother   . COPD Mother   . Alzheimer's disease Father   . Asthma Paternal Aunt   . Heart attack Paternal Grandfather    Past Surgical History:  Procedure Laterality Date  . ANGIOPLASTY  04/06/2011   x3   . CARDIAC CATHETERIZATION    . CARDIAC CATHETERIZATION N/A 09/16/2015   Procedure: Left Heart Cath and Coronary Angiography;  Surgeon: Adrian Prows, MD;  Location: [facility];  Service: Cardiovascular;  Laterality: N/A;  . cataracts    . CHOLECYSTECTOMY    . HERNIA REPAIR     x2  . KNEE ARTHROSCOPY    . LEFT HEART CATHETERIZATION WITH CORONARY ANGIOGRAM N/A 03/08/2011   Procedure: LEFT HEART CATHETERIZATION WITH CORONARY ANGIOGRAM;  Surgeon: Laverda Page, MD;  Location: [facility] CATH LAB;  Service: Cardiovascular;  Laterality: N/A;  . PERCUTANEOUS CORONARY STENT INTERVENTION (PCI-S) N/A 04/06/2011   Procedure: PERCUTANEOUS CORONARY STENT INTERVENTION (PCI-S);  Surgeon: Laverda Page, MD;  Location: [facility] CATH LAB;  Service: Cardiovascular;  Laterality: N/A;   Social History   Social History Narrative   Patient lives at home with his spouse.   Caffeine Use: occasionally   Immunization History  Administered Date(s) Administered  . PFIZER(Purple Top)SARS-COV-2 Vaccination 04/02/2019, 04/23/2019, 06/17/2020  . Pneumococcal Polysaccharide-23 12/14/2011     Objective: Vital Signs: BP 113/75 (BP Location: Left Arm, Patient Position: Sitting, Cuff Size: Normal)   Pulse 73   Resp 16   Ht 5' 10.5" (1.791 m)   Wt 207 lb (93.9 kg)   BMI 29.28 kg/m    Physical Exam Vitals and nursing note reviewed.  Constitutional:      Appearance: He is well-developed.  HENT:     Head: Normocephalic and  atraumatic.  Eyes:     Conjunctiva/sclera: Conjunctivae normal.     Pupils: Pupils are equal, round, and reactive to light.  Pulmonary:     Effort: Pulmonary effort is normal.  Abdominal:     Palpations: Abdomen is soft.  Musculoskeletal:     Cervical back: Normal range of motion and neck supple.  Skin:    General: Skin is warm and dry.     Capillary Refill: Capillary refill takes less than 2 seconds.  Neurological:     Mental Status: He is alert and oriented to person, place, and time.  Psychiatric:        Behavior: Behavior normal.      Musculoskeletal Exam: C-spine limited ROM.  Thoracic kyphosis.  Midline spinal tenderness in the lumbar region.  Shoulder joints, elbow joints, wrist joints good ROM with no discomfort.  PIP and DIP thickening consistent with osteoarthritis of both hands.  Hip joints good ROM with no groin pain.  Painful ROM of the left knee joint.  No warmth or effusion of knee joints.  Ankle joints have good ROM with no discomfort. No tenderness or joint swelling.    CDAI Exam: CDAI Score: -- Patient  Global: --; Provider Global: -- Swollen: --; Tender: -- Joint Exam 07/10/2020   No joint exam has been documented for this visit   There is currently no information documented on the homunculus. Go to the Rheumatology activity and complete the homunculus joint exam.  Investigation: No additional findings.  Imaging: No results found.  Recent Labs: Lab Results  Component Value Date   WBC 7.3 04/01/2020   HGB 16.2 04/01/2020   PLT 216 04/01/2020   NA 141 04/01/2020   K 4.8 04/01/2020   CL 104 04/01/2020   CO2 25 04/01/2020   GLUCOSE 130 (H) 04/01/2020   BUN 14 04/01/2020   CREATININE 1.05 04/01/2020   BILITOT 0.3 04/01/2020   ALKPHOS 76 10/26/2016   AST 25 04/01/2020   ALT 23 04/01/2020   PROT 6.5 04/01/2020   ALBUMIN 4.2 10/26/2016   CALCIUM 9.4 04/01/2020   GFRAA 82 04/01/2020    Speciality Comments: No specialty comments  available.  Procedures:  No procedures performed Allergies: Avandia [rosiglitazone maleate]   Assessment / Plan:     Visit Diagnoses: Inflammatory arthritis: He has no synovitis or dactylitis on examination today.  He has PIP and DIP thickening consistent with osteoarthritis of both hands.  He has difficulty making a complete fist bilaterally due to the discomfort and stiffness he is experiencing.  He has been experiencing increased generalized myalgias and arthralgias over the past several weeks with no identifiable trigger.  He has had to increase the frequency of tramadol and methocarbamol dosing due to the severity of pain.  He has been experiencing increased nocturnal pain due to discomfort in his lower back, hands, and left knee joint.  He has no inflammation on examination today.  Overall his inflammatory thrice is well controlled on sulfasalazine 500 mg 2 tablets by mouth twice daily.  He continues to tolerate sulfasalazine without any side effects and has not missed any doses recently.  His discomfort is likely due to underlying osteoarthritis.  We discussed the list of natural anti-inflammatories which she can start taking as tolerated.  He was also given a sample of Pennsaid which she can apply topically to both knees once daily as needed for pain relief.  We also discussed the importance of increasing his exercise regimen as well as joint protection.  He will remain on sulfasalazine as prescribed.  He was advised to notify us if he develops increased joint pain or joint swelling.  He will follow-up in the office in 5 months.  High risk medication use - He is taking sulfasalazine 500 mg 2 tablets twice daily.  CBC and CMP were drawn on 04/01/2020.  He is due to update lab work today.  Orders for CBC and CMP were released.  Next lab work will be due in August and every 3 months to monitor for drug toxicity.  Standing orders for CBC and CMP remain in place.  Plan: COMPLETE METABOLIC PANEL WITH GFR,  CBC with Differential/Platelet He has not had any recent infections.  We discussed the importance of holding sulfasalazine if he develops signs or symptoms of an infection and to resume once the infection has completely cleared.  He voiced understanding. He has received 3 Pfizer COVID-19 vaccine doses.  He is not eligible to receive the second booster at this time.  Generalized pain - He has been experiencing increased generalized myalgias and arthralgias over the past several weeks.  He has no inflammation on examination today.  His discomfort seems to be due to underlying  osteoarthritis.  He has had to increase his frequency of taking tramadol and methocarbamol to manage his symptoms.  We will check the following lab work today for further evaluation.  Plan: Sedimentation rate, Serum protein electrophoresis with reflex, CK  Primary osteoarthritis of both hands: He has PIP and DIP thickening consistent with osteoarthritis of both hands.  Incomplete fist formation bilaterally.  We discussed the importance of joint protection and muscle strengthening.  He was given a list of natural anti-inflammatories to start taking.  He was also given a handout of hand exercises to perform.  Primary osteoarthritis of both knees: He continues to experience intermittent discomfort in both knee joints especially the left knee.  He has good range of motion of the left knee joint on examination today with no warmth or effusion.  He was given a sample of Pennsaid to apply topically daily as needed for pain relief.  We also discussed the importance of increasing his exercise regimen as well as lower extremity muscle strengthening.  History of arthroscopy of left knee: Chronic pain.   Other chronic pain: He takes tramadol as needed for pain relief.  His PCP has been prescribing tramadol.  History of vitamin D deficiency: He takes vitamin D 2000 units daily.  Other medical conditions are listed as follows:   MGUS  (monoclonal gammopathy of unknown significance)  History of hypertension  History of hypercholesterolemia  History of coronary artery disease  History of TIA (transient ischemic attack)  History of diabetes mellitus   Orders: Orders Placed This Encounter  Procedures  . COMPLETE METABOLIC PANEL WITH GFR  . CBC with Differential/Platelet  . Sedimentation rate  . Serum protein electrophoresis with reflex  . CK   No orders of the defined types were placed in this encounter.   Follow-Up Instructions: Return in about 6 months (around 01/10/2021) for Inflammatory arthritis , Osteoarthritis.   Ofilia Neas, PA-C  Note - This record has been created using Dragon software.  Chart creation errors have been sought, but may not always  have been located. Such creation errors do not reflect on  the standard of medical care.

## 2020-07-11 ENCOUNTER — Telehealth: Payer: Self-pay

## 2020-07-11 NOTE — Telephone Encounter (Signed)
Pts wife called and stated that her husbands dermatologist recommended that the pt take turmeric. He stated that he would need to get permission from you before he could take it.

## 2020-07-11 NOTE — Progress Notes (Signed)
CBC and CMP WNL.  ESR and CK WNL

## 2020-07-12 NOTE — Telephone Encounter (Signed)
  No contraindication. I sent him my chart message as well and ask if they got it

## 2020-07-14 LAB — PROTEIN ELECTROPHORESIS, SERUM, WITH REFLEX
Albumin ELP: 4.1 g/dL (ref 3.8–4.8)
Alpha 1: 0.3 g/dL (ref 0.2–0.3)
Alpha 2: 0.8 g/dL (ref 0.5–0.9)
Beta 2: 0.3 g/dL (ref 0.2–0.5)
Beta Globulin: 0.5 g/dL (ref 0.4–0.6)
Gamma Globulin: 0.8 g/dL (ref 0.8–1.7)
Total Protein: 6.8 g/dL (ref 6.1–8.1)

## 2020-07-14 LAB — COMPLETE METABOLIC PANEL WITH GFR
AG Ratio: 1.7 (calc) (ref 1.0–2.5)
ALT: 23 U/L (ref 9–46)
AST: 20 U/L (ref 10–35)
Albumin: 4.3 g/dL (ref 3.6–5.1)
Alkaline phosphatase (APISO): 76 U/L (ref 35–144)
BUN: 17 mg/dL (ref 7–25)
CO2: 27 mmol/L (ref 20–32)
Calcium: 9.7 mg/dL (ref 8.6–10.3)
Chloride: 104 mmol/L (ref 98–110)
Creat: 0.8 mg/dL (ref 0.70–1.18)
GFR, Est African American: 104 mL/min/{1.73_m2} (ref >=60)
GFR, Est Non African American: 90 mL/min/{1.73_m2} (ref >=60)
Globulin: 2.5 g/dL (calc) (ref 1.9–3.7)
Glucose, Bld: 105 mg/dL — ABNORMAL HIGH (ref 65–99)
Potassium: 4.9 mmol/L (ref 3.5–5.3)
Sodium: 143 mmol/L (ref 135–146)
Total Bilirubin: 0.4 mg/dL (ref 0.2–1.2)
Total Protein: 6.8 g/dL (ref 6.1–8.1)

## 2020-07-14 LAB — CBC WITH DIFFERENTIAL/PLATELET
Absolute Monocytes: 910 cells/uL (ref 200–950)
Basophils Absolute: 42 cells/uL (ref 0–200)
Basophils Relative: 0.6 %
Eosinophils Absolute: 91 cells/uL (ref 15–500)
Eosinophils Relative: 1.3 %
HCT: 47 % (ref 38.5–50.0)
Hemoglobin: 15.6 g/dL (ref 13.2–17.1)
Lymphs Abs: 2954 cells/uL (ref 850–3900)
MCH: 30.8 pg (ref 27.0–33.0)
MCHC: 33.2 g/dL (ref 32.0–36.0)
MCV: 92.9 fL (ref 80.0–100.0)
MPV: 9.5 fL (ref 7.5–12.5)
Monocytes Relative: 13 %
Neutro Abs: 3003 cells/uL (ref 1500–7800)
Neutrophils Relative %: 42.9 %
Platelets: 201 10*3/uL (ref 140–400)
RBC: 5.06 10*6/uL (ref 4.20–5.80)
RDW: 12.8 % (ref 11.0–15.0)
Total Lymphocyte: 42.2 %
WBC: 7 10*3/uL (ref 3.8–10.8)

## 2020-07-14 LAB — CK: Total CK: 84 U/L (ref 44–196)

## 2020-07-14 LAB — SEDIMENTATION RATE: Sed Rate: 2 mm/h (ref 0–20)

## 2020-07-14 NOTE — Progress Notes (Signed)
Normal SPEP

## 2020-07-14 NOTE — Telephone Encounter (Signed)
Called and spoke to pts wife regarding turmeric. Pts wife stated that they do not use mychart.

## 2020-07-15 NOTE — Progress Notes (Signed)
RYNELL Wang ([id number]) , Visit Report for 07/02/2020 Chief Complaint Document Details Patient Name: Date of Service: Alan Morton D. 07/02/2020 12:30 PM Medical Record Number: [id number] Patient Account Number: [id number] Date of Birth/Sex: Treating RN: [birth year] (70 y.o. Alan Wang Primary Care Provider: Aura Dials Other Clinician: Referring Provider: Treating Provider/Extender: Shawna Clamp in Treatment: 18 Information Obtained from: Patient Chief Complaint Wound exam; patient is here for review of his skin condition in his coccyx area and surrounding buttock Electronic Signature(s) Signed: 07/02/2020 1:31:54 PM By: Kalman Shan DO Entered By: Kalman Shan on 07/02/2020 13:19:28 -------------------------------------------------------------------------------- HPI Details Patient Name: Date of Service: Alan Wang, Alan Pray D. 07/02/2020 12:30 PM Medical Record Number: [id number] Patient Account Number: [id number] Date of Birth/Sex: Treating RN: April 20, 1950 (70 y.o. Alan Wang Primary Care Provider: Aura Dials Other Clinician: Referring Provider: Treating Provider/Extender: Shawna Clamp in Treatment: 18 History of Present Illness HPI Description: ADMISSION 02/21/2020 This is a 70 year old man with type 2 diabetes well controlled. For the last 3 months he has had a difficult painful/burning area in his coccyx extending between the gluteal cleft. He went to see his primary doctor who probably prescribed nystatin powder although they never managed to pick this up. They have been using something called [place] salve and paradoxically the areas gotten a lot better. There are small open areas but apparently one point these were quite a bit larger. Past medical history includes coronary artery disease, type 2 diabetes,o Rheumatoid arthritis, history of skin cancer in his left upper arm, asthma, CVA 12/23 he  arrives in clinic not receiving any supplies from [place]. This is apparently tangled up and [employer]. I also ordered ketoconazole for him for the remaining intertrigo however he does not seem to have gotten this either. Readmission: 04/16/2020 upon evaluation today patient appears for reevaluation here in the clinic although he was last seen on December 16 by Dr. Dellia Nims. At that time it was thought he may have a fungal type infection he did actually have ketoconazole cream prescribed to him. He did get this cream and has been using it according to the patient and his wife. Unfortunately though is not having as much drainage as he did he does still have a rash that is very irritating around the sacral and perianal region. There does not appear to be any signs of active infection at this time which is great news. 05/14/2020 upon evaluation today patient appears to be doing well with regard to his wound currently. He has been tolerating the dressing changes without complication. Fortunately I think that he is better than last time I saw him. I feel that the nystatin powder is helping. With that being said I do believe that he is going require some slight changes in his treatment regimen to try to get things under better control here. 05/28/2020 on evaluation today patient appears to be doing well with regard to the midline sacral region. There does not appear to be any evidence of infection which is great news. In fact this appears to be closed but he has a wound over on the side more to the right that is open currently. Fortunately there is no evidence of active infection at this time which is great news. I do not believe he needs the nystatin powder any longer. 06/11/2020 on evaluation today patient's wounds actually are showing signs of good granulation epithelization on the left side now the right side is open. His  wife tells me yesterday the right side was not open so this is a change even since that time.  Fortunately there is no signs of active infection at this time. No fever chills noted 07/02/2020 patient presents for 3-week follow-up. They have been using silver alginate every other day to the wound on the coccyx and buttocks. Patient reports minimal improvement to the area. He does report that he sits most of the day and watches TV. He has been doing this since the pandemic started. Electronic Signature(s) Signed: 07/02/2020 1:31:54 PM By: Kalman Shan DO Entered By: Kalman Shan on 07/02/2020 13:22:36 -------------------------------------------------------------------------------- Physical Exam Details Patient Name: Date of Service: Alan Morton D. 07/02/2020 12:30 PM Medical Record Number: [id number] Patient Account Number: [id number] Date of Birth/Sex: Treating RN: April 10, 1950 (70 y.o. Alan Wang Primary Care Provider: Aura Dials Other Clinician: Referring Provider: Treating Provider/Extender: Shawna Clamp in Treatment: 18 Constitutional respirations regular, non-labored and within target range for patient.[name] Psychiatric pleasant and cooperative. Notes Sacrum: Patient has scattered areas of skin breakdown to the coccyx and buttocks region. No signs of infection. No induration noted. No depth to these wounds. No signs of an abscess. Electronic Signature(s) Signed: 07/02/2020 1:31:54 PM By: Kalman Shan DO Entered By: Kalman Shan on 07/02/2020 13:24:20 -------------------------------------------------------------------------------- Physician Orders Details Patient Name: Date of Service: Alan Wang, Alan Pray D. 07/02/2020 12:30 PM Medical Record Number: [id number] Patient Account Number: [id number] Date of Birth/Sex: Treating RN: 11/30/1950 (70 y.o. Alan Wang Primary Care Provider: Aura Dials Other Clinician: Referring Provider: Treating Provider/Extender: Shawna Clamp in Treatment: 39 Verbal  / Phone Orders: No Diagnosis Coding ICD-10 Coding Code Description L98.498 Non-pressure chronic ulcer of skin of other sites with other specified severity Follow-up Appointments ppointment in 1 week. - with Dr. Heber [place] Return A Bathing/ Shower/ Hygiene May shower and wash wound with soap and water. Off-Loading Turn and reposition every 2 hours - while laying down. ensure while sitting to adjust/move to offload pressure to affected area. Stand at least once every hour while awake. Wound Treatment Wound #1 - Coccyx Cleanser: Soap and Water 1 x Per Day/30 Days Discharge Instructions: May shower and wash wound with dial antibacterial soap and water prior to dressing change. Cleanser: Soap and Water 1 x Per Day/30 Days Discharge Instructions: May shower and wash wound with dial antibacterial soap and water prior to dressing change. Topical: Vaseline White Petroleum Jelly, 1 (oz) 1 x Per Day/30 Days Discharge Instructions: apply thin layer daily and after using the bathroom (use thin layer of zinc oxide in clinic) Secondary Dressing: ABD Pad, 5x9 (DME) (Generic) 1 x Per Day/30 Days Discharge Instructions: Apply over primary dressing as directed. Secured With: 44M Medipore H Soft Cloth Surgical T 4 x 2 (in/yd) 1 x Per Day/30 Days ape Discharge Instructions: Secure dressing with tape as directed. Secured With: Hy-Tape, The Original Pink Tape, Adhesive Patch, 4x4 (in/in) (DME) (Generic) 1 x Per Day/30 Days Wound #2 - Gluteus Wound Laterality: Right Cleanser: Soap and Water 1 x Per Day/30 Days Discharge Instructions: May shower and wash wound with dial antibacterial soap and water prior to dressing change. Cleanser: Soap and Water 1 x Per Day/30 Days Discharge Instructions: May shower and wash wound with dial antibacterial soap and water prior to dressing change. Topical: Vaseline White Petroleum Jelly, 1 (oz) 1 x Per Day/30 Days Discharge Instructions: apply thin layer daily and after using  the bathroom (use thin layer of zinc oxide in  clinic) Secondary Dressing: ABD Pad, 5x9 1 x Per Day/30 Days Discharge Instructions: Apply over primary dressing as directed. Secured With: 51M Medipore H Soft Cloth Surgical T 4 x 2 (in/yd) 1 x Per Day/30 Days ape Discharge Instructions: Secure dressing with tape as directed. Electronic Signature(s) Signed: 07/02/2020 5:51:39 PM By: Baruch Gouty RN, BSN Signed: 07/15/2020 3:23:56 PM By: Kalman Shan DO Previous Signature: 07/02/2020 1:31:54 PM Version By: Kalman Shan DO Entered By: Baruch Gouty on 07/02/2020 17:28:24 -------------------------------------------------------------------------------- Problem List Details Patient Name: Date of Service: Alan Wang, Alan Pray D. 07/02/2020 12:30 PM Medical Record Number: [id number] Patient Account Number: [id number] Date of Birth/Sex: Treating RN: 1950-04-04 (70 y.o. Alan Wang Primary Care Provider: Aura Dials Other Clinician: Referring Provider: Treating Provider/Extender: Shawna Clamp in Treatment: 18 Active Problems ICD-10 Encounter Code Description Active Date MDM Diagnosis L98.498 Non-pressure chronic ulcer of skin of other sites with other specified severity 02/21/2020 No Yes Inactive Problems ICD-10 Code Description Active Date Inactive Date B35.6 Tinea cruris 02/21/2020 02/21/2020 Resolved Problems Electronic Signature(s) Signed: 07/02/2020 1:31:54 PM By: Kalman Shan DO Entered By: Kalman Shan on 07/02/2020 13:19:11 -------------------------------------------------------------------------------- Progress Note Details Patient Name: Date of Service: Alan Morton D. 07/02/2020 12:30 PM Medical Record Number: [id number] Patient Account Number: [id number] Date of Birth/Sex: Treating RN: [birth year] (70 y.o. Alan Wang Primary Care Provider: Aura Dials Other Clinician: Referring Provider: Treating Provider/Extender:  Shawna Clamp in Treatment: 18 Subjective Chief Complaint Information obtained from Patient Wound exam; patient is here for review of his skin condition in his coccyx area and surrounding buttock History of Present Illness (HPI) ADMISSION 02/21/2020 This is a 70 year old man with type 2 diabetes well controlled. For the last 3 months he has had a difficult painful/burning area in his coccyx extending between the gluteal cleft. He went to see his primary doctor who probably prescribed nystatin powder although they never managed to pick this up. They have been using something called [place] salve and paradoxically the areas gotten a lot better. There are small open areas but apparently one point these were quite a bit larger. Past medical history includes coronary artery disease, type 2 diabetes,o Rheumatoid arthritis, history of skin cancer in his left upper arm, asthma, CVA 12/23 he arrives in clinic not receiving any supplies from [place]. This is apparently tangled up and [employer]. I also ordered ketoconazole for him for the remaining intertrigo however he does not seem to have gotten this either. Readmission: 04/16/2020 upon evaluation today patient appears for reevaluation here in the clinic although he was last seen on December 16 by Dr. Dellia Nims. At that time it was thought he may have a fungal type infection he did actually have ketoconazole cream prescribed to him. He did get this cream and has been using it according to the patient and his wife. Unfortunately though is not having as much drainage as he did he does still have a rash that is very irritating around the sacral and perianal region. There does not appear to be any signs of active infection at this time which is great news. 05/14/2020 upon evaluation today patient appears to be doing well with regard to his wound currently. He has been tolerating the dressing changes without complication. Fortunately I think  that he is better than last time I saw him. I feel that the nystatin powder is helping. With that being said I do believe that he is going require some slight changes in his  treatment regimen to try to get things under better control here. 05/28/2020 on evaluation today patient appears to be doing well with regard to the midline sacral region. There does not appear to be any evidence of infection which is great news. In fact this appears to be closed but he has a wound over on the side more to the right that is open currently. Fortunately there is no evidence of active infection at this time which is great news. I do not believe he needs the nystatin powder any longer. 06/11/2020 on evaluation today patient's wounds actually are showing signs of good granulation epithelization on the left side now the right side is open. His wife tells me yesterday the right side was not open so this is a change even since that time. Fortunately there is no signs of active infection at this time. No fever chills noted 07/02/2020 patient presents for 3-week follow-up. They have been using silver alginate every other day to the wound on the coccyx and buttocks. Patient reports minimal improvement to the area. He does report that he sits most of the day and watches TV. He has been doing this since the pandemic started. Patient History Information obtained from Patient. Family History Diabetes - Siblings, Heart Disease - Paternal Grandparents,Maternal Grandparents, Hypertension - Father, No family history of Cancer, Hereditary Spherocytosis, Kidney Disease, Lung Disease, Seizures, Stroke, Thyroid Problems, Tuberculosis. Social History Never smoker, Marital Status - Married, Alcohol Use - Moderate, Drug Use - No History, Caffeine Use - Daily. Medical History Eyes Denies history of Cataracts, Glaucoma, Optic Neuritis Ear/Nose/Mouth/Throat Denies history of Chronic sinus problems/congestion, Middle ear  problems Hematologic/Lymphatic Denies history of Anemia, Hemophilia, Human Immunodeficiency Virus, Lymphedema, Sickle Cell Disease Respiratory Patient has history of Asthma Denies history of Aspiration, Chronic Obstructive Pulmonary Disease (COPD), Pneumothorax, Sleep Apnea, Tuberculosis Cardiovascular Patient has history of Coronary Artery Disease, Hypertension Denies history of Angina, Arrhythmia, Congestive Heart Failure, Deep Vein Thrombosis, Hypotension, Myocardial Infarction, Peripheral Arterial Disease, Peripheral Venous Disease, Phlebitis, Vasculitis Gastrointestinal Denies history of Cirrhosis , Colitis, Crohnoos, Hepatitis A, Hepatitis B, Hepatitis C Endocrine Patient has history of Type II Diabetes Denies history of Type I Diabetes Genitourinary Denies history of End Stage Renal Disease Immunological Denies history of Lupus Erythematosus, Raynaudoos, Scleroderma Integumentary (Skin) Denies history of History of Burn Musculoskeletal Denies history of Gout, Rheumatoid Arthritis, Osteoarthritis, Osteomyelitis Neurologic Denies history of Dementia, Neuropathy, Quadriplegia, Paraplegia, Seizure Disorder Oncologic Denies history of Received Chemotherapy, Received Radiation Psychiatric Denies history of Anorexia/bulimia, Confinement Anxiety Objective Constitutional respirations regular, non-labored and within target range for patient.. Vitals Time Taken: 12:42 PM, Height: 70 in, Weight: 204 lbs, BMI: 29.3, Temperature: 98.4 F, Pulse: 71 bpm, Respiratory Rate: 16 breaths/min, Blood Pressure: 135/75 mmHg, Capillary Blood Glucose: 94 mg/dl. General Notes: glucose per pt report Psychiatric pleasant and cooperative. General Notes: Sacrum: Patient has scattered areas of skin breakdown to the coccyx and buttocks region. No signs of infection. No induration noted. No depth to these wounds. No signs of an abscess. Integumentary (Hair, Skin) Wound #1 status is Open. Original  cause of wound was Gradually Appeared. The date acquired was: 12/10/2019. The wound has been in treatment 18 weeks. The wound is located on the Coccyx. The wound measures 0.3cm length x 0.2cm width x 0.1cm depth; 0.047cm^2 area and 0.005cm^3 volume. There is no tunneling or undermining noted. There is a medium amount of serosanguineous drainage noted. The wound margin is indistinct and nonvisible. There is large (67- 100%) pink granulation within the  wound bed. There is no necrotic tissue within the wound bed. Wound #2 status is Open. Original cause of wound was Gradually Appeared. The date acquired was: 06/06/2020. The wound has been in treatment 3 weeks. The wound is located on the Right Gluteus. The wound measures 0.4cm length x 1cm width x 0.1cm depth; 0.314cm^2 area and 0.031cm^3 volume. There is Fat Layer (Subcutaneous Tissue) exposed. There is a medium amount of serosanguineous drainage noted. The wound margin is flat and intact. There is large (67- 100%) pink granulation within the wound bed. There is no necrotic tissue within the wound bed. Assessment Active Problems ICD-10 Non-pressure chronic ulcer of skin of other sites with other specified severity Patient's wound to his coccyx and right buttocks are limited to skin breakdown. I suspect these wounds were created due to moisture trapping and sitting for long periods of time. I would like to go to the basics and have him use a barrier cream and an ABD pad to be changed daily and as needed. We discussed how pressure is creating an issue for wound healing and the importance of offloading. I will see him back in a week. Plan Follow-up Appointments: Return Appointment in 1 week. - with Dr. Heber [place] Bathing/ Shower/ Hygiene: May shower and wash wound with soap and water. Off-Loading: Turn and reposition every 2 hours - while laying down. ensure while sitting to adjust/move to offload pressure to affected area. Stand at least once every  hour while awake. WOUND #1: - Coccyx Wound Laterality: Cleanser: Soap and Water 1 x Per Day/30 Days Discharge Instructions: May shower and wash wound with dial antibacterial soap and water prior to dressing change. Cleanser: Soap and Water 1 x Per Day/30 Days Discharge Instructions: May shower and wash wound with dial antibacterial soap and water prior to dressing change. Topical: Vaseline White Petroleum Jelly, 1 (oz) 1 x Per Day/30 Days Discharge Instructions: apply thin layer daily and after using the bathroom (use thin layer of zinc oxide in clinic) Secondary Dressing: ABD Pad, 5x9 1 x Per Day/30 Days Discharge Instructions: Apply over primary dressing as directed. Secured With: 32M Medipore H Soft Cloth Surgical T 4 x 2 (in/yd) 1 x Per Day/30 Days ape Discharge Instructions: Secure dressing with tape as directed. WOUND #2: - Gluteus Wound Laterality: Right Cleanser: Soap and Water 1 x Per Day/30 Days Discharge Instructions: May shower and wash wound with dial antibacterial soap and water prior to dressing change. Cleanser: Soap and Water 1 x Per Day/30 Days Discharge Instructions: May shower and wash wound with dial antibacterial soap and water prior to dressing change. Topical: Vaseline White Petroleum Jelly, 1 (oz) 1 x Per Day/30 Days Discharge Instructions: apply thin layer daily and after using the bathroom (use thin layer of zinc oxide in clinic) Secondary Dressing: ABD Pad, 5x9 1 x Per Day/30 Days Discharge Instructions: Apply over primary dressing as directed. Secured With: 32M Medipore H Soft Cloth Surgical T 4 x 2 (in/yd) 1 x Per Day/30 Days ape Discharge Instructions: Secure dressing with tape as directed. 1. Vaseline daily and as needed covered with an ABD pad 2. Offloading 3. Follow-up in 1 week Electronic Signature(s) Signed: 07/02/2020 1:31:54 PM By: Kalman Shan DO Entered By: Kalman Shan on 07/02/2020  13:30:26 -------------------------------------------------------------------------------- HxROS Details Patient Name: Date of Service: Alan Wang, Alan Pray D. 07/02/2020 12:30 PM Medical Record Number: [id number] Patient Account Number: [id number] Date of Birth/Sex: Treating RN: 01/31/1951 (70 y.o. Alan Wang Primary Care Provider: Aura Dials Other  Clinician: Referring Provider: Treating Provider/Extender: Shawna Clamp in Treatment: 18 Information Obtained From Patient Eyes Medical History: Negative for: Cataracts; Glaucoma; Optic Neuritis Ear/Nose/Mouth/Throat Medical History: Negative for: Chronic sinus problems/congestion; Middle ear problems Hematologic/Lymphatic Medical History: Negative for: Anemia; Hemophilia; Human Immunodeficiency Virus; Lymphedema; Sickle Cell Disease Respiratory Medical History: Positive for: Asthma Negative for: Aspiration; Chronic Obstructive Pulmonary Disease (COPD); Pneumothorax; Sleep Apnea; Tuberculosis Cardiovascular Medical History: Positive for: Coronary Artery Disease; Hypertension Negative for: Angina; Arrhythmia; Congestive Heart Failure; Deep Vein Thrombosis; Hypotension; Myocardial Infarction; Peripheral Arterial Disease; Peripheral Venous Disease; Phlebitis; Vasculitis Gastrointestinal Medical History: Negative for: Cirrhosis ; Colitis; Crohns; Hepatitis A; Hepatitis B; Hepatitis C Endocrine Medical History: Positive for: Type II Diabetes Negative for: Type I Diabetes Time with diabetes: 64 Treated with: Insulin, Oral agents Blood sugar tested every day: No Genitourinary Medical History: Negative for: End Stage Renal Disease Immunological Medical History: Negative for: Lupus Erythematosus; Raynauds; Scleroderma Integumentary (Skin) Medical History: Negative for: History of Burn Musculoskeletal Medical History: Negative for: Gout; Rheumatoid Arthritis; Osteoarthritis;  Osteomyelitis Neurologic Medical History: Negative for: Dementia; Neuropathy; Quadriplegia; Paraplegia; Seizure Disorder Oncologic Medical History: Negative for: Received Chemotherapy; Received Radiation Psychiatric Medical History: Negative for: Anorexia/bulimia; Confinement Anxiety Immunizations Pneumococcal Vaccine: Received Pneumococcal Vaccination: No Implantable Devices None Family and Social History Cancer: No; Diabetes: Yes - Siblings; Heart Disease: Yes - Paternal Grandparents,Maternal Grandparents; Hereditary Spherocytosis: No; Hypertension: Yes - Father; Kidney Disease: No; Lung Disease: No; Seizures: No; Stroke: No; Thyroid Problems: No; Tuberculosis: No; Never smoker; Marital Status - Married; Alcohol Use: Moderate; Drug Use: No History; Caffeine Use: Daily; Financial Concerns: No; Food, Clothing or Shelter Needs: No; Support System Lacking: No; Transportation Concerns: No Electronic Signature(s) Signed: 07/02/2020 1:31:54 PM By: Kalman Shan DO Signed: 07/02/2020 5:51:39 PM By: Baruch Gouty RN, BSN Entered By: Kalman Shan on 07/02/2020 13:22:44 -------------------------------------------------------------------------------- [place] Details Patient Name: Date of Service: Alan Wang, Alan Pray D. 07/02/2020 Medical Record Number: [id number] Patient Account Number: [id number] Date of Birth/Sex: Treating RN: 1951/01/12 (70 y.o. Alan Wang Primary Care Provider: Aura Dials Other Clinician: Referring Provider: Treating Provider/Extender: Shawna Clamp in Treatment: 18 Diagnosis Coding ICD-10 Codes Code Description [phone] Non-pressure chronic ulcer of skin of other sites with other specified severity Facility Procedures CPT4 Code: [id number] Description: [place] [facility] VISIT-LEV 3 EST PT Modifier: Quantity: 1 Electronic Signature(s) Signed: 07/02/2020 1:31:54 PM By: Kalman Shan DO Entered By: Kalman Shan on  07/02/2020 13:31:12

## 2020-07-31 DIAGNOSIS — Z20822 Contact with and (suspected) exposure to covid-19: Secondary | ICD-10-CM | POA: Diagnosis not present

## 2020-08-02 DIAGNOSIS — I10 Essential (primary) hypertension: Secondary | ICD-10-CM | POA: Diagnosis not present

## 2020-08-02 DIAGNOSIS — N401 Enlarged prostate with lower urinary tract symptoms: Secondary | ICD-10-CM | POA: Diagnosis not present

## 2020-08-02 DIAGNOSIS — M150 Primary generalized (osteo)arthritis: Secondary | ICD-10-CM | POA: Diagnosis not present

## 2020-08-02 DIAGNOSIS — M179 Osteoarthritis of knee, unspecified: Secondary | ICD-10-CM | POA: Diagnosis not present

## 2020-08-02 DIAGNOSIS — E785 Hyperlipidemia, unspecified: Secondary | ICD-10-CM | POA: Diagnosis not present

## 2020-08-02 DIAGNOSIS — E119 Type 2 diabetes mellitus without complications: Secondary | ICD-10-CM | POA: Diagnosis not present

## 2020-08-02 DIAGNOSIS — M19049 Primary osteoarthritis, unspecified hand: Secondary | ICD-10-CM | POA: Diagnosis not present

## 2020-08-02 DIAGNOSIS — I252 Old myocardial infarction: Secondary | ICD-10-CM | POA: Diagnosis not present

## 2020-08-02 DIAGNOSIS — I2510 Atherosclerotic heart disease of native coronary artery without angina pectoris: Secondary | ICD-10-CM | POA: Diagnosis not present

## 2020-08-18 DIAGNOSIS — M150 Primary generalized (osteo)arthritis: Secondary | ICD-10-CM | POA: Diagnosis not present

## 2020-08-18 DIAGNOSIS — E119 Type 2 diabetes mellitus without complications: Secondary | ICD-10-CM | POA: Diagnosis not present

## 2020-08-18 DIAGNOSIS — N400 Enlarged prostate without lower urinary tract symptoms: Secondary | ICD-10-CM | POA: Diagnosis not present

## 2020-08-18 DIAGNOSIS — I2510 Atherosclerotic heart disease of native coronary artery without angina pectoris: Secondary | ICD-10-CM | POA: Diagnosis not present

## 2020-08-18 DIAGNOSIS — E785 Hyperlipidemia, unspecified: Secondary | ICD-10-CM | POA: Diagnosis not present

## 2020-08-18 DIAGNOSIS — I10 Essential (primary) hypertension: Secondary | ICD-10-CM | POA: Diagnosis not present

## 2020-08-18 DIAGNOSIS — M179 Osteoarthritis of knee, unspecified: Secondary | ICD-10-CM | POA: Diagnosis not present

## 2020-08-18 DIAGNOSIS — M19049 Primary osteoarthritis, unspecified hand: Secondary | ICD-10-CM | POA: Diagnosis not present

## 2020-08-18 DIAGNOSIS — I252 Old myocardial infarction: Secondary | ICD-10-CM | POA: Diagnosis not present

## 2020-09-09 DIAGNOSIS — G609 Hereditary and idiopathic neuropathy, unspecified: Secondary | ICD-10-CM | POA: Diagnosis not present

## 2020-09-09 DIAGNOSIS — E7800 Pure hypercholesterolemia, unspecified: Secondary | ICD-10-CM | POA: Diagnosis not present

## 2020-09-09 DIAGNOSIS — E11319 Type 2 diabetes mellitus with unspecified diabetic retinopathy without macular edema: Secondary | ICD-10-CM | POA: Diagnosis not present

## 2020-09-09 DIAGNOSIS — R42 Dizziness and giddiness: Secondary | ICD-10-CM | POA: Diagnosis not present

## 2020-09-09 DIAGNOSIS — E1165 Type 2 diabetes mellitus with hyperglycemia: Secondary | ICD-10-CM | POA: Diagnosis not present

## 2020-09-09 DIAGNOSIS — I10 Essential (primary) hypertension: Secondary | ICD-10-CM | POA: Diagnosis not present

## 2020-09-16 ENCOUNTER — Other Ambulatory Visit: Payer: Self-pay

## 2020-09-16 MED ORDER — SULFASALAZINE 500 MG PO TABS: ORAL_TABLET | ORAL | 0 refills | Status: DC

## 2020-09-16 NOTE — Telephone Encounter (Signed)
Patient's wife Vaughan Basta called requesting prescription refill of Sulfasalazine (90 day supply) to be sent to [facility] at [address].

## 2020-09-16 NOTE — Telephone Encounter (Signed)
Next Visit: 10/21/2020  Last Visit: 07/10/2020  Last Fill: 06/24/2020  DX: Inflammatory arthritis  Current Dose per office note 07/10/5020: sulfasalazine 500 mg 2 tablets twice daily  Labs: 07/10/2020, sulfasalazine 500 mg 2 tablets twice daily  Okay to refill SSZ?

## 2020-10-07 NOTE — Progress Notes (Signed)
Office Visit Note  Patient: Alan Wang             Date of Birth: [birth year]           MRN: [id number]             PCP: Aura Dials, MD Referring: Aura Dials, MD Visit Date: 10/21/2020 Occupation: '@GUAROCC'$ @  Subjective:  Pain in multiple joints.   History of Present Illness: Alan Wang is a 70 y.o. male with history of inflammatory arthritis and osteoarthritis.  He states he has been having increased pain and discomfort in his left knee.  He also notices discomfort in his right thumb.  He states his right thumb triggers.  He has been experiencing increased pain in almost all of his joints.  He states sometimes his right arm hurts.  He has been taking sulfasalazine on a regular basis.  He has not noticed any joint swelling.  Activities of Daily Living:  Patient reports morning stiffness for all day. Patient Reports nocturnal pain.  Difficulty dressing/grooming: Denies Difficulty climbing stairs: Reports Difficulty getting out of chair: Reports Difficulty using hands for taps, buttons, cutlery, and/or writing: Reports  Review of Systems  Constitutional:  Positive for fatigue.  HENT:  Positive for mouth dryness. Negative for mouth sores and nose dryness.   Eyes:  Negative for pain, itching and dryness.  Respiratory:  Negative for shortness of breath and difficulty breathing.   Cardiovascular:  Negative for chest pain and palpitations.  Gastrointestinal:  Negative for blood in stool, constipation and diarrhea.  Endocrine: Negative for increased urination.  Genitourinary:  Negative for difficulty urinating.  Musculoskeletal:  Positive for joint pain, joint pain and morning stiffness. Negative for joint swelling, myalgias, muscle tenderness and myalgias.  Skin:  Negative for color change, rash and redness.  Allergic/Immunologic: Negative for susceptible to infections.  Neurological:  Positive for dizziness and weakness. Negative for numbness, headaches and memory loss.   Hematological:  Positive for bruising/bleeding tendency.  Psychiatric/Behavioral:  Negative for confusion.    PMFS History:  Patient Active Problem List   Diagnosis Date Noted   TIA (transient ischemic attack) 06/22/2016   Type 2 diabetes mellitus without complication ([place]) 123456   Primary osteoarthritis of both hands 06/03/2016   Primary osteoarthritis of both knees 06/03/2016   Generalized pain 06/03/2016   Memory difficulties 06/03/2016   History of diabetes mellitus 06/03/2016   History of coronary artery disease 06/03/2016   History of hypertension 06/03/2016   History of hypercholesterolemia 06/03/2016   High risk medication use 06/03/2016   History of high cholesterol 06/03/2016   ANA positive 06/03/2016   Inflammatory arthritis 10/29/2015   Vitamin D deficiency 10/29/2015   Fatigue 09/24/2015   MGUS (monoclonal gammopathy of unknown significance) 09/22/2015   Vertigo 10/31/2013   Chronic cough 11/16/2012   CAD (coronary artery disease), native coronary artery 03/08/2011   Postsurgical percutaneous transluminal coronary angioplasty (PTCA) status 03/08/2011   Type II or unspecified type diabetes mellitus with other coma, not stated as uncontrolled 03/08/2011   Hypertension associated with diabetes ([place]) 03/08/2011   Shortness of breath 03/08/2011   Mixed hyperlipidemia 03/08/2011    Past Medical History:  Diagnosis Date   Arthritis    Asthma    Coronary artery disease    COVID    Diabetes mellitus    type 2, insulin dependent for 30+ years   History of kidney stones 11/2012   Hyperlipemia    Hypertension    IDDM (insulin  dependent diabetes mellitus)    Shortness of breath    Stroke ([place])    hx TIA    Family History  Problem Relation Age of Onset   Emphysema Mother    COPD Mother    Alzheimer's disease Father    Asthma Paternal Aunt    Heart attack Paternal Grandfather    Past Surgical History:  Procedure Laterality Date   ANGIOPLASTY  04/06/2011    x3    CARDIAC CATHETERIZATION     CARDIAC CATHETERIZATION N/A 09/16/2015   Procedure: Left Heart Cath and Coronary Angiography;  Surgeon: Adrian Prows, MD;  Location: [facility];  Service: Cardiovascular;  Laterality: N/A;   cataracts     CHOLECYSTECTOMY     HERNIA REPAIR     x2   KNEE ARTHROSCOPY     LEFT HEART CATHETERIZATION WITH CORONARY ANGIOGRAM N/A 03/08/2011   Procedure: LEFT HEART CATHETERIZATION WITH CORONARY ANGIOGRAM;  Surgeon: Laverda Page, MD;  Location: [facility];  Service: Cardiovascular;  Laterality: N/A;   PERCUTANEOUS CORONARY STENT INTERVENTION (PCI-S) N/A 04/06/2011   Procedure: PERCUTANEOUS CORONARY STENT INTERVENTION (PCI-S);  Surgeon: Laverda Page, MD;  Location: [facility] CATH LAB;  Service: Cardiovascular;  Laterality: N/A;   Social History   Social History Narrative   Patient lives at home with his spouse.   Caffeine Use: occasionally   Immunization History  Administered Date(s) Administered   PFIZER(Purple Top)SARS-COV-2 Vaccination 04/02/2019, 04/23/2019, 06/17/2020   Pneumococcal Polysaccharide-23 12/14/2011     Objective: Vital Signs: BP 116/73 (BP Location: Left Arm, Patient Position: Sitting, Cuff Size: Normal)   Pulse 73   Ht '5\' 10"'$  (1.778 m)   Wt 207 lb 3.2 oz (94 kg)   BMI 29.73 kg/m    Physical Exam Vitals and nursing note reviewed.  Constitutional:      Appearance: He is well-developed.  HENT:     Head: Normocephalic and atraumatic.  Eyes:     Conjunctiva/sclera: Conjunctivae normal.     Pupils: Pupils are equal, round, and reactive to light.  Cardiovascular:     Rate and Rhythm: Normal rate and regular rhythm.     Heart sounds: Normal heart sounds.  Pulmonary:     Effort: Pulmonary effort is normal.     Breath sounds: Normal breath sounds.  Abdominal:     General: Bowel sounds are normal.     Palpations: Abdomen is soft.  Musculoskeletal:     Cervical back: Normal range of motion and neck supple.  Skin:    General:  Skin is warm and dry.     Capillary Refill: Capillary refill takes less than 2 seconds.  Neurological:     Mental Status: He is alert and oriented to person, place, and time.  Psychiatric:        Behavior: Behavior normal.     Musculoskeletal Exam-C-spine was in good range of motion.  He had discomfort range of motion of his right shoulder joint.  No point tenderness was noted.  Elbow joints and wrist joints with good range of motion.  He had PIP and DIP thickening with no synovitis.  PIP joints and knee joints in good range of motion without any warmth swelling or effusion.  He has some discomfort range of motion of his left knee joint.  There was no tenderness over ankles or MTPs.  CDAI Exam: CDAI Score: -- Patient Global: --; Provider Global: -- Swollen: --; Tender: -- Joint Exam 10/21/2020   No joint exam has been  documented for this visit   There is currently no information documented on the homunculus. Go to the Rheumatology activity and complete the homunculus joint exam.  Investigation: No additional findings.  Imaging: No results found.  Recent Labs: Lab Results  Component Value Date   WBC 7.0 07/10/2020   HGB 15.6 07/10/2020   PLT 201 07/10/2020   NA 143 07/10/2020   K 4.9 07/10/2020   CL 104 07/10/2020   CO2 27 07/10/2020   GLUCOSE 105 (H) 07/10/2020   BUN 17 07/10/2020   CREATININE 0.80 07/10/2020   BILITOT 0.4 07/10/2020   ALKPHOS 76 10/26/2016   AST 20 07/10/2020   ALT 23 07/10/2020   PROT 6.8 07/10/2020   PROT 6.8 07/10/2020   ALBUMIN 4.2 10/26/2016   CALCIUM 9.7 07/10/2020   GFRAA 104 07/10/2020    Speciality Comments: No specialty comments available.  Procedures:  No procedures performed Allergies: Avandia [rosiglitazone maleate]   Assessment / Plan:     Visit Diagnoses: Inflammatory arthritis-he continues to have pain and discomfort in multiple joints.  No synovitis was noted on the examination today.  High risk medication use - He is  taking sulfasalazine 500 mg 2 tablets twice daily.  - Plan: CBC with Differential/Platelet, COMPLETE METABOLIC PANEL WITH GFR today and then every 3 months to monitor for drug toxicity.  He was advised to stop sulfasalazine in case he develops an infection and  restart once infection resolves.  A handout on updated immunization was also placed in the AVS.  Chronic right shoulder pain-he has been having discomfort in his right shoulder with range of motion.  There was no point tenderness.  He possibly had some tendinopathy.  A handout on shoulder exercises was given.  He was referred to physical therapy.  Primary osteoarthritis of both hands-no synovitis was noted.  PIP and DIP thickening consistent with osteoarthritis was noted.  He has possible trigger thumb.  I gave him a handout on hand exercises.  He was referred to physical therapy.  Primary osteoarthritis of both knees-he has longstanding history of osteoarthritis.  He complains of pain and discomfort in the left knee joint.  No synovitis was noted.  A handout on knee joint exercises was given.  He was referred to physical therapy.  History of arthroscopy of left knee  Other chronic pain - He takes tramadol as needed for pain relief.  His PCP has been prescribing tramadol.  According the patient despite being on tramadol the pain is not managed very well.  History of hypercholesterolemia  History of coronary artery disease  History of hypertension  History of vitamin D deficiency  MGUS (monoclonal gammopathy of unknown significance)  History of diabetes mellitus  History of TIA (transient ischemic attack)  Orders: Orders Placed This Encounter  Procedures   CBC with Differential/Platelet   COMPLETE METABOLIC PANEL WITH GFR   Ambulatory referral to Physical Therapy    No orders of the defined types were placed in this encounter.    Follow-Up Instructions: Return in about 5 months (around 03/23/2021) for Inflammatory  arthritis.   Bo Merino, MD  Note - This record has been created using Editor, commissioning.  Chart creation errors have been sought, but may not always  have been located. Such creation errors do not reflect on  the standard of medical care.

## 2020-10-18 ENCOUNTER — Other Ambulatory Visit: Payer: Self-pay | Admitting: Cardiology

## 2020-10-18 DIAGNOSIS — G459 Transient cerebral ischemic attack, unspecified: Secondary | ICD-10-CM

## 2020-10-21 ENCOUNTER — Ambulatory Visit: Payer: Medicare HMO | Admitting: Rheumatology

## 2020-10-21 ENCOUNTER — Encounter: Payer: Self-pay | Admitting: Rheumatology

## 2020-10-21 ENCOUNTER — Other Ambulatory Visit: Payer: Self-pay

## 2020-10-21 VITALS — BP 116/73 | HR 73 | Ht 70.0 in | Wt 207.2 lb

## 2020-10-21 DIAGNOSIS — Z79899 Other long term (current) drug therapy: Secondary | ICD-10-CM

## 2020-10-21 DIAGNOSIS — M170 Bilateral primary osteoarthritis of knee: Secondary | ICD-10-CM

## 2020-10-21 DIAGNOSIS — G8929 Other chronic pain: Secondary | ICD-10-CM

## 2020-10-21 DIAGNOSIS — Z8679 Personal history of other diseases of the circulatory system: Secondary | ICD-10-CM | POA: Diagnosis not present

## 2020-10-21 DIAGNOSIS — D472 Monoclonal gammopathy: Secondary | ICD-10-CM | POA: Diagnosis not present

## 2020-10-21 DIAGNOSIS — M1990 Unspecified osteoarthritis, unspecified site: Secondary | ICD-10-CM | POA: Diagnosis not present

## 2020-10-21 DIAGNOSIS — R52 Pain, unspecified: Secondary | ICD-10-CM

## 2020-10-21 DIAGNOSIS — M25511 Pain in right shoulder: Secondary | ICD-10-CM

## 2020-10-21 DIAGNOSIS — M19041 Primary osteoarthritis, right hand: Secondary | ICD-10-CM | POA: Diagnosis not present

## 2020-10-21 DIAGNOSIS — Z9889 Other specified postprocedural states: Secondary | ICD-10-CM | POA: Diagnosis not present

## 2020-10-21 DIAGNOSIS — Z8639 Personal history of other endocrine, nutritional and metabolic disease: Secondary | ICD-10-CM

## 2020-10-21 DIAGNOSIS — M19042 Primary osteoarthritis, left hand: Secondary | ICD-10-CM

## 2020-10-21 DIAGNOSIS — Z8673 Personal history of transient ischemic attack (TIA), and cerebral infarction without residual deficits: Secondary | ICD-10-CM

## 2020-10-21 LAB — CBC WITH DIFFERENTIAL/PLATELET
Absolute Monocytes: 1030 cells/uL — ABNORMAL HIGH (ref 200–950)
Basophils Absolute: 57 cells/uL (ref 0–200)
Basophils Relative: 0.8 %
Eosinophils Absolute: 99 cells/uL (ref 15–500)
Eosinophils Relative: 1.4 %
HCT: 48.9 % (ref 38.5–50.0)
Hemoglobin: 15.6 g/dL (ref 13.2–17.1)
Lymphs Abs: 3202 cells/uL (ref 850–3900)
MCH: 29.4 pg (ref 27.0–33.0)
MCHC: 31.9 g/dL — ABNORMAL LOW (ref 32.0–36.0)
MCV: 92.1 fL (ref 80.0–100.0)
MPV: 9.4 fL (ref 7.5–12.5)
Monocytes Relative: 14.5 %
Neutro Abs: 2712 cells/uL (ref 1500–7800)
Neutrophils Relative %: 38.2 %
Platelets: 210 10*3/uL (ref 140–400)
RBC: 5.31 10*6/uL (ref 4.20–5.80)
RDW: 13.2 % (ref 11.0–15.0)
Total Lymphocyte: 45.1 %
WBC: 7.1 10*3/uL (ref 3.8–10.8)

## 2020-10-21 LAB — COMPLETE METABOLIC PANEL WITH GFR
AG Ratio: 1.8 (calc) (ref 1.0–2.5)
ALT: 15 U/L (ref 9–46)
AST: 19 U/L (ref 10–35)
Albumin: 4.2 g/dL (ref 3.6–5.1)
Alkaline phosphatase (APISO): 70 U/L (ref 35–144)
BUN: 15 mg/dL (ref 7–25)
CO2: 30 mmol/L (ref 20–32)
Calcium: 9.4 mg/dL (ref 8.6–10.3)
Chloride: 101 mmol/L (ref 98–110)
Creat: 0.86 mg/dL (ref 0.70–1.28)
Globulin: 2.3 g/dL (calc) (ref 1.9–3.7)
Glucose, Bld: 128 mg/dL — ABNORMAL HIGH (ref 65–99)
Potassium: 4.8 mmol/L (ref 3.5–5.3)
Sodium: 138 mmol/L (ref 135–146)
Total Bilirubin: 0.4 mg/dL (ref 0.2–1.2)
Total Protein: 6.5 g/dL (ref 6.1–8.1)
eGFR: 93 mL/min/{1.73_m2} (ref >=60)

## 2020-10-21 NOTE — Patient Instructions (Signed)
Standing Labs We placed an order today for your standing lab work.   Please have your standing labs drawn in November and every 3 months  If possible, please have your labs drawn 2 weeks prior to your appointment so that the provider can discuss your results at your appointment.  Please note that you may see your imaging and lab results in [place] before we have reviewed them. We may be awaiting multiple results to interpret others before contacting you. Please allow our office up to 72 hours to thoroughly review all of the results before contacting the office for clarification of your results.  We have open lab daily: Monday through Thursday from 1:30-4:30 PM and Friday from 1:30-4:00 PM at the office of Dr. Bo Merino, [facility].   Please be advised, all patients with office appointments requiring lab work will take precedent over walk-in lab work.  If possible, please come for your lab work on Monday and Friday afternoons, as you may experience shorter wait times. The office is located at [address] [place], [place], [place] [address] No appointment is necessary.   Labs are drawn by [facility]. Please bring your co-pay at the time of your lab draw.  You may receive a bill from [facility] for your lab work.  If you wish to have your labs drawn at another location, please call the office 24 hours in advance to send orders.  If you have any questions regarding directions or hours of operation,  please call [phone].   As a reminder, please drink plenty of water prior to coming for your lab work. Thanks!   Vaccines You are taking a medication(s) that can suppress your immune system.  The following immunizations are recommended: Flu annually Covid-19  Td/Tdap (tetanus, diphtheria, pertussis) every 10 years Pneumonia (Prevnar 15 then Pneumovax 23 at least 1 year apart.  Alternatively, can take Prevnar 20 without needing additional dose) Shingrix (after age 41): 2  doses from 4 weeks to 6 months apart  Please check with your PCP to make sure you are up to date.   If you have signs or symptoms of an infection or start antibiotics: First, call your PCP for workup of your infection. Hold your medication through the infection, until you complete your antibiotics, and until symptoms resolve if you take the following: Injectable medication (Actemra, Benlysta, Cimzia, Cosentyx, Enbrel, Humira, Kevzara, Orencia, Remicade, Simponi, Stelara, Taltz, Tremfya) Methotrexate Leflunomide (Arava) Mycophenolate (Cellcept) Morrie Sheldon, Olumiant, or Rinvoq  If you test POSITIVE for COVID19 and have MILD to MODERATE symptoms: First, call your PCP if you would like to receive COVID19 treatment AND Hold your medications during the infection and for at least 1 week after your symptoms have resolved: Injectable medication (Benlysta, Cimzia, Cosentyx, Enbrel, Humira, Orencia, Remicade, Simponi, Stelara, Taltz, Tremfya) Methotrexate Leflunomide (Arava) Azathioprine Mycophenolate (Cellcept) Roma Kayser, or Rinvoq Otezla If you take Actemra or Kevzara, you DO NOT need to hold these for COVID19 infection.  If you test POSITIVE for COVID19 and have NO symptoms: First, call your PCP if you would like to receive COVID19 treatment AND Hold your medications for at least 10 days after the day that you tested positive Injectable medication (Benlysta, Cimzia, Cosentyx, Enbrel, Humira, Orencia, Remicade, Simponi, Stelara, Taltz, Tremfya) Methotrexate Leflunomide (Arava) Azathioprine Mycophenolate (Cellcept) Roma Kayser, or Rinvoq Otezla If you take Actemra or Kevzara, you DO NOT need to hold these for COVID19 infection.  Hand Exercises Hand exercises can be helpful for almost anyone. These exercises  can strengthen the hands, improve flexibility and movement, and increase blood flow to the hands. These results can make work and daily tasks easier. Hand exercises can be  especially helpful for people who have joint pain from arthritis or have nerve damage from overuse (carpal tunnel syndrome). These exercises can also help people who have injured a hand. Exercises Most of these hand exercises are gentle stretching and motion exercises. It is usually safe to do them often throughout the day. Warming up your hands before exercise may help to reduce stiffness. You can do this with gentle massage orby placing your hands in warm water for 10-15 minutes. It is normal to feel some stretching, pulling, tightness, or mild discomfort as you begin new exercises. This will gradually improve. Stop an exercise right away if you feel sudden, severe pain or your pain gets worse. Ask your healthcare provider which exercises are best for you. Knuckle bend or "claw" fist Stand or sit with your arm, hand, and all five fingers pointed straight up. Make sure to keep your wrist straight during the exercise. Gently bend your fingers down toward your palm until the tips of your fingers are touching the top of your palm. Keep your big knuckle straight and just bend the small knuckles in your fingers. Hold this position for __________ seconds. Straighten (extend) your fingers back to the starting position. Repeat this exercise 5-10 times with each hand. Full finger fist Stand or sit with your arm, hand, and all five fingers pointed straight up. Make sure to keep your wrist straight during the exercise. Gently bend your fingers into your palm until the tips of your fingers are touching the middle of your palm. Hold this position for __________ seconds. Extend your fingers back to the starting position, stretching every joint fully. Repeat this exercise 5-10 times with each hand. Straight fist Stand or sit with your arm, hand, and all five fingers pointed straight up. Make sure to keep your wrist straight during the exercise. Gently bend your fingers at the big knuckle, where your fingers meet  your hand, and the middle knuckle. Keep the knuckle at the tips of your fingers straight and try to touch the bottom of your palm. Hold this position for __________ seconds. Extend your fingers back to the starting position, stretching every joint fully. Repeat this exercise 5-10 times with each hand. Tabletop Stand or sit with your arm, hand, and all five fingers pointed straight up. Make sure to keep your wrist straight during the exercise. Gently bend your fingers at the big knuckle, where your fingers meet your hand, as far down as you can while keeping the small knuckles in your fingers straight. Think of forming a tabletop with your fingers. Hold this position for __________ seconds. Extend your fingers back to the starting position, stretching every joint fully. Repeat this exercise 5-10 times with each hand. Finger spread Place your hand flat on a table with your palm facing down. Make sure your wrist stays straight as you do this exercise. Spread your fingers and thumb apart from each other as far as you can until you feel a gentle stretch. Hold this position for __________ seconds. Bring your fingers and thumb tight together again. Hold this position for __________ seconds. Repeat this exercise 5-10 times with each hand. Making circles Stand or sit with your arm, hand, and all five fingers pointed straight up. Make sure to keep your wrist straight during the exercise. Make a circle by touching the  tip of your thumb to the tip of your index finger. Hold for __________ seconds. Then open your hand wide. Repeat this motion with your thumb and each finger on your hand. Repeat this exercise 5-10 times with each hand. Thumb motion Sit with your forearm resting on a table and your wrist straight. Your thumb should be facing up toward the ceiling. Keep your fingers relaxed as you move your thumb. Lift your thumb up as high as you can toward the ceiling. Hold for __________ seconds. Bend  your thumb across your palm as far as you can, reaching the tip of your thumb for the small finger (pinkie) side of your palm. Hold for __________ seconds. Repeat this exercise 5-10 times with each hand. Grip strengthening  Hold a stress ball or other soft ball in the middle of your hand. Slowly increase the pressure, squeezing the ball as much as you can without causing pain. Think of bringing the tips of your fingers into the middle of your palm. All of your finger joints should bend when doing this exercise. Hold your squeeze for __________ seconds, then relax. Repeat this exercise 5-10 times with each hand. Contact a health care provider if: Your hand pain or discomfort gets much worse when you do an exercise. Your hand pain or discomfort does not improve within 2 hours after you exercise. If you have any of these problems, stop doing these exercises right away. Do not do them again unless your health care provider says that you can. Get help right away if: You develop sudden, severe hand pain or swelling. If this happens, stop doing these exercises right away. Do not do them again unless your health care provider says that you can. This information is not intended to replace advice given to you by your health care provider. Make sure you discuss any questions you have with your healthcare provider. Document Revised: 06/15/2018 Document Reviewed: 02/23/2018 Elsevier Patient Education  [place]. Shoulder Exercises Ask your health care provider which exercises are safe for you. Do exercises exactly as told by your health care provider and adjust them as directed. It is normal to feel mild stretching, pulling, tightness, or discomfort as you do these exercises. Stop right away if you feel sudden pain or your pain gets worse. Do not begin these exercises until told by your health care provider. Stretching exercises External rotation and abduction This exercise is sometimes called  corner stretch. This exercise rotates your arm outward (external rotation) and moves your arm out from your body (abduction). Stand in a doorway with one of your feet slightly in front of the other. This is called a staggered stance. If you cannot reach your forearms to the door frame, stand facing a corner of a room. Choose one of the following positions as told by your health care provider: Place your hands and forearms on the door frame above your head. Place your hands and forearms on the door frame at the height of your head. Place your hands on the door frame at the height of your elbows. Slowly move your weight onto your front foot until you feel a stretch across your chest and in the front of your shoulders. Keep your head and chest upright and keep your abdominal muscles tight. Hold for __________ seconds. To release the stretch, shift your weight to your back foot. Repeat __________ times. Complete this exercise __________ times a day. Extension, standing Stand and hold a broomstick, a cane, or a similar  object behind your back. Your hands should be a little wider than shoulder width apart. Your palms should face away from your back. Keeping your elbows straight and your shoulder muscles relaxed, move the stick away from your body until you feel a stretch in your shoulders (extension). Avoid shrugging your shoulders while you move the stick. Keep your shoulder blades tucked down toward the middle of your back. Hold for __________ seconds. Slowly return to the starting position. Repeat __________ times. Complete this exercise __________ times a day. Range-of-motion exercises Pendulum  Stand near a wall or a surface that you can hold onto for balance. Bend at the waist and let your left / right arm hang straight down. Use your other arm to support you. Keep your back straight and do not lock your knees. Relax your left / right arm and shoulder muscles, and move your hips and your  trunk so your left / right arm swings freely. Your arm should swing because of the motion of your body, not because you are using your arm or shoulder muscles. Keep moving your hips and trunk so your arm swings in the following directions, as told by your health care provider: Side to side. Forward and backward. In clockwise and counterclockwise circles. Continue each motion for __________ seconds, or for as long as told by your health care provider. Slowly return to the starting position. Repeat __________ times. Complete this exercise __________ times a day. Shoulder flexion, standing  Stand and hold a broomstick, a cane, or a similar object. Place your hands a little more than shoulder width apart on the object. Your left / right hand should be palm up, and your other hand should be palm down. Keep your elbow straight and your shoulder muscles relaxed. Push the stick up with your healthy arm to raise your left / right arm in front of your body, and then over your head until you feel a stretch in your shoulder (flexion). Avoid shrugging your shoulder while you raise your arm. Keep your shoulder blade tucked down toward the middle of your back. Hold for __________ seconds. Slowly return to the starting position. Repeat __________ times. Complete this exercise __________ times a day. Shoulder abduction, standing Stand and hold a broomstick, a cane, or a similar object. Place your hands a little more than shoulder width apart on the object. Your left / right hand should be palm up, and your other hand should be palm down. Keep your elbow straight and your shoulder muscles relaxed. Push the object across your body toward your left / right side. Raise your left / right arm to the side of your body (abduction) until you feel a stretch in your shoulder. Do not raise your arm above shoulder height unless your health care provider tells you to do that. If directed, raise your arm over your head. Avoid  shrugging your shoulder while you raise your arm. Keep your shoulder blade tucked down toward the middle of your back. Hold for __________ seconds. Slowly return to the starting position. Repeat __________ times. Complete this exercise __________ times a day. Internal rotation  Place your left / right hand behind your back, palm up. Use your other hand to dangle an exercise band, a towel, or a similar object over your shoulder. Grasp the band with your left / right hand so you are holding on to both ends. Gently pull up on the band until you feel a stretch in the front of your left / right  shoulder. The movement of your arm toward the center of your body is called internal rotation. Avoid shrugging your shoulder while you raise your arm. Keep your shoulder blade tucked down toward the middle of your back. Hold for __________ seconds. Release the stretch by letting go of the band and lowering your hands. Repeat __________ times. Complete this exercise __________ times a day. Strengthening exercises External rotation  Sit in a stable chair without armrests. Secure an exercise band to a stable object at elbow height on your left / right side. Place a soft object, such as a folded towel or a small pillow, between your left / right upper arm and your body to move your elbow about 4 inches (10 cm) away from your side. Hold the end of the exercise band so it is tight and there is no slack. Keeping your elbow pressed against the soft object, slowly move your forearm out, away from your abdomen (external rotation). Keep your body steady so only your forearm moves. Hold for __________ seconds. Slowly return to the starting position. Repeat __________ times. Complete this exercise __________ times a day. Shoulder abduction  Sit in a stable chair without armrests, or stand up. Hold a __________ weight in your left / right hand, or hold an exercise band with both hands. Start with your arms straight  down and your left / right palm facing in, toward your body. Slowly lift your left / right hand out to your side (abduction). Do not lift your hand above shoulder height unless your health care provider tells you that this is safe. Keep your arms straight. Avoid shrugging your shoulder while you do this movement. Keep your shoulder blade tucked down toward the middle of your back. Hold for __________ seconds. Slowly lower your arm, and return to the starting position. Repeat __________ times. Complete this exercise __________ times a day. Shoulder extension Sit in a stable chair without armrests, or stand up. Secure an exercise band to a stable object in front of you so it is at shoulder height. Hold one end of the exercise band in each hand. Your palms should face each other. Straighten your elbows and lift your hands up to shoulder height. Step back, away from the secured end of the exercise band, until the band is tight and there is no slack. Squeeze your shoulder blades together as you pull your hands down to the sides of your thighs (extension). Stop when your hands are straight down by your sides. Do not let your hands go behind your body. Hold for __________ seconds. Slowly return to the starting position. Repeat __________ times. Complete this exercise __________ times a day. Shoulder row Sit in a stable chair without armrests, or stand up. Secure an exercise band to a stable object in front of you so it is at waist height. Hold one end of the exercise band in each hand. Position your palms so that your thumbs are facing the ceiling (neutral position). Bend each of your elbows to a 90-degree angle (right angle) and keep your upper arms at your sides. Step back until the band is tight and there is no slack. Slowly pull your elbows back behind you. Hold for __________ seconds. Slowly return to the starting position. Repeat __________ times. Complete this exercise __________ times a  day. Shoulder press-ups  Sit in a stable chair that has armrests. Sit upright, with your feet flat on the floor. Put your hands on the armrests so your elbows are bent  and your fingers are pointing forward. Your hands should be about even with the sides of your body. Push down on the armrests and use your arms to lift yourself off the chair. Straighten your elbows and lift yourself up as much as you comfortably can. Move your shoulder blades down, and avoid letting your shoulders move up toward your ears. Keep your feet on the ground. As you get stronger, your feet should support less of your body weight as you lift yourself up. Hold for __________ seconds. Slowly lower yourself back into the chair. Repeat __________ times. Complete this exercise __________ times a day. Wall push-ups  Stand so you are facing a stable wall. Your feet should be about one arm-length away from the wall. Lean forward and place your palms on the wall at shoulder height. Keep your feet flat on the floor as you bend your elbows and lean forward toward the wall. Hold for __________ seconds. Straighten your elbows to push yourself back to the starting position. Repeat __________ times. Complete this exercise __________ times a day. This information is not intended to replace advice given to you by your health care provider. Make sure you discuss any questions you have with your healthcare provider. Document Revised: 06/16/2018 Document Reviewed: 03/24/2018 Elsevier Patient Education  2022 [employer] for Nurse Practitioners, 15(4), [phone]. Retrieved December 12, 2017 from http://clinicalkey.com/nursing">  Knee Exercises Ask your health care provider which exercises are safe for you. Do exercises exactly as told by your health care provider and adjust them as directed. It is normal to feel mild stretching, pulling, tightness, or discomfort as you do these exercises. Stop right away if you feel sudden pain or  your pain gets worse. Do not begin these exercises until told by your health care provider. Stretching and range-of-motion exercises These exercises warm up your muscles and joints and improve the movement and flexibility of your knee. These exercises also help to relieve pain andswelling. Knee extension, prone Lie on your abdomen (prone position) on a bed. Place your left / right knee just beyond the edge of the surface so your knee is not on the bed. You can put a towel under your left / right thigh just above your kneecap for comfort. Relax your leg muscles and allow gravity to straighten your knee (extension). You should feel a stretch behind your left / right knee. Hold this position for __________ seconds. Scoot up so your knee is supported between repetitions. Repeat __________ times. Complete this exercise __________ times a day. Knee flexion, active  Lie on your back with both legs straight. If this causes back discomfort, bend your left / right knee so your foot is flat on the floor. Slowly slide your left / right heel back toward your buttocks. Stop when you feel a gentle stretch in the front of your knee or thigh (flexion). Hold this position for __________ seconds. Slowly slide your left / right heel back to the starting position. Repeat __________ times. Complete this exercise __________ times a day. Quadriceps stretch, prone  Lie on your abdomen on a firm surface, such as a bed or padded floor. Bend your left / right knee and hold your ankle. If you cannot reach your ankle or pant leg, loop a belt around your foot and grab the belt instead. Gently pull your heel toward your buttocks. Your knee should not slide out to the side. You should feel a stretch in the front of your thigh and knee (quadriceps). Hold  this position for __________ seconds. Repeat __________ times. Complete this exercise __________ times a day. Hamstring, supine Lie on your back (supine position). Loop a  belt or towel over the ball of your left / right foot. The ball of your foot is on the walking surface, right under your toes. Straighten your left / right knee and slowly pull on the belt to raise your leg until you feel a gentle stretch behind your knee (hamstring). Do not let your knee bend while you do this. Keep your other leg flat on the floor. Hold this position for __________ seconds. Repeat __________ times. Complete this exercise __________ times a day. Strengthening exercises These exercises build strength and endurance in your knee. Endurance is theability to use your muscles for a long time, even after they get tired. Quadriceps, isometric This exercise stretches the muscles in front of your thigh (quadriceps) without moving your knee joint (isometric). Lie on your back with your left / right leg extended and your other knee bent. Put a rolled towel or small pillow under your knee if told by your health care provider. Slowly tense the muscles in the front of your left / right thigh. You should see your kneecap slide up toward your hip or see increased dimpling just above the knee. This motion will push the back of the knee toward the floor. For __________ seconds, hold the muscle as tight as you can without increasing your pain. Relax the muscles slowly and completely. Repeat __________ times. Complete this exercise __________ times a day. Straight leg raises This exercise stretches the muscles in front of your thigh (quadriceps) and the muscles that move your hips (hip flexors). Lie on your back with your left / right leg extended and your other knee bent. Tense the muscles in the front of your left / right thigh. You should see your kneecap slide up or see increased dimpling just above the knee. Your thigh may even shake a bit. Keep these muscles tight as you raise your leg 4-6 inches (10-15 cm) off the floor. Do not let your knee bend. Hold this position for __________  seconds. Keep these muscles tense as you lower your leg. Relax your muscles slowly and completely after each repetition. Repeat __________ times. Complete this exercise __________ times a day. Hamstring, isometric Lie on your back on a firm surface. Bend your left / right knee about __________ degrees. Dig your left / right heel into the surface as if you are trying to pull it toward your buttocks. Tighten the muscles in the back of your thighs (hamstring) to "dig" as hard as you can without increasing any pain. Hold this position for __________ seconds. Release the tension gradually and allow your muscles to relax completely for __________ seconds after each repetition. Repeat __________ times. Complete this exercise __________ times a day. Hamstring curls If told by your health care provider, do this exercise while wearing ankle weights. Begin with __________ lb weights. Then increase the weight by 1 lb (0.5 kg) increments. Do not wear ankle weights that are more than __________ lb. Lie on your abdomen with your legs straight. Bend your left / right knee as far as you can without feeling pain. Keep your hips flat against the floor. Hold this position for __________ seconds. Slowly lower your leg to the starting position. Repeat __________ times. Complete this exercise __________ times a day. Squats This exercise strengthens the muscles in front of your thigh and knee (quadriceps). Stand in front  of a table, with your feet and knees pointing straight ahead. You may rest your hands on the table for balance but not for support. Slowly bend your knees and lower your hips like you are going to sit in a chair. Keep your weight over your heels, not over your toes. Keep your lower legs upright so they are parallel with the table legs. Do not let your hips go lower than your knees. Do not bend lower than told by your health care provider. If your knee pain increases, do not bend as low. Hold the  squat position for __________ seconds. Slowly push with your legs to return to standing. Do not use your hands to pull yourself to standing. Repeat __________ times. Complete this exercise __________ times a day. Wall slides This exercise strengthens the muscles in front of your thigh and knee (quadriceps). Lean your back against a smooth wall or door, and walk your feet out 18-24 inches (46-61 cm) from it. Place your feet hip-width apart. Slowly slide down the wall or door until your knees bend __________ degrees. Keep your knees over your heels, not over your toes. Keep your knees in line with your hips. Hold this position for __________ seconds. Repeat __________ times. Complete this exercise __________ times a day. Straight leg raises This exercise strengthens the muscles that rotate the leg at the hip and move it away from your body (hip abductors). Lie on your side with your left / right leg in the top position. Lie so your head, shoulder, knee, and hip line up. You may bend your bottom knee to help you keep your balance. Roll your hips slightly forward so your hips are stacked directly over each other and your left / right knee is facing forward. Leading with your heel, lift your top leg 4-6 inches (10-15 cm). You should feel the muscles in your outer hip lifting. Do not let your foot drift forward. Do not let your knee roll toward the ceiling. Hold this position for __________ seconds. Slowly return your leg to the starting position. Let your muscles relax completely after each repetition. Repeat __________ times. Complete this exercise __________ times a day. Straight leg raises This exercise stretches the muscles that move your hips away from the front of the pelvis (hip extensors). Lie on your abdomen on a firm surface. You can put a pillow under your hips if that is more comfortable. Tense the muscles in your buttocks and lift your left / right leg about 4-6 inches (10-15 cm).  Keep your knee straight as you lift your leg. Hold this position for __________ seconds. Slowly lower your leg to the starting position. Let your leg relax completely after each repetition. Repeat __________ times. Complete this exercise __________ times a day. This information is not intended to replace advice given to you by your health care provider. Make sure you discuss any questions you have with your healthcare provider. Document Revised: 12/13/2017 Document Reviewed: 12/13/2017 Elsevier Patient Education  2022 [employer].

## 2020-10-22 NOTE — Progress Notes (Signed)
CBC and CMP normal.  Glucose is mildly elevated, probably not a fasting sample.

## 2020-10-30 ENCOUNTER — Other Ambulatory Visit: Payer: Self-pay

## 2020-10-30 DIAGNOSIS — I2510 Atherosclerotic heart disease of native coronary artery without angina pectoris: Secondary | ICD-10-CM

## 2020-10-30 DIAGNOSIS — G459 Transient cerebral ischemic attack, unspecified: Secondary | ICD-10-CM

## 2020-10-30 MED ORDER — OLMESARTAN MEDOXOMIL 20 MG PO TABS: 20.0000 mg | ORAL_TABLET | Freq: Every day | ORAL | 1 refills | Status: DC

## 2020-10-30 MED ORDER — NITROGLYCERIN 0.4 MG SL SUBL: 0.4000 mg | SUBLINGUAL_TABLET | SUBLINGUAL | 3 refills | Status: DC | PRN

## 2020-10-30 MED ORDER — CARVEDILOL 12.5 MG PO TABS: 12.5000 mg | ORAL_TABLET | Freq: Two times a day (BID) | ORAL | 1 refills | Status: DC

## 2020-11-02 ENCOUNTER — Other Ambulatory Visit: Payer: Self-pay | Admitting: Cardiology

## 2020-11-05 DIAGNOSIS — Z794 Long term (current) use of insulin: Secondary | ICD-10-CM | POA: Diagnosis not present

## 2020-11-05 DIAGNOSIS — M150 Primary generalized (osteo)arthritis: Secondary | ICD-10-CM | POA: Diagnosis not present

## 2020-11-05 DIAGNOSIS — I10 Essential (primary) hypertension: Secondary | ICD-10-CM | POA: Diagnosis not present

## 2020-11-05 DIAGNOSIS — M179 Osteoarthritis of knee, unspecified: Secondary | ICD-10-CM | POA: Diagnosis not present

## 2020-11-05 DIAGNOSIS — E119 Type 2 diabetes mellitus without complications: Secondary | ICD-10-CM | POA: Diagnosis not present

## 2020-11-05 DIAGNOSIS — I2510 Atherosclerotic heart disease of native coronary artery without angina pectoris: Secondary | ICD-10-CM | POA: Diagnosis not present

## 2020-11-05 DIAGNOSIS — N400 Enlarged prostate without lower urinary tract symptoms: Secondary | ICD-10-CM | POA: Diagnosis not present

## 2020-11-05 DIAGNOSIS — I252 Old myocardial infarction: Secondary | ICD-10-CM | POA: Diagnosis not present

## 2020-11-05 DIAGNOSIS — N401 Enlarged prostate with lower urinary tract symptoms: Secondary | ICD-10-CM | POA: Diagnosis not present

## 2020-11-05 DIAGNOSIS — M19049 Primary osteoarthritis, unspecified hand: Secondary | ICD-10-CM | POA: Diagnosis not present

## 2020-11-05 DIAGNOSIS — Z23 Encounter for immunization: Secondary | ICD-10-CM | POA: Diagnosis not present

## 2020-11-05 DIAGNOSIS — M2550 Pain in unspecified joint: Secondary | ICD-10-CM | POA: Diagnosis not present

## 2020-11-05 DIAGNOSIS — E785 Hyperlipidemia, unspecified: Secondary | ICD-10-CM | POA: Diagnosis not present

## 2020-11-05 DIAGNOSIS — Z7984 Long term (current) use of oral hypoglycemic drugs: Secondary | ICD-10-CM | POA: Diagnosis not present

## 2020-11-12 ENCOUNTER — Ambulatory Visit: Payer: Medicare HMO | Attending: Family Medicine

## 2020-11-12 ENCOUNTER — Other Ambulatory Visit: Payer: Self-pay

## 2020-11-12 DIAGNOSIS — M25611 Stiffness of right shoulder, not elsewhere classified: Secondary | ICD-10-CM | POA: Insufficient documentation

## 2020-11-12 DIAGNOSIS — G8929 Other chronic pain: Secondary | ICD-10-CM | POA: Diagnosis not present

## 2020-11-12 DIAGNOSIS — M79641 Pain in right hand: Secondary | ICD-10-CM | POA: Diagnosis not present

## 2020-11-12 DIAGNOSIS — M19041 Primary osteoarthritis, right hand: Secondary | ICD-10-CM | POA: Insufficient documentation

## 2020-11-12 DIAGNOSIS — M79642 Pain in left hand: Secondary | ICD-10-CM | POA: Diagnosis not present

## 2020-11-12 DIAGNOSIS — M25612 Stiffness of left shoulder, not elsewhere classified: Secondary | ICD-10-CM | POA: Insufficient documentation

## 2020-11-12 DIAGNOSIS — M25561 Pain in right knee: Secondary | ICD-10-CM | POA: Insufficient documentation

## 2020-11-12 DIAGNOSIS — M25511 Pain in right shoulder: Secondary | ICD-10-CM | POA: Diagnosis not present

## 2020-11-12 DIAGNOSIS — M19042 Primary osteoarthritis, left hand: Secondary | ICD-10-CM | POA: Insufficient documentation

## 2020-11-12 DIAGNOSIS — M25562 Pain in left knee: Secondary | ICD-10-CM | POA: Diagnosis present

## 2020-11-12 DIAGNOSIS — M170 Bilateral primary osteoarthritis of knee: Secondary | ICD-10-CM | POA: Diagnosis not present

## 2020-11-13 NOTE — Therapy (Addendum)
[place], [place], [address] Phone: [phone]   Fax:  [phone]  Physical Therapy Evaluation  Patient Details  Name: Alan Wang MRN: [id number] Date of Birth: 27-Jan-1951 Referring Provider (PT): Bo Merino, MD   Encounter Date: 11/12/2020   PT End of Session - 11/13/20 0619     Visit Number 1    Number of Visits 17    Date for PT Re-Evaluation 01/17/21    Authorization Type AETNA MEDICARE HMO/PPO    Authorization Time Period FOTO reassessments on the 5th and 10th visits    Progress Note Due on Visit 10    PT Start Time 1500    PT Stop Time 1550    PT Time Calculation (min) 50 min    Activity Tolerance Patient tolerated treatment well    Behavior During Therapy [facility] for tasks assessed/performed             Past Medical History:  Diagnosis Date   Arthritis    Asthma    Coronary artery disease    COVID    Diabetes mellitus    type 2, insulin dependent for 30+ years   History of kidney stones 11/2012   Hyperlipemia    Hypertension    IDDM (insulin dependent diabetes mellitus)    Shortness of breath    Stroke ([place])    hx TIA    Past Surgical History:  Procedure Laterality Date   ANGIOPLASTY  04/06/2011   x3    CARDIAC CATHETERIZATION     CARDIAC CATHETERIZATION N/A 09/16/2015   Procedure: Left Heart Cath and Coronary Angiography;  Surgeon: Adrian Prows, MD;  Location: [place] [facility];  Service: Cardiovascular;  Laterality: N/A;   cataracts     CHOLECYSTECTOMY     HERNIA REPAIR     x2   KNEE ARTHROSCOPY     LEFT HEART CATHETERIZATION WITH CORONARY ANGIOGRAM N/A 03/08/2011   Procedure: LEFT HEART CATHETERIZATION WITH CORONARY ANGIOGRAM;  Surgeon: Laverda Page, MD;  Location: [facility] CATH LAB;  Service: Cardiovascular;  Laterality: N/A;   PERCUTANEOUS CORONARY STENT INTERVENTION (PCI-S) N/A 04/06/2011   Procedure: PERCUTANEOUS CORONARY STENT INTERVENTION (PCI-S);  Surgeon:  Laverda Page, MD;  Location: [facility] CATH LAB;  Service: Cardiovascular;  Laterality: N/A;    There were no vitals filed for this visit.    Subjective Assessment - 11/12/20 1519     Subjective Pt reports a gradual increase in his overall multi-joint pain over the past 3 years. The R shoulder and R thunb bother him the most and the R knee pops. The pain is most significant at night. He notes being told he has arthritis all over his body years ago. He states he has been retired for 10 years and durin that time frame his activity level has been low.    How long can you walk comfortably? 5 mins    Diagnostic tests 05/15/19:Xray L knee- Impression: These findings are consistent with moderate osteoarthritis and   moderate chondromalacia patella.    Patient Stated Goals To not hurt as much. To be more steady on my feet. To improve my activity tolerance.    Currently in Pain? Yes    Pain Score 3     Pain Location Generalized    Pain Descriptors / Indicators Aching    Pain Type Chronic pain    Pain Onset More than a month ago    Pain Frequency Constant    Aggravating Factors  At night and in the mornings    Pain Relieving Factors Tramadol                [facility] PT Assessment - 11/13/20 0001       Assessment   Medical Diagnosis Chronic right shoulder pain;Primary osteoarthritis of both knees; Primary osteoarthritis of both hands    Referring Provider (PT) Bo Merino, MD    Onset Date/Surgical Date --   3 years   Hand Dominance Right    Prior Therapy No      Precautions   Precautions None      Restrictions   Weight Bearing Restrictions Yes      Balance Screen   Has the patient fallen in the past 6 months No    Has the patient had a decrease in activity level because of a fear of falling?  No    Is the patient reluctant to leave their home because of a fear of falling?  No      Home Environment   Living Environment Private residence    Living Arrangements Spouse/significant  other    Type of [place] to enter    Entrance Stairs-Number of Steps 4    Entrance Stairs-Rails Right    Home Layout Multi-level    Alternate Level Stairs-Number of Steps 13    Alternate Level Stairs-Rails Right      Prior Function   Level of Independence Independent    Vocation Retired    Leisure Optician, dispensing   Overall Cognitive Status Within Functional Limits for tasks assessed      Observation/Other Assessments   Observations Arhtritic changes boths hands and feet    Focus on Therapeutic Outcomes (FOTO)  50% functional ability      Sensation   Light Touch Appears Intact      Posture/Postural Control   Posture/Postural Control Postural limitations    Postural Limitations Rounded Shoulders;Forward head;Increased thoracic kyphosis      ROM / Strength   AROM / PROM / Strength AROM;Strength      AROM   Overall AROM Comments Shoulder flexion R 120d, L 125d. Pt was able to place hands behind head and low back with R shoulder ROM less than LOther UE and and LE joints were grossly WNLs.      Strength   Overall Strength Comments B shoulders and hands were grossly 4/5 and elbows 4+/5. Grip strength to be assessed. B hips were grossly 4/5 and both knees and ankles 5/5.      Transfers   Transfers Sit to Stand;Stand to Sit    Sit to Stand 7: Independent      Ambulation/Gait   Ambulation/Gait Yes    Ambulation/Gait Assistance 7: Independent    Gait Pattern Within Functional Limits;Step-to pattern                        Objective measurements completed on examination: See above findings.                PT Education - 11/13/20 0618     Education Details Eval findings, POC, the benefits of appropriate ex, walking program    Person(s) Educated Patient    Methods Explanation    Comprehension Verbalized understanding              PT Short Term Goals - 11/13/20 2120       PT SHORT  TERM GOAL #1   Title Pt will  be Ind in an initial HEP    Status New    Target Date 12/04/20      PT SHORT TERM GOAL #2   Title Pt will voice understanding of measures to assist in reducing pain    Status New    Target Date 12/04/20               PT Long Term Goals - 11/13/20 2122       PT LONG TERM GOAL #1   Title Pt will be Ind in a final HEP    Status New    Target Date 01/17/21      PT LONG TERM GOAL #2   Title Increase Bilat shoulder ROM to 130d for improved overhead functional reach    Baseline R 120, L 125    Status New    Target Date 01/17/21      PT LONG TERM GOAL #3   Title Pt will report improved walking tolerance to 20 mins for improved functional activity tolerance    Status New    Target Date 01/17/21      PT LONG TERM GOAL #4   Title Pt will report an improve general body pain level to 2 or less from improved sleep and QOL    Status New    Target Date 01/17/21      PT LONG TERM GOAL #5   Title Add LTGs as appropriate as 5xSTS, 2MTW and Berg are assessed and as well as when specific areas of pain are assessed    Status New    Target Date 01/17/21                    Plan - 11/13/20 [id number]     Clinical Impression Statement Pt presents with a chronic Hx of multi-joint pain with his shoulders (R>L), hands (R>L), knees (L>R) being the most significant. The R shoulder pain was reproduced c active flexion near end range which is limited in ROM. Manual resistance did not reproduce pain. Pt has moderately rounded shoulders c thoracic kyphosis impacting the mechanics of the shoulder complex. Per pt's report his tolerance to activity is decreased. Discussed with pt the benefits of exercise and provided pt with an outline of a walking program. Pt will benefit from PT 2w8 to address deficits to decrease pain and optimize his function.    Personal Factors and Comorbidities Comorbidity 1;Comorbidity 3+;Comorbidity 2    Comorbidities Arthritis, asthma, CAD, IDDM, HTN, SOB, TIA     Examination-Activity Limitations Stairs;Stand;Locomotion Level;Transfers    Examination-Participation Restrictions Other   ADLs   Stability/Clinical Decision Making Stable/Uncomplicated    Clinical Decision Making Low    Rehab Potential Good    PT Frequency 2x / week    PT Duration 8 weeks    PT Treatment/Interventions ADLs/Self Care Home Management;Aquatic Therapy;Cryotherapy;Electrical Stimulation;Iontophoresis '4mg'$ /ml Dexamethasone;Moist Heat;Ultrasound;Balance training;Therapeutic exercise;Therapeutic activities;Functional mobility training;Stair training;Gait training;Patient/family education;Neuromuscular re-education;Manual techniques;Taping;Joint Manipulations;Dry needling    PT Next Visit Plan Assess response of walking program. Initiate shoulder exs to address strength, posture, and ROM. Assess 5xSTS and 2 min walking test. Assess areas of pain and develop POC as appropriate    PT Home Exercise Plan [facility] program    Consulted and Agree with Plan of Care Patient             Patient will benefit from skilled therapeutic intervention in order to improve the following deficits and impairments:  Difficulty walking, Decreased range of motion, Decreased activity tolerance, Impaired UE functional use, Pain, Postural dysfunction, Decreased strength, Decreased balance  Visit Diagnosis: Chronic pain in right shoulder  Primary osteoarthritis of both hands  Primary osteoarthritis of both knees  Decreased ROM of right shoulder  Decreased ROM of left shoulder  Pain in both hands  Chronic pain of both knees     Problem List Patient Active Problem List   Diagnosis Date Noted   TIA (transient ischemic attack) 06/22/2016   Type 2 diabetes mellitus without complication ([place]) 123456   Primary osteoarthritis of both hands 06/03/2016   Primary osteoarthritis of both knees 06/03/2016   Generalized pain 06/03/2016   Memory difficulties 06/03/2016   History of diabetes mellitus  06/03/2016   History of coronary artery disease 06/03/2016   History of hypertension 06/03/2016   History of hypercholesterolemia 06/03/2016   High risk medication use 06/03/2016   History of high cholesterol 06/03/2016   ANA positive 06/03/2016   Inflammatory arthritis 10/29/2015   Vitamin D deficiency 10/29/2015   Fatigue 09/24/2015   MGUS (monoclonal gammopathy of unknown significance) 09/22/2015   Vertigo 10/31/2013   Chronic cough 11/16/2012   CAD (coronary artery disease), native coronary artery 03/08/2011   Postsurgical percutaneous transluminal coronary angioplasty (PTCA) status 03/08/2011   Type II or unspecified type diabetes mellitus with other coma, not stated as uncontrolled 03/08/2011   Hypertension associated with diabetes ([place]) 03/08/2011   Shortness of breath 03/08/2011   Mixed hyperlipidemia 03/08/2011    Gar Ponto, PT 11/13/2020, 9:44 PM  [facility] [address] [place], [place], [address] Phone: [phone]   Fax:  [phone]  Name: Alan Wang MRN: [id number] Date of Birth: 04/07/1950

## 2020-11-27 ENCOUNTER — Other Ambulatory Visit: Payer: Self-pay

## 2020-11-27 ENCOUNTER — Encounter: Payer: Self-pay | Admitting: Physical Therapy

## 2020-11-27 ENCOUNTER — Ambulatory Visit: Payer: Medicare HMO | Admitting: Physical Therapy

## 2020-11-27 DIAGNOSIS — M79641 Pain in right hand: Secondary | ICD-10-CM | POA: Diagnosis not present

## 2020-11-27 DIAGNOSIS — M170 Bilateral primary osteoarthritis of knee: Secondary | ICD-10-CM | POA: Diagnosis not present

## 2020-11-27 DIAGNOSIS — M25561 Pain in right knee: Secondary | ICD-10-CM | POA: Diagnosis not present

## 2020-11-27 DIAGNOSIS — G8929 Other chronic pain: Secondary | ICD-10-CM

## 2020-11-27 DIAGNOSIS — M19042 Primary osteoarthritis, left hand: Secondary | ICD-10-CM

## 2020-11-27 DIAGNOSIS — M25611 Stiffness of right shoulder, not elsewhere classified: Secondary | ICD-10-CM | POA: Diagnosis not present

## 2020-11-27 DIAGNOSIS — M19041 Primary osteoarthritis, right hand: Secondary | ICD-10-CM

## 2020-11-27 DIAGNOSIS — M25562 Pain in left knee: Secondary | ICD-10-CM

## 2020-11-27 DIAGNOSIS — M79642 Pain in left hand: Secondary | ICD-10-CM | POA: Diagnosis not present

## 2020-11-27 DIAGNOSIS — M25612 Stiffness of left shoulder, not elsewhere classified: Secondary | ICD-10-CM | POA: Diagnosis not present

## 2020-11-27 DIAGNOSIS — M25511 Pain in right shoulder: Secondary | ICD-10-CM | POA: Diagnosis not present

## 2020-11-27 NOTE — Therapy (Signed)
[place], [place], [address] Phone: [phone]   Fax:  [phone]  Physical Therapy Treatment  Patient Details  Name: Alan Wang MRN: [id number] Date of Birth: Aug 02, 1950 Referring Provider (PT): Bo Merino, MD   Encounter Date: 11/27/2020   PT End of Session - 11/27/20 0854     Visit Number 2    Number of Visits 17    Date for PT Re-Evaluation 01/17/21    Authorization Type AETNA MEDICARE HMO/PPO    Authorization Time Period FOTO reassessments on the 5th and 10th visits    Progress Note Due on Visit 10    PT Start Time 0849    PT Stop Time 0927    PT Time Calculation (min) 38 min    Activity Tolerance Patient tolerated treatment well    Behavior During Therapy [facility] for tasks assessed/performed             Past Medical History:  Diagnosis Date   Arthritis    Asthma    Coronary artery disease    COVID    Diabetes mellitus    type 2, insulin dependent for 30+ years   History of kidney stones 11/2012   Hyperlipemia    Hypertension    IDDM (insulin dependent diabetes mellitus)    Shortness of breath    Stroke ([place])    hx TIA    Past Surgical History:  Procedure Laterality Date   ANGIOPLASTY  04/06/2011   x3    CARDIAC CATHETERIZATION     CARDIAC CATHETERIZATION N/A 09/16/2015   Procedure: Left Heart Cath and Coronary Angiography;  Surgeon: Adrian Prows, MD;  Location: [place] [facility];  Service: Cardiovascular;  Laterality: N/A;   cataracts     CHOLECYSTECTOMY     HERNIA REPAIR     x2   KNEE ARTHROSCOPY     LEFT HEART CATHETERIZATION WITH CORONARY ANGIOGRAM N/A 03/08/2011   Procedure: LEFT HEART CATHETERIZATION WITH CORONARY ANGIOGRAM;  Surgeon: Laverda Page, MD;  Location: [facility] CATH LAB;  Service: Cardiovascular;  Laterality: N/A;   PERCUTANEOUS CORONARY STENT INTERVENTION (PCI-S) N/A 04/06/2011   Procedure: PERCUTANEOUS CORONARY STENT INTERVENTION (PCI-S);  Surgeon:  Laverda Page, MD;  Location: [facility] CATH LAB;  Service: Cardiovascular;  Laterality: N/A;    There were no vitals filed for this visit.   Subjective Assessment - 11/27/20 0852     Subjective When I hurt all over I dont try to do much.  Today he is not really having much pain.  Has not tried to do any walking.    Limitations House hold activities;Other (comment)   opening jars   Patient Stated Goals To not hurt as much. To be more steady on my feet. To improve my activity tolerance.    Currently in Pain? No/denies                [facility] PT Assessment - 11/27/20 0001       Transfers   Five time sit to stand comments  17 sec no hands from chair      6 minute walk test results    Endurance additional comments 2 min walk test 405 feet , "easy for patient"             Skilled therapy interventions:  Therapeutic Exercise: Nustep L7 UE and LE 5 min  Supine bridging 2 x 10  SLR 2 x 10  Lower trunk rotation between sets Supine shoulder ex:  horizontal pull and then narrow grip  overhead pull , ER/IR x 15 green band     Self care/Pt. Education:   [place]ty, importance of walking, activity, step goals.     PT Short Term Goals - 11/27/20 0854       PT SHORT TERM GOAL #1   Title Pt will be Ind in an initial HEP    Status On-going      PT SHORT TERM GOAL #2   Title Pt will voice understanding of measures to assist in reducing pain    Status On-going               PT Long Term Goals - 11/13/20 2122       PT LONG TERM GOAL #1   Title Pt will be Ind in a final HEP    Status New    Target Date 01/17/21      PT LONG TERM GOAL #2   Title Increase Bilat shoulder ROM to 130d for improved overhead functional reach    Baseline R 120, L 125    Status New    Target Date 01/17/21      PT LONG TERM GOAL #3   Title Pt will report improved walking tolerance to 20 mins for improved functional activity tolerance    Status New    Target Date 01/17/21      PT LONG TERM  GOAL #4   Title Pt will report an improve general body pain level to 2 or less from improved sleep and QOL    Status New    Target Date 01/17/21      PT LONG TERM GOAL #5   Title Add LTGs as appropriate as 5xSTS, 2MTW and Berg are assessed and as well as when specific areas of pain are assessed    Status New    Target Date 01/17/21                   Plan - 11/27/20 0914     Clinical Impression Statement Patient tolerated exercises without complaint of pain .  He admits to not doing his walking program.  He normally does nothing at home when he has pain.  Generally shows good functional strength with sit to stand but could benefit from posterior chain strength, ant hip stretching and trunk rotation.  Given putty for hands, may consider a thumb spica for stability.    PT Treatment/Interventions ADLs/Self Care Home Management;Aquatic Therapy;Cryotherapy;Electrical Stimulation;Iontophoresis 4mg /ml Dexamethasone;Moist Heat;Ultrasound;Balance training;Therapeutic exercise;Therapeutic activities;Functional mobility training;Stair training;Gait training;Patient/family education;Neuromuscular re-education;Manual techniques;Taping;Joint Manipulations;Dry needling    PT Next Visit Plan ?walking program. Initiate shoulder exs to address strength, posture, and ROM.  Assess areas of pain and develop POC as appropriate    PT Home Exercise Plan [place] program [id number]    Consulted and Agree with Plan of Care Patient             Patient will benefit from skilled therapeutic intervention in order to improve the following deficits and impairments:  Difficulty walking, Decreased range of motion, Decreased activity tolerance, Impaired UE functional use, Pain, Postural dysfunction, Decreased strength, Decreased balance  Visit Diagnosis: Chronic pain in right shoulder  Primary osteoarthritis of both hands  Primary osteoarthritis of both knees  Decreased ROM of right shoulder  Decreased ROM of  left shoulder  Pain in both hands  Chronic pain of both knees     Problem List Patient Active Problem List   Diagnosis Date Noted   TIA (  transient ischemic attack) 06/22/2016   Type 2 diabetes mellitus without complication ([place]) 91/66/0600   Primary osteoarthritis of both hands 06/03/2016   Primary osteoarthritis of both knees 06/03/2016   Generalized pain 06/03/2016   Memory difficulties 06/03/2016   History of diabetes mellitus 06/03/2016   History of coronary artery disease 06/03/2016   History of hypertension 06/03/2016   History of hypercholesterolemia 06/03/2016   High risk medication use 06/03/2016   History of high cholesterol 06/03/2016   ANA positive 06/03/2016   Inflammatory arthritis 10/29/2015   Vitamin D deficiency 10/29/2015   Fatigue 09/24/2015   MGUS (monoclonal gammopathy of unknown significance) 09/22/2015   Vertigo 10/31/2013   Chronic cough 11/16/2012   CAD (coronary artery disease), native coronary artery 03/08/2011   Postsurgical percutaneous transluminal coronary angioplasty (PTCA) status 03/08/2011   Type II or unspecified type diabetes mellitus with other coma, not stated as uncontrolled 03/08/2011   Hypertension associated with diabetes ([place]) 03/08/2011   Shortness of breath 03/08/2011   Mixed hyperlipidemia 03/08/2011    Yeslin Delio, PT 11/27/2020, 10:08 AM  [place], [place], [address] Phone: [phone]   Fax:  [phone]  Name: Alan Wang MRN: [id number] Date of Birth: 09/29/1950   Raeford Razor, PT 11/27/20 10:09 AM Phone: [phone] Fax: [phone]

## 2020-12-01 ENCOUNTER — Encounter: Payer: Self-pay | Admitting: Physical Therapy

## 2020-12-01 ENCOUNTER — Other Ambulatory Visit: Payer: Self-pay

## 2020-12-01 ENCOUNTER — Ambulatory Visit: Payer: Medicare HMO | Admitting: Physical Therapy

## 2020-12-01 DIAGNOSIS — G8929 Other chronic pain: Secondary | ICD-10-CM | POA: Diagnosis not present

## 2020-12-01 DIAGNOSIS — M79642 Pain in left hand: Secondary | ICD-10-CM

## 2020-12-01 DIAGNOSIS — M25611 Stiffness of right shoulder, not elsewhere classified: Secondary | ICD-10-CM

## 2020-12-01 DIAGNOSIS — M19042 Primary osteoarthritis, left hand: Secondary | ICD-10-CM

## 2020-12-01 DIAGNOSIS — M19041 Primary osteoarthritis, right hand: Secondary | ICD-10-CM

## 2020-12-01 DIAGNOSIS — M25511 Pain in right shoulder: Secondary | ICD-10-CM | POA: Diagnosis not present

## 2020-12-01 DIAGNOSIS — M79641 Pain in right hand: Secondary | ICD-10-CM | POA: Diagnosis not present

## 2020-12-01 DIAGNOSIS — M25612 Stiffness of left shoulder, not elsewhere classified: Secondary | ICD-10-CM | POA: Diagnosis not present

## 2020-12-01 DIAGNOSIS — M25561 Pain in right knee: Secondary | ICD-10-CM | POA: Diagnosis not present

## 2020-12-01 DIAGNOSIS — M170 Bilateral primary osteoarthritis of knee: Secondary | ICD-10-CM | POA: Diagnosis not present

## 2020-12-01 NOTE — Therapy (Signed)
[place], [place], [address] Phone: [phone]   Fax:  [phone]  Physical Therapy Treatment  Patient Details  Name: Alan Wang MRN: [id number] Date of Birth: 01-15-1951 Referring Provider (PT): Bo Merino, MD   Encounter Date: 12/01/2020   PT End of Session - 12/01/20 1459     Visit Number 3    Number of Visits 17    Authorization Type [employer] MEDICARE HMO/PPO    Authorization Time Period FOTO reassessments on the 5th and 10th visits    PT Start Time 1500    PT Stop Time 1535    PT Time Calculation (min) 35 min    Activity Tolerance Patient tolerated treatment well    Behavior During Therapy [facility] for tasks assessed/performed             Past Medical History:  Diagnosis Date   Arthritis    Asthma    Coronary artery disease    COVID    Diabetes mellitus    type 2, insulin dependent for 30+ years   History of kidney stones 11/2012   Hyperlipemia    Hypertension    IDDM (insulin dependent diabetes mellitus)    Shortness of breath    Stroke ([place])    hx TIA    Past Surgical History:  Procedure Laterality Date   ANGIOPLASTY  04/06/2011   x3    CARDIAC CATHETERIZATION     CARDIAC CATHETERIZATION N/A 09/16/2015   Procedure: Left Heart Cath and Coronary Angiography;  Surgeon: Adrian Prows, MD;  Location: [facility];  Service: Cardiovascular;  Laterality: N/A;   cataracts     CHOLECYSTECTOMY     HERNIA REPAIR     x2   KNEE ARTHROSCOPY     LEFT HEART CATHETERIZATION WITH CORONARY ANGIOGRAM N/A 03/08/2011   Procedure: LEFT HEART CATHETERIZATION WITH CORONARY ANGIOGRAM;  Surgeon: Laverda Page, MD;  Location: [facility] CATH LAB;  Service: Cardiovascular;  Laterality: N/A;   PERCUTANEOUS CORONARY STENT INTERVENTION (PCI-S) N/A 04/06/2011   Procedure: PERCUTANEOUS CORONARY STENT INTERVENTION (PCI-S);  Surgeon: Laverda Page, MD;  Location: [facility] CATH LAB;  Service: Cardiovascular;   Laterality: N/A;    There were no vitals filed for this visit.   Subjective Assessment - 12/01/20 1457     Subjective Pain in Rt shoulder with reaching and I didnt do much after that.  Took some meds this morning, no knee pain . [place].  I cant deal with rough terrain anymore.    Currently in Pain? No/denies              Skilled therapy interventions:   Therapeutic Exercise: UBE L1 3 min and 3 min   Standing : Extension green band x 15   Row x 15   External rotation green band x 15   Sit to stand x 10 x 10 lbs min cues for controlled descent   2 sets 1 set with weight at chest   Supine lower trunk rotation x 10 with head turns  Knee to chest x 5 each side cues for holding  Hamstring 3 x 20 sec with strap Bridging x 10  Horizontal abd green band x 10   Gait (about 500 feet) for endurance and balance with head turns and nods  Pt with slow pace , shoulder internally rotated.  Mild impairment with head turns, recovered without assist.  PT Short Term Goals - 12/01/20 1459       PT SHORT TERM GOAL #1   Title Pt will be Ind in an initial HEP    Baseline did not do    Status On-going      PT SHORT TERM GOAL #2   Title Pt will voice understanding of measures to assist in reducing pain    Status On-going               PT Long Term Goals - 11/13/20 2122       PT LONG TERM GOAL #1   Title Pt will be Ind in a final HEP    Status New    Target Date 01/17/21      PT LONG TERM GOAL #2   Title Increase Bilat shoulder ROM to 130d for improved overhead functional reach    Baseline R 120, L 125    Status New    Target Date 01/17/21      PT LONG TERM GOAL #3   Title Pt will report improved walking tolerance to 20 mins for improved functional activity tolerance    Status New    Target Date 01/17/21      PT LONG TERM GOAL #4   Title Pt will report an improve general body pain level to 2 or less from improved sleep and  QOL    Status New    Target Date 01/17/21      PT LONG TERM GOAL #5   Title Add LTGs as appropriate as 5xSTS, 2MTW and Berg are assessed and as well as when specific areas of pain are assessed    Status New    Target Date 01/17/21                   Plan - 12/01/20 1500     Clinical Impression Statement Pt offered min feedback regarding exercises. He is able to perform exercises but lacks body awarness related to gait velocity, posture and balance reactions. He is not buying in to therapy from my perspective.  He did acquire a pedometer to increase awareness of activity and perhaps make a positive connection to symptoms he is experiencing. He will be on vacation next week.  Encouraged him to walk and stretch while on his trip.    PT Treatment/Interventions ADLs/Self Care Home Management;Aquatic Therapy;Cryotherapy;Electrical Stimulation;Iontophoresis 4mg /ml Dexamethasone;Moist Heat;Ultrasound;Balance training;Therapeutic exercise;Therapeutic activities;Functional mobility training;Stair training;Gait training;Patient/family education;Neuromuscular re-education;Manual techniques;Taping;Joint Manipulations;Dry needling    PT Next Visit Plan ?walking program. UE ex to address strength, posture, and ROM.  Assess areas of pain , stretch hips, knees and trunk    PT Home Exercise Plan Walking program [id number]    Consulted and Agree with Plan of Care Patient             Patient will benefit from skilled therapeutic intervention in order to improve the following deficits and impairments:  Difficulty walking, Decreased range of motion, Decreased activity tolerance, Impaired UE functional use, Pain, Postural dysfunction, Decreased strength, Decreased balance  Visit Diagnosis: Chronic pain in right shoulder  Primary osteoarthritis of both hands  Decreased ROM of right shoulder  Primary osteoarthritis of both knees  Pain in both hands  Decreased ROM of left shoulder     Problem  List Patient Active Problem List   Diagnosis Date Noted   TIA (transient ischemic attack) 06/22/2016   Type 2 diabetes mellitus without complication ([place]) 58/11/9831   Primary osteoarthritis of both hands 06/03/2016  Primary osteoarthritis of both knees 06/03/2016   Generalized pain 06/03/2016   Memory difficulties 06/03/2016   History of diabetes mellitus 06/03/2016   History of coronary artery disease 06/03/2016   History of hypertension 06/03/2016   History of hypercholesterolemia 06/03/2016   High risk medication use 06/03/2016   History of high cholesterol 06/03/2016   ANA positive 06/03/2016   Inflammatory arthritis 10/29/2015   Vitamin D deficiency 10/29/2015   Fatigue 09/24/2015   MGUS (monoclonal gammopathy of unknown significance) 09/22/2015   Vertigo 10/31/2013   Chronic cough 11/16/2012   CAD (coronary artery disease), native coronary artery 03/08/2011   Postsurgical percutaneous transluminal coronary angioplasty (PTCA) status 03/08/2011   Type II or unspecified type diabetes mellitus with other coma, not stated as uncontrolled 03/08/2011   Hypertension associated with diabetes ([place]) 03/08/2011   Shortness of breath 03/08/2011   Mixed hyperlipidemia 03/08/2011    Kai Calico, PT 12/01/2020, 5:36 PM  [facility] [address] [place], [place], [address] Phone: [phone]   Fax:  [phone]  Name: Alan Wang MRN: [id number] Date of Birth: [birth year]  Raeford Razor, PT 12/01/20 5:36 PM Phone: [phone] Fax: [phone]

## 2020-12-03 ENCOUNTER — Ambulatory Visit: Payer: Medicare HMO | Admitting: Physical Therapy

## 2020-12-11 ENCOUNTER — Other Ambulatory Visit: Payer: Self-pay

## 2020-12-11 MED ORDER — SULFASALAZINE 500 MG PO TABS: ORAL_TABLET | ORAL | 0 refills | Status: DC

## 2020-12-11 NOTE — Telephone Encounter (Signed)
Next Visit: 04/07/2021  Last Visit: 10/21/2020  Last Fill: 09/16/2020  DX: Inflammatory arthritis  Current Dose per office note 10/21/2020: sulfasalazine 500 mg 2 tablets twice daily  Labs: 10/21/2020 CBC and CMP normal.  Glucose is mildly elevated, probably not a fasting sample.  Okay to refill SSZ?

## 2020-12-11 NOTE — Telephone Encounter (Signed)
[facility] requested prescription refill for patient's Sulfasalazine.  This is a new pharmacy for this prescription.

## 2020-12-15 ENCOUNTER — Other Ambulatory Visit: Payer: Self-pay

## 2020-12-15 ENCOUNTER — Ambulatory Visit: Payer: Medicare HMO | Attending: Family Medicine | Admitting: Physical Therapy

## 2020-12-15 ENCOUNTER — Encounter: Payer: Self-pay | Admitting: Physical Therapy

## 2020-12-15 DIAGNOSIS — M79641 Pain in right hand: Secondary | ICD-10-CM | POA: Insufficient documentation

## 2020-12-15 DIAGNOSIS — M79642 Pain in left hand: Secondary | ICD-10-CM | POA: Insufficient documentation

## 2020-12-15 DIAGNOSIS — M25511 Pain in right shoulder: Secondary | ICD-10-CM | POA: Insufficient documentation

## 2020-12-15 DIAGNOSIS — M170 Bilateral primary osteoarthritis of knee: Secondary | ICD-10-CM | POA: Diagnosis not present

## 2020-12-15 DIAGNOSIS — M19042 Primary osteoarthritis, left hand: Secondary | ICD-10-CM | POA: Diagnosis not present

## 2020-12-15 DIAGNOSIS — G8929 Other chronic pain: Secondary | ICD-10-CM | POA: Diagnosis not present

## 2020-12-15 DIAGNOSIS — M25612 Stiffness of left shoulder, not elsewhere classified: Secondary | ICD-10-CM | POA: Diagnosis not present

## 2020-12-15 DIAGNOSIS — M25562 Pain in left knee: Secondary | ICD-10-CM | POA: Insufficient documentation

## 2020-12-15 DIAGNOSIS — M25561 Pain in right knee: Secondary | ICD-10-CM | POA: Diagnosis not present

## 2020-12-15 DIAGNOSIS — M25611 Stiffness of right shoulder, not elsewhere classified: Secondary | ICD-10-CM | POA: Insufficient documentation

## 2020-12-15 DIAGNOSIS — M19041 Primary osteoarthritis, right hand: Secondary | ICD-10-CM | POA: Diagnosis not present

## 2020-12-15 NOTE — Therapy (Signed)
[place], [place], [address] Phone: [phone]   Fax:  [phone]  Physical Therapy Treatment  Patient Details  Name: Alan Wang MRN: [id number] Date of Birth: 1950-12-29 Referring Provider (PT): Bo Merino, MD   Encounter Date: 12/15/2020   PT End of Session - 12/15/20 1503     Visit Number 4    Number of Visits 17    Date for PT Re-Evaluation 01/17/21    Authorization Type [employer] MEDICARE HMO/PPO    Authorization Time Period FOTO reassessments on the 5th and 10th visits    Progress Note Due on Visit 10    PT Start Time 1502    PT Stop Time 1544    PT Time Calculation (min) 42 min    Activity Tolerance Patient tolerated treatment well    Behavior During Therapy [facility] for tasks assessed/performed             Past Medical History:  Diagnosis Date   Arthritis    Asthma    Coronary artery disease    COVID    Diabetes mellitus    type 2, insulin dependent for 30+ years   History of kidney stones 11/2012   Hyperlipemia    Hypertension    IDDM (insulin dependent diabetes mellitus)    Shortness of breath    Stroke ([place])    hx TIA    Past Surgical History:  Procedure Laterality Date   ANGIOPLASTY  04/06/2011   x3    CARDIAC CATHETERIZATION     CARDIAC CATHETERIZATION N/A 09/16/2015   Procedure: Left Heart Cath and Coronary Angiography;  Surgeon: Adrian Prows, MD;  Location: [place] [facility];  Service: Cardiovascular;  Laterality: N/A;   cataracts     CHOLECYSTECTOMY     HERNIA REPAIR     x2   KNEE ARTHROSCOPY     LEFT HEART CATHETERIZATION WITH CORONARY ANGIOGRAM N/A 03/08/2011   Procedure: LEFT HEART CATHETERIZATION WITH CORONARY ANGIOGRAM;  Surgeon: Laverda Page, MD;  Location: [facility] CATH LAB;  Service: Cardiovascular;  Laterality: N/A;   PERCUTANEOUS CORONARY STENT INTERVENTION (PCI-S) N/A 04/06/2011   Procedure: PERCUTANEOUS CORONARY STENT INTERVENTION (PCI-S);  Surgeon:  Laverda Page, MD;  Location: [facility] CATH LAB;  Service: Cardiovascular;  Laterality: N/A;    There were no vitals filed for this visit.   Subjective Assessment - 12/15/20 1505     Subjective "Things are going okay, I was gone for about week in the and I was sore in the L thight which I was unsure if it was a pulled muscle.    Diagnostic tests 05/15/19:Xray L knee- Impression: These findings are consistent with moderate osteoarthritis and   moderate chondromalacia patella.    Patient Stated Goals To not hurt as much. To be more steady on my feet. To improve my activity tolerance.    Currently in Pain? Yes    Pain Score 5     Pain Location Shoulder    Pain Orientation Right    Pain Descriptors / Indicators Aching;Sore    Pain Type Chronic pain    Pain Onset More than a month ago    Pain Frequency Intermittent    Aggravating Factors  unsure                           [facility] Adult PT Treatment/Exercise:  Therapeutic Exercise: Nu-step L5 x 5 min UE/LE LTR 2 x 10 PPT  1 x 10 holding 5 seconds Tactile cues for proper form Maintain PPT with supine marching 2 x 10 with RTB around knees Supine Clamshell with GTB 2 x 12 Chin tuck head lift 5 x 10 sec hold Double ER with scapular retraction 2 x 12 with RTB Rows 2 x 12 GTB seated  Manual Therapy:  MTPR focus on L rectus femoris x 4   Neuromuscular re-ed: N/A  Therapeutic Activity: Sit to stand using rocking forward with nose over toes and reaching forward to assist with biomechanics  Seated rocking forward/ back ward to assist with noes over toes 1 x 10  Verbal cues/ demonstration for proper form Log rolling to get out of bed to promote efficient posture/ mechanics.   Modalities: N/A  Self Care: N/A  Consider / progression for next session:                PT Short Term Goals - 12/01/20 1459       PT SHORT TERM GOAL #1   Title Pt will be Ind in an initial HEP    Baseline did not do    Status  On-going      PT SHORT TERM GOAL #2   Title Pt will voice understanding of measures to assist in reducing pain    Status On-going               PT Long Term Goals - 11/13/20 2122       PT LONG TERM GOAL #1   Title Pt will be Ind in a final HEP    Status New    Target Date 01/17/21      PT LONG TERM GOAL #2   Title Increase Bilat shoulder ROM to 130d for improved overhead functional reach    Baseline R 120, L 125    Status New    Target Date 01/17/21      PT LONG TERM GOAL #3   Title Pt will report improved walking tolerance to 20 mins for improved functional activity tolerance    Status New    Target Date 01/17/21      PT LONG TERM GOAL #4   Title Pt will report an improve general body pain level to 2 or less from improved sleep and QOL    Status New    Target Date 01/17/21      PT LONG TERM GOAL #5   Title Add LTGs as appropriate as 5xSTS, 2MTW and Berg are assessed and as well as when specific areas of pain are assessed    Status New    Target Date 01/17/21                   Plan - 12/15/20 1507     Clinical Impression Statement pt reports he has been working on tracking his walking but did not having increased crampingin the L thigh that [place]. continued working on gross cervical stability, LE / core activation in supine. time taken to review sitting biomecahnics with proper form. continued working on posterior shoulder strengthening which he did well with but required cues for proper form.    PT Treatment/Interventions ADLs/Self Care Home Management;Aquatic Therapy;Cryotherapy;Electrical Stimulation;Iontophoresis 4mg /ml Dexamethasone;Moist Heat;Ultrasound;Balance training;Therapeutic exercise;Therapeutic activities;Functional mobility training;Stair training;Gait training;Patient/family education;Neuromuscular re-education;Manual techniques;Taping;Joint Manipulations;Dry needling    PT Next Visit Plan ?walking program. UE ex to  address strength, posture, and ROM.  Assess areas of pain , stretch hips, knees and trunk  PT Home Exercise Plan Walking program [id number]    Consulted and Agree with Plan of Care Patient             Patient will benefit from skilled therapeutic intervention in order to improve the following deficits and impairments:  Difficulty walking, Decreased range of motion, Decreased activity tolerance, Impaired UE functional use, Pain, Postural dysfunction, Decreased strength, Decreased balance  Visit Diagnosis: Chronic pain in right shoulder  Primary osteoarthritis of both hands  Decreased ROM of right shoulder     Problem List Patient Active Problem List   Diagnosis Date Noted   TIA (transient ischemic attack) 06/22/2016   Type 2 diabetes mellitus without complication ([place]) 26/20/3559   Primary osteoarthritis of both hands 06/03/2016   Primary osteoarthritis of both knees 06/03/2016   Generalized pain 06/03/2016   Memory difficulties 06/03/2016   History of diabetes mellitus 06/03/2016   History of coronary artery disease 06/03/2016   History of hypertension 06/03/2016   History of hypercholesterolemia 06/03/2016   High risk medication use 06/03/2016   History of high cholesterol 06/03/2016   ANA positive 06/03/2016   Inflammatory arthritis 10/29/2015   Vitamin D deficiency 10/29/2015   Fatigue 09/24/2015   MGUS (monoclonal gammopathy of unknown significance) 09/22/2015   Vertigo 10/31/2013   Chronic cough 11/16/2012   CAD (coronary artery disease), native coronary artery 03/08/2011   Postsurgical percutaneous transluminal coronary angioplasty (PTCA) status 03/08/2011   Type II or unspecified type diabetes mellitus with other coma, not stated as uncontrolled 03/08/2011   Hypertension associated with diabetes ([place]) 03/08/2011   Shortness of breath 03/08/2011   Mixed hyperlipidemia 03/08/2011    Starr Lake PT, DPT, LAT, ATC  12/15/20  3:46 PM     [facility] [address] [place], [place], [address] Phone: [phone]   Fax:  [phone]  Name: Alan Wang MRN: [id number] Date of Birth: Aug 24, 1950

## 2020-12-18 ENCOUNTER — Other Ambulatory Visit: Payer: Self-pay

## 2020-12-18 ENCOUNTER — Encounter: Payer: Self-pay | Admitting: Physical Therapy

## 2020-12-18 ENCOUNTER — Ambulatory Visit: Payer: Medicare HMO | Admitting: Physical Therapy

## 2020-12-18 DIAGNOSIS — M25511 Pain in right shoulder: Secondary | ICD-10-CM | POA: Diagnosis not present

## 2020-12-18 DIAGNOSIS — M79642 Pain in left hand: Secondary | ICD-10-CM | POA: Diagnosis not present

## 2020-12-18 DIAGNOSIS — M25612 Stiffness of left shoulder, not elsewhere classified: Secondary | ICD-10-CM | POA: Diagnosis not present

## 2020-12-18 DIAGNOSIS — G8929 Other chronic pain: Secondary | ICD-10-CM

## 2020-12-18 DIAGNOSIS — M19042 Primary osteoarthritis, left hand: Secondary | ICD-10-CM | POA: Diagnosis not present

## 2020-12-18 DIAGNOSIS — M19041 Primary osteoarthritis, right hand: Secondary | ICD-10-CM | POA: Diagnosis not present

## 2020-12-18 DIAGNOSIS — M25562 Pain in left knee: Secondary | ICD-10-CM

## 2020-12-18 DIAGNOSIS — M170 Bilateral primary osteoarthritis of knee: Secondary | ICD-10-CM

## 2020-12-18 DIAGNOSIS — M25611 Stiffness of right shoulder, not elsewhere classified: Secondary | ICD-10-CM

## 2020-12-18 DIAGNOSIS — M25561 Pain in right knee: Secondary | ICD-10-CM | POA: Diagnosis not present

## 2020-12-18 DIAGNOSIS — M79641 Pain in right hand: Secondary | ICD-10-CM

## 2020-12-18 NOTE — Therapy (Signed)
[place], [place], [address] Phone: [phone]   Fax:  [phone]  Physical Therapy Treatment  Patient Details  Name: Alan Wang MRN: [id number] Date of Birth: [birth year] Referring Provider (PT): Bo Merino, MD   Encounter Date: 12/18/2020   PT End of Session - 12/18/20 1025     Visit Number 5    Number of Visits 17    Date for PT Re-Evaluation 01/17/21    Authorization Type [employer] MEDICARE HMO/PPO    Authorization Time Period FOTO reassessments on the 5th and 10th visits    PT Start Time 1016    PT Stop Time 1100    PT Time Calculation (min) 44 min    Activity Tolerance Patient tolerated treatment well    Behavior During Therapy [facility] for tasks assessed/performed             Past Medical History:  Diagnosis Date   Arthritis    Asthma    Coronary artery disease    COVID    Diabetes mellitus    type 2, insulin dependent for 30+ years   History of kidney stones 11/2012   Hyperlipemia    Hypertension    IDDM (insulin dependent diabetes mellitus)    Shortness of breath    Stroke ([place])    hx TIA    Past Surgical History:  Procedure Laterality Date   ANGIOPLASTY  04/06/2011   x3    CARDIAC CATHETERIZATION     CARDIAC CATHETERIZATION N/A 09/16/2015   Procedure: Left Heart Cath and Coronary Angiography;  Surgeon: Adrian Prows, MD;  Location: [facility];  Service: Cardiovascular;  Laterality: N/A;   cataracts     CHOLECYSTECTOMY     HERNIA REPAIR     x2   KNEE ARTHROSCOPY     LEFT HEART CATHETERIZATION WITH CORONARY ANGIOGRAM N/A 03/08/2011   Procedure: LEFT HEART CATHETERIZATION WITH CORONARY ANGIOGRAM;  Surgeon: Laverda Page, MD;  Location: [facility] CATH LAB;  Service: Cardiovascular;  Laterality: N/A;   PERCUTANEOUS CORONARY STENT INTERVENTION (PCI-S) N/A 04/06/2011   Procedure: PERCUTANEOUS CORONARY STENT INTERVENTION (PCI-S);  Surgeon: Laverda Page, MD;  Location: [facility]  CATH LAB;  Service: Cardiovascular;  Laterality: N/A;    There were no vitals filed for this visit.   Subjective Assessment - 12/18/20 1023     Subjective I am feeling a little sick on my stomach this morning.  I dont really have pain this morning.  but i did have pain in my R shld. I cant sleep on my shld  I sleep on my back or my left shld    Limitations House hold activities;Other (comment)    Diagnostic tests 05/15/19:Xray L knee- Impression: These findings are consistent with moderate osteoarthritis and   moderate chondromalacia patella.    Patient Stated Goals To not hurt as much. To be more steady on my feet. To improve my activity tolerance.    Pain Score 5     Pain Location Shoulder    Pain Orientation Right    Pain Descriptors / Indicators Aching;Sore    Pain Type Chronic pain    Pain Onset More than a month ago                [facility] PT Assessment - 12/18/20 0001       Observation/Other Assessments   Focus on Therapeutic Outcomes (FOTO)  51% functional ability  [facility] Adult PT Treatment/Exercise:   Therapeutic Exercise: Nu-step L6 x 5 min UE/LE Sit to stand with 10 lb with 2 min rest between 3 x 10 LTR 2 x 10 PPT 1 x 10 holding 5 seconds Tactile cues for proper form Maintain PPT with supine marching 2 x 10 with RTB around knees Supine Clamshell with GTB 2 x 12 Bridge with ball squeeze 2 x 12 Chin tuck head lift 5 x 10 sec hold Double ER with scapular retraction  and towel roll 2 x 12 with RTB Rows 2 x 12 GTB seated Sidelylying left with shld flex with 1 lb   2 x 10 Sidelying R with shld abd with 1 lb  2 x10 Manual Therapy:  MTPR focus on  R shoulder KT tape for posture tactile input      Modalities: N/A   Self Care: N/A   Consider / progression for next session:  Not done this visit  Sit to stand using rocking forward with nose over toes and reaching forward to assist with biomechanics  Seated rocking forward/ back ward to assist  with noes over toes 1 x 10  Verbal cues/ demonstration for proper form Log rolling to get out of bed to promote efficient posture/ mechanics.                            PT Short Term Goals - 12/01/20 1459       PT SHORT TERM GOAL #1   Title Pt will be Ind in an initial HEP    Baseline did not do    Status On-going      PT SHORT TERM GOAL #2   Title Pt will voice understanding of measures to assist in reducing pain    Status On-going               PT Long Term Goals - 11/13/20 2122       PT LONG TERM GOAL #1   Title Pt will be Ind in a final HEP    Status New    Target Date 01/17/21      PT LONG TERM GOAL #2   Title Increase Bilat shoulder ROM to 130d for improved overhead functional reach    Baseline R 120, L 125    Status New    Target Date 01/17/21      PT LONG TERM GOAL #3   Title Pt will report improved walking tolerance to 20 mins for improved functional activity tolerance    Status New    Target Date 01/17/21      PT LONG TERM GOAL #4   Title Pt will report an improve general body pain level to 2 or less from improved sleep and QOL    Status New    Target Date 01/17/21      PT LONG TERM GOAL #5   Title Add LTGs as appropriate as 5xSTS, 2MTW and Berg are assessed and as well as when specific areas of pain are assessed    Status New    Target Date 01/17/21                   Plan - 12/18/20 1019     Clinical Impression Statement Pt comes into clinic complaining of upset stomach but still able to exercise, Pt given 10 lb weight  to improve sit to stand ability which provided counter balance to standing with more stability.  Pt complained about R shld pain today and KT tape applied to give tactile stimulus for increasing postural awareness.  Pt able to lie on side and perform shld exercise without pain .  Pt FOTO improved 1% from 50 to %1%  Will continue  toward POC and achievement of goals    Personal Factors and  Comorbidities Comorbidity 1;Comorbidity 3+;Comorbidity 2    Comorbidities Arthritis, asthma, CAD, IDDM, HTN, SOB, TIA    Examination-Activity Limitations Stairs;Stand;Locomotion Level;Transfers    Examination-Participation Restrictions Other    PT Frequency 2x / week    PT Duration 8 weeks    PT Treatment/Interventions ADLs/Self Care Home Management;Aquatic Therapy;Cryotherapy;Electrical Stimulation;Iontophoresis 4mg /ml Dexamethasone;Moist Heat;Ultrasound;Balance training;Therapeutic exercise;Therapeutic activities;Functional mobility training;Stair training;Gait training;Patient/family education;Neuromuscular re-education;Manual techniques;Taping;Joint Manipulations;Dry needling    PT Next Visit Plan ?walking program. UE ex to address strength, posture, and ROM.  Assess areas of pain , stretch hips, knees and trunk    PT Home Exercise Plan Walking program [id number]    Consulted and Agree with Plan of Care Patient             Patient will benefit from skilled therapeutic intervention in order to improve the following deficits and impairments:  Difficulty walking, Decreased range of motion, Decreased activity tolerance, Impaired UE functional use, Pain, Postural dysfunction, Decreased strength, Decreased balance  Visit Diagnosis: Chronic pain in right shoulder  Primary osteoarthritis of both hands  Decreased ROM of right shoulder  Primary osteoarthritis of both knees  Pain in both hands  Decreased ROM of left shoulder  Chronic pain of both knees     Problem List Patient Active Problem List   Diagnosis Date Noted   TIA (transient ischemic attack) 06/22/2016   Type 2 diabetes mellitus without complication ([place]) 41/28/7867   Primary osteoarthritis of both hands 06/03/2016   Primary osteoarthritis of both knees 06/03/2016   Generalized pain 06/03/2016   Memory difficulties 06/03/2016   History of diabetes mellitus 06/03/2016   History of coronary artery disease 06/03/2016    History of hypertension 06/03/2016   History of hypercholesterolemia 06/03/2016   High risk medication use 06/03/2016   History of high cholesterol 06/03/2016   ANA positive 06/03/2016   Inflammatory arthritis 10/29/2015   Vitamin D deficiency 10/29/2015   Fatigue 09/24/2015   MGUS (monoclonal gammopathy of unknown significance) 09/22/2015   Vertigo 10/31/2013   Chronic cough 11/16/2012   CAD (coronary artery disease), native coronary artery 03/08/2011   Postsurgical percutaneous transluminal coronary angioplasty (PTCA) status 03/08/2011   Type II or unspecified type diabetes mellitus with other coma, not stated as uncontrolled 03/08/2011   Hypertension associated with diabetes ([place]) 03/08/2011   Shortness of breath 03/08/2011   Mixed hyperlipidemia 03/08/2011   Voncille Lo, PT, [place] Certified Exercise Expert for the Aging Adult  12/18/20 1:41 PM Phone: [phone] Fax: [place] [facility] [address] [place], [place], [address] Phone: [phone]   Fax:  [phone]  Name: Alan Wang MRN: [id number] Date of Birth: 05/17/1950

## 2020-12-23 ENCOUNTER — Ambulatory Visit: Payer: Medicare HMO | Admitting: Physical Therapy

## 2020-12-24 DIAGNOSIS — L814 Other melanin hyperpigmentation: Secondary | ICD-10-CM | POA: Diagnosis not present

## 2020-12-24 DIAGNOSIS — L720 Epidermal cyst: Secondary | ICD-10-CM | POA: Diagnosis not present

## 2020-12-24 DIAGNOSIS — L570 Actinic keratosis: Secondary | ICD-10-CM | POA: Diagnosis not present

## 2020-12-24 DIAGNOSIS — L821 Other seborrheic keratosis: Secondary | ICD-10-CM | POA: Diagnosis not present

## 2020-12-25 ENCOUNTER — Ambulatory Visit: Payer: Medicare HMO | Admitting: Physical Therapy

## 2020-12-25 ENCOUNTER — Other Ambulatory Visit: Payer: Self-pay

## 2020-12-25 DIAGNOSIS — G8929 Other chronic pain: Secondary | ICD-10-CM | POA: Diagnosis not present

## 2020-12-25 DIAGNOSIS — M19042 Primary osteoarthritis, left hand: Secondary | ICD-10-CM

## 2020-12-25 DIAGNOSIS — M25611 Stiffness of right shoulder, not elsewhere classified: Secondary | ICD-10-CM

## 2020-12-25 DIAGNOSIS — M170 Bilateral primary osteoarthritis of knee: Secondary | ICD-10-CM

## 2020-12-25 DIAGNOSIS — M25561 Pain in right knee: Secondary | ICD-10-CM | POA: Diagnosis not present

## 2020-12-25 DIAGNOSIS — M79642 Pain in left hand: Secondary | ICD-10-CM | POA: Diagnosis not present

## 2020-12-25 DIAGNOSIS — M79641 Pain in right hand: Secondary | ICD-10-CM | POA: Diagnosis not present

## 2020-12-25 DIAGNOSIS — M25612 Stiffness of left shoulder, not elsewhere classified: Secondary | ICD-10-CM

## 2020-12-25 DIAGNOSIS — M25511 Pain in right shoulder: Secondary | ICD-10-CM

## 2020-12-25 DIAGNOSIS — M19041 Primary osteoarthritis, right hand: Secondary | ICD-10-CM | POA: Diagnosis not present

## 2020-12-25 NOTE — Patient Instructions (Signed)

## 2020-12-25 NOTE — Therapy (Signed)
[facility] [address] [place], [place], [address] Phone: [phone]   Fax:  [phone]  Physical Therapy Treatment  Patient Details  Name: Alan Wang MRN: [id number] Date of Birth: 03/03/1951 Referring Provider (PT): Deveshwar, Shaili, MD   Encounter Date: 12/25/2020   PT End of Session - 12/25/20 1027     Visit Number 6    Number of Visits 17    Date for PT Re-Evaluation 01/17/21    Authorization Type [employer] MEDICARE HMO/PPO    Authorization Time Period FOTO reassessments on the 5th and 10th visits    PT Start Time 1021    PT Stop Time 1100    PT Time Calculation (min) 39 min    Activity Tolerance Patient tolerated treatment well    Behavior During Therapy WFL for tasks assessed/performed             Past Medical History:  Diagnosis Date   Arthritis    Asthma    Coronary artery disease    COVID    Diabetes mellitus    type 2, insulin dependent for 30+ years   History of kidney stones 11/2012   Hyperlipemia    Hypertension    IDDM (insulin dependent diabetes mellitus)    Shortness of breath    Stroke ([facility])    hx TIA    Past Surgical History:  Procedure Laterality Date   ANGIOPLASTY  04/06/2011   x3    CARDIAC CATHETERIZATION     CARDIAC CATHETERIZATION N/A 09/16/2015   Procedure: Left Heart Cath and Coronary Angiography;  Surgeon: Jay Ganji, MD;  Location: [facility] INVASIVE CV LAB;  Service: Cardiovascular;  Laterality: N/A;   cataracts     CHOLECYSTECTOMY     HERNIA REPAIR     x2   KNEE ARTHROSCOPY     LEFT HEART CATHETERIZATION WITH CORONARY ANGIOGRAM N/A 03/08/2011   Procedure: LEFT HEART CATHETERIZATION WITH CORONARY ANGIOGRAM;  Surgeon: Jagadeesh R Ganji, MD;  Location: [facility] CATH LAB;  Service: Cardiovascular;  Laterality: N/A;   PERCUTANEOUS CORONARY STENT INTERVENTION (PCI-S) N/A 04/06/2011   Procedure: PERCUTANEOUS CORONARY STENT INTERVENTION (PCI-S);  Surgeon: Jagadeesh R Ganji, MD;  Location: [facility]  CATH LAB;  Service: Cardiovascular;  Laterality: N/A;    There were no vitals filed for this visit.   Subjective Assessment - 12/25/20 1026     Subjective I am generally fatigued today. We had to come to town yesterday for dermatologist.  and he took a cyst off my back. Went to [place] for autumn leaves festival and walked from booth to booth. that reminder tape did not really do anything for my posture or back. hard to get my jacket on and off my R shld    Limitations House hold activities;Other (comment)    How long can you walk comfortably? able to enjoy a festival    Diagnostic tests 05/15/19:Xray L knee- Impression: These findings are consistent with moderate osteoarthritis and   moderate chondromalacia patella.    Patient Stated Goals To not hurt as much. To be more steady on my feet. To improve my activity tolerance.    Currently in Pain? Yes    Pain Score 4     Pain Location Shoulder    Pain Orientation Right    Pain Descriptors / Indicators Aching;Sore    Pain Type Chronic pain    Pain Onset More than a month ago                   High risk medication use 06/03/2016   History of high cholesterol 06/03/2016   ANA positive 06/03/2016   Inflammatory arthritis 10/29/2015   Vitamin D deficiency 10/29/2015   Fatigue 09/24/2015   MGUS (monoclonal gammopathy of unknown significance) 09/22/2015   Vertigo 10/31/2013   Chronic cough 11/16/2012   CAD (coronary artery  disease), native coronary artery 03/08/2011   Postsurgical percutaneous transluminal coronary angioplasty (PTCA) status 03/08/2011   Type II or unspecified type diabetes mellitus with other coma, not stated as uncontrolled 03/08/2011   Hypertension associated with diabetes ([place]) 03/08/2011   Shortness of breath 03/08/2011   Mixed hyperlipidemia 03/08/2011   Voncille Lo, PT, [place] Certified Exercise Expert for the Aging Adult  12/25/20 1:32 PM Phone: [phone] Fax: [place] [facility] [address] [place], [place], [address] Phone: [phone]   Fax:  [phone]  Name: Alan Wang MRN: [id number] Date of Birth: [birth year]  High risk medication use 06/03/2016   History of high cholesterol 06/03/2016   ANA positive 06/03/2016   Inflammatory arthritis 10/29/2015   Vitamin D deficiency 10/29/2015   Fatigue 09/24/2015   MGUS (monoclonal gammopathy of unknown significance) 09/22/2015   Vertigo 10/31/2013   Chronic cough 11/16/2012   CAD (coronary artery  disease), native coronary artery 03/08/2011   Postsurgical percutaneous transluminal coronary angioplasty (PTCA) status 03/08/2011   Type II or unspecified type diabetes mellitus with other coma, not stated as uncontrolled 03/08/2011   Hypertension associated with diabetes ([place]) 03/08/2011   Shortness of breath 03/08/2011   Mixed hyperlipidemia 03/08/2011   Voncille Lo, PT, [place] Certified Exercise Expert for the Aging Adult  12/25/20 1:32 PM Phone: [phone] Fax: [place] [facility] [address] [place], [place], [address] Phone: [phone]   Fax:  [phone]  Name: Alan Wang MRN: [id number] Date of Birth: [birth year]  High risk medication use 06/03/2016   History of high cholesterol 06/03/2016   ANA positive 06/03/2016   Inflammatory arthritis 10/29/2015   Vitamin D deficiency 10/29/2015   Fatigue 09/24/2015   MGUS (monoclonal gammopathy of unknown significance) 09/22/2015   Vertigo 10/31/2013   Chronic cough 11/16/2012   CAD (coronary artery  disease), native coronary artery 03/08/2011   Postsurgical percutaneous transluminal coronary angioplasty (PTCA) status 03/08/2011   Type II or unspecified type diabetes mellitus with other coma, not stated as uncontrolled 03/08/2011   Hypertension associated with diabetes ([facility]) 03/08/2011   Shortness of breath 03/08/2011   Mixed hyperlipidemia 03/08/2011    , PT, [place] Certified Exercise Expert for the Aging Adult  12/25/20 1:32 PM Phone: [phone] Fax: [phone]   [facility] [address] [place], [place], [address] Phone: [phone]   Fax:  [phone]  Name: Alan Wang MRN: [id number] Date of Birth: 11/04/1950    

## 2021-01-01 ENCOUNTER — Other Ambulatory Visit: Payer: Self-pay

## 2021-01-01 ENCOUNTER — Ambulatory Visit: Payer: Medicare HMO | Admitting: Physical Therapy

## 2021-01-01 DIAGNOSIS — M19041 Primary osteoarthritis, right hand: Secondary | ICD-10-CM

## 2021-01-01 DIAGNOSIS — M25611 Stiffness of right shoulder, not elsewhere classified: Secondary | ICD-10-CM

## 2021-01-01 DIAGNOSIS — G8929 Other chronic pain: Secondary | ICD-10-CM | POA: Diagnosis not present

## 2021-01-01 DIAGNOSIS — M25612 Stiffness of left shoulder, not elsewhere classified: Secondary | ICD-10-CM

## 2021-01-01 DIAGNOSIS — M79641 Pain in right hand: Secondary | ICD-10-CM

## 2021-01-01 DIAGNOSIS — M19042 Primary osteoarthritis, left hand: Secondary | ICD-10-CM | POA: Diagnosis not present

## 2021-01-01 DIAGNOSIS — M25511 Pain in right shoulder: Secondary | ICD-10-CM | POA: Diagnosis not present

## 2021-01-01 DIAGNOSIS — M79642 Pain in left hand: Secondary | ICD-10-CM | POA: Diagnosis not present

## 2021-01-01 DIAGNOSIS — M170 Bilateral primary osteoarthritis of knee: Secondary | ICD-10-CM | POA: Diagnosis not present

## 2021-01-01 DIAGNOSIS — M25561 Pain in right knee: Secondary | ICD-10-CM | POA: Diagnosis not present

## 2021-01-01 DIAGNOSIS — M25562 Pain in left knee: Secondary | ICD-10-CM

## 2021-01-01 NOTE — Therapy (Addendum)
[place], [place], [address] Phone: [phone]   Fax:  [phone]  Physical Therapy Treatment/Discharge Note  Patient Details  Name: WES LEZOTTE MRN: [id number] Date of Birth: 03/14/1950 Referring Provider (PT): Bo Merino, MD   Encounter Date: 01/01/2021   PT End of Session - 01/01/21 1023     Visit Number 7    Number of Visits 17    Date for PT Re-Evaluation 01/17/21    Authorization Type [employer] MEDICARE HMO/PPO    Authorization Time Period FOTO reassessments on the 5th and 10th visits    PT Start Time 1022    PT Stop Time 1115    PT Time Calculation (min) 53 min    Activity Tolerance Patient tolerated treatment well    Behavior During Therapy [facility] for tasks assessed/performed             Past Medical History:  Diagnosis Date   Arthritis    Asthma    Coronary artery disease    COVID    Diabetes mellitus    type 2, insulin dependent for 30+ years   History of kidney stones 11/2012   Hyperlipemia    Hypertension    IDDM (insulin dependent diabetes mellitus)    Shortness of breath    Stroke ([place])    hx TIA    Past Surgical History:  Procedure Laterality Date   ANGIOPLASTY  04/06/2011   x3    CARDIAC CATHETERIZATION     CARDIAC CATHETERIZATION N/A 09/16/2015   Procedure: Left Heart Cath and Coronary Angiography;  Surgeon: Adrian Prows, MD;  Location: [place] [facility];  Service: Cardiovascular;  Laterality: N/A;   cataracts     CHOLECYSTECTOMY     HERNIA REPAIR     x2   KNEE ARTHROSCOPY     LEFT HEART CATHETERIZATION WITH CORONARY ANGIOGRAM N/A 03/08/2011   Procedure: LEFT HEART CATHETERIZATION WITH CORONARY ANGIOGRAM;  Surgeon: Laverda Page, MD;  Location: [facility] CATH LAB;  Service: Cardiovascular;  Laterality: N/A;   PERCUTANEOUS CORONARY STENT INTERVENTION (PCI-S) N/A 04/06/2011   Procedure: PERCUTANEOUS CORONARY STENT INTERVENTION (PCI-S);  Surgeon: Laverda Page, MD;   Location: [facility] CATH LAB;  Service: Cardiovascular;  Laterality: N/A;    There were no vitals filed for this visit.   Subjective Assessment - 01/01/21 1026     Subjective I tried to do about 30 minutes a day since I saw you list.    I feel stiff and fatigued.    Limitations House hold activities;Other (comment)    Diagnostic tests 05/15/19:Xray L knee- Impression: These findings are consistent with moderate osteoarthritis and   moderate chondromalacia patella.    Patient Stated Goals To not hurt as much. To be more steady on my feet. To improve my activity tolerance.    Currently in Pain? Yes    Pain Score 4     Pain Location Shoulder    Pain Orientation Right    Pain Descriptors / Indicators Aching;Sore    Pain Type Chronic pain    Multiple Pain Sites Yes    Pain Score 5    Pain Location Knee    Pain Orientation Left    Pain Descriptors / Indicators Spasm    Pain Type Chronic pain    Pain Onset More than a month ago             Added to HEP Access Code: [email or web address]: [email or web address]: 10/27/2022Prepared by: Donnetta Simpers BeardsleyExercises  Shoulder External Rotation and Scapular Retraction with Resistance - 2 x daily - 7 x weekly - 3 sets - 10 reps - 3-5 sec hold  Scapular Retraction with Resistance - 2 x daily - 7 x weekly - 3 sets - 10 reps - 3-5 sec hold  Scapular Retraction with Resistance Advanced - 2 x daily - 7 x weekly - 3 sets - 10 reps - 3-5 sec hold  Gastroc Stretch on Wall - 2 x daily - 7 x weekly - 1 sets - 3 reps - 30 seconds hold   Therapeutic Exercise: Nu-step L6 x 5 min UE/LE Sit to stand with 15 lb x 10 Chin tuck head lift 5 x 10 sec hold Double ER with scapular retraction  and towel roll 2 x 12 with GTB Rows 2 x 12 GTB standing 6.   Standing bil shld extension with GTB on door knob 2 x 12 7.   Posterior capsule R stretch with PT VC and TC 8.   Gastroc stretch 3 x 30 sec R and L Manual Therapy:  MTPR focus on  R shoulder post TPDN   infraspinatus/ sub scapularis  PT with R shld AP mob for post shld capsule.  Assisted pt with sleeper stretch . Pt with initial pain reduced post manual     Modalities: Moist Hot pack to R shld sitting next to wife in gym 15 min   Self Care: N/A   Consider / progression for next session:   Not done this visit   Sit to stand using rocking forward with nose over toes and reaching forward to assist with biomechanics  Seated rocking forward/ back ward to assist with noes over toes 1 x 10  Verbal cues/ demonstration for proper form Log rolling to get out of bed to promote efficient posture/ mechanics.  LTR 2 x 10 PPT 1 x 10 holding 5 seconds Tactile cues for proper form Maintain PPT with supine marching 2 x 10 with RTB around knees Supine Clamshell with GTB 2 x 12 Bridge with ball squeeze 2 x 12 Sidelylying left with shld flex with 1 lb   2 x 10 VC and TC  pt tends to internally rotate shld Sidelying R with shld abd with 1 lb 2 x10 VC and TC                Trigger Point Dry Needling - 01/01/21 0001     Consent Given? Yes    Education Handout Provided Previously provided    Muscles Treated Upper Quadrant Pectoralis minor;Pectoralis major;Infraspinatus   R only   Dry Needling Comments 50 mm .30 gage    Upper Trapezius Response --    Pectoralis Major Response Twitch response elicited;Palpable increased muscle length    Pectoralis Minor Response Twitch response elicited;Palpable increased muscle length    Subscapularis Response Twitch response elicited;Palpable increased muscle length                   PT Education - 01/01/21 1131     Education Details added strengtheining  for shld to HEP and gastroc stretch    Person(s) Educated Patient    Methods Explanation;Demonstration;Tactile cues;Verbal cues;Handout    Comprehension Verbalized understanding;Returned demonstration              PT Short Term Goals - 12/25/20 1330       PT SHORT TERM GOAL #1    Title Pt will be Ind in an initial HEP  Baseline Pt given intial HEP- does not do consistently    Status On-going      PT SHORT TERM GOAL #2   Title Pt will voice understanding of measures to assist in reducing pain    Baseline understands that moving helps and tries to walk    Status Partially Met               PT Long Term Goals - 11/13/20 2122       PT LONG TERM GOAL #1   Title Pt will be Ind in a final HEP    Status New    Target Date 01/17/21      PT LONG TERM GOAL #2   Title Increase Bilat shoulder ROM to 130d for improved overhead functional reach    Baseline R 120, L 125    Status New    Target Date 01/17/21      PT LONG TERM GOAL #3   Title Pt will report improved walking tolerance to 20 mins for improved functional activity tolerance    Status New    Target Date 01/17/21      PT LONG TERM GOAL #4   Title Pt will report an improve general body pain level to 2 or less from improved sleep and QOL    Status New    Target Date 01/17/21      PT LONG TERM GOAL #5   Title Add LTGs as appropriate as 5xSTS, 2MTW and Berg are assessed and as well as when specific areas of pain are assessed    Status New    Target Date 01/17/21                   Plan - 01/01/21 1024     Clinical Impression Statement Pt stated he walked 30 min a day since last visit.  Ptwith 4/10 shld pain and 5/10 R hip/thigh pain. but chose TPDN for R shld this visit.  Pt closely monitored throughout session. Pt with tight Right pectorals/post capsule and tight gastrocs that may contribute to balance deficit.  Pt not consistant with exercises and wife reiterated that husband does not always do his exercises.  will continue to use modalities/manual to get WNL for shld. PT to do goal assessment next visit.    Personal Factors and Comorbidities Comorbidity 1;Comorbidity 3+;Comorbidity 2    Comorbidities Arthritis, asthma, CAD, IDDM, HTN, SOB, TIA    Examination-Activity Limitations  Stairs;Stand;Locomotion Level;Transfers    Examination-Participation Restrictions Other    PT Frequency 2x / week    PT Duration 8 weeks    PT Treatment/Interventions ADLs/Self Care Home Management;Aquatic Therapy;Cryotherapy;Electrical Stimulation;Iontophoresis 92m/ml Dexamethasone;Moist Heat;Ultrasound;Balance training;Therapeutic exercise;Therapeutic activities;Functional mobility training;Stair training;Gait training;Patient/family education;Neuromuscular re-education;Manual techniques;Taping;Joint Manipulations;Dry needling    PT Next Visit Plan UE ex to address strength, posture, and ROM.  Assess areas of pain , stretch hips, knees and trunk  Goals assessed next visit    PT Home Exercise Plan Walking program [email or web address]    Consulted and Agree with Plan of Care Patient             Patient will benefit from skilled therapeutic intervention in order to improve the following deficits and impairments:  Difficulty walking, Decreased range of motion, Decreased activity tolerance, Impaired UE functional use, Pain, Postural dysfunction, Decreased strength, Decreased balance  Visit Diagnosis: Chronic pain in right shoulder  Primary osteoarthritis of both hands  Decreased ROM of right shoulder  Primary osteoarthritis of both knees  Pain in  both hands  Decreased ROM of left shoulder  Chronic pain of both knees     Problem List Patient Active Problem List   Diagnosis Date Noted   TIA (transient ischemic attack) 06/22/2016   Type 2 diabetes mellitus without complication ([place]) 04/30/3610   Primary osteoarthritis of both hands 06/03/2016   Primary osteoarthritis of both knees 06/03/2016   Generalized pain 06/03/2016   Memory difficulties 06/03/2016   History of diabetes mellitus 06/03/2016   History of coronary artery disease 06/03/2016   History of hypertension 06/03/2016   History of hypercholesterolemia 06/03/2016   High risk medication use 06/03/2016   History of high  cholesterol 06/03/2016   ANA positive 06/03/2016   Inflammatory arthritis 10/29/2015   Vitamin D deficiency 10/29/2015   Fatigue 09/24/2015   MGUS (monoclonal gammopathy of unknown significance) 09/22/2015   Vertigo 10/31/2013   Chronic cough 11/16/2012   CAD (coronary artery disease), native coronary artery 03/08/2011   Postsurgical percutaneous transluminal coronary angioplasty (PTCA) status 03/08/2011   Type II or unspecified type diabetes mellitus with other coma, not stated as uncontrolled 03/08/2011   Hypertension associated with diabetes ([place]) 03/08/2011   Shortness of breath 03/08/2011   Mixed hyperlipidemia 03/08/2011   Voncille Lo, PT, [place] Certified Exercise Expert for the Aging Adult  01/01/21 11:33 AM Phone: [phone] Fax: [facility] [address] [place], [place], [address] Phone: [phone]   Fax:  [phone]  Name: AADARSH COZORT MRN: [id number] Date of Birth: 1950/12/05  PHYSICAL THERAPY DISCHARGE SUMMARY  Visits from Start of Care: 7  Current functional level related to goals / functional outcomes: unknown   Remaining deficits: unknown   Education / Equipment: HEP   Patient agrees to discharge. Patient goals were partially met. Patient is being discharged due to not returning since the last visit.  Voncille Lo, PT, [place] Certified Exercise Expert for the Aging Adult  02/10/21 9:20 AM Phone: [phone] Fax: [phone]

## 2021-01-01 NOTE — Patient Instructions (Signed)
Access Code: [email or web address]: [email or web address]: 10/27/2022Prepared by: Donnetta Simpers BeardsleyExercises  Added to HEP  Shoulder External Rotation and Scapular Retraction with Resistance - 2 x daily - 7 x weekly - 3 sets - 10 reps - 3-5 sec hold  Scapular Retraction with Resistance - 2 x daily - 7 x weekly - 3 sets - 10 reps - 3-5 sec hold  Scapular Retraction with Resistance Advanced - 2 x daily - 7 x weekly - 3 sets - 10 reps - 3-5 sec hold  Gastroc Stretch on Wall - 2 x daily - 7 x weekly - 3 reps - 1 sets - 30 seconds hold Voncille Lo, PT, [facility] Certified Exercise Expert for the Aging Adult  01/01/21 10:37 AM Phone: [phone] Fax: [phone]

## 2021-01-09 DIAGNOSIS — M179 Osteoarthritis of knee, unspecified: Secondary | ICD-10-CM | POA: Diagnosis not present

## 2021-01-09 DIAGNOSIS — M19049 Primary osteoarthritis, unspecified hand: Secondary | ICD-10-CM | POA: Diagnosis not present

## 2021-01-09 DIAGNOSIS — N401 Enlarged prostate with lower urinary tract symptoms: Secondary | ICD-10-CM | POA: Diagnosis not present

## 2021-01-09 DIAGNOSIS — M150 Primary generalized (osteo)arthritis: Secondary | ICD-10-CM | POA: Diagnosis not present

## 2021-01-09 DIAGNOSIS — N400 Enlarged prostate without lower urinary tract symptoms: Secondary | ICD-10-CM | POA: Diagnosis not present

## 2021-01-09 DIAGNOSIS — I2510 Atherosclerotic heart disease of native coronary artery without angina pectoris: Secondary | ICD-10-CM | POA: Diagnosis not present

## 2021-01-09 DIAGNOSIS — E119 Type 2 diabetes mellitus without complications: Secondary | ICD-10-CM | POA: Diagnosis not present

## 2021-01-09 DIAGNOSIS — I10 Essential (primary) hypertension: Secondary | ICD-10-CM | POA: Diagnosis not present

## 2021-01-09 DIAGNOSIS — E785 Hyperlipidemia, unspecified: Secondary | ICD-10-CM | POA: Diagnosis not present

## 2021-01-09 DIAGNOSIS — I252 Old myocardial infarction: Secondary | ICD-10-CM | POA: Diagnosis not present

## 2021-01-19 DIAGNOSIS — E113293 Type 2 diabetes mellitus with mild nonproliferative diabetic retinopathy without macular edema, bilateral: Secondary | ICD-10-CM | POA: Diagnosis not present

## 2021-01-19 DIAGNOSIS — E1339 Other specified diabetes mellitus with other diabetic ophthalmic complication: Secondary | ICD-10-CM | POA: Diagnosis not present

## 2021-01-19 LAB — HM DIABETES EYE EXAM

## 2021-02-04 ENCOUNTER — Other Ambulatory Visit: Payer: Self-pay | Admitting: Physician Assistant

## 2021-02-04 DIAGNOSIS — Z79899 Other long term (current) drug therapy: Secondary | ICD-10-CM

## 2021-02-04 NOTE — Telephone Encounter (Signed)
Too early to refill the prescription. Patient's wife advised he is due to update labs. She will have patient come to get it done.

## 2021-02-09 ENCOUNTER — Other Ambulatory Visit: Payer: Self-pay

## 2021-02-09 ENCOUNTER — Encounter: Payer: Self-pay | Admitting: Student

## 2021-02-09 ENCOUNTER — Ambulatory Visit: Payer: Medicare HMO | Admitting: Student

## 2021-02-09 ENCOUNTER — Other Ambulatory Visit: Payer: Self-pay | Admitting: *Deleted

## 2021-02-09 VITALS — BP 135/82 | HR 77 | Temp 98.0°F | Resp 16 | Ht 70.0 in | Wt 208.0 lb

## 2021-02-09 DIAGNOSIS — Z79899 Other long term (current) drug therapy: Secondary | ICD-10-CM

## 2021-02-09 DIAGNOSIS — I2510 Atherosclerotic heart disease of native coronary artery without angina pectoris: Secondary | ICD-10-CM | POA: Diagnosis not present

## 2021-02-09 DIAGNOSIS — R0609 Other forms of dyspnea: Secondary | ICD-10-CM | POA: Diagnosis not present

## 2021-02-09 DIAGNOSIS — I10 Essential (primary) hypertension: Secondary | ICD-10-CM

## 2021-02-09 NOTE — Progress Notes (Signed)
Office Visit Note  Patient: Alan Wang             Date of Birth: 1950-12-09           MRN: [id number]             PCP: Aura Dials, MD Referring: Aura Dials, MD Visit Date: 02/12/2021 Occupation: @GUAROCC @  Subjective:  Other (Right shoulder pain )   History of Present Illness: Alan Wang is a 70 y.o. male with a history of inflammatory arthritis and osteoarthritis.  He has been taking sulfasalazine on a regular basis.  He states recently has been having increased pain and discomfort in almost all of his joints.  His right shoulder joint has been more painful.  He is having difficulty raising his right arm.  He tried physical therapy without any improvement in his symptoms.  He also has discomfort in his lower back which started yesterday after getting up from a chair.  He denies any radiculopathy.  He states the tramadol is not controlling his pain symptoms.   Activities of Daily Living:  Patient reports morning stiffness for 1-2 hours.   Patient Reports nocturnal pain.  Difficulty dressing/grooming: Reports Difficulty climbing stairs: Reports Difficulty getting out of chair: Reports Difficulty using hands for taps, buttons, cutlery, and/or writing: Reports  Review of Systems  Constitutional:  Positive for fatigue.  HENT:  Positive for mouth dryness. Negative for mouth sores and nose dryness.   Eyes:  Negative for pain, itching and dryness.  Respiratory:  Negative for shortness of breath and difficulty breathing.   Cardiovascular:  Negative for chest pain and palpitations.  Gastrointestinal:  Negative for blood in stool, constipation and diarrhea.  Endocrine: Negative for increased urination.  Genitourinary:  Negative for difficulty urinating.  Musculoskeletal:  Positive for joint pain, joint pain, myalgias, morning stiffness and myalgias. Negative for joint swelling and muscle tenderness.  Skin:  Negative for color change, rash and redness.   Allergic/Immunologic: Negative for susceptible to infections.  Neurological:  Positive for dizziness and weakness. Negative for numbness, headaches and memory loss.  Hematological:  Negative for bruising/bleeding tendency.  Psychiatric/Behavioral:  Negative for confusion.    PMFS History:  Patient Active Problem List   Diagnosis Date Noted   TIA (transient ischemic attack) 06/22/2016   Type 2 diabetes mellitus without complication ([place]) 97/41/6384   Primary osteoarthritis of both hands 06/03/2016   Primary osteoarthritis of both knees 06/03/2016   Generalized pain 06/03/2016   Memory difficulties 06/03/2016   History of diabetes mellitus 06/03/2016   History of coronary artery disease 06/03/2016   History of hypertension 06/03/2016   History of hypercholesterolemia 06/03/2016   High risk medication use 06/03/2016   History of high cholesterol 06/03/2016   ANA positive 06/03/2016   Inflammatory arthritis 10/29/2015   Vitamin D deficiency 10/29/2015   Fatigue 09/24/2015   MGUS (monoclonal gammopathy of unknown significance) 09/22/2015   Vertigo 10/31/2013   Chronic cough 11/16/2012   CAD (coronary artery disease), native coronary artery 03/08/2011   Postsurgical percutaneous transluminal coronary angioplasty (PTCA) status 03/08/2011   Type II or unspecified type diabetes mellitus with other coma, not stated as uncontrolled 03/08/2011   Hypertension associated with diabetes ([place]) 03/08/2011   Shortness of breath 03/08/2011   Mixed hyperlipidemia 03/08/2011    Past Medical History:  Diagnosis Date   Arthritis    Asthma    Coronary artery disease    COVID    Diabetes mellitus    type  2, insulin dependent for 30+ years   History of kidney stones 11/2012   Hyperlipemia    Hypertension    IDDM (insulin dependent diabetes mellitus)    Shortness of breath    Stroke ([place])    hx TIA    Family History  Problem Relation Age of Onset   Emphysema Mother    COPD Mother     Alzheimer's disease Father    Asthma Paternal Aunt    Heart attack Paternal Grandfather    Past Surgical History:  Procedure Laterality Date   ANGIOPLASTY  04/06/2011   x3    CARDIAC CATHETERIZATION     CARDIAC CATHETERIZATION N/A 09/16/2015   Procedure: Left Heart Cath and Coronary Angiography;  Surgeon: Adrian Prows, MD;  Location: [place] [facility];  Service: Cardiovascular;  Laterality: N/A;   cataracts     CHOLECYSTECTOMY     HERNIA REPAIR     x2   KNEE ARTHROSCOPY     LEFT HEART CATHETERIZATION WITH CORONARY ANGIOGRAM N/A 03/08/2011   Procedure: LEFT HEART CATHETERIZATION WITH CORONARY ANGIOGRAM;  Surgeon: Laverda Page, MD;  Location: [facility] CATH LAB;  Service: Cardiovascular;  Laterality: N/A;   PERCUTANEOUS CORONARY STENT INTERVENTION (PCI-S) N/A 04/06/2011   Procedure: PERCUTANEOUS CORONARY STENT INTERVENTION (PCI-S);  Surgeon: Laverda Page, MD;  Location: [facility] CATH LAB;  Service: Cardiovascular;  Laterality: N/A;   Social History   Social History Narrative   Patient lives at home with his spouse.   Caffeine Use: occasionally   Immunization History  Administered Date(s) Administered   PFIZER(Purple Top)SARS-COV-2 Vaccination 04/02/2019, 04/23/2019   Pneumococcal Polysaccharide-23 12/14/2011     Objective: Vital Signs: BP 123/74 (BP Location: Left Arm, Patient Position: Sitting, Cuff Size: Normal)   Pulse 82   Ht 5\' 10"  (1.778 m)   Wt 207 lb (93.9 kg)   BMI 29.70 kg/m    Physical Exam Vitals and nursing note reviewed.  Constitutional:      Appearance: He is well-developed.  HENT:     Head: Normocephalic and atraumatic.  Eyes:     Conjunctiva/sclera: Conjunctivae normal.     Pupils: Pupils are equal, round, and reactive to light.  Cardiovascular:     Rate and Rhythm: Normal rate and regular rhythm.     Heart sounds: Normal heart sounds.  Pulmonary:     Effort: Pulmonary effort is normal.     Breath sounds: Normal breath sounds.  Abdominal:     General:  Bowel sounds are normal.     Palpations: Abdomen is soft.  Musculoskeletal:     Cervical back: Normal range of motion and neck supple.  Skin:    General: Skin is warm and dry.     Capillary Refill: Capillary refill takes less than 2 seconds.  Neurological:     Mental Status: He is alert and oriented to person, place, and time.  Psychiatric:        Behavior: Behavior normal.     Musculoskeletal Exam: C-spine was in good range of motion.  He had discomfort with forward flexion, abduction and internal rotation of his right shoulder joint.  There was no synovitis or effusion on my examination.  Left shoulder joint was in full range of motion.  There was no tenderness over elbows, wrist joints, MCPs PIPs and DIPs.  PIP and DIP thickening was noted with no synovitis.  Hip joints and knee joints with good range of motion.  There was no tenderness over ankles or MTPs.  CDAI  Exam: CDAI Score: -- Patient Global: --; Provider Global: -- Swollen: --; Tender: -- Joint Exam 02/12/2021   No joint exam has been documented for this visit   There is currently no information documented on the homunculus. Go to the Rheumatology activity and complete the homunculus joint exam.  Investigation: No additional findings.  Imaging: No results found.  Recent Labs: Lab Results  Component Value Date   WBC 8.4 02/09/2021   HGB 16.3 02/09/2021   PLT 229 02/09/2021   NA 141 02/09/2021   K 5.1 02/09/2021   CL 103 02/09/2021   CO2 30 02/09/2021   GLUCOSE 98 02/09/2021   BUN 17 02/09/2021   CREATININE 0.88 02/09/2021   BILITOT 0.4 02/09/2021   ALKPHOS 76 10/26/2016   AST 17 02/09/2021   ALT 12 02/09/2021   PROT 6.7 02/09/2021   ALBUMIN 4.2 10/26/2016   CALCIUM 9.8 02/09/2021   GFRAA 104 07/10/2020    Speciality Comments: No specialty comments available.  Procedures:  Large Joint Inj: R glenohumeral on 02/12/2021 12:23 PM Indications: pain Details: 27 G 1.5 in needle, posterior  approach  Arthrogram: No  Medications: 1.5 mL lidocaine 1 %; 40 mg triamcinolone acetonide 40 MG/ML Aspirate: 0 mL Outcome: tolerated well, no immediate complications Procedure, treatment alternatives, risks and benefits explained, specific risks discussed. Consent was given by the patient. Immediately prior to procedure a time out was called to verify the correct patient, procedure, equipment, support staff and site/side marked as required. Patient was prepped and draped in the usual sterile fashion.    Allergies: Avandia [rosiglitazone maleate]   Assessment / Plan:     Visit Diagnoses: Inflammatory arthritis-he had no synovitis on my examination.  He has been tolerating sulfasalazine well.  High risk medication use - sulfasalazine 500 mg 2 tablets twice daily.  Labs from February 09, 2021 were reviewed which were within normal limits except for elevated monocytes.  The lab values were forwarded to his hematologist.  He was advised to hold sulfasalazine in case he develops an infection and resume after the infection resolves.  Information about immunization was placed in the AVS.  Chronic right shoulder pain -he has been having pain and discomfort in his right shoulder joint for the last few months.  No warmth swelling or effusion was noted.  Painful range of motion.  He tried physical therapy without any benefit.  Plan: XR Shoulder Right.  X-rays hands are discussed with the patient.  Per his request right shoulder joint was injected with lidocaine and cortisone as described above.  He tolerated the procedure well.  Postprocedure instructions were given.  I also discussed  if he has ongoing pain and discomfort I would refer him to orthopedic surgeon.  Patient will call me back if the symptoms do not resolve.  Primary osteoarthritis of both hands-he had bilateral PIP and DIP thickening with no synovitis.    Primary osteoarthritis of both knees-he continues to have pain and discomfort in his knee  joints.He has moderate osteoarthritis and moderate chondromalacia patella.  History of arthroscopy of left knee  Other chronic pain - He takes tramadol as needed for pain relief.  His PCP has been prescribing tramadol.  According to the patient tramadol has not been effective recently.  Other medical problems listed as follows:  History of hypertension-his blood pressure was normal today.  History of coronary artery disease  History of hypercholesterolemia  History of TIA (transient ischemic attack)  History of diabetes mellitus  History of vitamin  D deficiency  MGUS (monoclonal gammopathy of unknown significance)  Orders: Orders Placed This Encounter  Procedures   XR Shoulder Right    No orders of the defined types were placed in this encounter.   Follow-Up Instructions: Return for Osteoarthritis, inflammatory arthritis.   Bo Merino, MD  Note - This record has been created using Editor, commissioning.  Chart creation errors have been sought, but may not always  have been located. Such creation errors do not reflect on  the standard of medical care.

## 2021-02-09 NOTE — Progress Notes (Signed)
Primary Physician/Referring:  Aura Dials, MD  Patient ID: Alan Wang, male    DOB: 12-09-1950, 70 y.o.   MRN: [id number]  Chief Complaint  Patient presents with   Coronary Artery Disease   Follow-up    1 year   HPI:    Alan Wang  is a 70 y.o. Caucasian male with history of known coronary artery disease. He has had coronary angioplasty to his LAD, circumflex and also ramus intermediate vessels.  He has history of diabetes mellitus with diabetic retinopathy, hypertension and hyperlipidemia.  Last coronary angiography on 09/16/2015 after a abnormal stress test revealing fully occluded moderate sized PL branch of RCA but had excellent collaterals.  Patient presents for annual follow-up of CAD.  He continues to tolerate medical therapy without issue.  Denies chest pain, dizziness, palpitations, syncope, near syncope.  Denies PND, orthopnea, edema. Patient does complain of reduced exercise tolerance over the last few months.  He also reports significant orthopedic pain secondary to arthritis which limits his activity.  Past Medical History:  Diagnosis Date   Arthritis    Asthma    Coronary artery disease    COVID    Diabetes mellitus    type 2, insulin dependent for 30+ years   History of kidney stones 11/2012   Hyperlipemia    Hypertension    IDDM (insulin dependent diabetes mellitus)    Shortness of breath    Stroke ([place])    hx TIA   Family History  Problem Relation Age of Onset   Emphysema Mother    COPD Mother    Alzheimer's disease Father    Asthma Paternal Aunt    Heart attack Paternal Grandfather    Past Surgical History:  Procedure Laterality Date   ANGIOPLASTY  04/06/2011   x3    CARDIAC CATHETERIZATION     CARDIAC CATHETERIZATION N/A 09/16/2015   Procedure: Left Heart Cath and Coronary Angiography;  Surgeon: Adrian Prows, MD;  Location: [facility];  Service: Cardiovascular;  Laterality: N/A;   cataracts     CHOLECYSTECTOMY     HERNIA REPAIR     x2    KNEE ARTHROSCOPY     LEFT HEART CATHETERIZATION WITH CORONARY ANGIOGRAM N/A 03/08/2011   Procedure: LEFT HEART CATHETERIZATION WITH CORONARY ANGIOGRAM;  Surgeon: Laverda Page, MD;  Location: [facility] CATH LAB;  Service: Cardiovascular;  Laterality: N/A;   PERCUTANEOUS CORONARY STENT INTERVENTION (PCI-S) N/A 04/06/2011   Procedure: PERCUTANEOUS CORONARY STENT INTERVENTION (PCI-S);  Surgeon: Laverda Page, MD;  Location: [facility] CATH LAB;  Service: Cardiovascular;  Laterality: N/A;   Social History   Tobacco Use   Smoking status: Never   Smokeless tobacco: Never  Substance Use Topics   Alcohol use: Yes    Comment: occasionally   Marital status: Married  ROS  Review of Systems  Constitutional: Positive for malaise/fatigue. Negative for weight gain.  Cardiovascular:  Positive for dyspnea on exertion. Negative for chest pain, claudication, leg swelling, near-syncope, orthopnea, palpitations, paroxysmal nocturnal dyspnea and syncope.  Musculoskeletal:  Positive for joint pain (arthritis).  Neurological:  Negative for dizziness.  Objective  Blood pressure 135/82, pulse 77, temperature 98 F (36.7 C), resp. rate 16, height 5\' 10"  (1.778 m), weight 208 lb (94.3 kg), SpO2 96 %.  Vitals with BMI 02/09/2021 10/21/2020 07/10/2020  Height 5\' 10"  5\' 10"  5' 10.5"  Weight 208 lbs 207 lbs 3 oz 207 lbs  BMI 29.84 40.98 11.91  Systolic 478 295 621  Diastolic 82  73 75  Pulse 77 73 73     Physical Exam Vitals reviewed.  HENT:     Head: Normocephalic and atraumatic.  Cardiovascular:     Rate and Rhythm: Normal rate and regular rhythm.     Pulses: Intact distal pulses.          Carotid pulses are 2+ on the right side and 2+ on the left side.      Radial pulses are 2+ on the right side and 2+ on the left side.       Femoral pulses are 2+ on the right side and 2+ on the left side.      Popliteal pulses are 2+ on the right side and 2+ on the left side.       Dorsalis pedis pulses are 0 on the right  side and 0 on the left side.       Posterior tibial pulses are 1+ on the right side and 1+ on the left side.     Heart sounds: S1 normal and S2 normal. No murmur heard.   No gallop.  Pulmonary:     Effort: Pulmonary effort is normal. No respiratory distress.     Breath sounds: No wheezing, rhonchi or rales.  Musculoskeletal:     Right lower leg: No edema.     Left lower leg: No edema.  Neurological:     Mental Status: He is alert.   Laboratory examination:   Recent Labs    04/01/20 1223 07/10/20 0000 10/21/20 1447  NA 141 143 138  K 4.8 4.9 4.8  CL 104 104 101  CO2 25 27 30   GLUCOSE 130* 105* 128*  BUN 14 17 15   CREATININE 1.05 0.80 0.86  CALCIUM 9.4 9.7 9.4  GFRNONAA 71 90  --   GFRAA 82 104  --    CrCl cannot be calculated (Patient's most recent lab result is older than the maximum 21 days allowed.).  CMP Latest Ref Rng & Units 10/21/2020 07/10/2020 07/10/2020  Glucose 65 - 99 mg/dL 128(H) - 105(H)  BUN 7 - 25 mg/dL 15 - 17  Creatinine 0.70 - 1.28 mg/dL 0.86 - 0.80  Sodium 135 - 146 mmol/L 138 - 143  Potassium 3.5 - 5.3 mmol/L 4.8 - 4.9  Chloride 98 - 110 mmol/L 101 - 104  CO2 20 - 32 mmol/L 30 - 27  Calcium 8.6 - 10.3 mg/dL 9.4 - 9.7  Total Protein 6.1 - 8.1 g/dL 6.5 6.8 6.8  Total Bilirubin 0.2 - 1.2 mg/dL 0.4 - 0.4  Alkaline Phos 40 - 115 U/L - - -  AST 10 - 35 U/L 19 - 20  ALT 9 - 46 U/L 15 - 23   CBC Latest Ref Rng & Units 10/21/2020 07/10/2020 04/01/2020  WBC 3.8 - 10.8 Thousand/uL 7.1 7.0 7.3  Hemoglobin 13.2 - 17.1 g/dL 15.6 15.6 16.2  Hematocrit 38.5 - 50.0 % 48.9 47.0 48.6  Platelets 140 - 400 Thousand/uL 210 201 216   Lipid Panel     Component Value Date/Time   CHOL 115 06/23/2016 0617   TRIG 80 06/23/2016 0617   HDL 40 (L) 06/23/2016 0617   CHOLHDL 2.9 06/23/2016 0617   VLDL 16 06/23/2016 0617   LDLCALC 59 06/23/2016 0617   HEMOGLOBIN A1C Lab Results  Component Value Date   HGBA1C 5.8 (H) 06/23/2016   MPG 120 06/23/2016   TSH No results for  input(s): TSH in the last 8760 hours.   External Labs:  05/23/2020: Hgb 16, HCT 50, platelet 2 6 BUN 16, creatinine 0.89, sodium 137, potassium 4.8, Total cholesterol 119, HDL 46, LDL 47, triglycerides 153  12/04/2019: HDL 42, LDL 52, total cholesterol 110, triglycerides 78  05/26/2017: TSH 3.23  Allergies   Allergies  Allergen Reactions   Avandia [Rosiglitazone Maleate] Swelling    Lower extremities     Medications Prior to Visit:   Outpatient Medications Prior to Visit  Medication Sig Dispense Refill   acetaminophen (TYLENOL) 650 MG CR tablet Take by mouth 2 (two) times daily.     Ascorbic Acid (VITAMIN C) 1000 MG tablet Take 1,000 mg by mouth daily.     aspirin EC 81 MG tablet Take 81 mg by mouth at bedtime.     carvedilol (COREG) 12.5 MG tablet Take 1 tablet (12.5 mg total) by mouth 2 (two) times daily with a meal. 180 tablet 1   chlorpheniramine (CHLOR-TRIMETON) 4 MG tablet Take 4 mg by mouth at bedtime.     Cholecalciferol (VITAMIN D) 2000 units CAPS Take 2,000 Units by mouth daily.     CINNAMON PO Take 2,000 mg by mouth 2 (two) times daily.     docusate sodium (COLACE) 100 MG capsule Take 100 mg by mouth daily.     DULoxetine (CYMBALTA) 30 MG capsule Take 30 mg by mouth at bedtime.     FARXIGA 10 MG TABS tablet      insulin NPH (HUMULIN N,NOVOLIN N) 100 UNIT/ML injection Inject 5-25 Units into the skin See admin instructions. 10 units in morning  20 units bedtime     insulin regular (NOVOLIN R,HUMULIN R) 100 units/mL injection Inject 20 Units into the skin 2 (two) times daily before a meal.      metFORMIN (GLUCOPHAGE-XR) 500 MG 24 hr tablet Take 2 tablets (1,000 mg total) by mouth 2 (two) times daily.     olmesartan (BENICAR) 20 MG tablet TAKE 1 TABLET BY MOUTH EVERY DAY 90 tablet 1   ONETOUCH VERIO test strip USE TO TEST BLOOD SUGAR 4 TIMES DAILY     rosuvastatin (CRESTOR) 20 MG tablet Take 10 mg by mouth daily.     sulfaSALAzine (AZULFIDINE) 500 MG tablet Take 2 tablets  (1,000 mg total) by mouth twice daily. 360 tablet 0   traMADol (ULTRAM) 50 MG tablet Take by mouth every 6 (six) hours as needed. Prescribed by PCP.     nitroGLYCERIN (NITROSTAT) 0.4 MG SL tablet Place 1 tablet (0.4 mg total) under the tongue every 5 (five) minutes as needed for up to 25 days for chest pain. 25 tablet 3   METAMUCIL FIBER PO Take by mouth daily. (Patient not taking: Reported on 10/21/2020)     methocarbamol (ROBAXIN) 500 MG tablet Take 1 tablet (500 mg total) by mouth 2 (two) times daily as needed (for muscle spasm). (Patient not taking: Reported on 10/21/2020) 60 tablet 2   naproxen sodium (ANAPROX) 220 MG tablet Take 220 mg by mouth 2 (two) times daily with a meal. (Patient not taking: Reported on 10/21/2020)     No facility-administered medications prior to visit.   Final Medications at End of Visit    Current Meds  Medication Sig   acetaminophen (TYLENOL) 650 MG CR tablet Take by mouth 2 (two) times daily.   Ascorbic Acid (VITAMIN C) 1000 MG tablet Take 1,000 mg by mouth daily.   aspirin EC 81 MG tablet Take 81 mg by mouth at bedtime.   carvedilol (COREG) 12.5 MG tablet Take 1  tablet (12.5 mg total) by mouth 2 (two) times daily with a meal.   chlorpheniramine (CHLOR-TRIMETON) 4 MG tablet Take 4 mg by mouth at bedtime.   Cholecalciferol (VITAMIN D) 2000 units CAPS Take 2,000 Units by mouth daily.   CINNAMON PO Take 2,000 mg by mouth 2 (two) times daily.   docusate sodium (COLACE) 100 MG capsule Take 100 mg by mouth daily.   DULoxetine (CYMBALTA) 30 MG capsule Take 30 mg by mouth at bedtime.   FARXIGA 10 MG TABS tablet    insulin NPH (HUMULIN N,NOVOLIN N) 100 UNIT/ML injection Inject 5-25 Units into the skin See admin instructions. 10 units in morning  20 units bedtime   insulin regular (NOVOLIN R,HUMULIN R) 100 units/mL injection Inject 20 Units into the skin 2 (two) times daily before a meal.    metFORMIN (GLUCOPHAGE-XR) 500 MG 24 hr tablet Take 2 tablets (1,000 mg total)  by mouth 2 (two) times daily.   olmesartan (BENICAR) 20 MG tablet TAKE 1 TABLET BY MOUTH EVERY DAY   ONETOUCH VERIO test strip USE TO TEST BLOOD SUGAR 4 TIMES DAILY   rosuvastatin (CRESTOR) 20 MG tablet Take 10 mg by mouth daily.   sulfaSALAzine (AZULFIDINE) 500 MG tablet Take 2 tablets (1,000 mg total) by mouth twice daily.   traMADol (ULTRAM) 50 MG tablet Take by mouth every 6 (six) hours as needed. Prescribed by PCP.   Radiology:  No results found.  Cardiac Studies:   Coronary angiogram 09/16/2015:  Mid LAD 3.0 x 23 mm and 2.75 x 12 mm Xience placed 05/25/08, ramus intermediate 3.5 x 15 mm integrity DES placed 03/08/11 and proximal and mid circumflex 2.75 x 22 2 mm resolute placed 04/06/2011 widely patent, occluded PL branch of RCA with collaterals from the circumflex coronary artery.  LVEF normal at 55%.  Lower extremity arterial duplex 03/17/2017: No hemodynamically significant stenoses are identified in the  lower extremity arterial system. This exam reveals mildly decreased perfusion of the right lower extremity and normal perfusion of the left lower extremity (ABI).  RABI 0.86, LABI 1.00. Triphasic waveform(normal) noted at both ankle. Study may suggest minimal diffuse disease.  Echocardiogram   [08/26/2015]: Left ventricle cavity is normal in size. Moderate concentric hypertrophy of the left ventricle. Borderline global hypokinesis. Doppler evidence of grade I (impaired) diastolic dysfunction. Left ventricle regional wall motion findings: No wall motion abnormalities. Visual EF is 45-50%. Calculated EF 45%. Left atrial cavity is mildly dilated. Trace mitral regurgitation. Trace tricuspid regurgitation. Unable to estimate PA pressure due to absence/minimal TR signal. Compared to the study done on 02/03/2011, EF previously normal.  Nuclear stress test   [08/22/2015]:  1. Resting EKG demonstrated normal sinus rhythm, normal axis, poor R-wave progression, low voltage complexes. Stress EKG  is nondiagnostic for ischemia as its pharmacologic stress test. Stress symptoms included dyspnea. 2. Perfusion imaging study demonstrates a small sized mild lateral wall ischemia noted towards the apex. In addition there is mild anterior wall thinning extending from the mid ventricle towards the apex. The left ventricle is normal in size with rest and stress images. Left ventricular systolic function calculated by QGS was moderately depressed at 37% with global hypokinesis. In a patient with knows severe 3 vessel CAD and PTCA, this represents a high risk study.  Nocturnal Oximetry 09/30/2015:  02 saturation less than 89% for 4 hours and less than 88% for 3 hours, overall low 79%. Patient qualifies for nocturnal oxygen supplementation in Medicare group 1. Referred for sleep study.  EKG  02/09/2021: Sinus rhythm at a rate of 77 bpm.  Left atrial enlargement.  Left axis, left anterior fascicular block.  Poor R wave progression, cannot exclude anteroseptal infarct old.  Nonspecific T wave abnormality.  Incomplete right bundle branch block.  02/08/2020: Sinus rhythm at a rate of 82 bpm, left atrial enlargement, normal axis.  Poor R wave progression, cannot exclude anteroseptal infarct old.  Nonspecific T wave abnormality.  Compared to EKG 03/05/2019, PRWP new.  03/05/2019: Normal sinus rhythm at rate of 69 bpm, normal axis, low voltage complexes, nonspecific T abnormality.No significant change from EKG 03/02/2018.  Assessment     ICD-10-CM   1. Coronary artery disease involving native coronary artery of native heart without angina pectoris  I25.10 EKG 12-Lead    PCV MYOCARDIAL PERFUSION WITH LEXISCAN    PCV ECHOCARDIOGRAM COMPLETE    2. Primary hypertension  I10     3. Dyspnea on exertion  R06.09 PCV MYOCARDIAL PERFUSION WITH LEXISCAN    PCV ECHOCARDIOGRAM COMPLETE       No orders of the defined types were placed in this encounter.  Medications Discontinued During This Encounter  Medication  Reason   METAMUCIL FIBER PO    methocarbamol (ROBAXIN) 500 MG tablet    naproxen sodium (ANAPROX) 220 MG tablet    Recommendations:    Alan Wang  is a 70 y.o. Caucasian male with history of known coronary artery disease. He has had coronary angioplasty to his LAD, circumflex and also ramus intermediate vessels.  He has history of diabetes mellitus with diabetic retinopathy, hypertension and hyperlipidemia.  Last coronary angiography on 09/16/2015 after a abnormal stress test revealing flush occluded moderate sized PL branch of RCA but had excellent collaterals.  Patient presents for annual follow-up of CAD, hypertension, hyperlipidemia.  I personally reviewed external labs, lipids are under excellent control.  Blood pressure is well controlled.  Patient continues to tolerate medical therapy without issue.  He does report dyspnea on exertion and reduced exercise tolerance.  This is likely multifactorial including deconditioning as he has been unable to exercise lately due to orthopedic pain.  Given patient's dyspnea and reduced exercise tolerance with history of CAD recommend repeat echocardiogram and stress test as it has been 5 years since last evaluation.   Patient is otherwise stable from a cardiovascular standpoint.  If echocardiogram and stress test are without significant abnormalities we will plan to follow-up in 1 year, sooner if needed. No changes were made today.    Alethia Berthold, PA-C 02/09/2021, 1:29 PM Office: [phone]

## 2021-02-10 ENCOUNTER — Other Ambulatory Visit: Payer: Self-pay | Admitting: Physician Assistant

## 2021-02-10 LAB — CBC WITH DIFFERENTIAL/PLATELET
Absolute Monocytes: 1168 cells/uL — ABNORMAL HIGH (ref 200–950)
Basophils Absolute: 84 cells/uL (ref 0–200)
Basophils Relative: 1 %
Eosinophils Absolute: 764 cells/uL — ABNORMAL HIGH (ref 15–500)
Eosinophils Relative: 9.1 %
HCT: 50.1 % — ABNORMAL HIGH (ref 38.5–50.0)
Hemoglobin: 16.3 g/dL (ref 13.2–17.1)
Lymphs Abs: 2780 cells/uL (ref 850–3900)
MCH: 29.4 pg (ref 27.0–33.0)
MCHC: 32.5 g/dL (ref 32.0–36.0)
MCV: 90.4 fL (ref 80.0–100.0)
MPV: 9.5 fL (ref 7.5–12.5)
Monocytes Relative: 13.9 %
Neutro Abs: 3604 cells/uL (ref 1500–7800)
Neutrophils Relative %: 42.9 %
Platelets: 229 10*3/uL (ref 140–400)
RBC: 5.54 10*6/uL (ref 4.20–5.80)
RDW: 13.5 % (ref 11.0–15.0)
Total Lymphocyte: 33.1 %
WBC: 8.4 10*3/uL (ref 3.8–10.8)

## 2021-02-10 LAB — COMPLETE METABOLIC PANEL WITH GFR
AG Ratio: 1.6 (calc) (ref 1.0–2.5)
ALT: 12 U/L (ref 9–46)
AST: 17 U/L (ref 10–35)
Albumin: 4.1 g/dL (ref 3.6–5.1)
Alkaline phosphatase (APISO): 80 U/L (ref 35–144)
BUN: 17 mg/dL (ref 7–25)
CO2: 30 mmol/L (ref 20–32)
Calcium: 9.8 mg/dL (ref 8.6–10.3)
Chloride: 103 mmol/L (ref 98–110)
Creat: 0.88 mg/dL (ref 0.70–1.28)
Globulin: 2.6 g/dL (calc) (ref 1.9–3.7)
Glucose, Bld: 98 mg/dL (ref 65–99)
Potassium: 5.1 mmol/L (ref 3.5–5.3)
Sodium: 141 mmol/L (ref 135–146)
Total Bilirubin: 0.4 mg/dL (ref 0.2–1.2)
Total Protein: 6.7 g/dL (ref 6.1–8.1)
eGFR: 91 mL/min/{1.73_m2} (ref >=60)

## 2021-02-10 NOTE — Progress Notes (Signed)
Monocyte count is elevated and increasing.  Rest the CBC is a stable.  CMP is normal.  Please forward results to his hematologist.

## 2021-02-12 ENCOUNTER — Other Ambulatory Visit: Payer: Self-pay | Admitting: Student

## 2021-02-12 ENCOUNTER — Encounter: Payer: Self-pay | Admitting: Rheumatology

## 2021-02-12 ENCOUNTER — Ambulatory Visit: Payer: Self-pay

## 2021-02-12 ENCOUNTER — Other Ambulatory Visit: Payer: Self-pay

## 2021-02-12 ENCOUNTER — Ambulatory Visit: Payer: Medicare HMO | Admitting: Rheumatology

## 2021-02-12 VITALS — BP 123/74 | HR 82 | Ht 70.0 in | Wt 207.0 lb

## 2021-02-12 DIAGNOSIS — M19041 Primary osteoarthritis, right hand: Secondary | ICD-10-CM

## 2021-02-12 DIAGNOSIS — M25511 Pain in right shoulder: Secondary | ICD-10-CM | POA: Diagnosis not present

## 2021-02-12 DIAGNOSIS — G8929 Other chronic pain: Secondary | ICD-10-CM

## 2021-02-12 DIAGNOSIS — Z8673 Personal history of transient ischemic attack (TIA), and cerebral infarction without residual deficits: Secondary | ICD-10-CM

## 2021-02-12 DIAGNOSIS — Z8639 Personal history of other endocrine, nutritional and metabolic disease: Secondary | ICD-10-CM | POA: Diagnosis not present

## 2021-02-12 DIAGNOSIS — M19042 Primary osteoarthritis, left hand: Secondary | ICD-10-CM

## 2021-02-12 DIAGNOSIS — Z79899 Other long term (current) drug therapy: Secondary | ICD-10-CM

## 2021-02-12 DIAGNOSIS — Z8679 Personal history of other diseases of the circulatory system: Secondary | ICD-10-CM | POA: Diagnosis not present

## 2021-02-12 DIAGNOSIS — Z9889 Other specified postprocedural states: Secondary | ICD-10-CM | POA: Diagnosis not present

## 2021-02-12 DIAGNOSIS — D472 Monoclonal gammopathy: Secondary | ICD-10-CM

## 2021-02-12 DIAGNOSIS — M170 Bilateral primary osteoarthritis of knee: Secondary | ICD-10-CM | POA: Diagnosis not present

## 2021-02-12 DIAGNOSIS — M1990 Unspecified osteoarthritis, unspecified site: Secondary | ICD-10-CM | POA: Diagnosis not present

## 2021-02-12 MED ORDER — LIDOCAINE HCL 1 % IJ SOLN
1.5000 mL | INTRAMUSCULAR | Status: AC | PRN
  Administered 2021-02-12: 1.5 mL

## 2021-02-12 MED ORDER — TRIAMCINOLONE ACETONIDE 40 MG/ML IJ SUSP
40.0000 mg | INTRAMUSCULAR | Status: AC | PRN
  Administered 2021-02-12: 40 mg via INTRA_ARTICULAR

## 2021-02-12 NOTE — Patient Instructions (Signed)
Standing Labs We placed an order today for your standing lab work.   Please have your standing labs drawn in March and every 3 months  If possible, please have your labs drawn 2 weeks prior to your appointment so that the provider can discuss your results at your appointment.  Please note that you may see your imaging and lab results in [place] before we have reviewed them. We may be awaiting multiple results to interpret others before contacting you. Please allow our office up to 72 hours to thoroughly review all of the results before contacting the office for clarification of your results.  We have open lab daily: Monday through Thursday from 1:30-4:30 PM and Friday from 1:30-4:00 PM at the office of Dr. Bo Merino, [facility].   Please be advised, all patients with office appointments requiring lab work will take precedent over walk-in lab work.  If possible, please come for your lab work on Monday and Friday afternoons, as you may experience shorter wait times. The office is located at [address] [place], [place], [place] [address] No appointment is necessary.   Labs are drawn by [facility]. Please bring your co-pay at the time of your lab draw.  You may receive a bill from [facility] for your lab work.  If you wish to have your labs drawn at another location, please call the office 24 hours in advance to send orders.  If you have any questions regarding directions or hours of operation,  please call [phone].   As a reminder, please drink plenty of water prior to coming for your lab work. Thanks!  Information  Vaccines You are taking a medication(s) that can suppress your immune system.  The following immunizations are recommended: Flu annually Covid-19  Td/Tdap (tetanus, diphtheria, pertussis) every 10 years Pneumonia (Prevnar 15 then Pneumovax 23 at least 1 year apart.  Alternatively, can take Prevnar 20 without needing additional dose) Shingrix: 2 doses  from 4 weeks to 6 months apart  Please check with your PCP to make sure you are up to date.   If you have signs or symptoms of an infection or start antibiotics: First, call your PCP for workup of your infection. Hold your medication through the infection, until you complete your antibiotics, and until symptoms resolve if you take the following: Injectable medication (Actemra, Benlysta, Cimzia, Cosentyx, Enbrel, Humira, Kevzara, Orencia, Remicade, Simponi, Stelara, Taltz, Tremfya) Methotrexate Leflunomide (Arava) Mycophenolate (Cellcept) Morrie Sheldon, Olumiant, or Rinvoq

## 2021-02-16 ENCOUNTER — Other Ambulatory Visit: Payer: Medicare HMO

## 2021-02-18 ENCOUNTER — Other Ambulatory Visit: Payer: Medicare HMO

## 2021-02-23 ENCOUNTER — Ambulatory Visit: Payer: Medicare HMO

## 2021-02-23 ENCOUNTER — Other Ambulatory Visit: Payer: Self-pay

## 2021-02-23 DIAGNOSIS — I2510 Atherosclerotic heart disease of native coronary artery without angina pectoris: Secondary | ICD-10-CM

## 2021-02-23 DIAGNOSIS — R0609 Other forms of dyspnea: Secondary | ICD-10-CM | POA: Diagnosis not present

## 2021-02-24 DIAGNOSIS — E7800 Pure hypercholesterolemia, unspecified: Secondary | ICD-10-CM | POA: Diagnosis not present

## 2021-02-24 DIAGNOSIS — M2550 Pain in unspecified joint: Secondary | ICD-10-CM | POA: Diagnosis not present

## 2021-02-24 DIAGNOSIS — I10 Essential (primary) hypertension: Secondary | ICD-10-CM | POA: Diagnosis not present

## 2021-02-24 DIAGNOSIS — G609 Hereditary and idiopathic neuropathy, unspecified: Secondary | ICD-10-CM | POA: Diagnosis not present

## 2021-02-24 DIAGNOSIS — R5383 Other fatigue: Secondary | ICD-10-CM | POA: Diagnosis not present

## 2021-02-24 DIAGNOSIS — E1165 Type 2 diabetes mellitus with hyperglycemia: Secondary | ICD-10-CM | POA: Diagnosis not present

## 2021-02-24 DIAGNOSIS — D721 Eosinophilia, unspecified: Secondary | ICD-10-CM | POA: Diagnosis not present

## 2021-02-24 DIAGNOSIS — E11319 Type 2 diabetes mellitus with unspecified diabetic retinopathy without macular edema: Secondary | ICD-10-CM | POA: Diagnosis not present

## 2021-02-24 LAB — PCV MYOCARDIAL PERFUSION WITH LEXISCAN: ST Depression (mm): 0 mm

## 2021-02-26 DIAGNOSIS — M179 Osteoarthritis of knee, unspecified: Secondary | ICD-10-CM | POA: Diagnosis not present

## 2021-02-26 DIAGNOSIS — N400 Enlarged prostate without lower urinary tract symptoms: Secondary | ICD-10-CM | POA: Diagnosis not present

## 2021-02-26 DIAGNOSIS — E785 Hyperlipidemia, unspecified: Secondary | ICD-10-CM | POA: Diagnosis not present

## 2021-02-26 DIAGNOSIS — I2510 Atherosclerotic heart disease of native coronary artery without angina pectoris: Secondary | ICD-10-CM | POA: Diagnosis not present

## 2021-02-26 DIAGNOSIS — M19049 Primary osteoarthritis, unspecified hand: Secondary | ICD-10-CM | POA: Diagnosis not present

## 2021-02-26 DIAGNOSIS — E119 Type 2 diabetes mellitus without complications: Secondary | ICD-10-CM | POA: Diagnosis not present

## 2021-02-26 DIAGNOSIS — I10 Essential (primary) hypertension: Secondary | ICD-10-CM | POA: Diagnosis not present

## 2021-02-26 DIAGNOSIS — N401 Enlarged prostate with lower urinary tract symptoms: Secondary | ICD-10-CM | POA: Diagnosis not present

## 2021-02-26 DIAGNOSIS — I252 Old myocardial infarction: Secondary | ICD-10-CM | POA: Diagnosis not present

## 2021-02-26 DIAGNOSIS — M150 Primary generalized (osteo)arthritis: Secondary | ICD-10-CM | POA: Diagnosis not present

## 2021-03-05 ENCOUNTER — Other Ambulatory Visit: Payer: Self-pay | Admitting: Physician Assistant

## 2021-03-05 NOTE — Telephone Encounter (Signed)
Next Visit: 04/07/2021  Last Visit: 02/12/2021  Last Fill: 12/11/2020  DX: Inflammatory arthritis  Current Dose per office note 02/12/2021: sulfasalazine 500 mg 2 tablets twice daily  Labs: 02/09/2021 Monocyte count is elevated and increasing.  Rest the CBC is a stable.  CMP is normal.  Okay to refill SSZ?

## 2021-03-24 NOTE — Progress Notes (Deleted)
Office Visit Note  Patient: Alan Wang             Date of Birth: 10/30/1950           MRN: [id number]             PCP: Aura Dials, MD Referring: Aura Dials, MD Visit Date: 04/07/2021 Occupation: @GUAROCC @  Subjective:  No chief complaint on file.   History of Present Illness: Alan Wang is a 71 y.o. male ***   Activities of Daily Living:  Patient reports morning stiffness for *** {minute/hour:19697}.   Patient {ACTIONS;DENIES/REPORTS:21021675::"Denies"} nocturnal pain.  Difficulty dressing/grooming: {ACTIONS;DENIES/REPORTS:21021675::"Denies"} Difficulty climbing stairs: {ACTIONS;DENIES/REPORTS:21021675::"Denies"} Difficulty getting out of chair: {ACTIONS;DENIES/REPORTS:21021675::"Denies"} Difficulty using hands for taps, buttons, cutlery, and/or writing: {ACTIONS;DENIES/REPORTS:21021675::"Denies"}  No Rheumatology ROS completed.   PMFS History:  Patient Active Problem List   Diagnosis Date Noted   TIA (transient ischemic attack) 06/22/2016   Type 2 diabetes mellitus without complication ([place]) 44/81/8563   Primary osteoarthritis of both hands 06/03/2016   Primary osteoarthritis of both knees 06/03/2016   Generalized pain 06/03/2016   Memory difficulties 06/03/2016   History of diabetes mellitus 06/03/2016   History of coronary artery disease 06/03/2016   History of hypertension 06/03/2016   History of hypercholesterolemia 06/03/2016   High risk medication use 06/03/2016   History of high cholesterol 06/03/2016   ANA positive 06/03/2016   Inflammatory arthritis 10/29/2015   Vitamin D deficiency 10/29/2015   Fatigue 09/24/2015   MGUS (monoclonal gammopathy of unknown significance) 09/22/2015   Vertigo 10/31/2013   Chronic cough 11/16/2012   CAD (coronary artery disease), native coronary artery 03/08/2011   Postsurgical percutaneous transluminal coronary angioplasty (PTCA) status 03/08/2011   Type II or unspecified type diabetes mellitus with other  coma, not stated as uncontrolled 03/08/2011   Hypertension associated with diabetes ([place]) 03/08/2011   Shortness of breath 03/08/2011   Mixed hyperlipidemia 03/08/2011    Past Medical History:  Diagnosis Date   Arthritis    Asthma    Coronary artery disease    COVID    Diabetes mellitus    type 2, insulin dependent for 30+ years   History of kidney stones 11/2012   Hyperlipemia    Hypertension    IDDM (insulin dependent diabetes mellitus)    Shortness of breath    Stroke ([place])    hx TIA    Family History  Problem Relation Age of Onset   Emphysema Mother    COPD Mother    Alzheimer's disease Father    Asthma Paternal Aunt    Heart attack Paternal Grandfather    Past Surgical History:  Procedure Laterality Date   ANGIOPLASTY  04/06/2011   x3    CARDIAC CATHETERIZATION     CARDIAC CATHETERIZATION N/A 09/16/2015   Procedure: Left Heart Cath and Coronary Angiography;  Surgeon: Adrian Prows, MD;  Location: [facility];  Service: Cardiovascular;  Laterality: N/A;   cataracts     CHOLECYSTECTOMY     HERNIA REPAIR     x2   KNEE ARTHROSCOPY     LEFT HEART CATHETERIZATION WITH CORONARY ANGIOGRAM N/A 03/08/2011   Procedure: LEFT HEART CATHETERIZATION WITH CORONARY ANGIOGRAM;  Surgeon: Laverda Page, MD;  Location: [facility] CATH LAB;  Service: Cardiovascular;  Laterality: N/A;   PERCUTANEOUS CORONARY STENT INTERVENTION (PCI-S) N/A 04/06/2011   Procedure: PERCUTANEOUS CORONARY STENT INTERVENTION (PCI-S);  Surgeon: Laverda Page, MD;  Location: [facility] CATH LAB;  Service: Cardiovascular;  Laterality: N/A;   Social  History   Social History Narrative   Patient lives at home with his spouse.   Caffeine Use: occasionally   Immunization History  Administered Date(s) Administered   PFIZER(Purple Top)SARS-COV-2 Vaccination 04/02/2019, 04/23/2019   Pneumococcal Polysaccharide-23 12/14/2011     Objective: Vital Signs: There were no vitals taken for this visit.   Physical Exam    Musculoskeletal Exam: ***  CDAI Exam: CDAI Score: -- Patient Global: --; Provider Global: -- Swollen: --; Tender: -- Joint Exam 04/07/2021   No joint exam has been documented for this visit   There is currently no information documented on the homunculus. Go to the Rheumatology activity and complete the homunculus joint exam.  Investigation: No additional findings.  Imaging: PCV ECHOCARDIOGRAM COMPLETE  Result Date: 03/02/2021 Echocardiogram 02/23/2021: Left ventricle cavity is normal in size. Mild concentric hypertrophy of the left ventricle. Normal global wall motion. Normal LV systolic function with visual EF 50-55%. Doppler evidence of grade I (impaired) diastolic dysfunction, normal LAP. No significant valvular abnormality. IVC not seen. Compared to previous study in 2017, LVEF is imorived from 45-50%.   PCV MYOCARDIAL PERFUSION WITH LEXISCAN  Result Date: 02/24/2021   [facility] Adam (with Mod Bruce protocol) Nuclear stress test 02/23/2021: Nondiagnostic ECG stress. Normal LV size in both rest and stress images. Myocardial perfusion is normal with mild diaphragmatic attenuation. A small inferior wall scar cannot be completely excluded.  Moderate global hypokinesis of the left ventricle. Stress LV EF: 24%.  High risk study due to low LVEF. Compared to the study done in 2017, mild lateral wall ischemia not present and LVEF was 37%. Findings may represent non ischemic cardiomyopathy.    Recent Labs: Lab Results  Component Value Date   WBC 8.4 02/09/2021   HGB 16.3 02/09/2021   PLT 229 02/09/2021   NA 141 02/09/2021   K 5.1 02/09/2021   CL 103 02/09/2021   CO2 30 02/09/2021   GLUCOSE 98 02/09/2021   BUN 17 02/09/2021   CREATININE 0.88 02/09/2021   BILITOT 0.4 02/09/2021   ALKPHOS 76 10/26/2016   AST 17 02/09/2021   ALT 12 02/09/2021   PROT 6.7 02/09/2021   ALBUMIN 4.2 10/26/2016   CALCIUM 9.8 02/09/2021   GFRAA 104 07/10/2020    Speciality Comments: No specialty  comments available.  Procedures:  No procedures performed Allergies: Avandia [rosiglitazone maleate]   Assessment / Plan:     Visit Diagnoses: No diagnosis found.  Orders: No orders of the defined types were placed in this encounter.  No orders of the defined types were placed in this encounter.   Face-to-face time spent with patient was *** minutes. Greater than 50% of time was spent in counseling and coordination of care.  Follow-Up Instructions: No follow-ups on file.   Earnestine Mealing, CMA  Note - This record has been created using Editor, commissioning.  Chart creation errors have been sought, but may not always  have been located. Such creation errors do not reflect on  the standard of medical care.

## 2021-04-07 ENCOUNTER — Ambulatory Visit: Payer: Medicare HMO | Admitting: Physician Assistant

## 2021-04-07 DIAGNOSIS — M1990 Unspecified osteoarthritis, unspecified site: Secondary | ICD-10-CM

## 2021-04-07 DIAGNOSIS — D472 Monoclonal gammopathy: Secondary | ICD-10-CM

## 2021-04-07 DIAGNOSIS — G8929 Other chronic pain: Secondary | ICD-10-CM

## 2021-04-07 DIAGNOSIS — M19041 Primary osteoarthritis, right hand: Secondary | ICD-10-CM

## 2021-04-07 DIAGNOSIS — Z8679 Personal history of other diseases of the circulatory system: Secondary | ICD-10-CM

## 2021-04-07 DIAGNOSIS — Z8673 Personal history of transient ischemic attack (TIA), and cerebral infarction without residual deficits: Secondary | ICD-10-CM

## 2021-04-07 DIAGNOSIS — Z8639 Personal history of other endocrine, nutritional and metabolic disease: Secondary | ICD-10-CM

## 2021-04-07 DIAGNOSIS — Z9889 Other specified postprocedural states: Secondary | ICD-10-CM

## 2021-04-07 DIAGNOSIS — M170 Bilateral primary osteoarthritis of knee: Secondary | ICD-10-CM

## 2021-04-07 DIAGNOSIS — Z79899 Other long term (current) drug therapy: Secondary | ICD-10-CM

## 2021-04-15 DIAGNOSIS — M25511 Pain in right shoulder: Secondary | ICD-10-CM | POA: Diagnosis not present

## 2021-05-03 ENCOUNTER — Other Ambulatory Visit: Payer: Self-pay | Admitting: Student

## 2021-05-03 DIAGNOSIS — G459 Transient cerebral ischemic attack, unspecified: Secondary | ICD-10-CM

## 2021-05-11 DIAGNOSIS — I252 Old myocardial infarction: Secondary | ICD-10-CM | POA: Diagnosis not present

## 2021-05-11 DIAGNOSIS — I10 Essential (primary) hypertension: Secondary | ICD-10-CM | POA: Diagnosis not present

## 2021-05-11 DIAGNOSIS — E119 Type 2 diabetes mellitus without complications: Secondary | ICD-10-CM | POA: Diagnosis not present

## 2021-05-11 DIAGNOSIS — I2510 Atherosclerotic heart disease of native coronary artery without angina pectoris: Secondary | ICD-10-CM | POA: Diagnosis not present

## 2021-05-25 DIAGNOSIS — D721 Eosinophilia, unspecified: Secondary | ICD-10-CM | POA: Diagnosis not present

## 2021-05-25 DIAGNOSIS — E11319 Type 2 diabetes mellitus with unspecified diabetic retinopathy without macular edema: Secondary | ICD-10-CM | POA: Diagnosis not present

## 2021-05-25 DIAGNOSIS — I10 Essential (primary) hypertension: Secondary | ICD-10-CM | POA: Diagnosis not present

## 2021-05-25 DIAGNOSIS — E7800 Pure hypercholesterolemia, unspecified: Secondary | ICD-10-CM | POA: Diagnosis not present

## 2021-05-25 DIAGNOSIS — E1165 Type 2 diabetes mellitus with hyperglycemia: Secondary | ICD-10-CM | POA: Diagnosis not present

## 2021-05-25 DIAGNOSIS — G609 Hereditary and idiopathic neuropathy, unspecified: Secondary | ICD-10-CM | POA: Diagnosis not present

## 2021-06-03 DIAGNOSIS — E113293 Type 2 diabetes mellitus with mild nonproliferative diabetic retinopathy without macular edema, bilateral: Secondary | ICD-10-CM | POA: Diagnosis not present

## 2021-06-03 DIAGNOSIS — I10 Essential (primary) hypertension: Secondary | ICD-10-CM | POA: Diagnosis not present

## 2021-06-03 DIAGNOSIS — Z0000 Encounter for general adult medical examination without abnormal findings: Secondary | ICD-10-CM | POA: Diagnosis not present

## 2021-06-03 DIAGNOSIS — M150 Primary generalized (osteo)arthritis: Secondary | ICD-10-CM | POA: Diagnosis not present

## 2021-06-03 DIAGNOSIS — Z23 Encounter for immunization: Secondary | ICD-10-CM | POA: Diagnosis not present

## 2021-06-03 DIAGNOSIS — E119 Type 2 diabetes mellitus without complications: Secondary | ICD-10-CM | POA: Diagnosis not present

## 2021-06-03 DIAGNOSIS — E785 Hyperlipidemia, unspecified: Secondary | ICD-10-CM | POA: Diagnosis not present

## 2021-06-03 DIAGNOSIS — Z125 Encounter for screening for malignant neoplasm of prostate: Secondary | ICD-10-CM | POA: Diagnosis not present

## 2021-06-03 DIAGNOSIS — I2510 Atherosclerotic heart disease of native coronary artery without angina pectoris: Secondary | ICD-10-CM | POA: Diagnosis not present

## 2021-06-03 DIAGNOSIS — M1990 Unspecified osteoarthritis, unspecified site: Secondary | ICD-10-CM | POA: Diagnosis not present

## 2021-06-15 DIAGNOSIS — E119 Type 2 diabetes mellitus without complications: Secondary | ICD-10-CM | POA: Diagnosis not present

## 2021-06-15 DIAGNOSIS — N400 Enlarged prostate without lower urinary tract symptoms: Secondary | ICD-10-CM | POA: Diagnosis not present

## 2021-06-15 DIAGNOSIS — Z8679 Personal history of other diseases of the circulatory system: Secondary | ICD-10-CM | POA: Diagnosis not present

## 2021-06-15 DIAGNOSIS — T466X5A Adverse effect of antihyperlipidemic and antiarteriosclerotic drugs, initial encounter: Secondary | ICD-10-CM | POA: Diagnosis not present

## 2021-06-15 DIAGNOSIS — I2510 Atherosclerotic heart disease of native coronary artery without angina pectoris: Secondary | ICD-10-CM | POA: Diagnosis not present

## 2021-06-15 DIAGNOSIS — I10 Essential (primary) hypertension: Secondary | ICD-10-CM | POA: Diagnosis not present

## 2021-06-15 DIAGNOSIS — Z8673 Personal history of transient ischemic attack (TIA), and cerebral infarction without residual deficits: Secondary | ICD-10-CM | POA: Diagnosis not present

## 2021-06-15 DIAGNOSIS — M791 Myalgia, unspecified site: Secondary | ICD-10-CM | POA: Diagnosis not present

## 2021-06-15 DIAGNOSIS — E785 Hyperlipidemia, unspecified: Secondary | ICD-10-CM | POA: Diagnosis not present

## 2021-06-15 DIAGNOSIS — M150 Primary generalized (osteo)arthritis: Secondary | ICD-10-CM | POA: Diagnosis not present

## 2021-06-15 DIAGNOSIS — E7800 Pure hypercholesterolemia, unspecified: Secondary | ICD-10-CM | POA: Diagnosis not present

## 2021-06-16 DIAGNOSIS — M19011 Primary osteoarthritis, right shoulder: Secondary | ICD-10-CM | POA: Diagnosis not present

## 2021-06-16 DIAGNOSIS — M25511 Pain in right shoulder: Secondary | ICD-10-CM | POA: Diagnosis not present

## 2021-06-23 ENCOUNTER — Other Ambulatory Visit: Payer: Self-pay | Admitting: Physician Assistant

## 2021-06-24 ENCOUNTER — Other Ambulatory Visit: Payer: Self-pay | Admitting: Physician Assistant

## 2021-07-31 ENCOUNTER — Other Ambulatory Visit: Payer: Self-pay | Admitting: Student

## 2021-08-07 DIAGNOSIS — E119 Type 2 diabetes mellitus without complications: Secondary | ICD-10-CM | POA: Diagnosis not present

## 2021-08-07 DIAGNOSIS — I2510 Atherosclerotic heart disease of native coronary artery without angina pectoris: Secondary | ICD-10-CM | POA: Diagnosis not present

## 2021-08-07 DIAGNOSIS — E785 Hyperlipidemia, unspecified: Secondary | ICD-10-CM | POA: Diagnosis not present

## 2021-08-07 DIAGNOSIS — M150 Primary generalized (osteo)arthritis: Secondary | ICD-10-CM | POA: Diagnosis not present

## 2021-08-07 DIAGNOSIS — I10 Essential (primary) hypertension: Secondary | ICD-10-CM | POA: Diagnosis not present

## 2021-08-07 DIAGNOSIS — N401 Enlarged prostate with lower urinary tract symptoms: Secondary | ICD-10-CM | POA: Diagnosis not present

## 2021-09-25 DIAGNOSIS — M19011 Primary osteoarthritis, right shoulder: Secondary | ICD-10-CM | POA: Diagnosis not present

## 2021-10-06 DIAGNOSIS — E785 Hyperlipidemia, unspecified: Secondary | ICD-10-CM | POA: Diagnosis not present

## 2021-10-06 DIAGNOSIS — M1990 Unspecified osteoarthritis, unspecified site: Secondary | ICD-10-CM | POA: Diagnosis not present

## 2021-10-06 DIAGNOSIS — E669 Obesity, unspecified: Secondary | ICD-10-CM | POA: Diagnosis not present

## 2021-10-06 DIAGNOSIS — E119 Type 2 diabetes mellitus without complications: Secondary | ICD-10-CM | POA: Diagnosis not present

## 2021-10-06 DIAGNOSIS — M069 Rheumatoid arthritis, unspecified: Secondary | ICD-10-CM | POA: Diagnosis not present

## 2021-10-06 DIAGNOSIS — I10 Essential (primary) hypertension: Secondary | ICD-10-CM | POA: Diagnosis not present

## 2021-10-06 DIAGNOSIS — Z794 Long term (current) use of insulin: Secondary | ICD-10-CM | POA: Diagnosis not present

## 2021-10-06 DIAGNOSIS — Z6830 Body mass index (BMI) 30.0-30.9, adult: Secondary | ICD-10-CM | POA: Diagnosis not present

## 2021-10-06 DIAGNOSIS — R69 Illness, unspecified: Secondary | ICD-10-CM | POA: Diagnosis not present

## 2021-10-06 DIAGNOSIS — I25119 Atherosclerotic heart disease of native coronary artery with unspecified angina pectoris: Secondary | ICD-10-CM | POA: Diagnosis not present

## 2021-10-06 DIAGNOSIS — I252 Old myocardial infarction: Secondary | ICD-10-CM | POA: Diagnosis not present

## 2021-10-27 DIAGNOSIS — K648 Other hemorrhoids: Secondary | ICD-10-CM | POA: Diagnosis not present

## 2021-10-27 DIAGNOSIS — Z8371 Family history of colonic polyps: Secondary | ICD-10-CM | POA: Diagnosis not present

## 2021-10-27 DIAGNOSIS — D122 Benign neoplasm of ascending colon: Secondary | ICD-10-CM | POA: Diagnosis not present

## 2021-10-27 DIAGNOSIS — D124 Benign neoplasm of descending colon: Secondary | ICD-10-CM | POA: Diagnosis not present

## 2021-10-27 DIAGNOSIS — Z1211 Encounter for screening for malignant neoplasm of colon: Secondary | ICD-10-CM | POA: Diagnosis not present

## 2021-10-29 DIAGNOSIS — D122 Benign neoplasm of ascending colon: Secondary | ICD-10-CM | POA: Diagnosis not present

## 2021-10-29 DIAGNOSIS — D124 Benign neoplasm of descending colon: Secondary | ICD-10-CM | POA: Diagnosis not present

## 2021-11-04 DIAGNOSIS — M179 Osteoarthritis of knee, unspecified: Secondary | ICD-10-CM | POA: Diagnosis not present

## 2021-11-04 DIAGNOSIS — I2510 Atherosclerotic heart disease of native coronary artery without angina pectoris: Secondary | ICD-10-CM | POA: Diagnosis not present

## 2021-11-04 DIAGNOSIS — E785 Hyperlipidemia, unspecified: Secondary | ICD-10-CM | POA: Diagnosis not present

## 2021-11-04 DIAGNOSIS — M19049 Primary osteoarthritis, unspecified hand: Secondary | ICD-10-CM | POA: Diagnosis not present

## 2021-11-04 DIAGNOSIS — N400 Enlarged prostate without lower urinary tract symptoms: Secondary | ICD-10-CM | POA: Diagnosis not present

## 2021-11-04 DIAGNOSIS — E119 Type 2 diabetes mellitus without complications: Secondary | ICD-10-CM | POA: Diagnosis not present

## 2021-11-04 DIAGNOSIS — I10 Essential (primary) hypertension: Secondary | ICD-10-CM | POA: Diagnosis not present

## 2021-11-04 DIAGNOSIS — M150 Primary generalized (osteo)arthritis: Secondary | ICD-10-CM | POA: Diagnosis not present

## 2021-11-06 DIAGNOSIS — M19011 Primary osteoarthritis, right shoulder: Secondary | ICD-10-CM | POA: Diagnosis not present

## 2021-11-13 DIAGNOSIS — M150 Primary generalized (osteo)arthritis: Secondary | ICD-10-CM | POA: Diagnosis not present

## 2021-11-13 DIAGNOSIS — E785 Hyperlipidemia, unspecified: Secondary | ICD-10-CM | POA: Diagnosis not present

## 2021-11-13 DIAGNOSIS — E119 Type 2 diabetes mellitus without complications: Secondary | ICD-10-CM | POA: Diagnosis not present

## 2021-11-13 DIAGNOSIS — I10 Essential (primary) hypertension: Secondary | ICD-10-CM | POA: Diagnosis not present

## 2021-11-13 DIAGNOSIS — Z23 Encounter for immunization: Secondary | ICD-10-CM | POA: Diagnosis not present

## 2021-11-13 DIAGNOSIS — I2510 Atherosclerotic heart disease of native coronary artery without angina pectoris: Secondary | ICD-10-CM | POA: Diagnosis not present

## 2021-11-19 DIAGNOSIS — E1165 Type 2 diabetes mellitus with hyperglycemia: Secondary | ICD-10-CM | POA: Diagnosis not present

## 2021-11-19 DIAGNOSIS — I10 Essential (primary) hypertension: Secondary | ICD-10-CM | POA: Diagnosis not present

## 2021-11-19 DIAGNOSIS — E7800 Pure hypercholesterolemia, unspecified: Secondary | ICD-10-CM | POA: Diagnosis not present

## 2021-11-26 DIAGNOSIS — Z8679 Personal history of other diseases of the circulatory system: Secondary | ICD-10-CM | POA: Diagnosis not present

## 2021-11-26 DIAGNOSIS — I10 Essential (primary) hypertension: Secondary | ICD-10-CM | POA: Diagnosis not present

## 2021-11-26 DIAGNOSIS — E7800 Pure hypercholesterolemia, unspecified: Secondary | ICD-10-CM | POA: Diagnosis not present

## 2021-11-26 DIAGNOSIS — E1165 Type 2 diabetes mellitus with hyperglycemia: Secondary | ICD-10-CM | POA: Diagnosis not present

## 2021-12-29 ENCOUNTER — Encounter (HOSPITAL_COMMUNITY)
Admission: RE | Admit: 2021-12-29 | Discharge: 2021-12-29 | Disposition: A | Discharge status: Home / Self Care | Payer: Medicare HMO | Source: Ambulatory Visit | Attending: Orthopedic Surgery | Admitting: Orthopedic Surgery

## 2021-12-29 ENCOUNTER — Encounter (HOSPITAL_COMMUNITY): Payer: Self-pay

## 2021-12-29 VITALS — BP 115/75 | HR 71 | Temp 98.3°F | Resp 14 | Ht 70.0 in | Wt 215.0 lb

## 2021-12-29 DIAGNOSIS — I152 Hypertension secondary to endocrine disorders: Secondary | ICD-10-CM

## 2021-12-29 DIAGNOSIS — Z01812 Encounter for preprocedural laboratory examination: Secondary | ICD-10-CM | POA: Diagnosis present

## 2021-12-29 DIAGNOSIS — E119 Type 2 diabetes mellitus without complications: Secondary | ICD-10-CM

## 2021-12-29 DIAGNOSIS — Z01818 Encounter for other preprocedural examination: Secondary | ICD-10-CM

## 2021-12-29 DIAGNOSIS — E1159 Type 2 diabetes mellitus with other circulatory complications: Secondary | ICD-10-CM | POA: Diagnosis not present

## 2021-12-29 HISTORY — DX: Malignant (primary) neoplasm, unspecified: C80.1

## 2021-12-29 LAB — SURGICAL PCR SCREEN
MRSA, PCR: NEGATIVE
Staphylococcus aureus: NEGATIVE

## 2021-12-29 LAB — HEMOGLOBIN A1C
Hgb A1c MFr Bld: 6.8 % — ABNORMAL HIGH (ref 4.8–5.6)
Mean Plasma Glucose: 148.46 mg/dL

## 2021-12-29 LAB — CBC
HCT: 53.6 % — ABNORMAL HIGH (ref 39.0–52.0)
Hemoglobin: 16.9 g/dL (ref 13.0–17.0)
MCH: 29.8 pg (ref 26.0–34.0)
MCHC: 31.5 g/dL (ref 30.0–36.0)
MCV: 94.4 fL (ref 80.0–100.0)
Platelets: 112 10*3/uL — ABNORMAL LOW (ref 150–400)
RBC: 5.68 MIL/uL (ref 4.22–5.81)
RDW: 14.6 % (ref 11.5–15.5)
WBC: 8.4 10*3/uL (ref 4.0–10.5)
nRBC: 0 % (ref 0.0–0.2)

## 2021-12-29 LAB — BASIC METABOLIC PANEL
Anion gap: 11 (ref 5–15)
BUN: 18 mg/dL (ref 8–23)
CO2: 22 mmol/L (ref 22–32)
Calcium: 9.2 mg/dL (ref 8.9–10.3)
Chloride: 106 mmol/L (ref 98–111)
Creatinine, Ser: 0.95 mg/dL (ref 0.61–1.24)
GFR, Estimated: >60 mL/min (ref >60)
Glucose, Bld: 131 mg/dL — ABNORMAL HIGH (ref 70–99)
Potassium: 4.7 mmol/L (ref 3.5–5.1)
Sodium: 139 mmol/L (ref 135–145)

## 2021-12-29 LAB — GLUCOSE, CAPILLARY: Glucose-Capillary: 157 mg/dL — ABNORMAL HIGH (ref 70–99)

## 2021-12-29 NOTE — Progress Notes (Addendum)
COVID Vaccine Completed: yes  Date of COVID positive in last 90 days: no  PCP - Aura Dials, MD Cardiologist - Dr. Einar Gip  Chest x-ray - n/a EKG - 02/09/21 Epic Stress Test - 02/23/21 Epic ECHO - 03/02/21 Epic Cardiac Cath - 09/16/15 Epic Pacemaker/ICD device last checked:no Spinal Cord Stimulator: no  Bowel Prep - no  Sleep Study - n/a CPAP -   Fasting Blood Sugar - 120-160 Checks Blood Sugar 2 times a day  Blood Thinner Instructions: Aspirin Instructions: ASA 81, hold 5 days Last Dose:  Activity level: Can perform activities of daily living without stopping and without symptoms of chest pain or shortness of breath. Difficulty with stairs due to joints   Anesthesia review: CAD, HTN, stroke, TIA, DM2, 'bed sore' to buttock pt reports has thin skin and it will open up from time to time and close up currently not open  Patient denies shortness of breath, fever, cough and chest pain at PAT appointment  Patient verbalized understanding of instructions that were given to them at the PAT appointment. Patient was also instructed that they will need to review over the PAT instructions again at home before surgery.

## 2021-12-29 NOTE — Patient Instructions (Addendum)
SURGICAL WAITING ROOM VISITATION Patients having surgery or a procedure may have no more than 2 support people in the waiting area - these visitors may rotate.   Children under the age of 40 must have an adult with them who is not the patient. If the patient needs to stay at the hospital during part of their recovery, the visitor guidelines for inpatient rooms apply. Pre-op nurse will coordinate an appropriate time for 1 support person to accompany patient in pre-op.  This support person may not rotate.    Please refer to the [facility] website for the visitor guidelines for Inpatients (after your surgery is over and you are in a regular room).    Your procedure is scheduled on: 01/07/22   Report to [facility] Main Entrance    Report to admitting at 12:50 PM   Call this number if you have problems the morning of surgery [phone]   Do not eat food :After Midnight.   After Midnight you may have the following liquids until 12:20 PM DAY OF SURGERY  Water Non-Citrus Juices (without pulp, NO RED) Carbonated Beverages Black Coffee (NO MILK/CREAM OR CREAMERS, sugar ok)  Clear Tea (NO MILK/CREAM OR CREAMERS, sugar ok) regular and decaf                             Plain Jell-O (NO RED)                                           Fruit ices (not with fruit pulp, NO RED)                                     Popsicles (NO RED)                                                               Sports drinks like Gatorade (NO RED)                The day of surgery:  Drink ONE (1) Pre-Surgery G2 at 12:20 PM the morning of surgery. Drink in one sitting. Do not sip.  This drink was given to you during your hospital  pre-op appointment visit. Nothing else to drink after completing the  Pre-Surgery G2.          If you have questions, please contact your surgeon's office.   FOLLOW BOWEL PREP AND ANY ADDITIONAL PRE OP INSTRUCTIONS YOU RECEIVED FROM YOUR SURGEON'S OFFICE!!!     Oral Hygiene  is also important to reduce your risk of infection.                                    Remember - BRUSH YOUR TEETH THE MORNING OF SURGERY WITH YOUR REGULAR TOOTHPASTE   Take these medicines the morning of surgery with A SIP OF WATER: Tylenol, Carvedilol, Tramadol   DO NOT TAKE ANY ORAL DIABETIC MEDICATIONS DAY OF YOUR SURGERY  How to Manage Your Diabetes Before  and After Surgery  Why is it important to control my blood sugar before and after surgery? Improving blood sugar levels before and after surgery helps healing and can limit problems. A way of improving blood sugar control is eating a healthy diet by:  Eating less sugar and carbohydrates  Increasing activity/exercise  Talking with your doctor about reaching your blood sugar goals High blood sugars (greater than 180 mg/dL) can raise your risk of infections and slow your recovery, so you will need to focus on controlling your diabetes during the weeks before surgery. Make sure that the doctor who takes care of your diabetes knows about your planned surgery including the date and location.  How do I manage my blood sugar before surgery? Check your blood sugar at least 4 times a day, starting 2 days before surgery, to make sure that the level is not too high or low. Check your blood sugar the morning of your surgery when you wake up and every 2 hours until you get to the Short Stay unit. If your blood sugar is less than 70 mg/dL, you will need to treat for low blood sugar: Do not take insulin. Treat a low blood sugar (less than 70 mg/dL) with  cup of clear juice (cranberry or apple), 4 glucose tablets, OR glucose gel. Recheck blood sugar in 15 minutes after treatment (to make sure it is greater than 70 mg/dL). If your blood sugar is not greater than 70 mg/dL on recheck, call [phone] for further instructions. Report your blood sugar to the short stay nurse when you get to Short Stay.  If you are admitted to the hospital after  surgery: Your blood sugar will be checked by the staff and you will probably be given insulin after surgery (instead of oral diabetes medicines) to make sure you have good blood sugar levels. The goal for blood sugar control after surgery is 80-180 mg/dL.   WHAT DO I DO ABOUT MY DIABETES MEDICATION?  Do not take oral diabetes medicines (pills) the morning of surgery.  THE DAY BEFORE SURGERY,  Take morning dose of NPH insulin as prescribed, take 50% of evening dose. Take Novolin insulin as prescribed. Take Metformin as prescribed     THE MORNING OF SURGERY, take 50% of NPH insulin. Do not take Novolin unless blood sugar is greater than 220, then take 50% of dose. Do not take Metformin.   DO NOT TAKE THE FOLLOWING 7 DAYS PRIOR TO SURGERY: Ozempic, Wegovy, Rybelsus (Semaglutide), Byetta (exenatide), Bydureon (exenatide ER), Victoza, Saxenda (liraglutide), or Trulicity (dulaglutide) Mounjaro (Tirzepatide) Adlyxin (Lixisenatide), Polyethylene Glycol Loxenatide.  If your CBG is greater than 220 mg/dL, you may take  of your sliding scale  (correction) dose of insulin.  Reviewed and Endorsed by [facility] Patient Education Committee, August 2015                               You may not have any metal on your body including jewelry, and body piercing             Do not wear lotions, powders, cologne, or deodorant              Men may shave face and neck.   Do not bring valuables to the hospital. [place].  DO NOT BRING YOUR HOME MEDICATIONS TO  THE HOSPITAL. PHARMACY WILL DISPENSE MEDICATIONS LISTED ON YOUR MEDICATION LIST TO YOU DURING YOUR ADMISSION [place]!    Patients discharged on the day of surgery will not be allowed to drive home.  Someone NEEDS to stay with you for the first 24 hours after anesthesia.   Special Instructions: Bring a copy of your healthcare power of attorney and living will documents the day of surgery if  you haven't scanned them before.              Please read over the following fact sheets you were given: IF [place] [id number] Schneiders   If you received a COVID test during your pre-op visit  it is requested that you wear a mask when out in public, stay away from anyone that may not be feeling well and notify your surgeon if you develop symptoms. If you test positive for Covid or have been in contact with anyone that has tested positive in the last 10 days please notify you surgeon.     [place] - Preparing for Surgery Before surgery, you can play an important role.  Because skin is not sterile, your skin needs to be as free of germs as possible.  You can reduce the number of germs on your skin by washing with CHG (chlorahexidine gluconate) soap before surgery.  CHG is an antiseptic cleaner which kills germs and bonds with the skin to continue killing germs even after washing. Please DO NOT use if you have an allergy to CHG or antibacterial soaps.  If your skin becomes reddened/irritated stop using the CHG and inform your nurse when you arrive at Short Stay. Do not shave (including legs and underarms) for at least 48 hours prior to the first CHG shower.  You may shave your face/neck.  Please follow these instructions carefully:  1.  Shower with CHG Soap the night before surgery and the  morning of surgery.  2.  If you choose to wash your hair, wash your hair first as usual with your normal  shampoo.  3.  After you shampoo, rinse your hair and body thoroughly to remove the shampoo.                             4.  Use CHG as you would any other liquid soap.  You can apply chg directly to the skin and wash.  Gently with a scrungie or clean washcloth.  5.  Apply the CHG Soap to your body ONLY FROM THE NECK DOWN.   Do   not use on face/ open                           Wound or open sores. Avoid contact with eyes, ears mouth and   genitals  (private parts).                       Wash face,  Genitals (private parts) with your normal soap.             6.  Wash thoroughly, paying special attention to the area where your    surgery  will be performed.  7.  Thoroughly rinse your body with warm water from the neck down.  8.  DO NOT shower/wash with your normal soap after using and rinsing off the CHG Soap.  9.  Pat yourself dry with a clean towel.            10.  Wear clean pajamas.            11.  Place clean sheets on your bed the night of your first shower and do not  sleep with pets. Day of Surgery : Do not apply any lotions/deodorants the morning of surgery.  Please wear clean clothes to the hospital/surgery center.  FAILURE TO FOLLOW THESE INSTRUCTIONS MAY RESULT IN THE CANCELLATION OF YOUR SURGERY  PATIENT SIGNATURE_________________________________  NURSE SIGNATURE__________________________________  ________________________________________________________________________   Adam Phenix  An incentive spirometer is a tool that can help keep your lungs clear and active. This tool measures how well you are filling your lungs with each breath. Taking long deep breaths may help reverse or decrease the chance of developing breathing (pulmonary) problems (especially infection) following: A long period of time when you are unable to move or be active. BEFORE THE PROCEDURE  If the spirometer includes an indicator to show your best effort, your nurse or respiratory therapist will set it to a desired goal. If possible, sit up straight or lean slightly forward. Try not to slouch. Hold the incentive spirometer in an upright position. INSTRUCTIONS FOR USE  Sit on the edge of your bed if possible, or sit up as far as you can in bed or on a chair. Hold the incentive spirometer in an upright position. Breathe out normally. Place the mouthpiece in your mouth and seal your lips tightly around it. Breathe in slowly and  as deeply as possible, raising the piston or the ball toward the top of the column. Hold your breath for 3-5 seconds or for as long as possible. Allow the piston or ball to fall to the bottom of the column. Remove the mouthpiece from your mouth and breathe out normally. Rest for a few seconds and repeat Steps 1 through 7 at least 10 times every 1-2 hours when you are awake. Take your time and take a few normal breaths between deep breaths. The spirometer may include an indicator to show your best effort. Use the indicator as a goal to work toward during each repetition. After each set of 10 deep breaths, practice coughing to be sure your lungs are clear. If you have an incision (the cut made at the time of surgery), support your incision when coughing by placing a pillow or rolled up towels firmly against it. Once you are able to get out of bed, walk around indoors and cough well. You may stop using the incentive spirometer when instructed by your caregiver.  RISKS AND COMPLICATIONS Take your time so you do not get dizzy or light-headed. If you are in pain, you may need to take or ask for pain medication before doing incentive spirometry. It is harder to take a deep breath if you are having pain. AFTER USE Rest and breathe slowly and easily. It can be helpful to keep track of a log of your progress. Your caregiver can provide you with a simple table to help with this. If you are using the spirometer at home, follow these instructions: [place] IF:  You are having difficultly using the spirometer. You have trouble using the spirometer as often as instructed. Your pain medication is not giving enough relief while using the spirometer. You develop fever of 100.5 F (38.1 C) or higher. SEEK IMMEDIATE MEDICAL CARE IF:  You cough up bloody sputum that had  not been present before. You develop fever of 102 F (38.9 C) or greater. You develop worsening pain at or near the incision site. MAKE  SURE YOU:  Understand these instructions. Will watch your condition. Will get help right away if you are not doing well or get worse. Document Released: 07/05/2006 Document Revised: 05/17/2011 Document Reviewed: 09/05/2006 ExitCare Patient Information 2014 [place].   ________________________________________________________________________  [facility]- Preparing for Total Shoulder Arthroplasty    Before surgery, you can play an important role. Because skin is not sterile, your skin needs to be as free of germs as possible. You can reduce the number of germs on your skin by using the following products. Benzoyl Peroxide Gel Reduces the number of germs present on the skin Applied twice a day to shoulder area starting two days before surgery    ==================================================================  Please follow these instructions carefully:  BENZOYL PEROXIDE 5% GEL  Please do not use if you have an allergy to benzoyl peroxide.   If your skin becomes reddened/irritated stop using the benzoyl peroxide.  Starting two days before surgery, apply as follows: Apply benzoyl peroxide in the morning and at night. Apply after taking a shower. If you are not taking a shower clean entire shoulder front, back, and side along with the armpit with a clean wet washcloth.  Place a quarter-sized dollop on your shoulder and rub in thoroughly, making sure to cover the front, back, and side of your shoulder, along with the armpit.   2 days before ____ AM   ____ PM              1 day before ____ AM   ____ PM                         Do this twice a day for two days.  (Last application is the night before surgery, AFTER using the CHG soap as described below).  Do NOT apply benzoyl peroxide gel on the day of surgery.

## 2021-12-30 NOTE — Progress Notes (Signed)
Anesthesia Chart Review   Case: [id number] Date/Time: 01/07/22 1505   Procedure: TOTAL SHOULDER ARTHROPLASTY (Right: Shoulder) - 15mn   Anesthesia type: General   Pre-op diagnosis: Right shoulder osteoarthritis   Location: [facility] ROOM 06 / [facility]   Surgeons: SJustice Britain MD       DISCUSSION:71 y.o. never smoker with h/o HTN, DM II, CAD, CVA, right shoulder OA scheduled for above procedure 01/07/2022 with Dr. KJustice Britain   Pt last seen by cardiology 02/09/2021. Per OV note,pt stable with 1 year follow up recommended.   Anticipate pt can proceed with planned procedure barring acute status change.   VS: BP 115/75   Pulse 71   Temp 36.8 C (Oral)   Resp 14   Ht '5\' 10"'$  (1.778 m)   Wt 97.5 kg   SpO2 97%   BMI 30.85 kg/m   PROVIDERS: TAura Dials MD is PCP   Cardiologist - Dr. GEinar GipLABS: Labs reviewed: Acceptable for surgery. (all labs ordered are listed, but only abnormal results are displayed)  Labs Reviewed  HEMOGLOBIN A1C - Abnormal; Notable for the following components:      Result Value   Hgb A1c MFr Bld 6.8 (*)    All other components within normal limits  BASIC METABOLIC PANEL - Abnormal; Notable for the following components:   Glucose, Bld 131 (*)    All other components within normal limits  CBC - Abnormal; Notable for the following components:   HCT 53.6 (*)    Platelets 112 (*)    All other components within normal limits  GLUCOSE, CAPILLARY - Abnormal; Notable for the following components:   Glucose-Capillary 157 (*)    All other components within normal limits  SURGICAL PCR SCREEN     IMAGES:   EKG:   CV: Lexiscan (with Mod Bruce protocol) Nuclear stress test 02/23/2021: Nondiagnostic ECG stress. Normal LV size in both rest and stress images. Myocardial perfusion is normal with mild diaphragmatic attenuation. A small inferior wall scar cannot be completely excluded.  Moderate global hypokinesis of the left ventricle. Stress LV EF: 24%.  High  risk study due to low LVEF. Compared to the study done in 2017, mild lateral wall ischemia not present and LVEF was 37%. Findings may represent non ischemic cardiomyopathy.  Echocardiogram 02/23/2021:  Left ventricle cavity is normal in size. Mild concentric hypertrophy of  the left ventricle. Normal global wall motion. Normal LV systolic function  with visual EF 50-55%. Doppler evidence of grade I (impaired) diastolic  dysfunction, normal LAP.  No significant valvular abnormality.  IVC not seen.  Compared to previous study in 2017, LVEF is imorived from 45-50%.   Echo 06/23/2016 Study Conclusions   - Left ventricle: The cavity size was mildly dilated. Systolic    function was normal. The estimated ejection fraction was in the    range of 50% to 55%. Wall motion was normal; there were no    regional wall motion abnormalities. Features are consistent with    a pseudonormal left ventricular filling pattern, with concomitant    abnormal relaxation and increased filling pressure (grade 2    diastolic dysfunction). Doppler parameters are consistent with    high ventricular filling pressure.  - Aortic valve: Trileaflet; normal thickness, mildly calcified    leaflets.  - Left atrium: The atrium was mildly dilated.  - Pulmonary arteries: Systolic pressure could not be accurately    estimated.  Past Medical History:  Diagnosis Date   Arthritis  Asthma    Cancer ([place])    skin   Coronary artery disease    COVID    Diabetes mellitus    type 2, insulin dependent for 30+ years   History of kidney stones 11/2012   Hyperlipemia    Hypertension    IDDM (insulin dependent diabetes mellitus)    Shortness of breath    Stroke ([place])    hx TIA    Past Surgical History:  Procedure Laterality Date   ANGIOPLASTY  04/06/2011   x3    CARDIAC CATHETERIZATION     CARDIAC CATHETERIZATION N/A 09/16/2015   Procedure: Left Heart Cath and Coronary Angiography;  Surgeon: Adrian Prows, MD;  Location: [place] [facility];  Service: Cardiovascular;  Laterality: N/A;   cataracts     CHOLECYSTECTOMY     HERNIA REPAIR     x2   KNEE ARTHROSCOPY     LEFT HEART CATHETERIZATION WITH CORONARY ANGIOGRAM N/A 03/08/2011   Procedure: LEFT HEART CATHETERIZATION WITH CORONARY ANGIOGRAM;  Surgeon: Laverda Page, MD;  Location: [facility] C[facility] LAB;  Service: Cardiovascular;  Laterality: N/A;   PERCUTANEOUS CORONARY STENT INTERVENTION (PCI-S) N/A 04/06/2011   Procedure: PERCUTANEOUS CORONARY STENT INTERVENTION (PCI-S);  Surgeon: Laverda Page, MD;  Location: [facility] CATH LAB;  Service: Cardiovascular;  Laterality: N/A;    MEDICATIONS:  acetaminophen (TYLENOL) 650 MG CR tablet   Ascorbic Acid (VITAMIN C) 1000 MG tablet   aspirin EC 81 MG tablet   carvedilol (COREG) 12.5 MG tablet   chlorpheniramine (CHLOR-TRIMETON) 4 MG tablet   Cholecalciferol (VITAMIN D) 2000 units CAPS   CINNAMON PO   docusate sodium (COLACE) 100 MG capsule   DULoxetine (CYMBALTA) 30 MG capsule   FARXIGA 10 MG TABS tablet   insulin NPH (HUMULIN N,NOVOLIN N) 100 UNIT/ML injection   insulin regular (NOVOLIN R,HUMULIN R) 100 units/mL injection   MAGNESIUM PO   metFORMIN (GLUCOPHAGE-XR) 500 MG 24 hr tablet   nitroGLYCERIN (NITROSTAT) 0.4 MG SL tablet   olmesartan (BENICAR) 20 MG tablet   ONETOUCH VERIO test strip   PRALUENT 75 MG/ML SOAJ   traMADol (ULTRAM) 50 MG tablet   TURMERIC PO   No current facility-administered medications for this encounter.     Konrad Felix Ward, PA-C [facility] [phone]

## 2022-01-07 ENCOUNTER — Ambulatory Visit (HOSPITAL_COMMUNITY)
Admission: RE | Admit: 2022-01-07 | Discharge: 2022-01-07 | Disposition: A | Discharge status: Home / Self Care | Payer: Medicare HMO | Source: Ambulatory Visit | Attending: Orthopedic Surgery | Admitting: Orthopedic Surgery

## 2022-01-07 ENCOUNTER — Other Ambulatory Visit: Payer: Self-pay

## 2022-01-07 ENCOUNTER — Ambulatory Visit (HOSPITAL_COMMUNITY): Payer: Medicare HMO | Admitting: Physician Assistant

## 2022-01-07 ENCOUNTER — Ambulatory Visit (HOSPITAL_BASED_OUTPATIENT_CLINIC_OR_DEPARTMENT_OTHER): Payer: Medicare HMO | Admitting: Anesthesiology

## 2022-01-07 ENCOUNTER — Encounter (HOSPITAL_COMMUNITY): Payer: Self-pay | Admitting: Orthopedic Surgery

## 2022-01-07 ENCOUNTER — Encounter (HOSPITAL_COMMUNITY)
Admission: RE | Disposition: A | Discharge status: Home / Self Care | Payer: Self-pay | Source: Ambulatory Visit | Attending: Orthopedic Surgery

## 2022-01-07 DIAGNOSIS — E119 Type 2 diabetes mellitus without complications: Secondary | ICD-10-CM

## 2022-01-07 DIAGNOSIS — E1136 Type 2 diabetes mellitus with diabetic cataract: Secondary | ICD-10-CM | POA: Insufficient documentation

## 2022-01-07 DIAGNOSIS — Z79899 Other long term (current) drug therapy: Secondary | ICD-10-CM | POA: Insufficient documentation

## 2022-01-07 DIAGNOSIS — M19011 Primary osteoarthritis, right shoulder: Secondary | ICD-10-CM | POA: Insufficient documentation

## 2022-01-07 DIAGNOSIS — I10 Essential (primary) hypertension: Secondary | ICD-10-CM | POA: Insufficient documentation

## 2022-01-07 DIAGNOSIS — Z955 Presence of coronary angioplasty implant and graft: Secondary | ICD-10-CM | POA: Insufficient documentation

## 2022-01-07 DIAGNOSIS — Z794 Long term (current) use of insulin: Secondary | ICD-10-CM

## 2022-01-07 DIAGNOSIS — I2510 Atherosclerotic heart disease of native coronary artery without angina pectoris: Secondary | ICD-10-CM

## 2022-01-07 DIAGNOSIS — Z7984 Long term (current) use of oral hypoglycemic drugs: Secondary | ICD-10-CM | POA: Insufficient documentation

## 2022-01-07 DIAGNOSIS — G8918 Other acute postprocedural pain: Secondary | ICD-10-CM | POA: Diagnosis not present

## 2022-01-07 DIAGNOSIS — Z8673 Personal history of transient ischemic attack (TIA), and cerebral infarction without residual deficits: Secondary | ICD-10-CM | POA: Insufficient documentation

## 2022-01-07 HISTORY — PX: TOTAL SHOULDER ARTHROPLASTY: SHX126

## 2022-01-07 LAB — GLUCOSE, CAPILLARY
Glucose-Capillary: 192 mg/dL — ABNORMAL HIGH (ref 70–99)
Glucose-Capillary: 129 mg/dL — ABNORMAL HIGH (ref 70–99)

## 2022-01-07 SURGERY — ARTHROPLASTY, SHOULDER, TOTAL
Anesthesia: Regional | Site: Shoulder | Laterality: Right

## 2022-01-07 MED ORDER — ONDANSETRON HCL 4 MG/2ML IJ SOLN
INTRAMUSCULAR | Status: AC
  Filled 2022-01-07: qty 2

## 2022-01-07 MED ORDER — ROCURONIUM BROMIDE 10 MG/ML (PF) SYRINGE
PREFILLED_SYRINGE | INTRAVENOUS | Status: DC | PRN
  Administered 2022-01-07: 50 mg via INTRAVENOUS

## 2022-01-07 MED ORDER — SUGAMMADEX SODIUM 200 MG/2ML IV SOLN
INTRAVENOUS | Status: DC | PRN
  Administered 2022-01-07: 200 mg via INTRAVENOUS

## 2022-01-07 MED ORDER — 0.9 % SODIUM CHLORIDE (POUR BTL) OPTIME
TOPICAL | Status: DC | PRN
  Administered 2022-01-07: 1000 mL

## 2022-01-07 MED ORDER — LIDOCAINE 2% (20 MG/ML) 5 ML SYRINGE
INTRAMUSCULAR | Status: DC | PRN
  Administered 2022-01-07: 40 mg via INTRAVENOUS

## 2022-01-07 MED ORDER — ONDANSETRON HCL 4 MG/2ML IJ SOLN: 4.0000 mg | Freq: Once | INTRAMUSCULAR | Status: DC | PRN

## 2022-01-07 MED ORDER — TIZANIDINE HCL 4 MG PO TABS: 4.0000 mg | ORAL_TABLET | Freq: Three times a day (TID) | ORAL | 1 refills | Status: DC

## 2022-01-07 MED ORDER — DEXAMETHASONE SODIUM PHOSPHATE 10 MG/ML IJ SOLN
INTRAMUSCULAR | Status: AC
  Filled 2022-01-07: qty 1

## 2022-01-07 MED ORDER — PHENYLEPHRINE HCL (PRESSORS) 10 MG/ML IV SOLN
INTRAVENOUS | Status: AC
  Filled 2022-01-07: qty 1

## 2022-01-07 MED ORDER — LACTATED RINGERS IV BOLUS: 250.0000 mL | Freq: Once | INTRAVENOUS | Status: DC

## 2022-01-07 MED ORDER — AMISULPRIDE (ANTIEMETIC) 5 MG/2ML IV SOLN: 10.0000 mg | Freq: Once | INTRAVENOUS | Status: DC | PRN

## 2022-01-07 MED ORDER — PHENYLEPHRINE HCL-NACL 20-0.9 MG/250ML-% IV SOLN
INTRAVENOUS | Status: DC | PRN
  Administered 2022-01-07: 30 ug/min via INTRAVENOUS

## 2022-01-07 MED ORDER — DEXAMETHASONE SODIUM PHOSPHATE 10 MG/ML IJ SOLN
INTRAMUSCULAR | Status: DC | PRN
  Administered 2022-01-07: 10 mg via INTRAVENOUS

## 2022-01-07 MED ORDER — PROPOFOL 10 MG/ML IV BOLUS
INTRAVENOUS | Status: DC | PRN
  Administered 2022-01-07: 100 mg via INTRAVENOUS

## 2022-01-07 MED ORDER — BUPIVACAINE HCL (PF) 0.5 % IJ SOLN
INTRAMUSCULAR | Status: DC | PRN
  Administered 2022-01-07: 15 mL via PERINEURAL

## 2022-01-07 MED ORDER — TRANEXAMIC ACID 1000 MG/10ML IV SOLN: 1000.0000 mg | INTRAVENOUS | Status: DC

## 2022-01-07 MED ORDER — CHLORHEXIDINE GLUCONATE 0.12 % MT SOLN
15.0000 mL | Freq: Once | OROMUCOSAL | Status: AC
  Administered 2022-01-07: 15 mL via OROMUCOSAL

## 2022-01-07 MED ORDER — ONDANSETRON HCL 4 MG/2ML IJ SOLN
INTRAMUSCULAR | Status: DC | PRN
  Administered 2022-01-07: 4 mg via INTRAVENOUS

## 2022-01-07 MED ORDER — STERILE WATER FOR IRRIGATION IR SOLN
Status: DC | PRN
  Administered 2022-01-07: 2000 mL

## 2022-01-07 MED ORDER — BUPIVACAINE LIPOSOME 1.3 % IJ SUSP
INTRAMUSCULAR | Status: DC | PRN
  Administered 2022-01-07: 10 mL via PERINEURAL

## 2022-01-07 MED ORDER — VANCOMYCIN HCL 1000 MG IV SOLR
INTRAVENOUS | Status: AC
  Filled 2022-01-07: qty 20

## 2022-01-07 MED ORDER — FENTANYL CITRATE PF 50 MCG/ML IJ SOSY
50.0000 ug | PREFILLED_SYRINGE | INTRAMUSCULAR | Status: DC
  Administered 2022-01-07: 50 ug via INTRAVENOUS
  Filled 2022-01-07: qty 2

## 2022-01-07 MED ORDER — FENTANYL CITRATE PF 50 MCG/ML IJ SOSY: 25.0000 ug | PREFILLED_SYRINGE | INTRAMUSCULAR | Status: DC | PRN

## 2022-01-07 MED ORDER — ONDANSETRON HCL 4 MG PO TABS: 4.0000 mg | ORAL_TABLET | Freq: Three times a day (TID) | ORAL | 0 refills | Status: DC | PRN

## 2022-01-07 MED ORDER — VANCOMYCIN HCL 1000 MG IV SOLR
INTRAVENOUS | Status: DC | PRN
  Administered 2022-01-07: 1000 mg via TOPICAL

## 2022-01-07 MED ORDER — TRAMADOL HCL 50 MG PO TABS: 50.0000 mg | ORAL_TABLET | Freq: Four times a day (QID) | ORAL | 0 refills | Status: DC | PRN

## 2022-01-07 MED ORDER — ORAL CARE MOUTH RINSE: 15.0000 mL | Freq: Once | OROMUCOSAL | Status: AC

## 2022-01-07 MED ORDER — CEFAZOLIN SODIUM-DEXTROSE 2-4 GM/100ML-% IV SOLN
2.0000 g | INTRAVENOUS | Status: AC
  Administered 2022-01-07: 2 g via INTRAVENOUS
  Filled 2022-01-07: qty 100

## 2022-01-07 MED ORDER — OXYCODONE-ACETAMINOPHEN 5-325 MG PO TABS: 1.0000 | ORAL_TABLET | ORAL | 0 refills | Status: DC | PRN

## 2022-01-07 MED ORDER — MIDAZOLAM HCL 2 MG/2ML IJ SOLN
1.0000 mg | INTRAMUSCULAR | Status: DC
  Administered 2022-01-07: 1 mg via INTRAVENOUS
  Filled 2022-01-07: qty 2

## 2022-01-07 MED ORDER — PHENYLEPHRINE 80 MCG/ML (10ML) SYRINGE FOR IV PUSH (FOR BLOOD PRESSURE SUPPORT)
PREFILLED_SYRINGE | INTRAVENOUS | Status: DC | PRN
  Administered 2022-01-07 (×2): 160 ug via INTRAVENOUS
  Administered 2022-01-07: 80 ug via INTRAVENOUS
  Administered 2022-01-07: 160 ug via INTRAVENOUS

## 2022-01-07 MED ORDER — LACTATED RINGERS IV SOLN: INTRAVENOUS | Status: DC

## 2022-01-07 MED ORDER — ACETAMINOPHEN 500 MG PO TABS
1000.0000 mg | ORAL_TABLET | Freq: Once | ORAL | Status: AC
  Administered 2022-01-07: 1000 mg via ORAL
  Filled 2022-01-07: qty 2

## 2022-01-07 MED ORDER — LACTATED RINGERS IV BOLUS
500.0000 mL | Freq: Once | INTRAVENOUS | Status: AC
  Administered 2022-01-07: 500 mL via INTRAVENOUS

## 2022-01-07 MED ORDER — TRANEXAMIC ACID-NACL 1000-0.7 MG/100ML-% IV SOLN
1000.0000 mg | INTRAVENOUS | Status: AC
  Administered 2022-01-07: 1000 mg via INTRAVENOUS
  Filled 2022-01-07: qty 100

## 2022-01-07 SURGICAL SUPPLY — 69 items
ADH SKN CLS APL DERMABOND .7 (GAUZE/BANDAGES/DRESSINGS) ×1
AID PSTN UNV HD RSTRNT DISP (MISCELLANEOUS) ×1
BAG COUNTER SPONGE SURGICOUNT (BAG) IMPLANT
BAG SPEC THK2 15X12 ZIP CLS (MISCELLANEOUS) ×1
BAG SPNG CNTER NS LX DISP (BAG)
BAG ZIPLOCK 12X15 (MISCELLANEOUS) ×2 IMPLANT
BLADE SAW SGTL 83.5X18.5 (BLADE) ×2 IMPLANT
BODY TRUNION ECLIPSE 43 SL (Shoulder) IMPLANT
CEMENT BONE DEPUY (Cement) ×2 IMPLANT
COOLER ICEMAN CLASSIC (MISCELLANEOUS) ×2 IMPLANT
COVER BACK TABLE 60X90IN (DRAPES) ×2 IMPLANT
COVER SURGICAL LIGHT HANDLE (MISCELLANEOUS) ×2 IMPLANT
DERMABOND ADVANCED .7 DNX12 (GAUZE/BANDAGES/DRESSINGS) ×2 IMPLANT
DRAPE ORTHO SPLIT 77X108 STRL (DRAPES) ×2
DRAPE SHEET LG 3/4 BI-LAMINATE (DRAPES) ×2 IMPLANT
DRAPE SURG 17X11 SM STRL (DRAPES) ×2 IMPLANT
DRAPE SURG ORHT 6 SPLT 77X108 (DRAPES) ×4 IMPLANT
DRAPE TOP 10253 STERILE (DRAPES) ×2 IMPLANT
DRAPE U-SHAPE 47X51 STRL (DRAPES) ×2 IMPLANT
DRSG AQUACEL AG ADV 3.5X 6 (GAUZE/BANDAGES/DRESSINGS) IMPLANT
DRSG AQUACEL AG ADV 3.5X10 (GAUZE/BANDAGES/DRESSINGS) ×2 IMPLANT
DURAPREP 26ML APPLICATOR (WOUND CARE) ×2 IMPLANT
ELECT BLADE TIP CTD 4 INCH (ELECTRODE) ×2 IMPLANT
ELECT PENCIL ROCKER SW 15FT (MISCELLANEOUS) ×2 IMPLANT
ELECT REM PT RETURN 15FT ADLT (MISCELLANEOUS) ×2 IMPLANT
FACESHIELD WRAPAROUND (MASK) ×4 IMPLANT
FACESHIELD WRAPAROUND OR TEAM (MASK) ×8 IMPLANT
GLENOID WITH CLEAT MEDIUM (Shoulder) IMPLANT
GLOVE BIO SURGEON STRL SZ7.5 (GLOVE) ×2 IMPLANT
GLOVE BIO SURGEON STRL SZ8 (GLOVE) ×2 IMPLANT
GLOVE SS BIOGEL STRL SZ 7 (GLOVE) ×2 IMPLANT
GLOVE SS BIOGEL STRL SZ 7.5 (GLOVE) ×2 IMPLANT
GOWN STRL SURGICAL XL XLNG (GOWN DISPOSABLE) ×4 IMPLANT
HEAD HUM ECLIPSE 45/19 (Shoulder) IMPLANT
IMPL ECLIPSE SPEEDCAP (Shoulder) IMPLANT
IMPLANT ECLIPSE SPEEDCAP (Shoulder) ×1 IMPLANT
KIT BASIN OR (CUSTOM PROCEDURE TRAY) ×2 IMPLANT
KIT TURNOVER KIT A (KITS) IMPLANT
MANIFOLD NEPTUNE II (INSTRUMENTS) ×2 IMPLANT
NDL TAPERED W/ NITINOL LOOP (MISCELLANEOUS) IMPLANT
NEEDLE TAPERED W/ NITINOL LOOP (MISCELLANEOUS) ×1 IMPLANT
NS IRRIG 1000ML POUR BTL (IV SOLUTION) ×2 IMPLANT
PACK SHOULDER (CUSTOM PROCEDURE TRAY) ×2 IMPLANT
PAD COLD SHLDR WRAP-ON (PAD) ×2 IMPLANT
PIN NITINOL TARGETER 2.8 (PIN) IMPLANT
PIN SET MODULAR GLENOID SYSTEM (PIN) IMPLANT
PROTECTOR NERVE ULNAR (MISCELLANEOUS) ×2 IMPLANT
RESTRAINT HEAD UNIVERSAL NS (MISCELLANEOUS) ×2 IMPLANT
SCREW SPINAL 40 F/CAGE LRG (Screw) IMPLANT
SIZER ECLIPSE CAGE SCREW (ORTHOPEDIC DISPOSABLE SUPPLIES) IMPLANT
SLING ARM FOAM STRAP LRG (SOFTGOODS) IMPLANT
SLING ARM FOAM STRAP MED (SOFTGOODS) IMPLANT
SMARTMIX MINI TOWER (MISCELLANEOUS)
SPONGE T-LAP 4X18 ~~LOC~~+RFID (SPONGE) IMPLANT
SUCTION FRAZIER HANDLE 12FR (TUBING) ×1
SUCTION TUBE FRAZIER 12FR DISP (TUBING) ×2 IMPLANT
SUT FIBERWIRE #2 38 T-5 BLUE (SUTURE)
SUT MNCRL AB 3-0 PS2 18 (SUTURE) ×2 IMPLANT
SUT MON AB 2-0 CT1 36 (SUTURE) ×2 IMPLANT
SUT VIC AB 1 CT1 36 (SUTURE) ×2 IMPLANT
SUTURE FIBERWR #2 38 T-5 BLUE (SUTURE) IMPLANT
SUTURE TAPE 1.3 40 TPR END (SUTURE) IMPLANT
SUTURETAPE 1.3 40 TPR END (SUTURE)
TOWEL OR 17X26 10 PK STRL BLUE (TOWEL DISPOSABLE) ×2 IMPLANT
TOWEL OR NON WOVEN STRL DISP B (DISPOSABLE) ×2 IMPLANT
TOWER SMARTMIX MINI (MISCELLANEOUS) IMPLANT
TUBE SUCTION HIGH CAP CLEAR NV (SUCTIONS) ×2 IMPLANT
WATER STERILE IRR 1000ML POUR (IV SOLUTION) ×4 IMPLANT
YANKAUER SUCT BULB TIP 10FT TU (MISCELLANEOUS) ×2 IMPLANT

## 2022-01-07 NOTE — Anesthesia Procedure Notes (Signed)
Procedure Name: Intubation Date/Time: 01/07/2022 3:42 PM  Performed by: Myna Bright, CRNAPre-anesthesia Checklist: Patient identified, Emergency Drugs available, Suction available and Patient being monitored Patient Re-evaluated:Patient Re-evaluated prior to induction Oxygen Delivery Method: Circle system utilized Preoxygenation: Pre-oxygenation with 100% oxygen Induction Type: IV induction Ventilation: Mask ventilation without difficulty Laryngoscope Size: Mac and 4 Grade View: Grade II Tube type: Oral Tube size: 7.5 mm Number of attempts: 1 Airway Equipment and Method: Stylet Placement Confirmation: ETT inserted through vocal cords under direct vision, positive ETCO2 and breath sounds checked- equal and bilateral Secured at: 22 cm Tube secured with: Tape Dental Injury: Teeth and Oropharynx as per pre-operative assessment

## 2022-01-07 NOTE — Evaluation (Signed)
Occupational Therapy Evaluation Patient Details Name: Alan Wang MRN: [id number] DOB: 1950-03-11 Today's Date: 01/07/2022   History of Present Illness [name] is a 71 yr old male who is for planned R total shoulder arthroplasty on 01-07-2022, due to OA of the shoulder.   Clinical Impression   Patient and his spouse were seen by OT for pre-op session. Therapist provided education and instruction to patient and spouse with regards to [place], general shoulder precautions, compensatory strategies for ADLs, UE positioning, UE clothing recommendations, recommendations for bathing while maintaining shoulder precautions, use of ice for pain and edema management, and UE sling wear schedule. Patient and spouse verbalized understanding as needed.  Given the pt and his spouse were seen pre-op, he may benefit from additional OT services in the hospital setting, to ensure adequate carryover of the information provided, as well as to participate in a hands-on component to therapy. As such, OT will continue to follow him in the acute setting.   Patient to follow up with MD for further therapy needs, once discharged from the hospital.        Recommendations for follow up therapy are one component of a multi-disciplinary discharge planning process, led by the attending physician.  Recommendations may be updated based on patient status, additional functional criteria and insurance authorization.   Follow Up Recommendations  Follow physician's recommendations for discharge plan and follow up therapies    Assistance Recommended at Discharge Intermittent Supervision/Assistance  Patient can return home with the following Help with stairs or ramp for entrance;Assist for transportation;Assistance with cooking/housework;A little help with bathing/dressing/bathroom    Functional Status Assessment  Patient has had a recent decline in their functional status and demonstrates the ability to make significant  improvements in function in a reasonable and predictable amount of time.  Equipment Recommendations  None recommended by OT       Precautions / Restrictions Precautions Precautions: Shoulder Shoulder Interventions: Shoulder sling/immobilizer Precaution Booklet Issued: Yes (comment) Precaution Comments: May come out of sling if sitting in controlled environment. ie while watching tv, eating etc to give neck and skin break from sling. Please sleep in sling though until 4 weeks post op. Ok to use operative arm to assist in feeding, bathing, ADL's.       New PROM restrictions (8/18) for use in hygiene and ADL only   ER 20   ABD 45   FE 60. Pendulums are to be gentle and are the preferred exercise to be instructed for patients to perform at home.( Along with elbow wrist and hand exercise) Restrictions Weight Bearing Restrictions: Yes RUE Weight Bearing: Non weight bearing      Mobility  Transfers            General transfer comment: not assessed, as pt was seen for pre-op session          ADL either performed or assessed with clinical judgement      Vision Patient Visual Report: No change from baseline       Perception     Praxis      Pertinent Vitals/Pain Pain Assessment Pain Assessment: No/denies pain     Hand Dominance Right      Communication Communication Communication: No difficulties   Cognition Arousal/Alertness: Awake/alert Behavior During Therapy: WFL for tasks assessed/performed Overall Cognitive Status: Within Functional Limits for tasks assessed                   Shoulder Instructions  Reviewed with pt  and his spouse     Home Living Family/patient expects to be discharged to:: Private residence Living Arrangements: Spouse/significant other Available Help at Discharge: Family Type of Home: House Home Access: Stairs to enter Technical brewer of Steps: 3 Entrance Stairs-Rails: Left;Right Home Layout: Multi-level;Able to live on main  level with bedroom/bathroom     Bathroom Shower/Tub: Walk-in shower         Home Equipment: Rollator (4 wheels);Cane - single point          Prior Functioning/Environment Prior Level of Function : Independent/Modified Independent             Mobility Comments: Independent with ambulation ADLs Comments: He was independent with ADLs, though he had some difficulty with certain tasks given R shoulder pain.        OT Problem List: Decreased range of motion;Impaired UE functional use      OT Treatment/Interventions:   Self-care/ADL training;Therapeutic exercise;Energy conservation;DME and/or AE instruction;Balance training;Patient/family education;Therapeutic activities     OT Goals(Current goals can be found in the care plan section) Acute Rehab OT Goals OT Goal Formulation: With patient Time For Goal Achievement: 01/21/22 Potential to Achieve Goals: Good ADL Goals Pt Will Perform Upper Body Dressing: with supervision;sitting Pt Will Perform Lower Body Dressing: with supervision;sit to/from stand Pt Will Transfer to Toilet: with supervision;ambulating Pt/caregiver will Perform Home Exercise Program: With Supervision;Right Upper extremity  OT Frequency:  Min 2x//wk       AM-PAC OT "6 Clicks" Daily Activity     Outcome Measure Help from another person eating meals?: None Help from another person taking care of personal grooming?: None Help from another person toileting, which includes using toliet, bedpan, or urinal?: None Help from another person bathing (including washing, rinsing, drying)?: A Little Help from another person to put on and taking off regular upper body clothing?: A Little Help from another person to put on and taking off regular lower body clothing?: A Little 6 Click Score: 21   End of Session Nurse Communication:  (Pt was cleared for participation in therapy)  Activity Tolerance: Patient tolerated treatment well Patient left: in bed;with  family/visitor present  OT Visit Diagnosis: Muscle weakness (generalized) (M62.81)                Time: 1415-1430 OT Time Calculation (min): 15 min Charges:  OT General Charges $OT Visit: 1 Visit OT Evaluation $OT Eval Low Complexity: 1 Low    Zaley Talley L Taelor Waymire, OTR/L 01/07/2022, 3:55 PM

## 2022-01-07 NOTE — Transfer of Care (Signed)
Immediate Anesthesia Transfer of Care Note  Patient: Alan Wang  Procedure(s) Performed: TOTAL SHOULDER ARTHROPLASTY (Right: Shoulder)  Patient Location: PACU  Anesthesia Type:GA combined with regional for post-op pain  Level of Consciousness: awake, alert , oriented, and patient cooperative  Airway & Oxygen Therapy: Patient Spontanous Breathing and Patient connected to face mask oxygen  Post-op Assessment: Report given to RN and Post -op Vital signs reviewed and stable  Post vital signs: Reviewed and stable  Last Vitals:  Vitals Value Taken Time  BP 132/71 01/07/22 1730  Temp 36.9 C 01/07/22 1730  Pulse 72 01/07/22 1733  Resp 15 01/07/22 1733  SpO2 93 % 01/07/22 1733  Vitals shown include unvalidated device data.  Last Pain:  Vitals:   01/07/22 1338  PainSc: 0-No pain         Complications: No notable events documented.

## 2022-01-07 NOTE — Anesthesia Procedure Notes (Signed)
Anesthesia Regional Block: Interscalene brachial plexus block   Pre-Anesthetic Checklist: , timeout performed,  Correct Patient, Correct Site, Correct Laterality,  Correct Procedure, Correct Position, site marked,  Risks and benefits discussed,  Surgical consent,  Pre-op evaluation,  At surgeon's request and post-op pain management  Laterality: Right  Prep: chloraprep       Needles:  Injection technique: Single-shot  Needle Type: Echogenic Stimulator Needle     Needle Length: 9cm  Needle Gauge: 21     Additional Needles:   Procedures:,,,, ultrasound used (permanent image in chart),,    Narrative:  Start time: 01/07/2022 2:40 PM End time: 01/07/2022 2:50 PM Injection made incrementally with aspirations every 5 mL.  Performed by: Personally  Anesthesiologist: Murvin Natal, MD  Additional Notes: Functioning IV was confirmed and monitors were applied.  A timeout was performed. Sterile prep, hand hygiene and sterile gloves were used. A 68m 21ga Arrow echogenic stimulator needle was used. Negative aspiration and negative test dose prior to incremental administration of local anesthetic. The patient tolerated the procedure well.  Ultrasound guidance: relevent anatomy identified, needle position confirmed, local anesthetic spread visualized around nerve(s), vascular puncture avoided.  Image printed for medical record.

## 2022-01-07 NOTE — Op Note (Signed)
01/07/2022  5:09 PM  PATIENT:   Alan Wang  71 y.o. male  PRE-OPERATIVE DIAGNOSIS:  Right shoulder osteoarthritis  POST-OPERATIVE DIAGNOSIS: Same  PROCEDURE: Right shoulder anatomic arthroplasty utilizing a Arthrex size 43 trunnion with a large cage screw, 45 x 19 humeral head, medium glenoid  SURGEON:  Williamson Cavanah, Metta Clines M.D.  ASSISTANTS: Jenetta Loges, PA-C  Jenetta Loges, PA-C was utilized as an Environmental consultant throughout this case, essential for help with positioning the patient, positioning extremity, tissue manipulation, implantation of the prosthesis, suture management, wound closure, and intraoperative decision-making.  ANESTHESIA:   General endotracheal and interscalene block with Exparel  EBL: 150 cc  SPECIMEN: None  Drains: None   PATIENT DISPOSITION:  PACU - hemodynamically stable.    PLAN OF CARE: Discharge to home after PACU  Brief history:  Patient is a 71 year old male who has had many years of progressively increasing right shoulder pain related to severe osteoarthritis.  Due to his increasing functional rotations and failure to respond to prolonged attempts at conservative management, he is brought to the operating room this time for planned right shoulder anatomic arthroplasty.  Preoperatively, I counseled the patient regarding treatment options and risks versus benefits thereof.  Possible surgical complications were all reviewed including potential for bleeding, infection, neurovascular injury, persistent pain, loss of motion, anesthetic complication, failure of the implant, and possible need for additional surgery. They understand and accept and agrees with our planned procedure.  Procedure detail:  After undergoing routine preop evaluation patient received prophylactic antibiotics and interscalene block with Exparel was established in the holding area by the anesthesia department.  Patient was subsequently placed spine on the operating table and underwent the  smooth induction of a general endotracheal anesthesia.  Placed into the beachchair position and appropriately padded and protected.  The right shoulder girdle region was sterilely prepped and draped in standard fashion.  Timeout was called.  A deltopectoral approach to the right shoulder is made an approximately 10 cm incision.  Skin flaps were elevated dissection carried deeply and the deltopectoral interval was developed from proximal to distal with the vein taken laterally.  Adhesions were divided beneath the deltoid and the conjoined tendon was mobilized and retracted medially.  The long head biceps tendon appeared vestigial but we did tenodesed at the upper border the pectoralis major tendon and the proximal segment was unroofed and excised.  The rotator cuff was then split from the apex of the bicipital groove to the base of the coracoid and the subscapularis insertion was carefully identified and an oscillating saw was then used to perform a lesser tuberosity osteotomy removing a thin wafer of bone.  The subscap was intact mobilized and reflected medially.  Capsular attachments were then divided from the anterior and inferior margins of the humeral neck and the humeral head was then delivered through the wound.  An extra medullary guide was then used to outline the proposed humeral head resection which we performed with an oscillating saw at approximately 30 degrees retroversion.  Large marginal osteophytes were then removed with a rondure.  The metaphysis was sized at a 43 trunnion and preparation completed with the coring reamer and measured for a large cage screw.  A metal cap was then placed over the cut proximal humeral surface we then exposed the glenoid.  A circumferential labral resection was then performed.  There was some moderate glenoid retroversion and we corrected approximate 10 degrees reaming to a stable subchondral bony bed.  Preparation completed with the  central drill and then the superior  and inferior drill hole and slot respectively and broaching was completed and a trial showed good fit and fixation.  Cement was then mixed introduced into the superior and inferior peg and slot respectively and bone graft was impacted about the central peg of our glenoid the glenoid was then impacted achieving good fit and fixation.  We then returned our attention back to the metaphysis where the bone showed good quality for a stemless application.  The centering guide was then used to place the trunnion into position with a suture tape passed to the eyelet on the collar of the trunnion for later LTO repair.  The cage screw was then packed with bone and inserted achieving excellent purchase and fixation.  We then performed a series of trial reductions and ultimately felt that the 45 x 19 head gave is the best motion stability and soft tissue balance with approximately 50% posterior translation of the humeral head over the glenoid.  This point the trial was removed.  We placed 2 additional medial row suture anchors for the ultimate LTO repair.  The trunnion was cleaned and dried and the final head was then impacted into position.  Final reduction again showed excellent motion stability and soft tissue balance.  At this time we confirmed good mobility of the subscapularis.  A convergence stitch was then placed at the upper border the subscap into the anterior supraspinatus and we then passed our 3 medial row suture limbs up through the bone tendon junction of the LTO.  These were passed in an alternating fashion into 2 Lateral Row anchors placed into the bicipital groove achieving excellent purchase and solid fixation of the LTO.  This point the arm easily achieved 45 degrees of external rotation without excessive tension on the subscap repair.  We then used a series of figure-of-eight suture tape sutures to close the rotator interval.  Final irrigation was then completed.  Hemostasis was obtained.  Vancomycin powder  was then spread liberally throughout the deep soft tissue planes.  Deltopectoral interval reapproximated with a series of figure-of-eight number Vicryl sutures.  2-0 Monocryl used to close the subcu layer and intracuticular 3-0 Monocryl for the skin followed by Dermabond and Aquacel dressing.  The right arm was then placed into a sling.  The patient was awakened, extubated, and taken to the recovery room in stable condition.  Marin Shutter MD    Contact # [phone]

## 2022-01-07 NOTE — Discharge Instructions (Signed)

## 2022-01-07 NOTE — H&P (Signed)
Alan Wang    Chief Complaint: Right shoulder osteoarthritis HPI: The patient is a 71 y.o. male with chronic and progressively increasing right shoulder pain related to severe osteoarthritis.  Due to his increasing functional limitations and failure to respond to prolonged attempts at conservative management, he is brought to the operating room at this time for planned right shoulder anatomic arthroplasty.  Past Medical History:  Diagnosis Date   Arthritis    Asthma    Cancer ([place])    skin   Coronary artery disease    COVID    Diabetes mellitus    type 2, insulin dependent for 30+ years   History of kidney stones 11/2012   Hyperlipemia    Hypertension    IDDM (insulin dependent diabetes mellitus)    Shortness of breath    Stroke ([place])    hx TIA      Past Surgical History:  Procedure Laterality Date   ANGIOPLASTY  04/06/2011   x3    CARDIAC CATHETERIZATION     CARDIAC CATHETERIZATION N/A 09/16/2015   Procedure: Left Heart Cath and Coronary Angiography;  Surgeon: Alan Prows, MD;  Location: [facility];  Service: Cardiovascular;  Laterality: N/A;   cataracts     CHOLECYSTECTOMY     HERNIA REPAIR     x2   KNEE ARTHROSCOPY     LEFT HEART CATHETERIZATION WITH CORONARY ANGIOGRAM N/A 03/08/2011   Procedure: LEFT HEART CATHETERIZATION WITH CORONARY ANGIOGRAM;  Surgeon: Alan Page, MD;  Location: [facility] CATH LAB;  Service: Cardiovascular;  Laterality: N/A;   PERCUTANEOUS CORONARY STENT INTERVENTION (PCI-S) N/A 04/06/2011   Procedure: PERCUTANEOUS CORONARY STENT INTERVENTION (PCI-S);  Surgeon: Alan Page, MD;  Location: [facility] CATH LAB;  Service: Cardiovascular;  Laterality: N/A;    Family History  Problem Relation Age of Onset   Emphysema Mother    COPD Mother    Alzheimer's disease Father    Asthma Paternal Aunt    Heart attack Paternal Grandfather     Social History:  reports that he has never smoked. He has never used smokeless tobacco. He reports current  alcohol use. He reports that he does not use drugs.  BMI: Estimated body mass index is 30.84 kg/m as calculated from the following:   Height as of this encounter: '5\' 10"'$  (1.778 m).   Weight as of this encounter: 97.5 kg.  Lab Results  Component Value Date   ALBUMIN 4.2 10/26/2016   Diabetes:   Patient has a diagnosis of diabetes,  Lab Results  Component Value Date   HGBA1C 6.8 (H) 12/29/2021   Smoking Status:   reports that he has never smoked. He has never used smokeless tobacco.     Medications Prior to Admission  Medication Sig Dispense Refill   acetaminophen (TYLENOL) 650 MG CR tablet Take 1,300 mg by mouth 2 (two) times daily.     Ascorbic Acid (VITAMIN C) 1000 MG tablet Take 1,000 mg by mouth daily.     aspirin EC 81 MG tablet Take 81 mg by mouth at bedtime.     carvedilol (COREG) 12.5 MG tablet TAKE ONE TABLET BY MOUTH TWICE DAILY WITH A MEAL 180 tablet 1   chlorpheniramine (CHLOR-TRIMETON) 4 MG tablet Take 4 mg by mouth at bedtime.     Cholecalciferol (VITAMIN D) 2000 units CAPS Take 2,000 Units by mouth daily.     CINNAMON PO Take 2,000 mg by mouth 2 (two) times daily.     docusate sodium (COLACE)  100 MG capsule Take 100 mg by mouth daily.     DULoxetine (CYMBALTA) 30 MG capsule Take 30 mg by mouth at bedtime.     FARXIGA 10 MG TABS tablet Take 5 mg by mouth daily.     insulin NPH (HUMULIN N,NOVOLIN N) 100 UNIT/ML injection Inject 10-35 Units into the skin See admin instructions. 10 units in morning  35 units before dinner     insulin regular (NOVOLIN R,HUMULIN R) 100 units/mL injection Inject 20-25 Units into the skin 2 (two) times daily before a meal. 20 units in the AM ans 25 units at dinner     MAGNESIUM PO Take 500 mg by mouth daily.     metFORMIN (GLUCOPHAGE-XR) 500 MG 24 hr tablet Take 2 tablets (1,000 mg total) by mouth 2 (two) times daily.     olmesartan (BENICAR) 20 MG tablet Take 20 mg by mouth daily.     PRALUENT 75 MG/ML SOAJ Inject 75 mg into the skin  every 14 (fourteen) days.     traMADol (ULTRAM) 50 MG tablet Take 50 mg by mouth every 6 (six) hours as needed for moderate pain. Prescribed by PCP.     TURMERIC PO Take 1 capsule by mouth daily.     nitroGLYCERIN (NITROSTAT) 0.4 MG SL tablet Place 1 tablet (0.4 mg total) under the tongue every 5 (five) minutes as needed for up to 25 days for chest pain. 25 tablet 3   ONETOUCH VERIO test strip USE TO TEST BLOOD SUGAR 4 TIMES DAILY       Physical Exam: Right shoulder demonstrates painful and guarded motion as noted at recent office visits.  He demonstrates excellent strength to manual muscle testing.  He is neurovascular intact in the right upper extremity.  Plain radiographs of the right shoulder confirm severe osteoarthritis with complete obliteration of the joint space, subchondral sclerosis, and peripheral osteophyte formation.  Vitals  Pulse Rate:  [67-71] 71 (11/02 1440) Resp:  [15-17] 15 (11/02 1440) BP: (135-148)/(86-88) 135/88 (11/02 1440) SpO2:  [93 %-98 %] 93 % (11/02 1440) Weight:  [97.5 kg] 97.5 kg (11/02 1313)  Assessment/Plan  Impression: Right shoulder osteoarthritis  Plan of Action: Procedure(s): TOTAL SHOULDER ARTHROPLASTY  Alan Wang [name] 01/07/2022, 2:45 PM Contact # [phone]

## 2022-01-07 NOTE — Anesthesia Preprocedure Evaluation (Addendum)
Anesthesia Evaluation  Patient identified by MRN, date of birth, ID band Patient awake    Reviewed: Allergy & Precautions, NPO status , Patient's Chart, lab work & pertinent test results  Airway Mallampati: III  TM Distance: >3 FB Neck ROM: Full    Dental no notable dental hx.    Pulmonary asthma    Pulmonary exam normal        Cardiovascular hypertension, Pt. on home beta blockers and Pt. on medications + CAD and + Cardiac Stents  Normal cardiovascular exam     Neuro/Psych TIA negative psych ROS   GI/Hepatic negative GI ROS, Neg liver ROS,,,  Endo/Other  diabetes, Insulin Dependent, Oral Hypoglycemic Agents    Renal/GU negative Renal ROS     Musculoskeletal  (+) Arthritis ,    Abdominal   Peds  Hematology negative hematology ROS (+)   Anesthesia Other Findings Right shoulder osteoarthritis  Reproductive/Obstetrics                             Anesthesia Physical Anesthesia Plan  ASA: 3  Anesthesia Plan: General and Regional   Post-op Pain Management: Regional block*   Induction: Intravenous  PONV Risk Score and Plan: 2 and Ondansetron, Dexamethasone and Treatment may vary due to age or medical condition  Airway Management Planned: Oral ETT  Additional Equipment:   Intra-op Plan:   Post-operative Plan: Extubation in OR  Informed Consent: I have reviewed the patients History and Physical, chart, labs and discussed the procedure including the risks, benefits and alternatives for the proposed anesthesia with the patient or authorized representative who has indicated his/her understanding and acceptance.     Dental advisory given  Plan Discussed with: CRNA  Anesthesia Plan Comments:        Anesthesia Quick Evaluation

## 2022-01-07 NOTE — Anesthesia Postprocedure Evaluation (Signed)
Anesthesia Post Note  Patient: Alan Wang  Procedure(s) Performed: TOTAL SHOULDER ARTHROPLASTY (Right: Shoulder)     Patient location during evaluation: PACU Anesthesia Type: Regional and General Level of consciousness: awake Pain management: pain level controlled Vital Signs Assessment: post-procedure vital signs reviewed and stable Respiratory status: spontaneous breathing, nonlabored ventilation, respiratory function stable and patient connected to nasal cannula oxygen Cardiovascular status: blood pressure returned to baseline and stable Postop Assessment: no apparent nausea or vomiting Anesthetic complications: no   No notable events documented.  Last Vitals:  Vitals:   01/07/22 1830 01/07/22 1836  BP: (!) 143/74 134/64  Pulse: 73 68  Resp: 15   Temp: 37 C   SpO2: 93% 93%    Last Pain:  Vitals:   01/07/22 1836  PainSc: 2                  Tiona Ruane P Taraji Mungo

## 2022-01-08 ENCOUNTER — Encounter (HOSPITAL_COMMUNITY): Payer: Self-pay | Admitting: Orthopedic Surgery

## 2022-01-13 ENCOUNTER — Other Ambulatory Visit: Payer: Self-pay

## 2022-01-13 DIAGNOSIS — G459 Transient cerebral ischemic attack, unspecified: Secondary | ICD-10-CM

## 2022-01-13 MED ORDER — CARVEDILOL 12.5 MG PO TABS: 12.5000 mg | ORAL_TABLET | Freq: Two times a day (BID) | ORAL | 1 refills | Status: DC

## 2022-01-14 DIAGNOSIS — E785 Hyperlipidemia, unspecified: Secondary | ICD-10-CM | POA: Diagnosis not present

## 2022-01-14 DIAGNOSIS — I10 Essential (primary) hypertension: Secondary | ICD-10-CM | POA: Diagnosis not present

## 2022-01-14 DIAGNOSIS — M150 Primary generalized (osteo)arthritis: Secondary | ICD-10-CM | POA: Diagnosis not present

## 2022-01-14 DIAGNOSIS — E119 Type 2 diabetes mellitus without complications: Secondary | ICD-10-CM | POA: Diagnosis not present

## 2022-01-14 DIAGNOSIS — M179 Osteoarthritis of knee, unspecified: Secondary | ICD-10-CM | POA: Diagnosis not present

## 2022-01-14 DIAGNOSIS — N400 Enlarged prostate without lower urinary tract symptoms: Secondary | ICD-10-CM | POA: Diagnosis not present

## 2022-01-14 DIAGNOSIS — M19049 Primary osteoarthritis, unspecified hand: Secondary | ICD-10-CM | POA: Diagnosis not present

## 2022-01-25 DIAGNOSIS — Z471 Aftercare following joint replacement surgery: Secondary | ICD-10-CM | POA: Diagnosis not present

## 2022-01-25 DIAGNOSIS — Z96612 Presence of left artificial shoulder joint: Secondary | ICD-10-CM | POA: Diagnosis not present

## 2022-02-08 ENCOUNTER — Other Ambulatory Visit: Payer: Self-pay

## 2022-02-08 DIAGNOSIS — M25611 Stiffness of right shoulder, not elsewhere classified: Secondary | ICD-10-CM | POA: Diagnosis not present

## 2022-02-08 DIAGNOSIS — M25511 Pain in right shoulder: Secondary | ICD-10-CM | POA: Diagnosis not present

## 2022-02-08 DIAGNOSIS — G459 Transient cerebral ischemic attack, unspecified: Secondary | ICD-10-CM

## 2022-02-08 MED ORDER — CARVEDILOL 12.5 MG PO TABS: 12.5000 mg | ORAL_TABLET | Freq: Two times a day (BID) | ORAL | 1 refills | Status: DC

## 2022-02-09 ENCOUNTER — Other Ambulatory Visit: Payer: Self-pay

## 2022-02-09 MED ORDER — OLMESARTAN MEDOXOMIL 20 MG PO TABS: 20.0000 mg | ORAL_TABLET | Freq: Every day | ORAL | 1 refills | Status: DC

## 2022-02-10 ENCOUNTER — Ambulatory Visit: Payer: Medicare HMO | Admitting: Student

## 2022-02-10 ENCOUNTER — Ambulatory Visit: Payer: Self-pay | Admitting: Cardiology

## 2022-02-15 DIAGNOSIS — M25611 Stiffness of right shoulder, not elsewhere classified: Secondary | ICD-10-CM | POA: Diagnosis not present

## 2022-02-24 DIAGNOSIS — Z96611 Presence of right artificial shoulder joint: Secondary | ICD-10-CM | POA: Diagnosis not present

## 2022-02-24 DIAGNOSIS — Z471 Aftercare following joint replacement surgery: Secondary | ICD-10-CM | POA: Diagnosis not present

## 2022-03-02 DIAGNOSIS — M25511 Pain in right shoulder: Secondary | ICD-10-CM | POA: Diagnosis not present

## 2022-03-02 DIAGNOSIS — M25611 Stiffness of right shoulder, not elsewhere classified: Secondary | ICD-10-CM | POA: Diagnosis not present

## 2022-03-31 ENCOUNTER — Other Ambulatory Visit: Payer: Self-pay | Admitting: Cardiology

## 2022-04-26 DIAGNOSIS — Z96611 Presence of right artificial shoulder joint: Secondary | ICD-10-CM | POA: Diagnosis not present

## 2022-05-10 DIAGNOSIS — M25511 Pain in right shoulder: Secondary | ICD-10-CM | POA: Diagnosis not present

## 2022-05-10 DIAGNOSIS — M25611 Stiffness of right shoulder, not elsewhere classified: Secondary | ICD-10-CM | POA: Diagnosis not present

## 2022-05-12 DIAGNOSIS — M25511 Pain in right shoulder: Secondary | ICD-10-CM | POA: Diagnosis not present

## 2022-05-12 DIAGNOSIS — M25611 Stiffness of right shoulder, not elsewhere classified: Secondary | ICD-10-CM | POA: Diagnosis not present

## 2022-05-15 NOTE — Progress Notes (Signed)
No show

## 2022-05-19 DIAGNOSIS — M25611 Stiffness of right shoulder, not elsewhere classified: Secondary | ICD-10-CM | POA: Diagnosis not present

## 2022-05-19 DIAGNOSIS — M25511 Pain in right shoulder: Secondary | ICD-10-CM | POA: Diagnosis not present

## 2022-05-21 DIAGNOSIS — M25511 Pain in right shoulder: Secondary | ICD-10-CM | POA: Diagnosis not present

## 2022-05-21 DIAGNOSIS — M25611 Stiffness of right shoulder, not elsewhere classified: Secondary | ICD-10-CM | POA: Diagnosis not present

## 2022-05-24 DIAGNOSIS — M25511 Pain in right shoulder: Secondary | ICD-10-CM | POA: Diagnosis not present

## 2022-05-24 DIAGNOSIS — M25611 Stiffness of right shoulder, not elsewhere classified: Secondary | ICD-10-CM | POA: Diagnosis not present

## 2022-05-27 DIAGNOSIS — Z8673 Personal history of transient ischemic attack (TIA), and cerebral infarction without residual deficits: Secondary | ICD-10-CM | POA: Diagnosis not present

## 2022-05-27 DIAGNOSIS — E1165 Type 2 diabetes mellitus with hyperglycemia: Secondary | ICD-10-CM | POA: Diagnosis not present

## 2022-05-27 DIAGNOSIS — Z8679 Personal history of other diseases of the circulatory system: Secondary | ICD-10-CM | POA: Diagnosis not present

## 2022-05-27 DIAGNOSIS — E7800 Pure hypercholesterolemia, unspecified: Secondary | ICD-10-CM | POA: Diagnosis not present

## 2022-05-27 DIAGNOSIS — D721 Eosinophilia, unspecified: Secondary | ICD-10-CM | POA: Diagnosis not present

## 2022-05-27 DIAGNOSIS — R5383 Other fatigue: Secondary | ICD-10-CM | POA: Diagnosis not present

## 2022-05-27 DIAGNOSIS — Z789 Other specified health status: Secondary | ICD-10-CM | POA: Diagnosis not present

## 2022-05-27 DIAGNOSIS — R2689 Other abnormalities of gait and mobility: Secondary | ICD-10-CM | POA: Diagnosis not present

## 2022-05-27 DIAGNOSIS — E11319 Type 2 diabetes mellitus with unspecified diabetic retinopathy without macular edema: Secondary | ICD-10-CM | POA: Diagnosis not present

## 2022-05-27 DIAGNOSIS — I10 Essential (primary) hypertension: Secondary | ICD-10-CM | POA: Diagnosis not present

## 2022-05-27 DIAGNOSIS — G609 Hereditary and idiopathic neuropathy, unspecified: Secondary | ICD-10-CM | POA: Diagnosis not present

## 2022-05-28 DIAGNOSIS — E785 Hyperlipidemia, unspecified: Secondary | ICD-10-CM | POA: Diagnosis not present

## 2022-05-28 DIAGNOSIS — I2510 Atherosclerotic heart disease of native coronary artery without angina pectoris: Secondary | ICD-10-CM | POA: Diagnosis not present

## 2022-05-28 DIAGNOSIS — E119 Type 2 diabetes mellitus without complications: Secondary | ICD-10-CM | POA: Diagnosis not present

## 2022-05-28 DIAGNOSIS — G729 Myopathy, unspecified: Secondary | ICD-10-CM | POA: Diagnosis not present

## 2022-05-28 DIAGNOSIS — Z0000 Encounter for general adult medical examination without abnormal findings: Secondary | ICD-10-CM | POA: Diagnosis not present

## 2022-05-28 DIAGNOSIS — I10 Essential (primary) hypertension: Secondary | ICD-10-CM | POA: Diagnosis not present

## 2022-05-28 DIAGNOSIS — R296 Repeated falls: Secondary | ICD-10-CM | POA: Diagnosis not present

## 2022-05-28 DIAGNOSIS — M150 Primary generalized (osteo)arthritis: Secondary | ICD-10-CM | POA: Diagnosis not present

## 2022-05-28 DIAGNOSIS — N400 Enlarged prostate without lower urinary tract symptoms: Secondary | ICD-10-CM | POA: Diagnosis not present

## 2022-06-09 DIAGNOSIS — Z96611 Presence of right artificial shoulder joint: Secondary | ICD-10-CM | POA: Diagnosis not present

## 2022-08-03 ENCOUNTER — Ambulatory Visit: Payer: Medicare HMO | Admitting: Diagnostic Neuroimaging

## 2022-08-03 ENCOUNTER — Encounter: Payer: Self-pay | Admitting: Diagnostic Neuroimaging

## 2022-08-03 VITALS — BP 118/72 | HR 79 | Ht 69.0 in | Wt 210.0 lb

## 2022-08-03 DIAGNOSIS — R269 Unspecified abnormalities of gait and mobility: Secondary | ICD-10-CM | POA: Diagnosis not present

## 2022-08-03 NOTE — Progress Notes (Signed)
[facility]  PATIENT: Alan Wang DOB: 1950-11-11  REFERRING CLINICIAN: Dorisann Frames, MD  HISTORY FROM: patient and wife  REASON FOR VISIT: new consult   HISTORICAL  CHIEF COMPLAINT:  Chief Complaint  Patient presents with   New Patient (Initial Visit)    Rm 6, Linda wife  Pt is here for dizziness, balance/gait issues, has fallen 3 in the last 4 weeks.  pt's wife also mentioned short term memory.     HISTORY OF PRESENT ILLNESS:   UPDATE (08/03/22, VRP): Since last visit, more issues with balance and falls. DIABETES is better controlled. Not that active lately. Decreased stamina. Some short term memory issues. Maintaining ADLs.   PRIOR HPI (09/10/13): 72 year old right-handed male with intermittent dizziness. Has history of diabetes, TIA, cardiac stents.  For past 6 weeks basis of intermittent dizziness episodes consisting of losing balance, leaning to one side, staggering. Symptoms only occur when he moves from sitting to standing position or if he has been walking or exerting himself. Symptoms do not occur when he is sitting or laying down. No spinning sensation. No falls. No palpitations or sweating. She's had episodes 5-6 weeks. These occur 3-4 days out of the week and up to 2 times per day.  Contribute factors can include physical exertion, decreased fluid intake, GI illness. One episode that was more prolonged occur after he was mowing the lawn outside and began to feel very dizzy/lightheaded.  Patient has checked his blood sugar blood pressure with some of these episodes and has been unremarkable.   REVIEW OF SYSTEMS: Full 14 system review of systems performed and negative except: as per HPI.   ALLERGIES: Allergies  Allergen Reactions   Avandia [Rosiglitazone Maleate] Swelling    Lower extremities     HOME MEDICATIONS: Outpatient Medications Prior to Visit  Medication Sig Dispense Refill   acetaminophen (TYLENOL) 650 MG CR tablet Take 1,300  mg by mouth 2 (two) times daily.     Ascorbic Acid (VITAMIN C) 1000 MG tablet Take 1,000 mg by mouth daily.     aspirin EC 81 MG tablet Take 81 mg by mouth at bedtime.     carvedilol (COREG) 12.5 MG tablet Take 1 tablet (12.5 mg total) by mouth 2 (two) times daily with a meal. 180 tablet 1   chlorpheniramine (CHLOR-TRIMETON) 4 MG tablet Take 4 mg by mouth at bedtime.     Cholecalciferol (VITAMIN D) 2000 units CAPS Take 2,000 Units by mouth daily.     CINNAMON PO Take 2,000 mg by mouth 2 (two) times daily.     cyclobenzaprine (FLEXERIL) 10 MG tablet Take 10 mg by mouth at bedtime as needed.     docusate sodium (COLACE) 100 MG capsule Take 100 mg by mouth daily.     DULoxetine (CYMBALTA) 30 MG capsule Take 30 mg by mouth at bedtime.     FARXIGA 10 MG TABS tablet Take 5 mg by mouth daily.     insulin NPH (HUMULIN N,NOVOLIN N) 100 UNIT/ML injection Inject 10-35 Units into the skin See admin instructions. 10 units in morning  35 units before dinner     insulin regular (NOVOLIN R,HUMULIN R) 100 units/mL injection Inject 20-25 Units into the skin 2 (two) times daily before a meal. 20 units in the AM ans 25 units at dinner     MAGNESIUM PO Take 500 mg by mouth daily.     metFORMIN (GLUCOPHAGE-XR) 500 MG 24 hr tablet Take 2 tablets (1,000 mg total) by  mouth 2 (two) times daily.     olmesartan (BENICAR) 20 MG tablet TAKE ONE TABLET BY MOUTH DAILY 30 tablet 0   ondansetron (ZOFRAN) 4 MG tablet Take 1 tablet (4 mg total) by mouth every 8 (eight) hours as needed for nausea or vomiting. 10 tablet 0   ONETOUCH VERIO test strip USE TO TEST BLOOD SUGAR 4 TIMES DAILY     oxyCODONE-acetaminophen (PERCOCET) 5-325 MG tablet Take 1 tablet by mouth every 4 (four) hours as needed for severe pain (max 6 q). 12 tablet 0   PRALUENT 75 MG/ML SOAJ Inject 75 mg into the skin every 14 (fourteen) days.     REPATHA SURECLICK 140 MG/ML SOAJ Inject 140 mg as directed 2 (two) times a week.     tiZANidine (ZANAFLEX) 4 MG tablet  Take 1 tablet (4 mg total) by mouth 3 (three) times daily. 90 tablet 1   traMADol (ULTRAM) 50 MG tablet Take 50 mg by mouth every 6 (six) hours as needed for moderate pain. Prescribed by PCP.     traMADol (ULTRAM) 50 MG tablet Take 1 tablet (50 mg total) by mouth every 6 (six) hours as needed for moderate pain. 20 tablet 0   TURMERIC PO Take 1 capsule by mouth daily.     nitroGLYCERIN (NITROSTAT) 0.4 MG SL tablet Place 1 tablet (0.4 mg total) under the tongue every 5 (five) minutes as needed for up to 25 days for chest pain. 25 tablet 3   No facility-administered medications prior to visit.    PAST MEDICAL HISTORY: Past Medical History:  Diagnosis Date   Arthritis    Asthma    Cancer ([facility])    skin   Coronary artery disease    COVID    Diabetes mellitus    type 2, insulin dependent for 30+ years   History of kidney stones 11/2012   Hyperlipemia    Hypertension    IDDM (insulin dependent diabetes mellitus)    Shortness of breath    Stroke ([facility])    hx TIA    PAST SURGICAL HISTORY: Past Surgical History:  Procedure Laterality Date   ANGIOPLASTY  04/06/2011   x3    CARDIAC CATHETERIZATION     CARDIAC CATHETERIZATION N/A 09/16/2015   Procedure: Left Heart Cath and Coronary Angiography;  Surgeon: Yates Decamp, MD;  Location: [facility] INVASIVE CV LAB;  Service: Cardiovascular;  Laterality: N/A;   cataracts     CHOLECYSTECTOMY     HERNIA REPAIR     x2   KNEE ARTHROSCOPY     LEFT HEART CATHETERIZATION WITH CORONARY ANGIOGRAM N/A 03/08/2011   Procedure: LEFT HEART CATHETERIZATION WITH CORONARY ANGIOGRAM;  Surgeon: Pamella Pert, MD;  Location: [facility] CATH LAB;  Service: Cardiovascular;  Laterality: N/A;   PERCUTANEOUS CORONARY STENT INTERVENTION (PCI-S) N/A 04/06/2011   Procedure: PERCUTANEOUS CORONARY STENT INTERVENTION (PCI-S);  Surgeon: Pamella Pert, MD;  Location: [facility] CATH LAB;  Service: Cardiovascular;  Laterality: N/A;   TOTAL SHOULDER ARTHROPLASTY Right 01/07/2022   Procedure: TOTAL  SHOULDER ARTHROPLASTY;  Surgeon: Francena Hanly, MD;  Location: [facility];  Service: Orthopedics;  Laterality: Right;     FAMILY HISTORY: Family History  Problem Relation Age of Onset   Emphysema Mother    COPD Mother    Alzheimer's disease Father    Asthma Paternal Aunt    Heart attack Paternal Grandfather     SOCIAL HISTORY:  Social History   Socioeconomic History   Marital status: Married    Spouse  name: Bonita Quin   Number of children: 2   Years of education: College   Highest education level: Not on file  Occupational History   Occupation: retired    Comment: Firefighter  Tobacco Use   Smoking status: Never   Smokeless tobacco: Never  Vaping Use   Vaping Use: Never used  Substance and Sexual Activity   Alcohol use: Yes    Comment: occasionally   Drug use: No   Sexual activity: Yes  Other Topics Concern   Not on file  Social History Narrative   Patient lives at home with his spouse.   Caffeine Use: occasionally   Social Determinants of Health   Financial Resource Strain: Not on file  Food Insecurity: Not on file  Transportation Needs: Not on file  Physical Activity: Not on file  Stress: Not on file  Social Connections: Not on file  Intimate Partner Violence: Not on file     PHYSICAL EXAM  GENERAL EXAM/CONSTITUTIONAL: Vitals:  Vitals:   08/03/22 1128  BP: 118/72  Pulse: 79  Weight: 210 lb (95.3 kg)  Height: 5\' 9"  (1.753 m)   Body mass index is 31.01 kg/m. Wt Readings from Last 3 Encounters:  08/03/22 210 lb (95.3 kg)  01/07/22 214 lb 15.2 oz (97.5 kg)  12/29/21 215 lb (97.5 kg)   Patient is in no distress; well developed, nourished and groomed; neck is supple  CARDIOVASCULAR: Examination of carotid arteries is normal; no carotid bruits Regular rate and rhythm, no murmurs Examination of peripheral vascular system by observation and palpation is normal  EYES: Ophthalmoscopic exam of optic discs and posterior segments is normal;  no papilledema or hemorrhages No results found.  MUSCULOSKELETAL: Gait, strength, tone, movements noted in Neurologic exam below  NEUROLOGIC: MENTAL STATUS:      No data to display         awake, alert, oriented to person, place and time recent and remote memory intact normal attention and concentration language fluent, comprehension intact, naming intact fund of knowledge appropriate  CRANIAL NERVE:  2nd - no papilledema on fundoscopic exam 2nd, 3rd, 4th, 6th - pupils equal and reactive to light, visual fields full to confrontation, extraocular muscles intact, no nystagmus 5th - facial sensation symmetric 7th - facial strength symmetric 8th - hearing intact 9th - palate elevates symmetrically, uvula midline 11th - shoulder shrug symmetric 12th - tongue protrusion midline  MOTOR:  normal bulk and tone BUE (RIGHT DELTOID 4-, LEFT DELTOID 4); OTHERWISE 5 BLE 4+ PROX; RIGHT DF 3, LEFT DF 4-  SENSORY:  normal and symmetric to light touch, temperature, vibration; EXCEPT ABSENT IN FEET / ANKLES  COORDINATION:  finger-nose-finger, fine finger movements normal  REFLEXES:  deep tendon reflexes 1+ and symmetric; ABSENT AT ANKLES  GAIT/STATION:  narrow based gait; SLOW; UNSTEADY; CANNOT TOE OR HEEL WALK; ROMBERG NEGATIVE     DIAGNOSTIC DATA (LABS, IMAGING, TESTING) - I reviewed patient records, labs, notes, testing and imaging myself where available.  Lab Results  Component Value Date   WBC 8.4 12/29/2021   HGB 16.9 12/29/2021   HCT 53.6 (H) 12/29/2021   MCV 94.4 12/29/2021   PLT 112 (L) 12/29/2021      Component Value Date/Time   NA 139 12/29/2021 1456   NA 140 09/22/2015 1526   K 4.7 12/29/2021 1456   K 3.9 09/22/2015 1526   CL 106 12/29/2021 1456   CO2 22 12/29/2021 1456   CO2 27 09/22/2015 1526   GLUCOSE  131 (H) 12/29/2021 1456   GLUCOSE 75 09/22/2015 1526   BUN 18 12/29/2021 1456   BUN 15.8 09/22/2015 1526   CREATININE 0.95 12/29/2021 1456    CREATININE 0.88 02/09/2021 1348   CREATININE 0.8 09/22/2015 1526   CALCIUM 9.2 12/29/2021 1456   CALCIUM 9.0 09/22/2015 1526   PROT 6.7 02/09/2021 1348   PROT 6.9 09/22/2015 1526   PROT 6.4 09/22/2015 1525   ALBUMIN 4.2 10/26/2016 1512   ALBUMIN 3.7 09/22/2015 1526   AST 17 02/09/2021 1348   AST 18 09/22/2015 1526   ALT 12 02/09/2021 1348   ALT 16 09/22/2015 1526   ALKPHOS 76 10/26/2016 1512   ALKPHOS 77 09/22/2015 1526   BILITOT 0.4 02/09/2021 1348   BILITOT 0.47 09/22/2015 1526   GFRNONAA >60 12/29/2021 1456   GFRNONAA 90 07/10/2020 0000   GFRAA 104 07/10/2020 0000   Lab Results  Component Value Date   CHOL 115 06/23/2016   HDL 40 (L) 06/23/2016   LDLCALC 59 06/23/2016   TRIG 80 06/23/2016   CHOLHDL 2.9 06/23/2016   Lab Results  Component Value Date   HGBA1C 6.8 (H) 12/29/2021   No results found for: "VITAMINB12"  No results found for: "TSH"   06/22/16 MRI brain 1. No acute intracranial abnormality identified. 2. Mild chronic microvascular ischemic changes and mild parenchymal volume loss of the brain. 3. Left parietal lobe punctate susceptibility focus may represent hemosiderin deposition from old microhemorrhage or a tiny cavernoma.    ASSESSMENT AND PLAN  72 y.o. year old male here with:  Dx:   PLAN:  GAIT DIFFICULTY / NUMBNESS / WEAKNESS (likely related to diabetic neuropathy; also deconditioning) - continue DM control - check B12 level (per PCP) - consider PT evaluation - consider cane as needed  Return for pending if symptoms worsen or fail to improve, return to PCP.   Suanne Marker, MD 08/03/2022, 12:00 PM Certified in Neurology, Neurophysiology and Neuroimaging  [facility] [address] [place], [place] [address] [phone]

## 2022-08-03 NOTE — Patient Instructions (Addendum)
  GAIT DIFFICULTY / NUMBNESS / WEAKNESS (likely related to diabetic neuropathy; also deconditioning) - continue DM control - check B12 level (per PCP) - consider PT evaluation - consider cane as needed

## 2022-08-21 ENCOUNTER — Encounter (HOSPITAL_BASED_OUTPATIENT_CLINIC_OR_DEPARTMENT_OTHER): Payer: Self-pay | Admitting: Emergency Medicine

## 2022-08-21 ENCOUNTER — Other Ambulatory Visit: Payer: Self-pay

## 2022-08-21 ENCOUNTER — Emergency Department (HOSPITAL_BASED_OUTPATIENT_CLINIC_OR_DEPARTMENT_OTHER): Payer: Medicare HMO

## 2022-08-21 ENCOUNTER — Inpatient Hospital Stay (HOSPITAL_BASED_OUTPATIENT_CLINIC_OR_DEPARTMENT_OTHER)
Admission: EM | Admit: 2022-08-21 | Discharge: 2022-08-29 | DRG: 871 | Disposition: A | Discharge status: Home Health | Payer: Medicare HMO | Attending: Internal Medicine | Admitting: Internal Medicine

## 2022-08-21 DIAGNOSIS — J45909 Unspecified asthma, uncomplicated: Secondary | ICD-10-CM | POA: Diagnosis present

## 2022-08-21 DIAGNOSIS — E876 Hypokalemia: Secondary | ICD-10-CM | POA: Diagnosis present

## 2022-08-21 DIAGNOSIS — J90 Pleural effusion, not elsewhere classified: Secondary | ICD-10-CM

## 2022-08-21 DIAGNOSIS — Z820 Family history of epilepsy and other diseases of the nervous system: Secondary | ICD-10-CM | POA: Diagnosis not present

## 2022-08-21 DIAGNOSIS — U071 COVID-19: Secondary | ICD-10-CM | POA: Diagnosis not present

## 2022-08-21 DIAGNOSIS — J9811 Atelectasis: Secondary | ICD-10-CM | POA: Diagnosis not present

## 2022-08-21 DIAGNOSIS — R4182 Altered mental status, unspecified: Secondary | ICD-10-CM | POA: Diagnosis not present

## 2022-08-21 DIAGNOSIS — Z8249 Family history of ischemic heart disease and other diseases of the circulatory system: Secondary | ICD-10-CM | POA: Diagnosis not present

## 2022-08-21 DIAGNOSIS — Z825 Family history of asthma and other chronic lower respiratory diseases: Secondary | ICD-10-CM

## 2022-08-21 DIAGNOSIS — I2489 Other forms of acute ischemic heart disease: Secondary | ICD-10-CM | POA: Insufficient documentation

## 2022-08-21 DIAGNOSIS — Z794 Long term (current) use of insulin: Secondary | ICD-10-CM

## 2022-08-21 DIAGNOSIS — I255 Ischemic cardiomyopathy: Secondary | ICD-10-CM | POA: Diagnosis present

## 2022-08-21 DIAGNOSIS — A419 Sepsis, unspecified organism: Secondary | ICD-10-CM | POA: Diagnosis not present

## 2022-08-21 DIAGNOSIS — Z96611 Presence of right artificial shoulder joint: Secondary | ICD-10-CM | POA: Diagnosis present

## 2022-08-21 DIAGNOSIS — E782 Mixed hyperlipidemia: Secondary | ICD-10-CM | POA: Diagnosis present

## 2022-08-21 DIAGNOSIS — R0602 Shortness of breath: Secondary | ICD-10-CM | POA: Diagnosis not present

## 2022-08-21 DIAGNOSIS — Z79899 Other long term (current) drug therapy: Secondary | ICD-10-CM

## 2022-08-21 DIAGNOSIS — E119 Type 2 diabetes mellitus without complications: Secondary | ICD-10-CM

## 2022-08-21 DIAGNOSIS — G9341 Metabolic encephalopathy: Secondary | ICD-10-CM | POA: Insufficient documentation

## 2022-08-21 DIAGNOSIS — E11319 Type 2 diabetes mellitus with unspecified diabetic retinopathy without macular edema: Secondary | ICD-10-CM | POA: Diagnosis not present

## 2022-08-21 DIAGNOSIS — I10 Essential (primary) hypertension: Secondary | ICD-10-CM | POA: Diagnosis present

## 2022-08-21 DIAGNOSIS — Z7984 Long term (current) use of oral hypoglycemic drugs: Secondary | ICD-10-CM

## 2022-08-21 DIAGNOSIS — G319 Degenerative disease of nervous system, unspecified: Secondary | ICD-10-CM | POA: Diagnosis not present

## 2022-08-21 DIAGNOSIS — Z951 Presence of aortocoronary bypass graft: Secondary | ICD-10-CM

## 2022-08-21 DIAGNOSIS — I5043 Acute on chronic combined systolic (congestive) and diastolic (congestive) heart failure: Secondary | ICD-10-CM | POA: Diagnosis not present

## 2022-08-21 DIAGNOSIS — Z955 Presence of coronary angioplasty implant and graft: Secondary | ICD-10-CM

## 2022-08-21 DIAGNOSIS — R531 Weakness: Secondary | ICD-10-CM | POA: Diagnosis not present

## 2022-08-21 DIAGNOSIS — G459 Transient cerebral ischemic attack, unspecified: Secondary | ICD-10-CM

## 2022-08-21 DIAGNOSIS — Z8673 Personal history of transient ischemic attack (TIA), and cerebral infarction without residual deficits: Secondary | ICD-10-CM

## 2022-08-21 DIAGNOSIS — Z7982 Long term (current) use of aspirin: Secondary | ICD-10-CM

## 2022-08-21 DIAGNOSIS — I5020 Unspecified systolic (congestive) heart failure: Secondary | ICD-10-CM | POA: Diagnosis not present

## 2022-08-21 DIAGNOSIS — I110 Hypertensive heart disease with heart failure: Secondary | ICD-10-CM | POA: Diagnosis present

## 2022-08-21 DIAGNOSIS — J1282 Pneumonia due to coronavirus disease 2019: Secondary | ICD-10-CM | POA: Diagnosis not present

## 2022-08-21 DIAGNOSIS — I2510 Atherosclerotic heart disease of native coronary artery without angina pectoris: Secondary | ICD-10-CM | POA: Diagnosis present

## 2022-08-21 DIAGNOSIS — R9431 Abnormal electrocardiogram [ECG] [EKG]: Secondary | ICD-10-CM | POA: Diagnosis not present

## 2022-08-21 DIAGNOSIS — A4189 Other specified sepsis: Principal | ICD-10-CM | POA: Diagnosis present

## 2022-08-21 DIAGNOSIS — G9349 Other encephalopathy: Secondary | ICD-10-CM | POA: Diagnosis not present

## 2022-08-21 DIAGNOSIS — J3489 Other specified disorders of nose and nasal sinuses: Secondary | ICD-10-CM | POA: Diagnosis not present

## 2022-08-21 DIAGNOSIS — I5033 Acute on chronic diastolic (congestive) heart failure: Secondary | ICD-10-CM | POA: Insufficient documentation

## 2022-08-21 LAB — CBC WITH DIFFERENTIAL/PLATELET
Abs Immature Granulocytes: 0.05 10*3/uL (ref 0.00–0.07)
Basophils Absolute: 0 10*3/uL (ref 0.0–0.1)
Basophils Relative: 0 %
Eosinophils Absolute: 0 10*3/uL (ref 0.0–0.5)
Eosinophils Relative: 0 %
HCT: 47.6 % (ref 39.0–52.0)
Hemoglobin: 15.4 g/dL (ref 13.0–17.0)
Immature Granulocytes: 1 %
Lymphocytes Relative: 14 %
Lymphs Abs: 1.4 10*3/uL (ref 0.7–4.0)
MCH: 28.4 pg (ref 26.0–34.0)
MCHC: 32.4 g/dL (ref 30.0–36.0)
MCV: 87.8 fL (ref 80.0–100.0)
Monocytes Absolute: 1.4 10*3/uL — ABNORMAL HIGH (ref 0.1–1.0)
Monocytes Relative: 15 %
Neutro Abs: 6.8 10*3/uL (ref 1.7–7.7)
Neutrophils Relative %: 70 %
Platelets: 192 10*3/uL (ref 150–400)
RBC: 5.42 MIL/uL (ref 4.22–5.81)
RDW: 13.9 % (ref 11.5–15.5)
WBC: 9.7 10*3/uL (ref 4.0–10.5)
nRBC: 0 % (ref 0.0–0.2)

## 2022-08-21 LAB — COMPREHENSIVE METABOLIC PANEL
ALT: 11 U/L (ref 0–44)
AST: 15 U/L (ref 15–41)
Albumin: 4.2 g/dL (ref 3.5–5.0)
Alkaline Phosphatase: 72 U/L (ref 38–126)
Anion gap: 14 (ref 5–15)
BUN: 13 mg/dL (ref 8–23)
CO2: 21 mmol/L — ABNORMAL LOW (ref 22–32)
Calcium: 9 mg/dL (ref 8.9–10.3)
Chloride: 101 mmol/L (ref 98–111)
Creatinine, Ser: 0.69 mg/dL (ref 0.61–1.24)
GFR, Estimated: >60 mL/min (ref >60)
Glucose, Bld: 126 mg/dL — ABNORMAL HIGH (ref 70–99)
Potassium: 3.7 mmol/L (ref 3.5–5.1)
Sodium: 136 mmol/L (ref 135–145)
Total Bilirubin: 0.9 mg/dL (ref 0.3–1.2)
Total Protein: 7 g/dL (ref 6.5–8.1)

## 2022-08-21 LAB — I-STAT VENOUS BLOOD GAS, ED
Acid-base deficit: 3 mmol/L — ABNORMAL HIGH (ref 0.0–2.0)
Bicarbonate: 21.5 mmol/L (ref 20.0–28.0)
Calcium, Ion: 1.14 mmol/L — ABNORMAL LOW (ref 1.15–1.40)
HCT: 46 % (ref 39.0–52.0)
Hemoglobin: 15.6 g/dL (ref 13.0–17.0)
O2 Saturation: 73 %
Patient temperature: 101.2
Potassium: 3.8 mmol/L (ref 3.5–5.1)
Sodium: 138 mmol/L (ref 135–145)
TCO2: 23 mmol/L (ref 22–32)
pCO2, Ven: 38.1 mmHg — ABNORMAL LOW (ref 44–60)
pH, Ven: 7.366 (ref 7.25–7.43)
pO2, Ven: 43 mmHg (ref 32–45)

## 2022-08-21 LAB — URINALYSIS, W/ REFLEX TO CULTURE (INFECTION SUSPECTED)
Bacteria, UA: NONE SEEN
Bilirubin Urine: NEGATIVE
Glucose, UA: 500 mg/dL — AB
Ketones, ur: >80 mg/dL — AB
Leukocytes,Ua: NEGATIVE
Nitrite: NEGATIVE
Protein, ur: 30 mg/dL — AB
Specific Gravity, Urine: 1.031 — ABNORMAL HIGH (ref 1.005–1.030)
pH: 5.5 (ref 5.0–8.0)

## 2022-08-21 LAB — LACTIC ACID, PLASMA
Lactic Acid, Venous: 1.2 mmol/L (ref 0.5–1.9)
Lactic Acid, Venous: 1.2 mmol/L (ref 0.5–1.9)

## 2022-08-21 LAB — TROPONIN I (HIGH SENSITIVITY)
Troponin I (High Sensitivity): 57 ng/L — ABNORMAL HIGH (ref <18)
Troponin I (High Sensitivity): 58 ng/L — ABNORMAL HIGH (ref <18)

## 2022-08-21 LAB — RESP PANEL BY RT-PCR (RSV, FLU A&B, COVID)  RVPGX2
Influenza A by PCR: NEGATIVE
Influenza B by PCR: NEGATIVE
Resp Syncytial Virus by PCR: NEGATIVE
SARS Coronavirus 2 by RT PCR: POSITIVE — AB

## 2022-08-21 LAB — CBC
HCT: 46.7 % (ref 39.0–52.0)
Hemoglobin: 15 g/dL (ref 13.0–17.0)
MCH: 29 pg (ref 26.0–34.0)
MCHC: 32.1 g/dL (ref 30.0–36.0)
MCV: 90.2 fL (ref 80.0–100.0)
Platelets: 170 10*3/uL (ref 150–400)
RBC: 5.18 MIL/uL (ref 4.22–5.81)
RDW: 13.9 % (ref 11.5–15.5)
WBC: 9.8 10*3/uL (ref 4.0–10.5)
nRBC: 0 % (ref 0.0–0.2)

## 2022-08-21 LAB — PROTIME-INR
INR: 1.1 (ref 0.8–1.2)
Prothrombin Time: 14.7 seconds (ref 11.4–15.2)

## 2022-08-21 LAB — CBG MONITORING, ED: Glucose-Capillary: 123 mg/dL — ABNORMAL HIGH (ref 70–99)

## 2022-08-21 LAB — CREATININE, SERUM
Creatinine, Ser: 0.8 mg/dL (ref 0.61–1.24)
GFR, Estimated: >60 mL/min (ref >60)

## 2022-08-21 LAB — APTT: aPTT: 30 seconds (ref 24–36)

## 2022-08-21 MED ORDER — ACETAMINOPHEN 325 MG PO TABS
650.0000 mg | ORAL_TABLET | Freq: Four times a day (QID) | ORAL | Status: DC | PRN
  Administered 2022-08-22 – 2022-08-28 (×4): 650 mg via ORAL
  Filled 2022-08-21 (×4): qty 2

## 2022-08-21 MED ORDER — SODIUM CHLORIDE 0.9 % IV SOLN
2.0000 g | INTRAVENOUS | Status: DC
  Administered 2022-08-21: 2 g via INTRAVENOUS
  Filled 2022-08-21: qty 20

## 2022-08-21 MED ORDER — LACTATED RINGERS IV SOLN: INTRAVENOUS | Status: DC

## 2022-08-21 MED ORDER — IOHEXOL 350 MG/ML SOLN
100.0000 mL | Freq: Once | INTRAVENOUS | Status: AC | PRN
  Administered 2022-08-21: 75 mL via INTRAVENOUS

## 2022-08-21 MED ORDER — FUROSEMIDE 10 MG/ML IJ SOLN
40.0000 mg | Freq: Once | INTRAMUSCULAR | Status: AC
  Administered 2022-08-21: 40 mg via INTRAVENOUS
  Filled 2022-08-21: qty 4

## 2022-08-21 MED ORDER — ACETAMINOPHEN 650 MG RE SUPP: 650.0000 mg | Freq: Four times a day (QID) | RECTAL | Status: DC | PRN

## 2022-08-21 MED ORDER — ENOXAPARIN SODIUM 40 MG/0.4ML IJ SOSY
40.0000 mg | PREFILLED_SYRINGE | INTRAMUSCULAR | Status: DC
  Administered 2022-08-21 – 2022-08-28 (×8): 40 mg via SUBCUTANEOUS
  Filled 2022-08-21 (×8): qty 0.4

## 2022-08-21 MED ORDER — ACETAMINOPHEN 500 MG PO TABS
1000.0000 mg | ORAL_TABLET | Freq: Once | ORAL | Status: AC
  Administered 2022-08-21: 1000 mg via ORAL
  Filled 2022-08-21: qty 2

## 2022-08-21 MED ORDER — CARVEDILOL 6.25 MG PO TABS
6.2500 mg | ORAL_TABLET | Freq: Two times a day (BID) | ORAL | Status: DC
  Administered 2022-08-22 – 2022-08-29 (×15): 6.25 mg via ORAL
  Filled 2022-08-21 (×15): qty 1

## 2022-08-21 MED ORDER — ASPIRIN 81 MG PO TBEC
81.0000 mg | DELAYED_RELEASE_TABLET | Freq: Every day | ORAL | Status: DC
  Administered 2022-08-22 – 2022-08-28 (×7): 81 mg via ORAL
  Filled 2022-08-21 (×8): qty 1

## 2022-08-21 MED ORDER — SODIUM CHLORIDE 0.9 % IV SOLN
500.0000 mg | INTRAVENOUS | Status: DC
  Administered 2022-08-21: 500 mg via INTRAVENOUS
  Filled 2022-08-21: qty 5

## 2022-08-21 MED ORDER — FUROSEMIDE 10 MG/ML IJ SOLN
40.0000 mg | Freq: Once | INTRAMUSCULAR | Status: AC
  Administered 2022-08-22: 40 mg via INTRAVENOUS
  Filled 2022-08-21: qty 4

## 2022-08-21 NOTE — ED Notes (Signed)
Blood cultures drawn before antibiotics started 

## 2022-08-21 NOTE — Progress Notes (Signed)
[facility] following for sepsis protocol. 

## 2022-08-21 NOTE — ED Provider Notes (Signed)
EMERGENCY DEPARTMENT AT [facility] Provider Note   CSN: [id number] Arrival date & time: 08/21/22  1334     History  Chief Complaint  Patient presents with   Altered Mental Status    Alan Wang is a 72 y.o. male history of CAD, CABG, type 2 diabetes, hypertension, hypercholesterolemia, TIA presented with weakness and confusion for the past 2 weeks.  Patient just returned from [place] and since has been having the symptoms.  Wife was present to assist in history and states that her husband has become increasingly confused i.e. asking her to take him to the mailbox.  Patient also fell on Tuesday from a wheelchair but did not hit his head or LOC since then wife states patient has seemed unbalanced when he walks and patient agrees.  Patient states both of his legs feel weak along with both hands and that his hands feel weaker than his legs.  Patient states that he can get up to go to the bathroom and soiled himself.  Last known normal according to wife was yesterday 8 AM.  Patient denies chest pain, neck pain, headache, vision changes  Home Medications Prior to Admission medications   Medication Sig Start Date End Date Taking? Authorizing Provider  acetaminophen (TYLENOL) 650 MG CR tablet Take 1,300 mg by mouth 2 (two) times daily.    [provider]  Ascorbic Acid (VITAMIN C) 1000 MG tablet Take 1,000 mg by mouth daily.    [provider]  aspirin EC 81 MG tablet Take 81 mg by mouth at bedtime.    [provider]  carvedilol (COREG) 12.5 MG tablet Take 1 tablet (12.5 mg total) by mouth 2 (two) times daily with a meal. 02/08/22   Yates Decamp, MD  chlorpheniramine (CHLOR-TRIMETON) 4 MG tablet Take 4 mg by mouth at bedtime.    [provider]  Cholecalciferol (VITAMIN D) 2000 units CAPS Take 2,000 Units by mouth daily.    [provider]  CINNAMON PO Take 2,000 mg by mouth 2 (two) times daily.    [provider]   cyclobenzaprine (FLEXERIL) 10 MG tablet Take 10 mg by mouth at bedtime as needed. 06/09/22   [provider]  docusate sodium (COLACE) 100 MG capsule Take 100 mg by mouth daily.    [provider]  DULoxetine (CYMBALTA) 30 MG capsule Take 30 mg by mouth at bedtime.    [provider]  FARXIGA 10 MG TABS tablet Take 5 mg by mouth daily. 02/10/17   [provider]  insulin NPH (HUMULIN N,NOVOLIN N) 100 UNIT/ML injection Inject 10-35 Units into the skin See admin instructions. 10 units in morning  35 units before dinner    [provider]  insulin regular (NOVOLIN R,HUMULIN R) 100 units/mL injection Inject 20-25 Units into the skin 2 (two) times daily before a meal. 20 units in the AM ans 25 units at dinner    [provider]  MAGNESIUM PO Take 500 mg by mouth daily.    [provider]  metFORMIN (GLUCOPHAGE-XR) 500 MG 24 hr tablet Take 2 tablets (1,000 mg total) by mouth 2 (two) times daily. 09/18/15   Yates Decamp, MD  nitroGLYCERIN (NITROSTAT) 0.4 MG SL tablet Place 1 tablet (0.4 mg total) under the tongue every 5 (five) minutes as needed for up to 25 days for chest pain. 10/30/20 12/29/21  Cantwell, Celeste C, PA-C  olmesartan (BENICAR) 20 MG tablet TAKE ONE TABLET BY MOUTH DAILY 03/31/22  Yates Decamp, MD  ondansetron (ZOFRAN) 4 MG tablet Take 1 tablet (4 mg total) by mouth every 8 (eight) hours as needed for nausea or vomiting. 01/07/22   Shuford, French Ana, PA-C  ONETOUCH VERIO test strip USE TO TEST BLOOD SUGAR 4 TIMES DAILY 04/17/19   [provider]  oxyCODONE-acetaminophen (PERCOCET) 5-325 MG tablet Take 1 tablet by mouth every 4 (four) hours as needed for severe pain (max 6 q). 01/07/22   Shuford, French Ana, PA-C  PRALUENT 75 MG/ML SOAJ Inject 75 mg into the skin every 14 (fourteen) days. 12/28/21   [provider]  REPATHA SURECLICK 140 MG/ML SOAJ Inject 140 mg as directed 2 (two) times a week. 05/31/22   [provider]   tiZANidine (ZANAFLEX) 4 MG tablet Take 1 tablet (4 mg total) by mouth 3 (three) times daily. 01/07/22 01/07/23  Shuford, French Ana, PA-C  traMADol (ULTRAM) 50 MG tablet Take 50 mg by mouth every 6 (six) hours as needed for moderate pain. Prescribed by PCP.    [provider]  traMADol (ULTRAM) 50 MG tablet Take 1 tablet (50 mg total) by mouth every 6 (six) hours as needed for moderate pain. 01/07/22   Shuford, French Ana, PA-C  TURMERIC PO Take 1 capsule by mouth daily.    [provider]      Allergies    Avandia [rosiglitazone maleate]    Review of Systems   Review of Systems See HPI Physical Exam Updated Vital Signs BP (!) 154/93   Pulse 94   Temp 98 F (36.7 C) (Axillary)   Resp (!) 25   Ht 5\' 10"  (1.778 m)   Wt 92.5 kg Comment: stated per pt  SpO2 93%   BMI 29.27 kg/m  Physical Exam Constitutional:      General: He is not in acute distress. Eyes:     Extraocular Movements: Extraocular movements intact.     Conjunctiva/sclera: Conjunctivae normal.     Pupils: Pupils are equal, round, and reactive to light.  Cardiovascular:     Rate and Rhythm: Regular rhythm. Tachycardia present.     Pulses: Normal pulses.     Heart sounds: Normal heart sounds.     Comments: 2+ bilateral radial pulses with slightly increased rate Pulmonary:     Effort: Pulmonary effort is normal. No respiratory distress.     Breath sounds: Normal breath sounds.  Abdominal:     Palpations: Abdomen is soft.     Tenderness: There is no abdominal tenderness. There is no guarding or rebound.  Musculoskeletal:        General: Normal range of motion.     Comments: 4/5 strength bilateral hip flexion/grip strength  Skin:    General: Skin is warm and dry.  Neurological:     Mental Status: He is alert.     Sensory: Sensation is intact.     Motor: Motor function is intact.     Coordination: Coordination is intact.     Comments: Alert and oriented x 4 to person, place, year, color Able to give  thoughtful responses Sensation intact in all 4 limbs Vision grossly intact Cranial nerves III through XII intact   Psychiatric:        Mood and Affect: Mood normal.     ED Results / Procedures / Treatments   Labs (all labs ordered are listed, but only abnormal results are displayed) Labs Reviewed  RESP PANEL BY RT-PCR (RSV, FLU A&B, COVID)  RVPGX2 - Abnormal; Notable for the following components:  Result Value   SARS Coronavirus 2 by RT PCR POSITIVE (*)    All other components within normal limits  COMPREHENSIVE METABOLIC PANEL - Abnormal; Notable for the following components:   CO2 21 (*)    Glucose, Bld 126 (*)    All other components within normal limits  CBC WITH DIFFERENTIAL/PLATELET - Abnormal; Notable for the following components:   Monocytes Absolute 1.4 (*)    All other components within normal limits  CBG MONITORING, ED - Abnormal; Notable for the following components:   Glucose-Capillary 123 (*)    All other components within normal limits  I-STAT VENOUS BLOOD GAS, ED - Abnormal; Notable for the following components:   pCO2, Ven 38.1 (*)    Acid-base deficit 3.0 (*)    Calcium, Ion 1.14 (*)    All other components within normal limits  TROPONIN I (HIGH SENSITIVITY) - Abnormal; Notable for the following components:   Troponin I (High Sensitivity) 57 (*)    All other components within normal limits  CULTURE, BLOOD (ROUTINE X 2)  CULTURE, BLOOD (ROUTINE X 2)  LACTIC ACID, PLASMA  PROTIME-INR  APTT  LACTIC ACID, PLASMA  URINALYSIS, W/ REFLEX TO CULTURE (INFECTION SUSPECTED)  TROPONIN I (HIGH SENSITIVITY)    EKG EKG Interpretation  Date/Time:  Saturday August 21 2022 13:55:42 EDT Ventricular Rate:  103 PR Interval:  149 QRS Duration: 88 QT Interval:  348 QTC Calculation: 456 R Axis:   2 Text Interpretation: Sinus tachycardia Low voltage, extremity leads Nonspecific T abnormalities, lateral leads Confirmed by Lorre Nick ([id number]) on 08/21/2022 2:26:46  PM  Radiology DG Chest Port 1 View  Result Date: 08/21/2022 CLINICAL DATA:  Shortness of breath EXAM: PORTABLE CHEST 1 VIEW COMPARISON:  11/16/2012 FINDINGS: Transverse diameter of heart is increased. There are no signs of pulmonary edema. There is no focal pulmonary consolidation. Apparent shift of mediastinum to the right may be due to rotation. Low lung volumes. There is no significant pleural effusion or pneumothorax. There is previous arthroplasty and right shoulder. IMPRESSION: Cardiomegaly. There are no signs of pulmonary edema or focal pulmonary consolidation. Low lung volumes limit evaluation of lower lung fields. Electronically Signed   By: Ernie Avena M.D.   On: 08/21/2022 14:59    Procedures .Critical Care  Performed by: Netta Corrigan, PA-C Authorized by: Netta Corrigan, PA-C   Critical care provider statement:    Critical care time (minutes):  40   Critical care time was exclusive of:  Separately billable procedures and treating other patients   Critical care was necessary to treat or prevent imminent or life-threatening deterioration of the following conditions:  Sepsis   Critical care was time spent personally by me on the following activities:  Development of treatment plan with patient or surrogate, discussions with consultants, evaluation of patient's response to treatment, examination of patient, obtaining history from patient or surrogate, review of old charts, re-evaluation of patient's condition, pulse oximetry, ordering and review of radiographic studies, ordering and review of laboratory studies, ordering and performing treatments and interventions and blood draw for specimens   I assumed direction of critical care for this patient from another provider in my specialty: no     Care discussed with: admitting provider       Medications Ordered in ED Medications  lactated ringers infusion ( Intravenous New Bag/Given 08/21/22 1504)  cefTRIAXone (ROCEPHIN) 2 g  in sodium chloride 0.9 % 100 mL IVPB (0 g Intravenous Stopped 08/21/22 1610)  azithromycin (ZITHROMAX) 500  mg in sodium chloride 0.9 % 250 mL IVPB (0 mg Intravenous Stopped 08/21/22 1652)  furosemide (LASIX) injection 40 mg (has no administration in time range)  acetaminophen (TYLENOL) tablet 1,000 mg (1,000 mg Oral Given 08/21/22 1510)  iohexol (OMNIPAQUE) 350 MG/ML injection 100 mL (75 mLs Intravenous Contrast Given 08/21/22 1523)    ED Course/ Medical Decision Making/ A&P                             Medical Decision Making Amount and/or Complexity of Data Reviewed Labs: ordered. Radiology: ordered.  Risk OTC drugs. Prescription drug management.   [place] 72 y.o. presented today for AMS. Working DDx that I considered at this time includes, but not limited to, CVA, ICH, intracranial mass, critical dehydration, heptatic dysfunction, uremia, hypercarbia, intoxication, endocrine abnormality, toxidrome, pneumonia, viral illness pleural effusion.  Review of prior external notes: 02/10/2022 progress Notes  Unique Tests and My Interpretation:  CT Head wo Contrast: Bilateral ethmoid sinus disease Troponin: 57 CTA chest PE study: Bilateral moderate pleural effusions with compressive atelectasis Respiratory panel: COVID-positive CBG: 123 APTT: Unremarkable PT/INR: Unremarkable CBC: Unremarkable CMP: Unremarkable UA: Pending CXR: Cardiomegaly, no signs of consolidation or pleural effusion VBG: Unremarkable EKG: Rate, rhythm, axis, intervals all examined and without medically relevant abnormality. ST segments without concerns for elevations  Discussion with Independent Historian:  Wife  Discussion of Management of Tests:  Rulon Eisenmenger, MD Hospitalist  Risk: High: hospitalization or escalation of hospital-level care  Risk Stratification Score: None  Staffed with Freida Busman, MD  R/o DDx: CVA, ICH, intracranial mass, critical dehydration, heptatic dysfunction, uremia, hypercarbia,  intoxication, endocrine abnormality, toxidrome: These diagnoses are not consistent with patient's history, physical exam, lab/imaging  Plan: Patient presented for altered mental status, weakness.  On exam patient did present septic with a temperature of 101.2 F, tachycardic 102, tachypneic 27 RR.  Patient's neuroexam was reassuring and so the suspicion for stroke at this time highly suspect patient's AMS and weakness is related to a possible pneumonia.  Sepsis protocol was initiated and so antibiotics were started.  Upon chart review patient's last echo from 02/23/2021 shows LVEF 24% and so fluids were withheld at this time to prevent flash pulmonary edema pending patient's lactic.  Anticipate patient's admission for sepsis secondary to pneumonia.  Patient's COVID test came back positive which would explain his shortness of breath.  Patient CTA of his chest also showed that he has moderate bilateral occlusions which would also explain shortness of breath.  Patient troponin was elevated at 57 with no other previous results to compare.  Suspect elevated troponin is due to demand ischemia in the setting of shortness of breath versus ACS however will await second troponin.  At this time patient will be admitted to medicine.  Patient stable for admission at this time.  I spoke to the hospitalist and patient was accepted for admission.  Patient stable for admission at this time.        Final Clinical Impression(s) / ED Diagnoses Final diagnoses:  Sepsis, due to unspecified organism, unspecified whether acute organ dysfunction present [facility])  COVID  Pleural effusion    Rx / DC Orders ED Discharge Orders     None         Remi Deter 08/21/22 1710    Lorre Nick, MD 08/22/22 [phone]

## 2022-08-21 NOTE — ED Notes (Signed)
[facility] at bedside to transport pt to [facility] 

## 2022-08-21 NOTE — ED Notes (Signed)
Pt incontinent of urine. Sheets and blankets replaced, peri care performed, condom cath applied

## 2022-08-21 NOTE — H&P (Signed)
History and Physical    Alan Wang:[id number] DOB: 02/21/1951 DOA: 08/21/2022  PCP: Henrine Screws, MD   Chief Complaint: Alan Wang  HPI: Alan Wang is a 72 y.o. male with medical history significant of CAD, type 2 diabetes, hyperlipidemia, hypertension, prior TIA who presents emergency department with altered mental status.  Patient reports that he had a trip in [place] and has been having symptoms for the last 3 weeks.  He has been having worsening confusion.  His wife reported that he was acting strangely and brought him to the ER for further assessment.  On arrival he was afebrile hemodynamically stable.  Labs were pain which notable for lactic acid 1.2, sodium 132, creatinine 0.69, troponin 57, 58.  Respiratory viral panel was positive for COVID.  CMP was unrevealing.  Patient was admitted due to possible COVID encephalopathy.  Chest x-ray showed cardiomegaly.  CT head showed no acute findings.  CT pulmonary embolism study demonstrated no evidence of PE with bilateral pleural effusions suspicious for interstitial edema.  In the ER patient was started on ceftriaxone and azithromycin due to concern for underlying pneumonia.  He was given 40 mg of IV Lasix.  On assessment he was altered.  He he knew where he was however he would intermittently loses train of thought and forget what he was saying.  He denied any pain.  He states he has been favoring for the last 3 weeks.  He endorses prior COVID vaccination.  Review of chart reveals prior history of reduced EF.   Review of Systems: Review of Systems  All other systems reviewed and are negative.    As per HPI otherwise 10 point review of systems negative.   Allergies  Allergen Reactions   Avandia [Rosiglitazone Maleate] Swelling and Other (See Comments)    Lower extremities became swollen    Past Medical History:  Diagnosis Date   Arthritis    Asthma    Cancer (HCC)    skin   Coronary artery disease    COVID    Diabetes  mellitus    type 2, insulin dependent for 30+ years   History of kidney stones 11/2012   Hyperlipemia    Hypertension    IDDM (insulin dependent diabetes mellitus)    Shortness of breath    Stroke (HCC)    hx TIA    Past Surgical History:  Procedure Laterality Date   ANGIOPLASTY  04/06/2011   x3    CARDIAC CATHETERIZATION     CARDIAC CATHETERIZATION N/A 09/16/2015   Procedure: Left Heart Cath and Coronary Angiography;  Surgeon: Yates Decamp, MD;  Location: [facility] INVASIVE CV LAB;  Service: Cardiovascular;  Laterality: N/A;   cataracts     CHOLECYSTECTOMY     HERNIA REPAIR     x2   KNEE ARTHROSCOPY     LEFT HEART CATHETERIZATION WITH CORONARY ANGIOGRAM N/A 03/08/2011   Procedure: LEFT HEART CATHETERIZATION WITH CORONARY ANGIOGRAM;  Surgeon: Pamella Pert, MD;  Location: [facility] CATH LAB;  Service: Cardiovascular;  Laterality: N/A;   PERCUTANEOUS CORONARY STENT INTERVENTION (PCI-S) N/A 04/06/2011   Procedure: PERCUTANEOUS CORONARY STENT INTERVENTION (PCI-S);  Surgeon: Pamella Pert, MD;  Location: [facility] CATH LAB;  Service: Cardiovascular;  Laterality: N/A;   TOTAL SHOULDER ARTHROPLASTY Right 01/07/2022   Procedure: TOTAL SHOULDER ARTHROPLASTY;  Surgeon: Francena Hanly, MD;  Location: [facility] ORS;  Service: Orthopedics;  Laterality: Right;      reports that he has never smoked. He has never used smokeless tobacco.  He reports current alcohol use. He reports that he does not use drugs.  Family History  Problem Relation Age of Onset   Emphysema Mother    COPD Mother    Alzheimer's disease Father    Asthma Paternal Aunt    Heart attack Paternal Grandfather     Prior to Admission medications   Medication Sig Start Date End Date Taking? Authorizing Provider  acetaminophen (TYLENOL) 650 MG CR tablet Take 1,300 mg by mouth 2 (two) times daily.   Yes [provider]  Ascorbic Acid (VITAMIN C) 1000 MG tablet Take 1,000 mg by mouth daily.   Yes [provider]  aspirin EC 81  MG tablet Take 81 mg by mouth at bedtime.   Yes [provider]  carvedilol (COREG) 12.5 MG tablet Take 1 tablet (12.5 mg total) by mouth 2 (two) times daily with a meal. 02/08/22  Yes Yates Decamp, MD  celecoxib (CELEBREX) 200 MG capsule Take 200 mg by mouth daily.   Yes [provider]  chlorpheniramine (CHLOR-TRIMETON) 4 MG tablet Take 4 mg by mouth at bedtime.   Yes [provider]  Cholecalciferol (VITAMIN D) 2000 units CAPS Take 2,000 Units by mouth daily.   Yes [provider]  CINNAMON PO Take 2,000 mg by mouth 2 (two) times daily.   Yes [provider]  cyclobenzaprine (FLEXERIL) 10 MG tablet Take 10 mg by mouth at bedtime as needed for muscle spasms. 06/09/22  Yes [provider]  DULoxetine (CYMBALTA) 30 MG capsule Take 30 mg by mouth at bedtime.   Yes [provider]  FARXIGA 10 MG TABS tablet Take 5 mg by mouth daily. 02/10/17  Yes [provider]  insulin NPH (HUMULIN N,NOVOLIN N) 100 UNIT/ML injection Inject 10-35 Units into the skin See admin instructions. Inject 10 units into the skin before breakfast and 35 units before supper   Yes [provider]  insulin regular (NOVOLIN R,HUMULIN R) 100 units/mL injection Inject 20-25 Units into the skin See admin instructions. Inject 20 units into the skin before breakfast and 25 units before supper   Yes [provider]  Magnesium 500 MG TABS Take 500 mg by mouth daily.   Yes [provider]  metFORMIN (GLUCOPHAGE-XR) 500 MG 24 hr tablet Take 2 tablets (1,000 mg total) by mouth 2 (two) times daily. 09/18/15  Yes Yates Decamp, MD  nitroGLYCERIN (NITROSTAT) 0.4 MG SL tablet Place 1 tablet (0.4 mg total) under the tongue every 5 (five) minutes as needed for up to 25 days for chest pain. 10/30/20 08/21/22 Yes Cantwell, Celeste C, PA-C  olmesartan (BENICAR) 20 MG tablet TAKE ONE TABLET BY MOUTH DAILY 03/31/22  Yes Yates Decamp, MD  ondansetron (ZOFRAN) 4 MG tablet  Take 1 tablet (4 mg total) by mouth every 8 (eight) hours as needed for nausea or vomiting. 01/07/22  Yes Shuford, French Ana, PA-C  REPATHA SURECLICK 140 MG/ML SOAJ Inject 140 mg as directed every 14 (fourteen) days. 05/31/22  Yes [provider]  tiZANidine (ZANAFLEX) 4 MG tablet Take 1 tablet (4 mg total) by mouth 3 (three) times daily. Patient taking differently: Take 4 mg by mouth every 8 (eight) hours as needed for muscle spasms. 01/07/22 01/07/23 Yes Shuford, French Ana, PA-C  traMADol (ULTRAM) 50 MG tablet Take 1 tablet (50 mg total) by mouth every 6 (six) hours as needed for moderate pain. Patient taking differently: Take 100 mg by mouth every 6 (six) hours as needed for moderate pain. 01/07/22  Yes Shuford, French Ana, PA-C  TURMERIC PO Take 1 capsule by mouth daily.   Yes [provider]  ONETOUCH VERIO test strip USE TO TEST BLOOD SUGAR 4 TIMES DAILY 04/17/19   [provider]  oxyCODONE-acetaminophen (PERCOCET) 5-325 MG tablet Take 1 tablet by mouth every 4 (four) hours as needed for severe pain (max 6 q). Patient not taking: Reported on 08/21/2022 01/07/22   Ralene Bathe, PA-C    Physical Exam: Vitals:   08/21/22 1730 08/21/22 1800 08/21/22 1930 08/21/22 2030  BP: (!) 159/104 (!) 170/102 (!) 157/93 (!) 161/86  Pulse: 94 95 95 97  Resp: (!) 23 (!) 25 (!) 23 20  Temp:    99.9 F (37.7 C)  TempSrc:    Oral  SpO2: 91% 95% 96% 97%  Weight:      Height:       Physical Exam Constitutional:      Appearance: Normal appearance.  HENT:     Nose: Nose normal.     Mouth/Throat:     Mouth: Mucous membranes are moist.  Cardiovascular:     Rate and Rhythm: Normal rate and regular rhythm.  Pulmonary:     Effort: Pulmonary effort is normal.     Breath sounds: Normal breath sounds.  Abdominal:     General: Abdomen is flat.  Musculoskeletal:        General: Normal range of motion.     Cervical back: Normal range of motion.  Skin:    General: Skin is warm.     Capillary  Refill: Capillary refill takes less than 2 seconds.  Neurological:     General: No focal deficit present.     Mental Status: He is alert. He is disoriented.     Cranial Nerves: No cranial nerve deficit.     Sensory: No sensory deficit.  Psychiatric:        Mood and Affect: Mood normal.        Labs on Admission: I have personally reviewed the patients's labs and imaging studies.  Assessment/Plan Principal Problem:   Pneumonia due to COVID-19 virus   Acute encephalopathy most likely due to COVID, POA, active - Patient has history of prior CVA with vascular disease given history of ischemic cardiomyopathy - Patient presenting with progressively worsening confusion over the last several days - Recent exposure to multiple people and trip to [place]  Plan: Obtain MRI head Patient does not exhibit any evidence of meningitis on examination and has remained afebrile so we will hold off lumbar puncture at this time  Acute on chronic heart failure with reduced ejection fraction exacerbation, POA, active - Patient does not appear to be in exacerbation however has bilateral pleural effusions - Patient does not appear overtly volume overloaded however responded well to IV Lasix - Prior echo showing 45%  Plan: Repeat echocardiogram Redose IV Lasix Check labs in morning  HTN- continue coreg, hold benicar  HLD- continue repatha at DC   CAD- continue ASA  Admission status: Inpatient Med-Surg  Certification: The appropriate patient status for this patient is INPATIENT. Inpatient status is judged to be reasonable and necessary in order to provide the required intensity of service to ensure the patient's safety. The patient's presenting symptoms, physical exam findings, and initial radiographic and laboratory data in the context of their chronic comorbidities is felt to place them at high risk for further clinical deterioration. Furthermore, it is not anticipated that the patient will be  medically stable for discharge from the hospital  within 2 midnights of admission.   * I certify that at the point of admission it is my clinical judgment that the patient will require inpatient hospital care spanning beyond 2 midnights from the point of admission due to high intensity of service, high risk for further deterioration and high frequency of surveillance required.Alan Mulder MD [facility] If 7PM-7AM, please contact night-coverage [email or web address]  08/21/2022, 11:13 PM

## 2022-08-21 NOTE — ED Notes (Signed)
Pt has urinal at bedside. Both pt and wife aware of need for urine sample. Wife has offered assistance if pt needs it.

## 2022-08-21 NOTE — ED Triage Notes (Signed)
Last known well last night 7pm. Today awoke, wife had to assist him with ADL's and that is no his normal that was about 0800. She states his balance has been off for 2 days. Pt just returned from [place] (fly). Pt fell on Tuesday from a wheelchair, while trying to get out, he fell ona box, did not hit the ground or his head (perhaps start of off balance). Seems confused in triage and wife agrees.

## 2022-08-21 NOTE — ED Provider Notes (Signed)
I provided a substantive portion of the care of this patient.  I personally made/approved the management plan for this patient and take responsibility for the patient management.  EKG Interpretation  Date/Time:  Saturday August 21 2022 13:55:42 EDT Ventricular Rate:  103 PR Interval:  149 QRS Duration: 88 QT Interval:  348 QTC Calculation: 456 R Axis:   2 Text Interpretation: Sinus tachycardia Low voltage, extremity leads Nonspecific T abnormalities, lateral leads Confirmed by Lorre Nick ([id number]) on 08/21/2022 2:26:46 PM     Patient presented with shortness of breath and some confusion.  He is not to be febrile here.  Suspect that he has pneumonia.  Patient to have chest x-ray and workup here.  Will likely require admission.   Lorre Nick, MD 08/21/22 1430

## 2022-08-21 NOTE — ED Notes (Signed)
Patient transported to CT with this RN 

## 2022-08-22 ENCOUNTER — Inpatient Hospital Stay (HOSPITAL_COMMUNITY): Payer: Medicare HMO

## 2022-08-22 DIAGNOSIS — G9341 Metabolic encephalopathy: Secondary | ICD-10-CM | POA: Insufficient documentation

## 2022-08-22 DIAGNOSIS — I2489 Other forms of acute ischemic heart disease: Secondary | ICD-10-CM | POA: Insufficient documentation

## 2022-08-22 DIAGNOSIS — I5033 Acute on chronic diastolic (congestive) heart failure: Secondary | ICD-10-CM | POA: Insufficient documentation

## 2022-08-22 LAB — BASIC METABOLIC PANEL
Anion gap: 15 (ref 5–15)
BUN: 12 mg/dL (ref 8–23)
CO2: 22 mmol/L (ref 22–32)
Calcium: 8.2 mg/dL — ABNORMAL LOW (ref 8.9–10.3)
Chloride: 99 mmol/L (ref 98–111)
Creatinine, Ser: 0.78 mg/dL (ref 0.61–1.24)
GFR, Estimated: >60 mL/min (ref >60)
Glucose, Bld: 140 mg/dL — ABNORMAL HIGH (ref 70–99)
Potassium: 3.4 mmol/L — ABNORMAL LOW (ref 3.5–5.1)
Sodium: 136 mmol/L (ref 135–145)

## 2022-08-22 LAB — GLUCOSE, CAPILLARY
Glucose-Capillary: 146 mg/dL — ABNORMAL HIGH (ref 70–99)
Glucose-Capillary: 193 mg/dL — ABNORMAL HIGH (ref 70–99)
Glucose-Capillary: 197 mg/dL — ABNORMAL HIGH (ref 70–99)
Glucose-Capillary: 266 mg/dL — ABNORMAL HIGH (ref 70–99)

## 2022-08-22 LAB — CULTURE, BLOOD (ROUTINE X 2)
Culture: NO GROWTH
Special Requests: ADEQUATE

## 2022-08-22 LAB — MAGNESIUM: Magnesium: 1.8 mg/dL (ref 1.7–2.4)

## 2022-08-22 LAB — BRAIN NATRIURETIC PEPTIDE: B Natriuretic Peptide: 1034.1 pg/mL — ABNORMAL HIGH (ref 0.0–100.0)

## 2022-08-22 MED ORDER — POTASSIUM CHLORIDE CRYS ER 20 MEQ PO TBCR
40.0000 meq | EXTENDED_RELEASE_TABLET | Freq: Once | ORAL | Status: AC
  Administered 2022-08-22: 40 meq via ORAL
  Filled 2022-08-22: qty 2

## 2022-08-22 MED ORDER — FUROSEMIDE 10 MG/ML IJ SOLN
40.0000 mg | Freq: Every day | INTRAMUSCULAR | Status: DC
  Administered 2022-08-22 – 2022-08-24 (×3): 40 mg via INTRAVENOUS
  Filled 2022-08-22 (×3): qty 4

## 2022-08-22 MED ORDER — INSULIN ASPART 100 UNIT/ML IJ SOLN
0.0000 [IU] | Freq: Three times a day (TID) | INTRAMUSCULAR | Status: DC
  Administered 2022-08-22: 1 [IU] via SUBCUTANEOUS
  Administered 2022-08-22 (×2): 2 [IU] via SUBCUTANEOUS
  Administered 2022-08-23: 3 [IU] via SUBCUTANEOUS
  Administered 2022-08-23: 2 [IU] via SUBCUTANEOUS
  Administered 2022-08-23: 7 [IU] via SUBCUTANEOUS
  Administered 2022-08-24: 5 [IU] via SUBCUTANEOUS
  Administered 2022-08-24: 3 [IU] via SUBCUTANEOUS
  Administered 2022-08-24: 2 [IU] via SUBCUTANEOUS
  Administered 2022-08-25: 5 [IU] via SUBCUTANEOUS
  Administered 2022-08-25: 2 [IU] via SUBCUTANEOUS
  Administered 2022-08-25: 1 [IU] via SUBCUTANEOUS
  Administered 2022-08-26: 2 [IU] via SUBCUTANEOUS
  Administered 2022-08-26: 1 [IU] via SUBCUTANEOUS
  Administered 2022-08-26: 2 [IU] via SUBCUTANEOUS
  Administered 2022-08-27 (×2): 3 [IU] via SUBCUTANEOUS
  Administered 2022-08-27 – 2022-08-28 (×2): 2 [IU] via SUBCUTANEOUS
  Administered 2022-08-28: 5 [IU] via SUBCUTANEOUS
  Administered 2022-08-28: 1 [IU] via SUBCUTANEOUS
  Administered 2022-08-29: 2 [IU] via SUBCUTANEOUS
  Administered 2022-08-29: 5 [IU] via SUBCUTANEOUS

## 2022-08-22 MED ORDER — SODIUM CHLORIDE 0.9 % IV SOLN
1.0000 g | INTRAVENOUS | Status: DC
  Administered 2022-08-22 – 2022-08-25 (×4): 1 g via INTRAVENOUS
  Filled 2022-08-22 (×5): qty 10

## 2022-08-22 MED ORDER — IRBESARTAN 150 MG PO TABS
150.0000 mg | ORAL_TABLET | Freq: Every day | ORAL | Status: DC
  Administered 2022-08-22 – 2022-08-24 (×3): 150 mg via ORAL
  Filled 2022-08-22 (×3): qty 1

## 2022-08-22 MED ORDER — TRAMADOL HCL 50 MG PO TABS
50.0000 mg | ORAL_TABLET | Freq: Four times a day (QID) | ORAL | Status: DC | PRN
  Administered 2022-08-23 – 2022-08-24 (×2): 50 mg via ORAL
  Filled 2022-08-22 (×2): qty 1

## 2022-08-22 MED ORDER — INSULIN GLARGINE-YFGN 100 UNIT/ML ~~LOC~~ SOLN
10.0000 [IU] | Freq: Every day | SUBCUTANEOUS | Status: DC
  Administered 2022-08-22: 10 [IU] via SUBCUTANEOUS
  Filled 2022-08-22 (×2): qty 0.1

## 2022-08-22 NOTE — Progress Notes (Addendum)
PROGRESS NOTE    Alan Wang  UJW:[id number] DOB: December 11, 1950 DOA: 08/21/2022 PCP: Henrine Screws, MD     Brief Narrative:  Alan Wang is a 72 y.o. male with medical history significant of CAD, type 2 diabetes, hyperlipidemia, hypertension, prior TIA who presents emergency department with altered mental status.  Patient reports that he had a trip in [place] and has been having symptoms for the last 3 weeks.  He has been having worsening confusion.  His wife reported that he was acting strangely and brought him to the ER for further assessment.  On arrival he was afebrile hemodynamically stable.  Labs were pain which notable for lactic acid 1.2, sodium 132, creatinine 0.69, troponin 57, 58.  Respiratory viral panel was positive for COVID.  CMP was unrevealing.  Patient was admitted due to possible COVID encephalopathy.  Chest x-ray showed cardiomegaly.  CT head showed no acute findings.  CT pulmonary embolism study demonstrated no evidence of PE with bilateral pleural effusions suspicious for interstitial edema.   In the ER patient was started on ceftriaxone and azithromycin due to concern for underlying pneumonia.  He was given 40 mg of IV Lasix.   New events last 24 hours / Subjective: Patient was alert and oriented to place, but not to situation or year.  Toward end of our assessment, he was able to tell me correctly that today was Father's Day.  He had no physical complaints.  He he was able to tell me his wife's name.  Had fever yesterday afternoon 101.2  Assessment & Plan:   Principal Problem:   Acute metabolic encephalopathy Active Problems:   HTN (hypertension)   Mixed hyperlipidemia   Type 2 diabetes mellitus without complication (HCC)   COVID-19   Acute on chronic diastolic CHF (congestive heart failure) (HCC)   Demand ischemia   Acute metabolic encephalopathy secondary to COVID -MRI brain pending  COVID-19 -Currently stable on room air -Having symptoms over 3  weeks -Rocephin in setting of continued cough, fever   Acute on chronic diastolic heart failure -BNP 1034.1 -CTA chest negative for PE, moderate bilateral pleural effusion and compressive atelectasis -Echocardiogram pending -Lasix  Diabetes -Semglee, sliding scale insulin  Demand ischemia in setting of above -Troponin trend remains flat 57, 58  Hypertension -Coreg, Avapro  Hypokalemia -Replace   DVT prophylaxis:  enoxaparin (LOVENOX) injection 40 mg Start: 08/21/22 2200 SCDs Start: 08/21/22 2047  Code Status: Full code Family Communication: Updated wife over the phone  Disposition Plan: Home Status is: Inpatient Remains inpatient appropriate because: IV Lasix, echocardiogram and MRI pending    Antimicrobials:  Anti-infectives (From admission, onward)    Start     Dose/Rate Route Frequency Ordered Stop   08/21/22 1445  cefTRIAXone (ROCEPHIN) 2 g in sodium chloride 0.9 % 100 mL IVPB  Status:  Discontinued        2 g 200 mL/hr over 30 Minutes Intravenous Every 24 hours 08/21/22 1430 08/21/22 2332   08/21/22 1445  azithromycin (ZITHROMAX) 500 mg in sodium chloride 0.9 % 250 mL IVPB  Status:  Discontinued        500 mg 250 mL/hr over 60 Minutes Intravenous Every 24 hours 08/21/22 1430 08/21/22 2332        Objective: Vitals:   08/21/22 2030 08/22/22 0013 08/22/22 0356 08/22/22 0832  BP: (!) 161/86 (!) 170/90 (!) 144/97 (!) 174/104  Pulse: 97 (!) 105 97 (!) 105  Resp: 20 20 20 19   Temp: 99.9 F (37.7 C)  100 F (37.8 C) 98.9 F (37.2 C) 99.2 F (37.3 C)  TempSrc: Oral Oral Oral Oral  SpO2: 97% 98% 97% 98%  Weight:      Height:        Intake/Output Summary (Last 24 hours) at 08/22/2022 1209 Last data filed at 08/22/2022 1154 Gross per 24 hour  Intake 475 ml  Output 5500 ml  Net -5025 ml   Filed Weights   08/21/22 1622  Weight: 92.5 kg    Examination:  General exam: Appears calm and comfortable  Respiratory system: Respiratory effort normal. No  respiratory distress. No conversational dyspnea. On room air  Cardiovascular system: S1 & S2 heard, RRR. No murmurs. Minimal pedal edema. Gastrointestinal system: Abdomen is nondistended, soft and nontender. Normal bowel sounds heard. Central nervous system: Alert and oriented to self and place only, somewhat confused  Extremities: Symmetric in appearance  Skin: No rashes, lesions or ulcers on exposed skin   Data Reviewed: I have personally reviewed following labs and imaging studies  CBC: Recent Labs  Lab 08/21/22 1426 08/21/22 1451 08/21/22 2107  WBC 9.7  --  9.8  NEUTROABS 6.8  --   --   HGB 15.4 15.6 15.0  HCT 47.6 46.0 46.7  MCV 87.8  --  90.2  PLT 192  --  170   Basic Metabolic Panel: Recent Labs  Lab 08/21/22 1426 08/21/22 1451 08/21/22 2107 08/22/22 0549  NA 136 138  --  136  K 3.7 3.8  --  3.4*  CL 101  --   --  99  CO2 21*  --   --  22  GLUCOSE 126*  --   --  140*  BUN 13  --   --  12  CREATININE 0.69  --  0.80 0.78  CALCIUM 9.0  --   --  8.2*  MG  --   --   --  1.8   GFR: Estimated Creatinine Clearance: 94 mL/min (by C-G formula based on SCr of 0.78 mg/dL). Liver Function Tests: Recent Labs  Lab 08/21/22 1426  AST 15  ALT 11  ALKPHOS 72  BILITOT 0.9  PROT 7.0  ALBUMIN 4.2   No results for input(s): "LIPASE", "AMYLASE" in the last 168 hours. No results for input(s): "AMMONIA" in the last 168 hours. Coagulation Profile: Recent Labs  Lab 08/21/22 1430  INR 1.1   Cardiac Enzymes: No results for input(s): "CKTOTAL", "CKMB", "CKMBINDEX", "TROPONINI" in the last 168 hours. BNP (last 3 results) No results for input(s): "PROBNP" in the last 8760 hours. HbA1C: No results for input(s): "HGBA1C" in the last 72 hours. CBG: Recent Labs  Lab 08/21/22 1342 08/22/22 0907  GLUCAP 123* 146*   Lipid Profile: No results for input(s): "CHOL", "HDL", "LDLCALC", "TRIG", "CHOLHDL", "LDLDIRECT" in the last 72 hours. Thyroid Function Tests: No results for  input(s): "TSH", "T4TOTAL", "FREET4", "T3FREE", "THYROIDAB" in the last 72 hours. Anemia Panel: No results for input(s): "VITAMINB12", "FOLATE", "FERRITIN", "TIBC", "IRON", "RETICCTPCT" in the last 72 hours. Sepsis Labs: Recent Labs  Lab 08/21/22 1408 08/21/22 2107  LATICACIDVEN 1.2 1.2    Recent Results (from the past 240 hour(s))  Resp panel by RT-PCR (RSV, Flu A&B, Covid) Anterior Nasal Swab     Status: Abnormal   Collection Time: 08/21/22  2:30 PM   Specimen: Anterior Nasal Swab  Result Value Ref Range Status   SARS Coronavirus 2 by RT PCR POSITIVE (A) NEGATIVE Final    Comment: (NOTE) SARS-CoV-2 target nucleic acids are  DETECTED.  The SARS-CoV-2 RNA is generally detectable in upper respiratory specimens during the acute phase of infection. Positive results are indicative of the presence of the identified virus, but do not rule out bacterial infection or co-infection with other pathogens not detected by the test. Clinical correlation with patient history and other diagnostic information is necessary to determine patient infection status. The expected result is Negative.  Fact Sheet for Patients: BloggerCourse.com  Fact Sheet for Healthcare Providers: SeriousBroker.it  This test is not yet approved or cleared by the [place] FDA and  has been authorized for detection and/or diagnosis of SARS-CoV-2 by FDA under an Emergency Use Authorization (EUA).  This EUA will remain in effect (meaning this test can be used) for the duration of  the COVID-19 declaration under Section 564(b)(1) of the A ct, 21 U.S.C. section 360bbb-3(b)(1), unless the authorization is terminated or revoked sooner.     Influenza A by PCR NEGATIVE NEGATIVE Final   Influenza B by PCR NEGATIVE NEGATIVE Final    Comment: (NOTE) The Xpert Xpress SARS-CoV-2/FLU/RSV plus assay is intended as an aid in the diagnosis of influenza from Nasopharyngeal swab  specimens and should not be used as a sole basis for treatment. Nasal washings and aspirates are unacceptable for Xpert Xpress SARS-CoV-2/FLU/RSV testing.  Fact Sheet for Patients: BloggerCourse.com  Fact Sheet for Healthcare Providers: SeriousBroker.it  This test is not yet approved or cleared by the [place] FDA and has been authorized for detection and/or diagnosis of SARS-CoV-2 by FDA under an Emergency Use Authorization (EUA). This EUA will remain in effect (meaning this test can be used) for the duration of the COVID-19 declaration under Section 564(b)(1) of the Act, 21 U.S.C. section 360bbb-3(b)(1), unless the authorization is terminated or revoked.     Resp Syncytial Virus by PCR NEGATIVE NEGATIVE Final    Comment: (NOTE) Fact Sheet for Patients: BloggerCourse.com  Fact Sheet for Healthcare Providers: SeriousBroker.it  This test is not yet approved or cleared by the [place] FDA and has been authorized for detection and/or diagnosis of SARS-CoV-2 by FDA under an Emergency Use Authorization (EUA). This EUA will remain in effect (meaning this test can be used) for the duration of the COVID-19 declaration under Section 564(b)(1) of the Act, 21 U.S.C. section 360bbb-3(b)(1), unless the authorization is terminated or revoked.  Performed at [facility], [address] [place], [place] [address]   Blood Culture (routine x 2)     Status: None (Preliminary result)   Collection Time: 08/21/22  2:56 PM   Specimen: BLOOD  Result Value Ref Range Status   Specimen Description   Final    BLOOD BLOOD RIGHT HAND Performed at [facility], [address] [place], [place] [address]    Special Requests   Final    BOTTLES DRAWN AEROBIC AND ANAEROBIC Blood Culture adequate volume Performed at [facility], [address] [place], [place] [address]    Culture   Final    NO GROWTH < 12 HOURS Performed at [facility], [address] [place], [place] [address]    Report Status PENDING  Incomplete  Blood Culture (routine x 2)     Status: None (Preliminary result)   Collection Time: 08/21/22  2:58 PM   Specimen: BLOOD  Result Value Ref Range Status   Specimen Description   Final    BLOOD BLOOD RIGHT ARM Performed at [facility], [address] [place], [place] [address]  Special Requests   Final    BOTTLES DRAWN AEROBIC AND ANAEROBIC Blood Culture adequate volume Performed at [facility], [address] [place], [place] [address]    Culture   Final    NO GROWTH < 12 HOURS Performed at [facility], [address] [place], [place] [address]    Report Status PENDING  Incomplete      Radiology Studies: CT Head Wo Contrast  Result Date: 08/21/2022 CLINICAL DATA:  Weakness, decreased balance. EXAM: CT HEAD WITHOUT CONTRAST TECHNIQUE: Contiguous axial images were obtained from the base of the skull through the vertex without intravenous contrast. RADIATION DOSE REDUCTION: This exam was performed according to the departmental dose-optimization program which includes automated exposure control, adjustment of the mA and/or kV according to patient size and/or use of iterative reconstruction technique. COMPARISON:  CT head dated 06/22/2016. FINDINGS: Motion artifact limits the sensitivity of the exam. Brain: No evidence of acute infarction, hemorrhage, hydrocephalus, extra-axial collection or mass lesion/mass effect. There is mild cerebral volume loss with associated ex vacuo dilatation. Periventricular white matter hypoattenuation likely represents chronic small vessel ischemic disease. Vascular: There are vascular calcifications in the carotid siphons. Skull: Normal. Negative for fracture or focal lesion. Sinuses/Orbits: There is bilateral  ethmoid sinus disease. Other: None. IMPRESSION: 1. No acute intracranial pathology. 2. Bilateral ethmoid sinus disease. Electronically Signed   By: Romona Curls M.D.   On: 08/21/2022 16:20   CT Angio Chest PE W/Cm &/Or Wo Cm  Result Date: 08/21/2022 CLINICAL DATA:  Acute shortness of breath beginning this morning. Recent long plane flight. Clinical suspicion for pulmonary embolism. EXAM: CT ANGIOGRAPHY CHEST WITH CONTRAST TECHNIQUE: Multidetector CT imaging of the chest was performed using the standard protocol during bolus administration of intravenous contrast. Multiplanar CT image reconstructions and MIPs were obtained to evaluate the vascular anatomy. RADIATION DOSE REDUCTION: This exam was performed according to the departmental dose-optimization program which includes automated exposure control, adjustment of the mA and/or kV according to patient size and/or use of iterative reconstruction technique. CONTRAST:  75mL OMNIPAQUE IOHEXOL 350 MG/ML SOLN COMPARISON:  None Available. FINDINGS: Cardiovascular: Satisfactory opacification of pulmonary arteries noted, and no pulmonary emboli identified. No evidence of thoracic aortic dissection or aneurysm. Mediastinum/Nodes: No masses or pathologically enlarged lymph nodes identified. Lungs/Pleura: Moderate bilateral pleural effusions are seen with compressive atelectasis. Diffuse interstitial prominence is also seen, suspicious for interstitial edema. No evidence of pulmonary consolidation. Upper abdomen: 2.4 cm fat attenuation left adrenal mass, consistent with benign myelolipoma (No followup imaging is recommended). Musculoskeletal: No suspicious bone lesions identified. Review of the MIP images confirms the above findings. IMPRESSION: No evidence of pulmonary embolism. Moderate bilateral pleural effusions and compressive atelectasis. Diffuse interstitial prominence, suspicious for interstitial edema. Electronically Signed   By: Danae Orleans M.D.   On:  08/21/2022 16:20   DG Chest Port 1 View  Result Date: 08/21/2022 CLINICAL DATA:  Shortness of breath EXAM: PORTABLE CHEST 1 VIEW COMPARISON:  11/16/2012 FINDINGS: Transverse diameter of heart is increased. There are no signs of pulmonary edema. There is no focal pulmonary consolidation. Apparent shift of mediastinum to the right may be due to rotation. Low lung volumes. There is no significant pleural effusion or pneumothorax. There is previous arthroplasty and right shoulder. IMPRESSION: Cardiomegaly. There are no signs of pulmonary edema or focal pulmonary consolidation. Low lung volumes limit evaluation of lower lung fields. Electronically Signed   By: Ernie Avena M.D.   On: 08/21/2022 14:59  Scheduled Meds:  aspirin EC  81 mg Oral QHS   carvedilol  6.25 mg Oral BID WC   enoxaparin (LOVENOX) injection  40 mg Subcutaneous Q24H   furosemide  40 mg Intravenous Daily   insulin aspart  0-9 Units Subcutaneous TID WC   insulin glargine-yfgn  10 Units Subcutaneous QHS   irbesartan  150 mg Oral Daily   Continuous Infusions:   LOS: 1 day   Time spent: 35 minutes   Noralee Stain, DO [facility] 08/22/2022, 12:09 PM   Available via Epic secure chat 7am-7pm After these hours, please refer to coverage provider listed on [email or web address]

## 2022-08-22 NOTE — Plan of Care (Signed)
  Problem: Education: Goal: Ability to describe self-care measures that may prevent or decrease complications (Diabetes Survival Skills Education) will improve 08/22/2022 1016 by Marni Griffon I, RN Outcome: Progressing 08/22/2022 1003 by Marni Griffon I, RN Outcome: Progressing   Problem: Coping: Goal: Ability to adjust to condition or change in health will improve Outcome: Progressing   Problem: Nutritional: Goal: Maintenance of adequate nutrition will improve 08/22/2022 1016 by Marni Griffon I, RN Outcome: Progressing 08/22/2022 1003 by Marni Griffon I, RN Outcome: Progressing   Problem: Tissue Perfusion: Goal: Adequacy of tissue perfusion will improve Outcome: Progressing   Problem: Metabolic: Goal: Ability to maintain appropriate glucose levels will improve Outcome: Progressing

## 2022-08-22 NOTE — Plan of Care (Signed)
  Problem: Education: Goal: Knowledge of General Education information will improve Description: Including pain rating scale, medication(s)/side effects and non-pharmacologic comfort measures Outcome: Progressing   Problem: Clinical Measurements: Goal: Respiratory complications will improve Outcome: Progressing   Problem: Coping: Goal: Level of anxiety will decrease Outcome: Progressing   Problem: Pain Managment: Goal: General experience of comfort will improve Outcome: Progressing   Problem: Safety: Goal: Ability to remain free from injury will improve Outcome: Progressing   

## 2022-08-22 NOTE — Plan of Care (Signed)
  Problem: Education: Goal: Ability to describe self-care measures that may prevent or decrease complications (Diabetes Survival Skills Education) will improve Outcome: Progressing   Problem: Nutritional: Goal: Maintenance of adequate nutrition will improve Outcome: Progressing   

## 2022-08-23 ENCOUNTER — Inpatient Hospital Stay (HOSPITAL_COMMUNITY): Payer: Medicare HMO

## 2022-08-23 DIAGNOSIS — R9431 Abnormal electrocardiogram [ECG] [EKG]: Secondary | ICD-10-CM

## 2022-08-23 DIAGNOSIS — G9341 Metabolic encephalopathy: Secondary | ICD-10-CM | POA: Diagnosis not present

## 2022-08-23 LAB — COMPREHENSIVE METABOLIC PANEL
ALT: 15 U/L (ref 0–44)
AST: 20 U/L (ref 15–41)
Albumin: 3.2 g/dL — ABNORMAL LOW (ref 3.5–5.0)
Alkaline Phosphatase: 59 U/L (ref 38–126)
Anion gap: 11 (ref 5–15)
BUN: 19 mg/dL (ref 8–23)
CO2: 23 mmol/L (ref 22–32)
Calcium: 8.6 mg/dL — ABNORMAL LOW (ref 8.9–10.3)
Chloride: 103 mmol/L (ref 98–111)
Creatinine, Ser: 0.75 mg/dL (ref 0.61–1.24)
GFR, Estimated: >60 mL/min (ref >60)
Glucose, Bld: 196 mg/dL — ABNORMAL HIGH (ref 70–99)
Potassium: 3.6 mmol/L (ref 3.5–5.1)
Sodium: 137 mmol/L (ref 135–145)
Total Bilirubin: 0.9 mg/dL (ref 0.3–1.2)
Total Protein: 6.7 g/dL (ref 6.5–8.1)

## 2022-08-23 LAB — CBC
HCT: 50.2 % (ref 39.0–52.0)
Hemoglobin: 16.5 g/dL (ref 13.0–17.0)
MCH: 29.1 pg (ref 26.0–34.0)
MCHC: 32.9 g/dL (ref 30.0–36.0)
MCV: 88.5 fL (ref 80.0–100.0)
Platelets: 180 10*3/uL (ref 150–400)
RBC: 5.67 MIL/uL (ref 4.22–5.81)
RDW: 13.8 % (ref 11.5–15.5)
WBC: 7 10*3/uL (ref 4.0–10.5)
nRBC: 0 % (ref 0.0–0.2)

## 2022-08-23 LAB — GLUCOSE, CAPILLARY
Glucose-Capillary: 194 mg/dL — ABNORMAL HIGH (ref 70–99)
Glucose-Capillary: 331 mg/dL — ABNORMAL HIGH (ref 70–99)
Glucose-Capillary: 205 mg/dL — ABNORMAL HIGH (ref 70–99)
Glucose-Capillary: 217 mg/dL — ABNORMAL HIGH (ref 70–99)

## 2022-08-23 LAB — ECHOCARDIOGRAM COMPLETE
Area-P 1/2: 4.93 cm2
Height: 70 in
MV M vel: 1.88 m/s
MV Peak grad: 14.1 mmHg
S' Lateral: 5.1 cm
Weight: 3192.26 oz

## 2022-08-23 LAB — C-REACTIVE PROTEIN: CRP: 11.1 mg/dL — ABNORMAL HIGH (ref <1.0)

## 2022-08-23 LAB — CULTURE, BLOOD (ROUTINE X 2): Culture: NO GROWTH

## 2022-08-23 MED ORDER — PERFLUTREN LIPID MICROSPHERE
1.0000 mL | INTRAVENOUS | Status: AC | PRN
  Administered 2022-08-23: 2 mL via INTRAVENOUS

## 2022-08-23 MED ORDER — INSULIN ASPART 100 UNIT/ML IJ SOLN
3.0000 [IU] | Freq: Three times a day (TID) | INTRAMUSCULAR | Status: DC
  Administered 2022-08-23 – 2022-08-29 (×18): 3 [IU] via SUBCUTANEOUS

## 2022-08-23 MED ORDER — POTASSIUM CHLORIDE CRYS ER 20 MEQ PO TBCR
40.0000 meq | EXTENDED_RELEASE_TABLET | Freq: Once | ORAL | Status: AC
  Administered 2022-08-23: 40 meq via ORAL
  Filled 2022-08-23: qty 2

## 2022-08-23 MED ORDER — INSULIN GLARGINE-YFGN 100 UNIT/ML ~~LOC~~ SOLN
20.0000 [IU] | Freq: Every day | SUBCUTANEOUS | Status: DC
  Administered 2022-08-23 – 2022-08-28 (×6): 20 [IU] via SUBCUTANEOUS
  Filled 2022-08-23 (×7): qty 0.2

## 2022-08-23 NOTE — Inpatient Diabetes Management (Signed)
Inpatient Diabetes Program Recommendations  AACE/ADA: New Consensus Statement on Inpatient Glycemic Control (2015)  Target Ranges:  Prepandial:   less than 140 mg/dL      Peak postprandial:   less than 180 mg/dL (1-2 hours)      Critically ill patients:  140 - 180 mg/dL   Lab Results  Component Value Date   GLUCAP 194 (H) 08/23/2022   HGBA1C 6.8 (H) 12/29/2021    Review of Glycemic Control  Latest Reference Range & Units 08/22/22 09:07 08/22/22 12:36 08/22/22 16:52 08/22/22 21:45 08/23/22 08:56  Glucose-Capillary 70 - 99 mg/dL 956 (H) 213 (H) 086 (H) 266 (H) 194 (H)   Diabetes history: DM 2 Outpatient Diabetes medications:  NPH 10 units q AM NPH 35 units q supper Regular 20 units with breakfast and Regular 25 units with lunch Metformin 1000 mg bid Current orders for Inpatient glycemic control:  Novolog 0-9 units tid with meals Semglee 10 units q HS  Inpatient Diabetes Program Recommendations:    Please increase Semglee to 20 units q HS and add Novolog 3 units tid with meals (Hold if patient eats less than 50% or NPO).   Thanks,  Beryl Meager, RN, BC-ADM Inpatient Diabetes Coordinator Pager [phone]  (8a-5p)

## 2022-08-23 NOTE — TOC CM/SW Note (Signed)
Transition of Care [facility]) - Inpatient Brief Assessment   Patient Details  Name: Alan Wang MRN: [id number] Date of Birth: 11-08-1950  Transition of Care [facility]) CM/SW Contact:    Otelia Santee, LCSW Phone Number: 08/23/2022, 1:18 PM   Clinical Narrative: Chart reviewed. No TOC needs identified. Please consult TOC should need arise.   Transition of Care Asessment: Insurance and Status: Insurance coverage has been reviewed Patient has primary care physician: Yes Home environment has been reviewed: Home with spouse Prior level of function:: Independent Prior/Current Home Services: No current home services Social Determinants of Health Reivew: SDOH reviewed no interventions necessary Readmission risk has been reviewed: Yes Transition of care needs: no transition of care needs at this time

## 2022-08-23 NOTE — Progress Notes (Signed)
  Echocardiogram 2D Echocardiogram has been performed.  Milda Smart 08/23/2022, 8:50 AM

## 2022-08-23 NOTE — Progress Notes (Signed)
PROGRESS NOTE    Alan Wang  WNU:[id number] DOB: Aug 07, 1950 DOA: 08/21/2022 PCP: Henrine Screws, MD     Brief Narrative:  Alan Wang is a 72 y.o. male with medical history significant of CAD, type 2 diabetes, hyperlipidemia, hypertension, prior TIA who presents emergency department with altered mental status.  Patient reports that he had a trip in [place] and has been having symptoms for the last 3 weeks.  He has been having worsening confusion.  His wife reported that he was acting strangely and brought him to the ER for further assessment.  On arrival he was afebrile hemodynamically stable.  Labs were pain which notable for lactic acid 1.2, sodium 132, creatinine 0.69, troponin 57, 58.  Respiratory viral panel was positive for COVID.  CMP was unrevealing.  Patient was admitted due to possible COVID encephalopathy.  Chest x-ray showed cardiomegaly.  CT head showed no acute findings.  CT pulmonary embolism study demonstrated no evidence of PE with bilateral pleural effusions suspicious for interstitial edema.   In the ER patient was started on ceftriaxone and azithromycin due to concern for underlying pneumonia.  He was given 40 mg of IV Lasix.   New events last 24 hours / Subjective: Patient was alert and oriented to place and month, but not to year.  He states that he is retired, does not keep track of the year.  Has no complaints today.  Urine output recorded 2650 mL last 24 hours.  Spoke with wife over the phone, she is now feeling ill as well with likely COVID.  Assessment & Plan:   Principal Problem:   Acute metabolic encephalopathy Active Problems:   HTN (hypertension)   Mixed hyperlipidemia   Type 2 diabetes mellitus without complication (HCC)   COVID-19   Acute on chronic diastolic CHF (congestive heart failure) (HCC)   Demand ischemia   Acute metabolic encephalopathy secondary to COVID -MRI brain without any acute abnormalities -Improving  COVID-19 -Currently stable  on room air -Having symptoms over 3 weeks -Rocephin in setting of continued cough, fever  -CRP elevated 11.1, trend  Acute on chronic diastolic heart failure -BNP 1034.1 -CTA chest negative for PE, moderate bilateral pleural effusion and compressive atelectasis -Echocardiogram pending -Lasix, Coreg  Diabetes -Semglee, sliding scale insulin -dose adjusted today  Demand ischemia in setting of above -Troponin trend remains flat 57, 58  Hypertension -Coreg, Avapro  Hypokalemia -Replace   DVT prophylaxis:  enoxaparin (LOVENOX) injection 40 mg Start: 08/21/22 2200 SCDs Start: 08/21/22 2047  Code Status: Full code Family Communication: Updated wife over the phone today Disposition Plan: Home Status is: Inpatient Remains inpatient appropriate because: IV Lasix, MRI pending    Antimicrobials:  Anti-infectives (From admission, onward)    Start     Dose/Rate Route Frequency Ordered Stop   08/22/22 1400  cefTRIAXone (ROCEPHIN) 1 g in sodium chloride 0.9 % 100 mL IVPB        1 g 200 mL/hr over 30 Minutes Intravenous Every 24 hours 08/22/22 1254     08/21/22 1445  cefTRIAXone (ROCEPHIN) 2 g in sodium chloride 0.9 % 100 mL IVPB  Status:  Discontinued        2 g 200 mL/hr over 30 Minutes Intravenous Every 24 hours 08/21/22 1430 08/21/22 2332   08/21/22 1445  azithromycin (ZITHROMAX) 500 mg in sodium chloride 0.9 % 250 mL IVPB  Status:  Discontinued        500 mg 250 mL/hr over 60 Minutes Intravenous Every 24 hours  08/21/22 1430 08/21/22 2332        Objective: Vitals:   08/22/22 1411 08/22/22 2116 08/23/22 0500 08/23/22 0540  BP: (!) 141/93 (!) 162/91  (!) 152/94  Pulse: (!) 103 (!) 102  95  Resp: 20 19  15   Temp: 100 F (37.8 C) 99 F (37.2 C)  99 F (37.2 C)  TempSrc: Oral Oral  Oral  SpO2: 95% 97%  96%  Weight:   90.5 kg   Height:        Intake/Output Summary (Last 24 hours) at 08/23/2022 1220 Last data filed at 08/23/2022 0930 Gross per 24 hour  Intake 1300  ml  Output 850 ml  Net 450 ml    Filed Weights   08/21/22 1622 08/23/22 0500  Weight: 92.5 kg 90.5 kg    Examination:  General exam: Appears calm and comfortable  Respiratory system: Respiratory effort normal. No respiratory distress. No conversational dyspnea. On room air  Cardiovascular system: S1 & S2 heard, RRR. No murmurs. Minimal pedal edema. Gastrointestinal system: Abdomen is nondistended, soft and nontender. Normal bowel sounds heard. Central nervous system: Alert and oriented to self and place only, somewhat confused  Extremities: Symmetric in appearance  Skin: No rashes, lesions or ulcers on exposed skin   Data Reviewed: I have personally reviewed following labs and imaging studies  CBC: Recent Labs  Lab 08/21/22 1426 08/21/22 1451 08/21/22 2107 08/23/22 0505  WBC 9.7  --  9.8 7.0  NEUTROABS 6.8  --   --   --   HGB 15.4 15.6 15.0 16.5  HCT 47.6 46.0 46.7 50.2  MCV 87.8  --  90.2 88.5  PLT 192  --  170 180    Basic Metabolic Panel: Recent Labs  Lab 08/21/22 1426 08/21/22 1451 08/21/22 2107 08/22/22 0549 08/23/22 0505  NA 136 138  --  136 137  K 3.7 3.8  --  3.4* 3.6  CL 101  --   --  99 103  CO2 21*  --   --  22 23  GLUCOSE 126*  --   --  140* 196*  BUN 13  --   --  12 19  CREATININE 0.69  --  0.80 0.78 0.75  CALCIUM 9.0  --   --  8.2* 8.6*  MG  --   --   --  1.8  --     GFR: Estimated Creatinine Clearance: 93.1 mL/min (by C-G formula based on SCr of 0.75 mg/dL). Liver Function Tests: Recent Labs  Lab 08/21/22 1426 08/23/22 0505  AST 15 20  ALT 11 15  ALKPHOS 72 59  BILITOT 0.9 0.9  PROT 7.0 6.7  ALBUMIN 4.2 3.2*    No results for input(s): "LIPASE", "AMYLASE" in the last 168 hours. No results for input(s): "AMMONIA" in the last 168 hours. Coagulation Profile: Recent Labs  Lab 08/21/22 1430  INR 1.1    Cardiac Enzymes: No results for input(s): "CKTOTAL", "CKMB", "CKMBINDEX", "TROPONINI" in the last 168 hours. BNP (last 3  results) No results for input(s): "PROBNP" in the last 8760 hours. HbA1C: No results for input(s): "HGBA1C" in the last 72 hours. CBG: Recent Labs  Lab 08/22/22 1236 08/22/22 1652 08/22/22 2145 08/23/22 0856 08/23/22 1148  GLUCAP 193* 197* 266* 194* 331*    Lipid Profile: No results for input(s): "CHOL", "HDL", "LDLCALC", "TRIG", "CHOLHDL", "LDLDIRECT" in the last 72 hours. Thyroid Function Tests: No results for input(s): "TSH", "T4TOTAL", "FREET4", "T3FREE", "THYROIDAB" in the last 72 hours. Anemia  Panel: No results for input(s): "VITAMINB12", "FOLATE", "FERRITIN", "TIBC", "IRON", "RETICCTPCT" in the last 72 hours. Sepsis Labs: Recent Labs  Lab 08/21/22 1408 08/21/22 2107  LATICACIDVEN 1.2 1.2     Recent Results (from the past 240 hour(s))  Resp panel by RT-PCR (RSV, Flu A&B, Covid) Anterior Nasal Swab     Status: Abnormal   Collection Time: 08/21/22  2:30 PM   Specimen: Anterior Nasal Swab  Result Value Ref Range Status   SARS Coronavirus 2 by RT PCR POSITIVE (A) NEGATIVE Final    Comment: (NOTE) SARS-CoV-2 target nucleic acids are DETECTED.  The SARS-CoV-2 RNA is generally detectable in upper respiratory specimens during the acute phase of infection. Positive results are indicative of the presence of the identified virus, but do not rule out bacterial infection or co-infection with other pathogens not detected by the test. Clinical correlation with patient history and other diagnostic information is necessary to determine patient infection status. The expected result is Negative.  Fact Sheet for Patients: BloggerCourse.com  Fact Sheet for Healthcare Providers: SeriousBroker.it  This test is not yet approved or cleared by the [place] FDA and  has been authorized for detection and/or diagnosis of SARS-CoV-2 by FDA under an Emergency Use Authorization (EUA).  This EUA will remain in effect (meaning this  test can be used) for the duration of  the COVID-19 declaration under Section 564(b)(1) of the A ct, 21 U.S.C. section 360bbb-3(b)(1), unless the authorization is terminated or revoked sooner.     Influenza A by PCR NEGATIVE NEGATIVE Final   Influenza B by PCR NEGATIVE NEGATIVE Final    Comment: (NOTE) The Xpert Xpress SARS-CoV-2/FLU/RSV plus assay is intended as an aid in the diagnosis of influenza from Nasopharyngeal swab specimens and should not be used as a sole basis for treatment. Nasal washings and aspirates are unacceptable for Xpert Xpress SARS-CoV-2/FLU/RSV testing.  Fact Sheet for Patients: BloggerCourse.com  Fact Sheet for Healthcare Providers: SeriousBroker.it  This test is not yet approved or cleared by the [place] FDA and has been authorized for detection and/or diagnosis of SARS-CoV-2 by FDA under an Emergency Use Authorization (EUA). This EUA will remain in effect (meaning this test can be used) for the duration of the COVID-19 declaration under Section 564(b)(1) of the Act, 21 U.S.C. section 360bbb-3(b)(1), unless the authorization is terminated or revoked.     Resp Syncytial Virus by PCR NEGATIVE NEGATIVE Final    Comment: (NOTE) Fact Sheet for Patients: BloggerCourse.com  Fact Sheet for Healthcare Providers: SeriousBroker.it  This test is not yet approved or cleared by the [place] FDA and has been authorized for detection and/or diagnosis of SARS-CoV-2 by FDA under an Emergency Use Authorization (EUA). This EUA will remain in effect (meaning this test can be used) for the duration of the COVID-19 declaration under Section 564(b)(1) of the Act, 21 U.S.C. section 360bbb-3(b)(1), unless the authorization is terminated or revoked.  Performed at [facility], [address] [place], [place] [address]   Blood Culture (routine  x 2)     Status: None (Preliminary result)   Collection Time: 08/21/22  2:56 PM   Specimen: BLOOD  Result Value Ref Range Status   Specimen Description   Final    BLOOD BLOOD RIGHT HAND Performed at [facility], [address] [place], [place] [address]    Special Requests   Final    BOTTLES DRAWN AEROBIC AND ANAEROBIC Blood Culture adequate volume Performed at [facility], [phone]  [address], [place], [place] [address]    Culture   Final    NO GROWTH 2 DAYS Performed at [facility], [address], [place], [place] [address]    Report Status PENDING  Incomplete  Blood Culture (routine x 2)     Status: None (Preliminary result)   Collection Time: 08/21/22  2:58 PM   Specimen: BLOOD  Result Value Ref Range Status   Specimen Description   Final    BLOOD BLOOD RIGHT ARM Performed at [facility], [address], [place], [place] [address]    Special Requests   Final    BOTTLES DRAWN AEROBIC AND ANAEROBIC Blood Culture adequate volume Performed at [facility], [address] [place], [place] [address]    Culture   Final    NO GROWTH 2 DAYS Performed at [facility], [address], [place], [place] [address]    Report Status PENDING  Incomplete      Radiology Studies: MR BRAIN WO CONTRAST  Result Date: 08/22/2022 CLINICAL DATA:  Altered mental status. EXAM: MRI HEAD WITHOUT CONTRAST TECHNIQUE: Multiplanar, multiecho pulse sequences of the brain and surrounding structures were obtained without intravenous contrast. COMPARISON:  CT head without contrast 08/21/2022. MR head 06/22/2016 FINDINGS: Brain: No acute infarct, hemorrhage, or mass lesion is present. Moderate generalized atrophy and white matter disease bilaterally is similar to the prior exam. The ventricles are proportionate to the degree of atrophy. Dilated perivascular spaces are present within the basal ganglia. A remote right  paramedian pontine infarct is present. Remote lacunar infarcts are present in the left cerebellum. No significant extraaxial fluid collection is present. The internal auditory canals are within normal limits. Vascular: Flow is present in the major intracranial arteries. Skull and upper cervical spine: Mild degenerative changes are present in the upper cervical spine. Craniocervical junction is normal. Marrow signal is normal. Sinuses/Orbits: Mild mucosal thickening is present throughout the ethmoid air cells and right frontal sinus. A fluid level is present in the right maxillary sinus. Mastoid air cells are clear. Bilateral lens replacements are noted. Globes and orbits are otherwise unremarkable. Other: IMPRESSION: 1. No acute intracranial abnormality or significant interval change. 2. Moderate generalized atrophy and white matter disease likely reflects the sequela of chronic microvascular ischemia. 3. Remote lacunar infarcts of the right paramedian pons and left cerebellum. 4. Mild sinus disease. Electronically Signed   By: Marin Roberts M.D.   On: 08/22/2022 19:05   CT Head Wo Contrast  Result Date: 08/21/2022 CLINICAL DATA:  Weakness, decreased balance. EXAM: CT HEAD WITHOUT CONTRAST TECHNIQUE: Contiguous axial images were obtained from the base of the skull through the vertex without intravenous contrast. RADIATION DOSE REDUCTION: This exam was performed according to the departmental dose-optimization program which includes automated exposure control, adjustment of the mA and/or kV according to patient size and/or use of iterative reconstruction technique. COMPARISON:  CT head dated 06/22/2016. FINDINGS: Motion artifact limits the sensitivity of the exam. Brain: No evidence of acute infarction, hemorrhage, hydrocephalus, extra-axial collection or mass lesion/mass effect. There is mild cerebral volume loss with associated ex vacuo dilatation. Periventricular white matter hypoattenuation likely  represents chronic small vessel ischemic disease. Vascular: There are vascular calcifications in the carotid siphons. Skull: Normal. Negative for fracture or focal lesion. Sinuses/Orbits: There is bilateral ethmoid sinus disease. Other: None. IMPRESSION: 1. No acute intracranial pathology. 2. Bilateral ethmoid sinus disease. Electronically Signed   By: Romona Curls M.D.   On: 08/21/2022 16:20  CT Angio Chest PE W/Cm &/Or Wo Cm  Result Date: 08/21/2022 CLINICAL DATA:  Acute shortness of breath beginning this morning. Recent long plane flight. Clinical suspicion for pulmonary embolism. EXAM: CT ANGIOGRAPHY CHEST WITH CONTRAST TECHNIQUE: Multidetector CT imaging of the chest was performed using the standard protocol during bolus administration of intravenous contrast. Multiplanar CT image reconstructions and MIPs were obtained to evaluate the vascular anatomy. RADIATION DOSE REDUCTION: This exam was performed according to the departmental dose-optimization program which includes automated exposure control, adjustment of the mA and/or kV according to patient size and/or use of iterative reconstruction technique. CONTRAST:  75mL OMNIPAQUE IOHEXOL 350 MG/ML SOLN COMPARISON:  None Available. FINDINGS: Cardiovascular: Satisfactory opacification of pulmonary arteries noted, and no pulmonary emboli identified. No evidence of thoracic aortic dissection or aneurysm. Mediastinum/Nodes: No masses or pathologically enlarged lymph nodes identified. Lungs/Pleura: Moderate bilateral pleural effusions are seen with compressive atelectasis. Diffuse interstitial prominence is also seen, suspicious for interstitial edema. No evidence of pulmonary consolidation. Upper abdomen: 2.4 cm fat attenuation left adrenal mass, consistent with benign myelolipoma (No followup imaging is recommended). Musculoskeletal: No suspicious bone lesions identified. Review of the MIP images confirms the above findings. IMPRESSION: No evidence of  pulmonary embolism. Moderate bilateral pleural effusions and compressive atelectasis. Diffuse interstitial prominence, suspicious for interstitial edema. Electronically Signed   By: Danae Orleans M.D.   On: 08/21/2022 16:20   DG Chest Port 1 View  Result Date: 08/21/2022 CLINICAL DATA:  Shortness of breath EXAM: PORTABLE CHEST 1 VIEW COMPARISON:  11/16/2012 FINDINGS: Transverse diameter of heart is increased. There are no signs of pulmonary edema. There is no focal pulmonary consolidation. Apparent shift of mediastinum to the right may be due to rotation. Low lung volumes. There is no significant pleural effusion or pneumothorax. There is previous arthroplasty and right shoulder. IMPRESSION: Cardiomegaly. There are no signs of pulmonary edema or focal pulmonary consolidation. Low lung volumes limit evaluation of lower lung fields. Electronically Signed   By: Ernie Avena M.D.   On: 08/21/2022 14:59      Scheduled Meds:  aspirin EC  81 mg Oral QHS   carvedilol  6.25 mg Oral BID WC   enoxaparin (LOVENOX) injection  40 mg Subcutaneous Q24H   furosemide  40 mg Intravenous Daily   insulin aspart  0-9 Units Subcutaneous TID WC   insulin aspart  3 Units Subcutaneous TID WC   insulin glargine-yfgn  20 Units Subcutaneous QHS   irbesartan  150 mg Oral Daily   Continuous Infusions:  cefTRIAXone (ROCEPHIN)  IV 1 g (08/22/22 1524)     LOS: 2 days   Time spent: 25 minutes   Noralee Stain, DO [facility] 08/23/2022, 12:20 PM   Available via Epic secure chat 7am-7pm After these hours, please refer to coverage provider listed on [email or web address]

## 2022-08-24 DIAGNOSIS — G9341 Metabolic encephalopathy: Secondary | ICD-10-CM | POA: Diagnosis not present

## 2022-08-24 LAB — CBC
HCT: 48 % (ref 39.0–52.0)
Hemoglobin: 15.4 g/dL (ref 13.0–17.0)
MCH: 28.3 pg (ref 26.0–34.0)
MCHC: 32.1 g/dL (ref 30.0–36.0)
MCV: 88.1 fL (ref 80.0–100.0)
Platelets: 180 10*3/uL (ref 150–400)
RBC: 5.45 MIL/uL (ref 4.22–5.81)
RDW: 13.7 % (ref 11.5–15.5)
WBC: 6.6 10*3/uL (ref 4.0–10.5)
nRBC: 0 % (ref 0.0–0.2)

## 2022-08-24 LAB — COMPREHENSIVE METABOLIC PANEL
ALT: 16 U/L (ref 0–44)
AST: 21 U/L (ref 15–41)
Albumin: 3.2 g/dL — ABNORMAL LOW (ref 3.5–5.0)
Alkaline Phosphatase: 60 U/L (ref 38–126)
Anion gap: 7 (ref 5–15)
BUN: 20 mg/dL (ref 8–23)
CO2: 26 mmol/L (ref 22–32)
Calcium: 8.6 mg/dL — ABNORMAL LOW (ref 8.9–10.3)
Chloride: 103 mmol/L (ref 98–111)
Creatinine, Ser: 0.51 mg/dL — ABNORMAL LOW (ref 0.61–1.24)
GFR, Estimated: >60 mL/min (ref >60)
Glucose, Bld: 177 mg/dL — ABNORMAL HIGH (ref 70–99)
Potassium: 3.6 mmol/L (ref 3.5–5.1)
Sodium: 136 mmol/L (ref 135–145)
Total Bilirubin: 0.8 mg/dL (ref 0.3–1.2)
Total Protein: 6.4 g/dL — ABNORMAL LOW (ref 6.5–8.1)

## 2022-08-24 LAB — GLUCOSE, CAPILLARY
Glucose-Capillary: 186 mg/dL — ABNORMAL HIGH (ref 70–99)
Glucose-Capillary: 241 mg/dL — ABNORMAL HIGH (ref 70–99)
Glucose-Capillary: 199 mg/dL — ABNORMAL HIGH (ref 70–99)
Glucose-Capillary: 294 mg/dL — ABNORMAL HIGH (ref 70–99)
Glucose-Capillary: 280 mg/dL — ABNORMAL HIGH (ref 70–99)

## 2022-08-24 LAB — MAGNESIUM: Magnesium: 1.9 mg/dL (ref 1.7–2.4)

## 2022-08-24 LAB — C-REACTIVE PROTEIN: CRP: 5.7 mg/dL — ABNORMAL HIGH (ref <1.0)

## 2022-08-24 LAB — CULTURE, BLOOD (ROUTINE X 2)

## 2022-08-24 MED ORDER — EMPAGLIFLOZIN 10 MG PO TABS
10.0000 mg | ORAL_TABLET | Freq: Every day | ORAL | Status: DC
  Administered 2022-08-24 – 2022-08-29 (×6): 10 mg via ORAL
  Filled 2022-08-24 (×6): qty 1

## 2022-08-24 MED ORDER — SACUBITRIL-VALSARTAN 24-26 MG PO TABS
1.0000 | ORAL_TABLET | Freq: Two times a day (BID) | ORAL | Status: DC
  Administered 2022-08-24 – 2022-08-29 (×10): 1 via ORAL
  Filled 2022-08-24 (×10): qty 1

## 2022-08-24 MED ORDER — GUAIFENESIN-DM 100-10 MG/5ML PO SYRP
5.0000 mL | ORAL_SOLUTION | ORAL | Status: DC | PRN
  Administered 2022-08-24 – 2022-08-28 (×3): 5 mL via ORAL
  Filled 2022-08-24 (×3): qty 5

## 2022-08-24 MED ORDER — POTASSIUM CHLORIDE CRYS ER 20 MEQ PO TBCR
40.0000 meq | EXTENDED_RELEASE_TABLET | Freq: Once | ORAL | Status: AC
  Administered 2022-08-24: 40 meq via ORAL
  Filled 2022-08-24: qty 2

## 2022-08-24 NOTE — Progress Notes (Signed)
PROGRESS NOTE    Alan Wang  WUJ:[id number] DOB: Oct 26, 1950 DOA: 08/21/2022 PCP: Henrine Screws, MD     Brief Narrative:  Alan Wang is a 71 y.o. male with medical history significant of CAD, type 2 diabetes, hyperlipidemia, hypertension, prior TIA who presents emergency department with altered mental status.  Patient reports that he had a trip in [place] and has been having symptoms for the last 3 weeks.  He has been having worsening confusion.  His wife reported that he was acting strangely and brought him to the ER for further assessment.  On arrival he was afebrile hemodynamically stable.  Labs were pain which notable for lactic acid 1.2, sodium 132, creatinine 0.69, troponin 57, 58.  Respiratory viral panel was positive for COVID.  CMP was unrevealing.  Patient was admitted due to possible COVID encephalopathy.  Chest x-ray showed cardiomegaly.  CT head showed no acute findings.  CT pulmonary embolism study demonstrated no evidence of PE with bilateral pleural effusions suspicious for interstitial edema.   In the ER patient was started on ceftriaxone and azithromycin due to concern for underlying pneumonia.  He was given 40 mg of IV Lasix.   New events last 24 hours / Subjective: Patient encephalopathy improved.  Denies shortness of breath.  Remains on room air.  I spoke with daughter over the phone, she is quite worried about patient going home.  His wife has COVID and is weak at home.  They are worried about patient's mobility, needing help.  At baseline, he is independent of ADLs.  Assessment & Plan:   Principal Problem:   Acute metabolic encephalopathy Active Problems:   HTN (hypertension)   Mixed hyperlipidemia   Type 2 diabetes mellitus without complication (HCC)   COVID-19   Acute on chronic diastolic CHF (congestive heart failure) (HCC)   Demand ischemia   Acute metabolic encephalopathy secondary to COVID -MRI brain without any acute  abnormalities -Improving  COVID-19 -Currently stable on room air -Having symptoms over 3 weeks -Rocephin in setting of continued cough, fever  -CRP elevated 11.1 --> 5.7, trend  Acute on chronic systolic diastolic heart failure -BNP 1034.1 -CTA chest negative for PE, moderate bilateral pleural effusion and compressive atelectasis -Echocardiogram EF 25 to 30% with global hypokinesis -Consult cardiology today, sent message to Dr. Jacinto Halim and Dr. Rosemary Holms -IV Lasix, Coreg -Daily weight, strict I's and O's, fluid restriction diet  Diabetes -Semglee, sliding scale insulin   Demand ischemia in setting of above -Troponin trend remains flat 57, 58  Hypertension -Coreg, Avapro  Hypokalemia -Replace   DVT prophylaxis:  enoxaparin (LOVENOX) injection 40 mg Start: 08/21/22 2200 SCDs Start: 08/21/22 2047  Code Status: Full code Family Communication: Updated daughter over the phone today Disposition Plan: Home Status is: Inpatient Remains inpatient appropriate because: IV Lasix, cardiology consulted, PT eval    Antimicrobials:  Anti-infectives (From admission, onward)    Start     Dose/Rate Route Frequency Ordered Stop   08/22/22 1400  cefTRIAXone (ROCEPHIN) 1 g in sodium chloride 0.9 % 100 mL IVPB        1 g 200 mL/hr over 30 Minutes Intravenous Every 24 hours 08/22/22 1254     08/21/22 1445  cefTRIAXone (ROCEPHIN) 2 g in sodium chloride 0.9 % 100 mL IVPB  Status:  Discontinued        2 g 200 mL/hr over 30 Minutes Intravenous Every 24 hours 08/21/22 1430 08/21/22 2332   08/21/22 1445  azithromycin (ZITHROMAX) 500 mg in sodium  chloride 0.9 % 250 mL IVPB  Status:  Discontinued        500 mg 250 mL/hr over 60 Minutes Intravenous Every 24 hours 08/21/22 1430 08/21/22 2332        Objective: Vitals:   08/23/22 1305 08/23/22 2109 08/24/22 0423 08/24/22 0442  BP: 129/77 137/82 (!) 135/100   Pulse: 89 94 (!) 47   Resp: 17 18 20    Temp: 99.4 F (37.4 C) 99 F (37.2 C) 98  F (36.7 C)   TempSrc: Oral Oral    SpO2: 95% 96% 95%   Weight:    95.1 kg  Height:        Intake/Output Summary (Last 24 hours) at 08/24/2022 1226 Last data filed at 08/23/2022 1300 Gross per 24 hour  Intake 360 ml  Output --  Net 360 ml    Filed Weights   08/21/22 1622 08/23/22 0500 08/24/22 0442  Weight: 92.5 kg 90.5 kg 95.1 kg    Examination:  General exam: Appears calm and comfortable  Respiratory system: Respiratory effort normal. No respiratory distress. No conversational dyspnea. On room air  Cardiovascular system: S1 & S2 heard, RRR. No murmurs. No pedal edema. Gastrointestinal system: Abdomen is nondistended, soft and nontender. Normal bowel sounds heard. Central nervous system: Alert and oriented to self and place only, somewhat confused  Extremities: Symmetric in appearance  Skin: No rashes, lesions or ulcers on exposed skin   Data Reviewed: I have personally reviewed following labs and imaging studies  CBC: Recent Labs  Lab 08/21/22 1426 08/21/22 1451 08/21/22 2107 08/23/22 0505 08/24/22 0542  WBC 9.7  --  9.8 7.0 6.6  NEUTROABS 6.8  --   --   --   --   HGB 15.4 15.6 15.0 16.5 15.4  HCT 47.6 46.0 46.7 50.2 48.0  MCV 87.8  --  90.2 88.5 88.1  PLT 192  --  170 180 180    Basic Metabolic Panel: Recent Labs  Lab 08/21/22 1426 08/21/22 1451 08/21/22 2107 08/22/22 0549 08/23/22 0505 08/24/22 0542  NA 136 138  --  136 137 136  K 3.7 3.8  --  3.4* 3.6 3.6  CL 101  --   --  99 103 103  CO2 21*  --   --  22 23 26   GLUCOSE 126*  --   --  140* 196* 177*  BUN 13  --   --  12 19 20   CREATININE 0.69  --  0.80 0.78 0.75 0.51*  CALCIUM 9.0  --   --  8.2* 8.6* 8.6*  MG  --   --   --  1.8  --  1.9    GFR: Estimated Creatinine Clearance: 95.1 mL/min (A) (by C-G formula based on SCr of 0.51 mg/dL (L)). Liver Function Tests: Recent Labs  Lab 08/21/22 1426 08/23/22 0505 08/24/22 0542  AST 15 20 21   ALT 11 15 16   ALKPHOS 72 59 60  BILITOT 0.9 0.9 0.8   PROT 7.0 6.7 6.4*  ALBUMIN 4.2 3.2* 3.2*    No results for input(s): "LIPASE", "AMYLASE" in the last 168 hours. No results for input(s): "AMMONIA" in the last 168 hours. Coagulation Profile: Recent Labs  Lab 08/21/22 1430  INR 1.1    Cardiac Enzymes: No results for input(s): "CKTOTAL", "CKMB", "CKMBINDEX", "TROPONINI" in the last 168 hours. BNP (last 3 results) No results for input(s): "PROBNP" in the last 8760 hours. HbA1C: No results for input(s): "HGBA1C" in the last 72 hours. CBG:  Recent Labs  Lab 08/23/22 1148 08/23/22 1647 08/23/22 2111 08/24/22 0734 08/24/22 0844  GLUCAP 331* 205* 217* 186* 241*    Lipid Profile: No results for input(s): "CHOL", "HDL", "LDLCALC", "TRIG", "CHOLHDL", "LDLDIRECT" in the last 72 hours. Thyroid Function Tests: No results for input(s): "TSH", "T4TOTAL", "FREET4", "T3FREE", "THYROIDAB" in the last 72 hours. Anemia Panel: No results for input(s): "VITAMINB12", "FOLATE", "FERRITIN", "TIBC", "IRON", "RETICCTPCT" in the last 72 hours. Sepsis Labs: Recent Labs  Lab 08/21/22 1408 08/21/22 2107  LATICACIDVEN 1.2 1.2     Recent Results (from the past 240 hour(s))  Resp panel by RT-PCR (RSV, Flu A&B, Covid) Anterior Nasal Swab     Status: Abnormal   Collection Time: 08/21/22  2:30 PM   Specimen: Anterior Nasal Swab  Result Value Ref Range Status   SARS Coronavirus 2 by RT PCR POSITIVE (A) NEGATIVE Final    Comment: (NOTE) SARS-CoV-2 target nucleic acids are DETECTED.  The SARS-CoV-2 RNA is generally detectable in upper respiratory specimens during the acute phase of infection. Positive results are indicative of the presence of the identified virus, but do not rule out bacterial infection or co-infection with other pathogens not detected by the test. Clinical correlation with patient history and other diagnostic information is necessary to determine patient infection status. The expected result is Negative.  Fact Sheet for  Patients: BloggerCourse.com  Fact Sheet for Healthcare Providers: SeriousBroker.it  This test is not yet approved or cleared by the [place] FDA and  has been authorized for detection and/or diagnosis of SARS-CoV-2 by FDA under an Emergency Use Authorization (EUA).  This EUA will remain in effect (meaning this test can be used) for the duration of  the COVID-19 declaration under Section 564(b)(1) of the A ct, 21 U.S.C. section 360bbb-3(b)(1), unless the authorization is terminated or revoked sooner.     Influenza A by PCR NEGATIVE NEGATIVE Final   Influenza B by PCR NEGATIVE NEGATIVE Final    Comment: (NOTE) The Xpert Xpress SARS-CoV-2/FLU/RSV plus assay is intended as an aid in the diagnosis of influenza from Nasopharyngeal swab specimens and should not be used as a sole basis for treatment. Nasal washings and aspirates are unacceptable for Xpert Xpress SARS-CoV-2/FLU/RSV testing.  Fact Sheet for Patients: BloggerCourse.com  Fact Sheet for Healthcare Providers: SeriousBroker.it  This test is not yet approved or cleared by the [place] FDA and has been authorized for detection and/or diagnosis of SARS-CoV-2 by FDA under an Emergency Use Authorization (EUA). This EUA will remain in effect (meaning this test can be used) for the duration of the COVID-19 declaration under Section 564(b)(1) of the Act, 21 U.S.C. section 360bbb-3(b)(1), unless the authorization is terminated or revoked.     Resp Syncytial Virus by PCR NEGATIVE NEGATIVE Final    Comment: (NOTE) Fact Sheet for Patients: BloggerCourse.com  Fact Sheet for Healthcare Providers: SeriousBroker.it  This test is not yet approved or cleared by the [place] FDA and has been authorized for detection and/or diagnosis of SARS-CoV-2 by FDA under an Emergency Use  Authorization (EUA). This EUA will remain in effect (meaning this test can be used) for the duration of the COVID-19 declaration under Section 564(b)(1) of the Act, 21 U.S.C. section 360bbb-3(b)(1), unless the authorization is terminated or revoked.  Performed at [facility], [address], [place], [place] [address]   Blood Culture (routine x 2)     Status: None (Preliminary result)   Collection Time: 08/21/22  2:56 PM   Specimen: BLOOD  Result Value Ref Range Status   Specimen Description   Final    BLOOD BLOOD RIGHT HAND Performed at [facility], [address], [place], [place] [address]    Special Requests   Final    BOTTLES DRAWN AEROBIC AND ANAEROBIC Blood Culture adequate volume Performed at [facility], [address] [place], [place] [address]    Culture   Final    NO GROWTH 3 DAYS Performed at [facility], [address] [place], [place] [address]    Report Status PENDING  Incomplete  Blood Culture (routine x 2)     Status: None (Preliminary result)   Collection Time: 08/21/22  2:58 PM   Specimen: BLOOD  Result Value Ref Range Status   Specimen Description   Final    BLOOD BLOOD RIGHT ARM Performed at [facility], [address], [place], [place] [address]    Special Requests   Final    BOTTLES DRAWN AEROBIC AND ANAEROBIC Blood Culture adequate volume Performed at [facility], [address], [place], [place] [address]    Culture   Final    NO GROWTH 3 DAYS Performed at [facility], [address] [place], [place] [address]    Report Status PENDING  Incomplete      Radiology Studies: ECHOCARDIOGRAM COMPLETE  Result Date: 08/23/2022    ECHOCARDIOGRAM REPORT   Patient Name:   RAD SHROFF Date of Exam: 08/23/2022 Medical Rec #:  [id number]     Height:       70.0 in Accession #:    [id number]    Weight:       199.5 lb Date of Birth:   1951/01/02     BSA:          2.085 m Patient Age:    71 years      BP:           152/94 mmHg Patient Gender: M             HR:           92 bpm. Exam Location:  Inpatient Procedure: 2D Echo, Cardiac Doppler, Color Doppler and Intracardiac            Opacification Agent Indications:    Abnormal ECG  History:        Patient has prior history of Echocardiogram examinations, most                 recent 02/23/2021. Cardiomegaly and CHF, CAD, TIA,                 Signs/Symptoms:Altered Mental Status; Risk Factors:Diabetes,                 Dyslipidemia and Hypertension.  Sonographer:    Milda Smart Referring Phys: [id number] ROBERT DORRELL  Sonographer Comments: Image acquisition challenging due to patient body habitus and Image acquisition challenging due to respiratory motion. IMPRESSIONS  1. Left ventricular ejection fraction, by estimation, is 25 to 30%. The left ventricle has severely decreased function. The left ventricle demonstrates global hypokinesis. Left ventricular diastolic parameters are indeterminate.  2. Right ventricular systolic function is mildly reduced. The right ventricular size is normal.  3. Left atrial size was mildly dilated.  4. Right atrial size was mildly dilated.  5. The mitral valve is normal in structure. Mild mitral valve regurgitation. No evidence of mitral stenosis.  6. The aortic valve is tricuspid. There is mild calcification  of the aortic valve. Aortic valve regurgitation is not visualized. No aortic stenosis is present.  7. The inferior vena cava is dilated in size with >50% respiratory variability, suggesting right atrial pressure of 8 mmHg. FINDINGS  Left Ventricle: Left ventricular ejection fraction, by estimation, is 25 to 30%. The left ventricle has severely decreased function. The left ventricle demonstrates global hypokinesis. Definity contrast agent was given IV to delineate the left ventricular endocardial borders. The left ventricular internal cavity size was normal in  size. There is no left ventricular hypertrophy. Left ventricular diastolic parameters are indeterminate. Right Ventricle: The right ventricular size is normal. No increase in right ventricular wall thickness. Right ventricular systolic function is mildly reduced. Left Atrium: Left atrial size was mildly dilated. Right Atrium: Right atrial size was mildly dilated. Pericardium: Trivial pericardial effusion is present. The pericardial effusion is anterior to the right ventricle. Mitral Valve: The mitral valve is normal in structure. Mild mitral valve regurgitation. No evidence of mitral valve stenosis. Tricuspid Valve: The tricuspid valve is normal in structure. Tricuspid valve regurgitation is trivial. No evidence of tricuspid stenosis. Aortic Valve: The aortic valve is tricuspid. There is mild calcification of the aortic valve. Aortic valve regurgitation is not visualized. No aortic stenosis is present. Pulmonic Valve: The pulmonic valve was normal in structure. Pulmonic valve regurgitation is not visualized. No evidence of pulmonic stenosis. Aorta: The aortic root is normal in size and structure. Venous: The inferior vena cava is dilated in size with greater than 50% respiratory variability, suggesting right atrial pressure of 8 mmHg. IAS/Shunts: No atrial level shunt detected by color flow Doppler.  LEFT VENTRICLE PLAX 2D LVIDd:         5.50 cm   Diastology LVIDs:         5.10 cm   LV e' medial:    4.87 cm/s LV PW:         0.70 cm   LV E/e' medial:  20.3 LV IVS:        0.70 cm   LV e' lateral:   4.05 cm/s LVOT diam:     2.40 cm   LV E/e' lateral: 24.4 LVOT Area:     4.52 cm  RIGHT VENTRICLE             IVC RV S prime:     12.10 cm/s  IVC diam: 2.10 cm TAPSE (M-mode): 1.6 cm LEFT ATRIUM           Index        RIGHT ATRIUM           Index LA diam:      4.20 cm 2.01 cm/m   RA Area:     18.70 cm LA Vol (A2C): 55.6 ml 26.66 ml/m  RA Volume:   51.10 ml  24.51 ml/m LA Vol (A4C): 46.1 ml 22.11 ml/m   AORTA Ao Root  diam: 3.50 cm Ao Asc diam:  3.50 cm MITRAL VALVE MV Area (PHT): 4.93 cm    SHUNTS MV Decel Time: 154 msec    Systemic Diam: 2.40 cm MR Peak grad: 14.1 mmHg MR Vmax:      188.00 cm/s MV E velocity: 99.00 cm/s Arvilla Meres MD Electronically signed by Arvilla Meres MD Signature Date/Time: 08/23/2022/1:24:13 PM    Final    MR BRAIN WO CONTRAST  Result Date: 08/22/2022 CLINICAL DATA:  Altered mental status. EXAM: MRI HEAD WITHOUT CONTRAST TECHNIQUE: Multiplanar, multiecho pulse sequences of the brain and surrounding structures were obtained  without intravenous contrast. COMPARISON:  CT head without contrast 08/21/2022. MR head 06/22/2016 FINDINGS: Brain: No acute infarct, hemorrhage, or mass lesion is present. Moderate generalized atrophy and white matter disease bilaterally is similar to the prior exam. The ventricles are proportionate to the degree of atrophy. Dilated perivascular spaces are present within the basal ganglia. A remote right paramedian pontine infarct is present. Remote lacunar infarcts are present in the left cerebellum. No significant extraaxial fluid collection is present. The internal auditory canals are within normal limits. Vascular: Flow is present in the major intracranial arteries. Skull and upper cervical spine: Mild degenerative changes are present in the upper cervical spine. Craniocervical junction is normal. Marrow signal is normal. Sinuses/Orbits: Mild mucosal thickening is present throughout the ethmoid air cells and right frontal sinus. A fluid level is present in the right maxillary sinus. Mastoid air cells are clear. Bilateral lens replacements are noted. Globes and orbits are otherwise unremarkable. Other: IMPRESSION: 1. No acute intracranial abnormality or significant interval change. 2. Moderate generalized atrophy and white matter disease likely reflects the sequela of chronic microvascular ischemia. 3. Remote lacunar infarcts of the right paramedian pons and left  cerebellum. 4. Mild sinus disease. Electronically Signed   By: Marin Roberts M.D.   On: 08/22/2022 19:05      Scheduled Meds:  aspirin EC  81 mg Oral QHS   carvedilol  6.25 mg Oral BID WC   enoxaparin (LOVENOX) injection  40 mg Subcutaneous Q24H   furosemide  40 mg Intravenous Daily   insulin aspart  0-9 Units Subcutaneous TID WC   insulin aspart  3 Units Subcutaneous TID WC   insulin glargine-yfgn  20 Units Subcutaneous QHS   irbesartan  150 mg Oral Daily   Continuous Infusions:  cefTRIAXone (ROCEPHIN)  IV 1 g (08/23/22 1455)     LOS: 3 days   Time spent: 25 minutes   Noralee Stain, DO [facility] 08/24/2022, 12:26 PM   Available via Epic secure chat 7am-7pm After these hours, please refer to coverage provider listed on [email or web address]

## 2022-08-24 NOTE — Evaluation (Addendum)
Physical Therapy Evaluation Patient Details Name: Alan Wang MRN: [id number] DOB: May 19, 1950 Today's Date: 08/24/2022  History of Present Illness  72 yo male admitted with acute encephalopathy, COVID, fall. Hx of DM, TIA, CAD, COVID, OA  Clinical Impression  On eval, pt required Min A for mobility. He walked ~100 feet with a RW. Pt presents with general weakness, decreased activity tolerance, and impaired gait and balance. He participated well. Pt would benefit from continued rehab after this hospital stay. He could potentially discharge home with HHPT if he has 24/7 supervision/assist. He could also benefit from continued therapy in the hospital for another day or so before being discharged, if at all possible.        Recommendations for follow up therapy are one component of a multi-disciplinary discharge planning process, led by the attending physician.  Recommendations may be updated based on patient status, additional functional criteria and insurance authorization.  Follow Up Recommendations       Assistance Recommended at Discharge Frequent or constant Supervision/Assistance  Patient can return home with the following  A little help with walking and/or transfers;A little help with bathing/dressing/bathroom;Assistance with cooking/housework;Assist for transportation;Help with stairs or ramp for entrance    Equipment Recommendations Rolling walker (2 wheels)  Recommendations for Other Services  OT consult    Functional Status Assessment Patient has had a recent decline in their functional status and demonstrates the ability to make significant improvements in function in a reasonable and predictable amount of time.     Precautions / Restrictions Precautions Precautions: Fall Restrictions Weight Bearing Restrictions: No      Mobility  Bed Mobility Overal bed mobility: Needs Assistance Bed Mobility: Supine to Sit     Supine to sit: Min guard, HOB elevated     General  bed mobility comments: Min guard for safety. Increased time. Task was effortful for pt. No physical assistance provided. He relied on bedrail    Transfers Overall transfer level: Needs assistance Equipment used: Rolling walker (2 wheels) Transfers: Sit to/from Stand Sit to Stand: Min assist, From elevated surface           General transfer comment: Assist to rise, steady. Cues for safety, hand placement. 2 attempts to get to full stand    Ambulation/Gait Ambulation/Gait assistance: Min assist Gait Distance (Feet): 100 Feet Assistive device: Rolling walker (2 wheels) Gait Pattern/deviations: Step-through pattern, Decreased stride length       General Gait Details: Assist to stabilize throughout distance. Cues for safety. Pt tolerated distance well. Denied dizziness.  Stairs            Wheelchair Mobility    Modified Rankin (Stroke Patients Only)       Balance Overall balance assessment: Needs assistance         Standing balance support: Reliant on assistive device for balance, Bilateral upper extremity supported, During functional activity Standing balance-Leahy Scale: Poor                               Pertinent Vitals/Pain Pain Assessment Pain Assessment: No/denies pain    Home Living Family/patient expects to be discharged to:: Private residence Living Arrangements: Spouse/significant other Available Help at Discharge: Family Type of Home: House Home Access: Stairs to enter Entrance Stairs-Rails: Lawyer of Steps: 3 Alternate Level Stairs-Number of Steps: flight Home Layout: Multi-level;Able to live on main level with bedroom/bathroom Home Equipment: Rollator (4 wheels);Cane - single point  Prior Function Prior Level of Function : Independent/Modified Independent                     Hand Dominance        Extremity/Trunk Assessment   Upper Extremity Assessment Upper Extremity Assessment:  Defer to OT evaluation    Lower Extremity Assessment Lower Extremity Assessment: Generalized weakness    Cervical / Trunk Assessment Cervical / Trunk Assessment: Normal  Communication   Communication: No difficulties  Cognition Arousal/Alertness: Awake/alert Behavior During Therapy: WFL for tasks assessed/performed Overall Cognitive Status: Within Functional Limits for tasks assessed                                          General Comments      Exercises     Assessment/Plan    PT Assessment Patient needs continued PT services  PT Problem List Decreased strength;Decreased range of motion;Decreased activity tolerance;Decreased balance;Decreased mobility;Decreased knowledge of use of DME;Pain       PT Treatment Interventions DME instruction;Therapeutic exercise;Gait training;Balance training;Stair training;Functional mobility training;Therapeutic activities;Patient/family education    PT Goals (Current goals can be found in the Care Plan section)  Acute Rehab PT Goals Patient Stated Goal: home PT Goal Formulation: With patient Time For Goal Achievement: 09/07/22 Potential to Achieve Goals: Good    Frequency Min 1X/week     Co-evaluation               AM-PAC PT "6 Clicks" Mobility  Outcome Measure Help needed turning from your back to your side while in a flat bed without using bedrails?: A Little Help needed moving from lying on your back to sitting on the side of a flat bed without using bedrails?: A Little Help needed moving to and from a bed to a chair (including a wheelchair)?: A Little Help needed standing up from a chair using your arms (e.g., wheelchair or bedside chair)?: A Little Help needed to walk in hospital room?: A Little Help needed climbing 3-5 steps with a railing? : A Lot 6 Click Score: 17    End of Session Equipment Utilized During Treatment: Gait belt Activity Tolerance: Patient tolerated treatment well Patient left:  in bed;with call bell/phone within reach;with bed alarm set   PT Visit Diagnosis: Difficulty in walking, not elsewhere classified (R26.2);Muscle weakness (generalized) (M62.81)    Time: 1610-9604 PT Time Calculation (min) (ACUTE ONLY): 21 min   Charges:   PT Evaluation $PT Eval Low Complexity: 1 Low             Faye Ramsay, PT Acute Rehabilitation  Office: [phone]

## 2022-08-24 NOTE — Progress Notes (Signed)
Heart Failure Navigator Progress Note  Assessed for Heart & Vascular TOC clinic readiness.  Patient does not meet criteria due to [facility] patient.   Navigator will sign off at this time.   Ikram Riebe, BSN, RN Heart Failure Nurse Navigator Secure Chat Only   

## 2022-08-24 NOTE — Consult Note (Addendum)
CARDIOLOGY CONSULT NOTE  Patient ID: Alan Wang MRN: [id number] DOB/AGE: Mar 11, 1950 72 y.o.  Admit date: 08/21/2022 Referring Physician: [facility] hospitalist Reason for Consultation:  HFrEF  HPI:   72 y.o. Caucasian male  with hypertension, hyperlipidemia, type 2 DM w/diabetic retinopathy, CAD, multiple prior PCIs, h/o TIA, admitted with COVID-19 pneumonia, acute metabolic encephalopathy. EF reduced at 25-30%. Cardiology thus consulted.  Patient was admitted on 08/21/2022 with worsening confusion, and 3 week h/o cough, fever. He was found to have COVID pneumonia. BNP was elevated >1000. EF was noted 25-30%, previously 50-55% in 02/2021.  He is currently on room air, and breathing comfortably at rest.  He denies any chest pain, but had been having exertional dyspnea even prior to the diagnosis of COVID.  In addition, he reports that he has had generalized weakness and all extremities, for last several months.  There is no definite diagnosis for why he has such profound weakness.   Past Medical History:  Diagnosis Date   Arthritis    Asthma    Cancer (HCC)    skin   Coronary artery disease    COVID    Diabetes mellitus    type 2, insulin dependent for 30+ years   History of kidney stones 11/2012   Hyperlipemia    Hypertension    IDDM (insulin dependent diabetes mellitus)    Shortness of breath    Stroke (HCC)    hx TIA     Past Surgical History:  Procedure Laterality Date   ANGIOPLASTY  04/06/2011   x3    CARDIAC CATHETERIZATION     CARDIAC CATHETERIZATION N/A 09/16/2015   Procedure: Left Heart Cath and Coronary Angiography;  Surgeon: Yates Decamp, MD;  Location: [facility] INVASIVE CV LAB;  Service: Cardiovascular;  Laterality: N/A;   cataracts     CHOLECYSTECTOMY     HERNIA REPAIR     x2   KNEE ARTHROSCOPY     LEFT HEART CATHETERIZATION WITH CORONARY ANGIOGRAM N/A 03/08/2011   Procedure: LEFT HEART CATHETERIZATION WITH CORONARY ANGIOGRAM;  Surgeon: Pamella Pert, MD;  Location:  [facility] CATH LAB;  Service: Cardiovascular;  Laterality: N/A;   PERCUTANEOUS CORONARY STENT INTERVENTION (PCI-S) N/A 04/06/2011   Procedure: PERCUTANEOUS CORONARY STENT INTERVENTION (PCI-S);  Surgeon: Pamella Pert, MD;  Location: [facility] CATH LAB;  Service: Cardiovascular;  Laterality: N/A;   TOTAL SHOULDER ARTHROPLASTY Right 01/07/2022   Procedure: TOTAL SHOULDER ARTHROPLASTY;  Surgeon: Francena Hanly, MD;  Location: [facility] ORS;  Service: Orthopedics;  Laterality: Right;       Family History  Problem Relation Age of Onset   Emphysema Mother    COPD Mother    Alzheimer's disease Father    Asthma Paternal Aunt    Heart attack Paternal Grandfather      Social History: Social History   Socioeconomic History   Marital status: Married    Spouse name: Bonita Quin   Number of children: 2   Years of education: College   Highest education level: Not on file  Occupational History   Occupation: retired    Comment: Firefighter  Tobacco Use   Smoking status: Never   Smokeless tobacco: Never  Vaping Use   Vaping Use: Never used  Substance and Sexual Activity   Alcohol use: Yes    Comment: occasionally   Drug use: No   Sexual activity: Yes  Other Topics Concern   Not on file  Social History Narrative   Patient lives at home with his spouse.  Caffeine Use: occasionally   Social Determinants of Health   Financial Resource Strain: Not on file  Food Insecurity: No Food Insecurity (08/21/2022)   Hunger Vital Sign    Worried About Running Out of Food in the Last Year: Never true    Ran Out of Food in the Last Year: Never true  Transportation Needs: No Transportation Needs (08/21/2022)   PRAPARE - Administrator, Civil Service (Medical): No    Lack of Transportation (Non-Medical): No  Physical Activity: Not on file  Stress: Not on file  Social Connections: Not on file  Intimate Partner Violence: Not At Risk (08/21/2022)   Humiliation, Afraid, Rape, and Kick questionnaire     Fear of Current or Ex-Partner: No    Emotionally Abused: No    Physically Abused: No    Sexually Abused: No     Medications Prior to Admission  Medication Sig Dispense Refill Last Dose   acetaminophen (TYLENOL) 650 MG CR tablet Take 1,300 mg by mouth 2 (two) times Wang.   08/20/2022 at pm   Ascorbic Acid (VITAMIN C) 1000 MG tablet Take 1,000 mg by mouth Wang.   Past Week   aspirin EC 81 MG tablet Take 81 mg by mouth at bedtime.   08/20/2022 at 2100   carvedilol (COREG) 12.5 MG tablet Take 1 tablet (12.5 mg total) by mouth 2 (two) times Wang with a meal. 180 tablet 1 08/20/2022 at 2000   celecoxib (CELEBREX) 200 MG capsule Take 200 mg by mouth Wang.   08/20/2022   chlorpheniramine (CHLOR-TRIMETON) 4 MG tablet Take 4 mg by mouth at bedtime.   08/20/2022 at pm   Cholecalciferol (VITAMIN D) 2000 units CAPS Take 2,000 Units by mouth Wang.   Past Week   CINNAMON PO Take 2,000 mg by mouth 2 (two) times Wang.   Past Week   cyclobenzaprine (FLEXERIL) 10 MG tablet Take 10 mg by mouth at bedtime as needed for muscle spasms.   unk   DULoxetine (CYMBALTA) 30 MG capsule Take 30 mg by mouth at bedtime.   08/20/2022 at pm   FARXIGA 10 MG TABS tablet Take 5 mg by mouth Wang.   08/20/2022   insulin NPH (HUMULIN N,NOVOLIN N) 100 UNIT/ML injection Inject 10-35 Units into the skin See admin instructions. Inject 10 units into the skin before breakfast and 35 units before supper   08/20/2022   insulin regular (NOVOLIN R,HUMULIN R) 100 units/mL injection Inject 20-25 Units into the skin See admin instructions. Inject 20 units into the skin before breakfast and 25 units before supper   08/20/2022   Magnesium 500 MG TABS Take 500 mg by mouth Wang.      metFORMIN (GLUCOPHAGE-XR) 500 MG 24 hr tablet Take 2 tablets (1,000 mg total) by mouth 2 (two) times Wang.   08/20/2022   nitroGLYCERIN (NITROSTAT) 0.4 MG SL tablet Place 1 tablet (0.4 mg total) under the tongue every 5 (five) minutes as needed for up to 25 days for  chest pain. 25 tablet 3 unk   olmesartan (BENICAR) 20 MG tablet TAKE ONE TABLET BY MOUTH Wang 30 tablet 0 08/20/2022   ondansetron (ZOFRAN) 4 MG tablet Take 1 tablet (4 mg total) by mouth every 8 (eight) hours as needed for nausea or vomiting. 10 tablet 0 unk   REPATHA SURECLICK 140 MG/ML SOAJ Inject 140 mg as directed every 14 (fourteen) days.   08/16/2022   tiZANidine (ZANAFLEX) 4 MG tablet Take 1 tablet (4 mg total) by  mouth 3 (three) times Wang. (Patient taking differently: Take 4 mg by mouth every 8 (eight) hours as needed for muscle spasms.) 90 tablet 1 unk   traMADol (ULTRAM) 50 MG tablet Take 1 tablet (50 mg total) by mouth every 6 (six) hours as needed for moderate pain. (Patient taking differently: Take 100 mg by mouth every 6 (six) hours as needed for moderate pain.) 20 tablet 0 unk   TURMERIC PO Take 1 capsule by mouth Wang.   Past Week   ONETOUCH VERIO test strip USE TO TEST BLOOD SUGAR 4 TIMES Wang       Review of Systems  Constitutional:       Generalized weakness in all extremities  Cardiovascular:  Negative for chest pain, dyspnea on exertion, leg swelling, palpitations and syncope.  Neurological:        Generalized weakness in all extremities      Vitals:   08/23/22 2109 08/24/22 0423  BP: 137/82 (!) 135/100  Pulse: 94 (!) 47  Resp: 18 20  Temp: 99 F (37.2 C) 98 F (36.7 C)  SpO2: 96% 95%     Physical Exam: Physical Exam Vitals and nursing note reviewed.  Constitutional:      General: He is not in acute distress. Neck:     Vascular: No JVD.  Cardiovascular:     Rate and Rhythm: Normal rate and regular rhythm.     Heart sounds: Normal heart sounds. No murmur heard. Pulmonary:     Effort: Pulmonary effort is normal.     Breath sounds: Normal breath sounds. No wheezing or rales.  Musculoskeletal:     Right lower leg: No edema.     Left lower leg: No edema.  Neurological:     Motor: Weakness (Generalized) present.        Imaging/tests reviewed  and independently interpreted: Lab Results: CTA chest 08/21/2022: No evidence of pulmonary embolism.  Moderate bilateral pleural effusions and compressive atelectasis.  Diffuse interstitial prominence, suspicious for interstitial edema.  Cardiac Studies:  Telemetry 08/24/2022: Frequent PVCs No ventricular tachycardia  EKG 08/21/2022: Sinus tachycardia Low voltage, extremity leads Nonspecific T abnormalities, lateral leads  Echocardiogram 08/23/2022:  1. Left ventricular ejection fraction, by estimation, is 25 to 30%. The  left ventricle has severely decreased function. The left ventricle  demonstrates global hypokinesis. Left ventricular diastolic parameters are  indeterminate.   2. Right ventricular systolic function is mildly reduced. The right  ventricular size is normal.   3. Left atrial size was mildly dilated.   4. Right atrial size was mildly dilated.   5. The mitral valve is normal in structure. Mild mitral valve  regurgitation. No evidence of mitral stenosis.   6. The aortic valve is tricuspid. There is mild calcification of the  aortic valve. Aortic valve regurgitation is not visualized. No aortic  stenosis is present.   7. The inferior vena cava is dilated in size with >50% respiratory  variability, suggesting right atrial pressure of 8 mmHg.   Echocardiogram 02/23/2021:  Left ventricle cavity is normal in size. Mild concentric hypertrophy of  the left ventricle. Normal global wall motion. Normal LV systolic function  with visual EF 50-55%. Doppler evidence of grade I (impaired) diastolic  dysfunction, normal LAP.  No significant valvular abnormality.  IVC not seen.  Compared to previous study in 2017, LVEF is imorived from 45-50%.   Coronary angiography 2017: H/O PTCA 04/06/11: Proximal and mid circumflex 2.75x22 mm Resolute stent. 03/08/11: Stent to large Ramus intermediate with  3.5x15 Integrity stent. Mid LAD 3.0x23 and 2.75x12 Xience placed in 05/2008. All of  them are widely patent.    Mild diffuse coronary artery disease otherwise. Flush occluded PL branch of the right coronary artery, moderate area of distribution but very small calibered with multiple branches. Circumflex gives collaterals. Not amenable for PCI. The stress test ischemia correlates with the lesion.  Assessment & Recommendations:  72 y.o. Caucasian male  with hypertension, hyperlipidemia, type 2 DM w/diabetic retinopathy, CAD, multiple prior PCIs, h/o TIA, admitted with COVID-19 pneumonia, acute metabolic encephalopathy, HFrEF  HFrEF: Possibly underlying ischemic cardiomyopathy, with acute insult of COVID pneumonia. It is possible that his EF was likely low even prior to his COVID diagnosis. Fairly euvolemic at this time. Recommend GDMT for HFrEF Change irbesartan (home olmesartan) to Entresto 24-26 mg bid. Add Jardiance 10 mg Wang.   Continue Coreg at 6.25 mg bid. Will add spironolactone outpatient.   Okay to change IV Lasix to PO 40 mg Wang  He will need further ischemic evaluation.  This can be performed outpatient after he recovers from his current COVID illness.  I will arrange outpatient follow-up with Dr. Jacinto Halim in 2 weeks.  At that time, we can discuss stress test versus left and right heart catheterization.  Regardless of his heart failure, I remain concerned about his generalized weakness and absence of any clear diagnosis.  It appears that his wife is also sick with COVID.  Agree with physical therapy evaluation to decide on disposition.  CAD: Multiple prior PCIs. No angina symptoms.  His dyspnea could be multifactorial including COVID-pneumonia, HFrEF, as well as angina equivalent symptoms, as they have predated his COVID diagnosis.   Continue Aspirin 81 mg Wang. Add Crestor 20 mg Wang, if patient agreeable. Will arrange outpatient ischemic workup, as discussed above.  Management of COVID-pneumonia as per primary team.  Will sign off and arrange outpatient  follow up. Please call us back in case of any questions.  Discussed interpretation of tests and management recommendations with the primary team     Elder Negus, MD Pager: [phone] Office: [phone]

## 2022-08-25 DIAGNOSIS — G9341 Metabolic encephalopathy: Secondary | ICD-10-CM | POA: Diagnosis not present

## 2022-08-25 LAB — CBC
HCT: 56.4 % — ABNORMAL HIGH (ref 39.0–52.0)
Hemoglobin: 18 g/dL — ABNORMAL HIGH (ref 13.0–17.0)
MCH: 28.8 pg (ref 26.0–34.0)
MCHC: 31.9 g/dL (ref 30.0–36.0)
MCV: 90.1 fL (ref 80.0–100.0)
Platelets: 181 10*3/uL (ref 150–400)
RBC: 6.26 MIL/uL — ABNORMAL HIGH (ref 4.22–5.81)
RDW: 13.9 % (ref 11.5–15.5)
WBC: 6.9 10*3/uL (ref 4.0–10.5)
nRBC: 0 % (ref 0.0–0.2)

## 2022-08-25 LAB — BASIC METABOLIC PANEL
Anion gap: 10 (ref 5–15)
BUN: 20 mg/dL (ref 8–23)
CO2: 27 mmol/L (ref 22–32)
Calcium: 9.2 mg/dL (ref 8.9–10.3)
Chloride: 102 mmol/L (ref 98–111)
Creatinine, Ser: 0.9 mg/dL (ref 0.61–1.24)
GFR, Estimated: >60 mL/min (ref >60)
Glucose, Bld: 141 mg/dL — ABNORMAL HIGH (ref 70–99)
Potassium: 4.1 mmol/L (ref 3.5–5.1)
Sodium: 139 mmol/L (ref 135–145)

## 2022-08-25 LAB — GLUCOSE, CAPILLARY
Glucose-Capillary: 159 mg/dL — ABNORMAL HIGH (ref 70–99)
Glucose-Capillary: 251 mg/dL — ABNORMAL HIGH (ref 70–99)
Glucose-Capillary: 133 mg/dL — ABNORMAL HIGH (ref 70–99)
Glucose-Capillary: 189 mg/dL — ABNORMAL HIGH (ref 70–99)

## 2022-08-25 LAB — MAGNESIUM: Magnesium: 2.1 mg/dL (ref 1.7–2.4)

## 2022-08-25 LAB — C-REACTIVE PROTEIN: CRP: 3.4 mg/dL — ABNORMAL HIGH (ref <1.0)

## 2022-08-25 LAB — CULTURE, BLOOD (ROUTINE X 2)

## 2022-08-25 MED ORDER — FUROSEMIDE 40 MG PO TABS
40.0000 mg | ORAL_TABLET | Freq: Every day | ORAL | Status: DC
  Administered 2022-08-25 – 2022-08-27 (×3): 40 mg via ORAL
  Filled 2022-08-25 (×3): qty 1

## 2022-08-25 NOTE — Evaluation (Signed)
Occupational Therapy Evaluation Patient Details Name: Alan Wang MRN: [id number] DOB: 13-Jun-1950 Today's Date: 08/25/2022   History of Present Illness 72 yr old male admitted with altered mental status and found to have acute encephalopathy and COVID-19. Hx of DM, TIA, CAD, COVID, OA)   Clinical Impression    The pt is currently presenting slightly below his baseline level of functioning for self-care management (he was independent with ADLs), as he currently requires min guard to min assist for tasks including sit to stand, ambulating, and simulated toileting. He was noted to be with intermittent unsteadiness in standing, with the pt reporting ongoing issues with his balance at his baseline. He also reported feelings of generalized weakness. He will benefit from further OT services in the acute setting to maximize his safety & independence with self care tasks. OT recommends he have intermittent supervision, should he return home at discharge.      Recommendations for follow up therapy are one component of a multi-disciplinary discharge planning process, led by the attending physician.  Recommendations may be updated based on patient status, additional functional criteria and insurance authorization.   Assistance Recommended at Discharge Intermittent Supervision/Assistance  Patient can return home with the following Help with stairs or ramp for entrance;Assist for transportation;Assistance with cooking/housework;A little help with bathing/dressing/bathroom    Functional Status Assessment  Patient has had a recent decline in their functional status and demonstrates the ability to make significant improvements in function in a reasonable and predictable amount of time.  Equipment Recommendations  None recommended by OT       Precautions / Restrictions Restrictions Weight Bearing Restrictions: No Other Position/Activity Restrictions: airborne/contact      Mobility Bed Mobility Overal  bed mobility: Needs Assistance Bed Mobility: Supine to Sit     Supine to sit: Min guard, HOB elevated          Transfers Overall transfer level: Needs assistance Equipment used: None Transfers: Sit to/from Stand Sit to Stand: Min guard                  Balance Overall balance assessment: Needs assistance   Sitting balance-Leahy Scale: Good         Standing balance comment: min guard              ADL either performed or assessed with clinical judgement   ADL   Eating/Feeding: Independent;Sitting Eating/Feeding Details (indicate cue type and reason): based on clinical judgement Grooming: Min guard;Standing Grooming Details (indicate cue type and reason): at sink level, based on clinical judgement         Upper Body Dressing : Set up;Sitting Upper Body Dressing Details (indicate cue type and reason): simulated seated  EOB Lower Body Dressing: Supervision/safety Lower Body Dressing Details (indicate cue type and reason): He demonstrated the figure 4 technique, in order to doff then donn his socks seated EOB. Toilet Transfer: Min guard;Ambulation                   Vision   Additional Comments: he correctly read the time depicted on the wall clock            Pertinent Vitals/Pain Pain Assessment Pain Assessment: No/denies pain     Hand Dominance Right   Extremity/Trunk Assessment Upper Extremity Assessment Upper Extremity Assessment: Overall WFL for tasks assessed   Lower Extremity Assessment Lower Extremity Assessment: Overall WFL for tasks assessed       Communication Communication Communication: No difficulties  Cognition Arousal/Alertness: Awake/alert Behavior During Therapy: WFL for tasks assessed/performed Overall Cognitive Status: Within Functional Limits for tasks assessed                                                  Home Living Family/patient expects to be discharged to:: Private  residence Living Arrangements: Spouse/significant other Available Help at Discharge: Family Type of Home: House       Home Layout: Multi-level;Able to live on main level with bedroom/bathroom Alternate Level Stairs-Number of Steps: 3 story home, however he resides on the main level of the home   Bathroom Shower/Tub: Walk-in shower         Home Equipment: Gilmer Mor - single point          Prior Functioning/Environment Prior Level of Function : Independent/Modified Independent             Mobility Comments: Independent with ambulation ADLs Comments: He was independent with ADLs and ambulation.        OT Problem List: Decreased strength;Impaired balance (sitting and/or standing);Decreased safety awareness;Decreased knowledge of use of DME or AE      OT Treatment/Interventions: Self-care/ADL training;Therapeutic exercise;Energy conservation;DME and/or AE instruction;Therapeutic activities;Balance training;Patient/family education    OT Goals(Current goals can be found in the care plan section) Acute Rehab OT Goals OT Goal Formulation: With patient Time For Goal Achievement: 09/08/22 Potential to Achieve Goals: Good ADL Goals Pt Will Perform Grooming: with modified independence;standing Pt Will Perform Lower Body Dressing: with modified independence;sit to/from stand Pt Will Transfer to Toilet: with modified independence;ambulating Pt Will Perform Toileting - Clothing Manipulation and hygiene: with modified independence;sit to/from stand  OT Frequency: Min 1X/week       AM-PAC OT "6 Clicks" Daily Activity     Outcome Measure Help from another person eating meals?: None Help from another person taking care of personal grooming?: A Little Help from another person toileting, which includes using toliet, bedpan, or urinal?: A Little Help from another person bathing (including washing, rinsing, drying)?: None Help from another person to put on and taking off regular upper  body clothing?: None Help from another person to put on and taking off regular lower body clothing?: None 6 Click Score: 22   End of Session Equipment Utilized During Treatment: Other (comment) (None) Nurse Communication: Mobility status  Activity Tolerance: Patient tolerated treatment well Patient left: in bed;with call bell/phone within reach  OT Visit Diagnosis: Unsteadiness on feet (R26.81);Muscle weakness (generalized) (M62.81)                Time: 1610-9604 OT Time Calculation (min): 25 min Charges:  OT General Charges $OT Visit: 1 Visit OT Evaluation $OT Eval Low Complexity: 1 Low    Koben Daman L Cartier Mapel, OTR/L 08/25/2022, 1:02 PM

## 2022-08-25 NOTE — Progress Notes (Signed)
PROGRESS NOTE    Alan Wang  UJW:[id number] DOB: 09-Oct-1950 DOA: 08/21/2022 PCP: Henrine Screws, MD     Brief Narrative:  Alan Wang is a 72 y.o. male with medical history significant of CAD, type 2 diabetes, hyperlipidemia, hypertension, prior TIA who presents emergency department with altered mental status.  Patient reports that he had a trip in [place] and has been having symptoms for the last 3 weeks.  He has been having worsening confusion.  His wife reported that he was acting strangely and brought him to the ER for further assessment. Respiratory viral panel was positive for COVID. Patient was admitted due to possible COVID encephalopathy.    New events last 24 hours / Subjective: Denies any new complaints.  Assessment & Plan:   Principal Problem:   Acute metabolic encephalopathy Active Problems:   HTN (hypertension)   Mixed hyperlipidemia   Type 2 diabetes mellitus without complication (HCC)   COVID-19   Acute on chronic diastolic CHF (congestive heart failure) (HCC)   Demand ischemia   Acute metabolic encephalopathy likely secondary to COVID MRI brain without any acute abnormalities Improving  COVID-19 Currently stable on room air Having symptoms over 3 weeks Rocephin in setting of continued cough, fever  CRP elevated 11.1 --> 5.7-->3.4  Acute on chronic systolic diastolic heart failure BNP 1034.1 CTA chest negative for PE, moderate bilateral pleural effusion and compressive atelectasis Echocardiogram EF 25 to 30% with global hypokinesis Cardiology consulted, appreciate recs, further ischemic w/u as outpatient Lasix, entresto, jardiance, coreg Daily weight, strict I's and O's, fluid restriction diet  Diabetes mellitus type 2 Semglee, sliding scale insulin   Demand ischemia in setting of above Troponin trend remains flat 57, 58  Hypertension Coreg     DVT prophylaxis:  enoxaparin (LOVENOX) injection 40 mg Start: 08/21/22 2200 SCDs Start:  08/21/22 2047  Code Status: Full code Family Communication: None at bedside Disposition Plan: Home Status is: Inpatient Remains inpatient appropriate because: Level of care    Antimicrobials:  Anti-infectives (From admission, onward)    Start     Dose/Rate Route Frequency Ordered Stop   08/22/22 1400  cefTRIAXone (ROCEPHIN) 1 g in sodium chloride 0.9 % 100 mL IVPB        1 g 200 mL/hr over 30 Minutes Intravenous Every 24 hours 08/22/22 1254     08/21/22 1445  cefTRIAXone (ROCEPHIN) 2 g in sodium chloride 0.9 % 100 mL IVPB  Status:  Discontinued        2 g 200 mL/hr over 30 Minutes Intravenous Every 24 hours 08/21/22 1430 08/21/22 2332   08/21/22 1445  azithromycin (ZITHROMAX) 500 mg in sodium chloride 0.9 % 250 mL IVPB  Status:  Discontinued        500 mg 250 mL/hr over 60 Minutes Intravenous Every 24 hours 08/21/22 1430 08/21/22 2332        Objective: Vitals:   08/24/22 2012 08/25/22 0500 08/25/22 0500 08/25/22 1238  BP: (!) 142/73  (!) 139/109 120/80  Pulse: 81  68 88  Resp: 20  18 18   Temp: 99.3 F (37.4 C)  98.3 F (36.8 C) 98.7 F (37.1 C)  TempSrc:    Oral  SpO2: 97%  96% 98%  Weight:  95.8 kg    Height:        Intake/Output Summary (Last 24 hours) at 08/25/2022 1820 Last data filed at 08/25/2022 1300 Gross per 24 hour  Intake 240 ml  Output 1500 ml  Net -1260 ml  Filed Weights   08/23/22 0500 08/24/22 0442 08/25/22 0500  Weight: 90.5 kg 95.1 kg 95.8 kg    Examination:  General: NAD  Cardiovascular: S1, S2 present Respiratory: CTAB Abdomen: Soft, nontender, nondistended, bowel sounds present Musculoskeletal: No bilateral pedal edema noted Skin: Normal Psychiatry: Normal mood    Data Reviewed: I have personally reviewed following labs and imaging studies  CBC: Recent Labs  Lab 08/21/22 1426 08/21/22 1451 08/21/22 2107 08/23/22 0505 08/24/22 0542 08/25/22 0619  WBC 9.7  --  9.8 7.0 6.6 6.9  NEUTROABS 6.8  --   --   --   --   --   HGB  15.4 15.6 15.0 16.5 15.4 18.0*  HCT 47.6 46.0 46.7 50.2 48.0 56.4*  MCV 87.8  --  90.2 88.5 88.1 90.1  PLT 192  --  170 180 180 181   Basic Metabolic Panel: Recent Labs  Lab 08/21/22 1426 08/21/22 1451 08/21/22 2107 08/22/22 0549 08/23/22 0505 08/24/22 0542 08/25/22 0619  NA 136 138  --  136 137 136 139  K 3.7 3.8  --  3.4* 3.6 3.6 4.1  CL 101  --   --  99 103 103 102  CO2 21*  --   --  22 23 26 27   GLUCOSE 126*  --   --  140* 196* 177* 141*  BUN 13  --   --  12 19 20 20   CREATININE 0.69  --  0.80 0.78 0.75 0.51* 0.90  CALCIUM 9.0  --   --  8.2* 8.6* 8.6* 9.2  MG  --   --   --  1.8  --  1.9 2.1   GFR: Estimated Creatinine Clearance: 84.9 mL/min (by C-G formula based on SCr of 0.9 mg/dL). Liver Function Tests: Recent Labs  Lab 08/21/22 1426 08/23/22 0505 08/24/22 0542  AST 15 20 21   ALT 11 15 16   ALKPHOS 72 59 60  BILITOT 0.9 0.9 0.8  PROT 7.0 6.7 6.4*  ALBUMIN 4.2 3.2* 3.2*   No results for input(s): "LIPASE", "AMYLASE" in the last 168 hours. No results for input(s): "AMMONIA" in the last 168 hours. Coagulation Profile: Recent Labs  Lab 08/21/22 1430  INR 1.1   Cardiac Enzymes: No results for input(s): "CKTOTAL", "CKMB", "CKMBINDEX", "TROPONINI" in the last 168 hours. BNP (last 3 results) No results for input(s): "PROBNP" in the last 8760 hours. HbA1C: No results for input(s): "HGBA1C" in the last 72 hours. CBG: Recent Labs  Lab 08/24/22 1700 08/24/22 2014 08/25/22 0725 08/25/22 1152 08/25/22 1625  GLUCAP 294* 280* 159* 251* 133*   Lipid Profile: No results for input(s): "CHOL", "HDL", "LDLCALC", "TRIG", "CHOLHDL", "LDLDIRECT" in the last 72 hours. Thyroid Function Tests: No results for input(s): "TSH", "T4TOTAL", "FREET4", "T3FREE", "THYROIDAB" in the last 72 hours. Anemia Panel: No results for input(s): "VITAMINB12", "FOLATE", "FERRITIN", "TIBC", "IRON", "RETICCTPCT" in the last 72 hours. Sepsis Labs: Recent Labs  Lab 08/21/22 1408  08/21/22 2107  LATICACIDVEN 1.2 1.2    Recent Results (from the past 240 hour(s))  Resp panel by RT-PCR (RSV, Flu A&B, Covid) Anterior Nasal Swab     Status: Abnormal   Collection Time: 08/21/22  2:30 PM   Specimen: Anterior Nasal Swab  Result Value Ref Range Status   SARS Coronavirus 2 by RT PCR POSITIVE (A) NEGATIVE Final    Comment: (NOTE) SARS-CoV-2 target nucleic acids are DETECTED.  The SARS-CoV-2 RNA is generally detectable in upper respiratory specimens during the acute phase of infection. Positive  results are indicative of the presence of the identified virus, but do not rule out bacterial infection or co-infection with other pathogens not detected by the test. Clinical correlation with patient history and other diagnostic information is necessary to determine patient infection status. The expected result is Negative.  Fact Sheet for Patients: BloggerCourse.com  Fact Sheet for Healthcare Providers: SeriousBroker.it  This test is not yet approved or cleared by the [place] FDA and  has been authorized for detection and/or diagnosis of SARS-CoV-2 by FDA under an Emergency Use Authorization (EUA).  This EUA will remain in effect (meaning this test can be used) for the duration of  the COVID-19 declaration under Section 564(b)(1) of the A ct, 21 U.S.C. section 360bbb-3(b)(1), unless the authorization is terminated or revoked sooner.     Influenza A by PCR NEGATIVE NEGATIVE Final   Influenza B by PCR NEGATIVE NEGATIVE Final    Comment: (NOTE) The Xpert Xpress SARS-CoV-2/FLU/RSV plus assay is intended as an aid in the diagnosis of influenza from Nasopharyngeal swab specimens and should not be used as a sole basis for treatment. Nasal washings and aspirates are unacceptable for Xpert Xpress SARS-CoV-2/FLU/RSV testing.  Fact Sheet for Patients: BloggerCourse.com  Fact Sheet for Healthcare  Providers: SeriousBroker.it  This test is not yet approved or cleared by the [place] FDA and has been authorized for detection and/or diagnosis of SARS-CoV-2 by FDA under an Emergency Use Authorization (EUA). This EUA will remain in effect (meaning this test can be used) for the duration of the COVID-19 declaration under Section 564(b)(1) of the Act, 21 U.S.C. section 360bbb-3(b)(1), unless the authorization is terminated or revoked.     Resp Syncytial Virus by PCR NEGATIVE NEGATIVE Final    Comment: (NOTE) Fact Sheet for Patients: BloggerCourse.com  Fact Sheet for Healthcare Providers: SeriousBroker.it  This test is not yet approved or cleared by the [place] FDA and has been authorized for detection and/or diagnosis of SARS-CoV-2 by FDA under an Emergency Use Authorization (EUA). This EUA will remain in effect (meaning this test can be used) for the duration of the COVID-19 declaration under Section 564(b)(1) of the Act, 21 U.S.C. section 360bbb-3(b)(1), unless the authorization is terminated or revoked.  Performed at [facility], [address], [place], [place] [address]   Blood Culture (routine x 2)     Status: None (Preliminary result)   Collection Time: 08/21/22  2:56 PM   Specimen: BLOOD  Result Value Ref Range Status   Specimen Description   Final    BLOOD BLOOD RIGHT HAND Performed at [facility], [address], [place], [place] [address]    Special Requests   Final    BOTTLES DRAWN AEROBIC AND ANAEROBIC Blood Culture adequate volume Performed at [facility], [address], [place], [place] [address]    Culture   Final    NO GROWTH 4 DAYS Performed at [facility], [address] [place], [place] [address]    Report Status PENDING  Incomplete  Blood Culture (routine x 2)     Status: None (Preliminary  result)   Collection Time: 08/21/22  2:58 PM   Specimen: BLOOD  Result Value Ref Range Status   Specimen Description   Final    BLOOD BLOOD RIGHT ARM Performed at [facility], [address] [place], [place] [address]    Special Requests   Final    BOTTLES DRAWN AEROBIC AND ANAEROBIC Blood Culture adequate volume Performed at  [facility], [address], [place], [place] [address]    Culture   Final    NO GROWTH 4 DAYS Performed at [facility], [address], [place], [place] [address]    Report Status PENDING  Incomplete      Radiology Studies: No results found.    Scheduled Meds:  aspirin EC  81 mg Oral QHS   carvedilol  6.25 mg Oral BID WC   empagliflozin  10 mg Oral Daily   enoxaparin (LOVENOX) injection  40 mg Subcutaneous Q24H   furosemide  40 mg Oral Daily   insulin aspart  0-9 Units Subcutaneous TID WC   insulin aspart  3 Units Subcutaneous TID WC   insulin glargine-yfgn  20 Units Subcutaneous QHS   sacubitril-valsartan  1 tablet Oral BID   Continuous Infusions:  cefTRIAXone (ROCEPHIN)  IV 1 g (08/25/22 1412)     LOS: 4 days     Briant Cedar, MD [facility] 08/25/2022, 6:20 PM   Available via Epic secure chat 7am-7pm After these hours, please refer to coverage provider listed on [email or web address]

## 2022-08-25 NOTE — Inpatient Diabetes Management (Signed)
Inpatient Diabetes Program Recommendations  AACE/ADA: New Consensus Statement on Inpatient Glycemic Control (2015)  Target Ranges:  Prepandial:   less than 140 mg/dL      Peak postprandial:   less than 180 mg/dL (1-2 hours)      Critically ill patients:  140 - 180 mg/dL   Lab Results  Component Value Date   GLUCAP 251 (H) 08/25/2022   HGBA1C 6.8 (H) 12/29/2021    Review of Glycemic Control  Latest Reference Range & Units 08/24/22 08:44 08/24/22 12:27 08/24/22 17:00 08/24/22 20:14 08/25/22 07:25 08/25/22 11:52  Glucose-Capillary 70 - 99 mg/dL 161 (H) 096 (H) 045 (H) 280 (H) 159 (H) 251 (H)  (H): Data is abnormally high  Diabetes history: DM 2 Outpatient Diabetes medications:  NPH 10 units q AM NPH 35 units q supper Regular 20 units with breakfast and Regular 25 units with lunch Metformin 1000 mg bid Current orders for Inpatient glycemic control:  Novolog 0-9 units tid with meals Novolog 3 units TID with meals  Semglee 20 units q HS  Inpatient Diabetes Program Recommendations:    Please increase meal coverage to Novolog 5 units TID with meals if he consumes at least 50%.  Will continue to follow while inpatient.  Thank you, Dulce Sellar, MSN, CDCES Diabetes Coordinator Inpatient Diabetes Program [phone] (team pager from 8a-5p)

## 2022-08-25 NOTE — Progress Notes (Addendum)
Physical Therapy Treatment Patient Details Name: Alan Wang MRN: [id number] DOB: 08-Oct-1950 Today's Date: 08/25/2022   History of Present Illness 72 yo male admitted with acute encephalopathy, COVID, fall. Hx of DM, TIA, CAD, COVID, OA    PT Comments    Improved strength and mobility on today. Pt able to ambulate without an assistive device. No overt LOB. He tolerated activity well.    Recommendations for follow up therapy are one component of a multi-disciplinary discharge planning process, led by the attending physician.  Recommendations may be updated based on patient status, additional functional criteria and insurance authorization.  Follow Up Recommendations       Assistance Recommended at Discharge Intermittent Supervision/Assistance  Patient can return home with the following A little help with walking and/or transfers;A little help with bathing/dressing/bathroom;Assistance with cooking/housework;Assist for transportation;Help with stairs or ramp for entrance   Equipment Recommendations  None recommended by PT    Recommendations for Other Services       Precautions / Restrictions Precautions Precautions: Fall Precaution Comments: airborne/contact Restrictions Weight Bearing Restrictions: No Other Position/Activity Restrictions: airborne/contact     Mobility  Bed Mobility   Bed Mobility: Supine to Sit     Supine to sit: Supervision     General bed mobility comments: Supv for safety, lines.    Transfers Overall transfer level: Needs assistance   Transfers: Sit to/from Stand Sit to Stand: Min guard           General transfer comment: Min guard for safety. Increased time.    Ambulation/Gait Ambulation/Gait assistance: Min guard Gait Distance (Feet): 175 Feet Assistive device: Rolling walker (2 wheels), None Gait Pattern/deviations: Step-through pattern, Decreased stride length       General Gait Details: walked x 1 with RW-Supv level ~75  feet. Then walked x 1 without device Min guard A another 100 feet. No overt LOB. Mild unsteadiness. Denies dizziness.   Stairs             Wheelchair Mobility    Modified Rankin (Stroke Patients Only)       Balance Overall balance assessment: Needs assistance         Standing balance support: During functional activity Standing balance-Leahy Scale: Fair                              Cognition Arousal/Alertness: Awake/alert Behavior During Therapy: WFL for tasks assessed/performed Overall Cognitive Status: Within Functional Limits for tasks assessed                                          Exercises      General Comments        Pertinent Vitals/Pain Pain Assessment Pain Assessment: No/denies pain    Home Living Family/patient expects to be discharged to:: Private residence Living Arrangements: Spouse/significant other Available Help at Discharge: Family Type of Home: House       Alternate Level Stairs-Number of Steps: 3 story home, however he resides on the main level of the home Home Layout: Multi-level;Able to live on main level with bedroom/bathroom Home Equipment: Gilmer Mor - single point      Prior Function            PT Goals (current goals can now be found in the care plan section) Progress towards PT goals: Progressing toward goals  Frequency    Min 1X/week      PT Plan Current plan remains appropriate    Co-evaluation              AM-PAC PT "6 Clicks" Mobility   Outcome Measure  Help needed turning from your back to your side while in a flat bed without using bedrails?: None Help needed moving from lying on your back to sitting on the side of a flat bed without using bedrails?: None Help needed moving to and from a bed to a chair (including a wheelchair)?: None Help needed standing up from a chair using your arms (e.g., wheelchair or bedside chair)?: None Help needed to walk in hospital room?:  A Little Help needed climbing 3-5 steps with a railing? : A Little 6 Click Score: 22    End of Session Equipment Utilized During Treatment: Gait belt Activity Tolerance: Patient tolerated treatment well Patient left: in bed;with call bell/phone within reach;with bed alarm set   PT Visit Diagnosis: Difficulty in walking, not elsewhere classified (R26.2);Muscle weakness (generalized) (M62.81)     Time: 4034-7425 PT Time Calculation (min) (ACUTE ONLY): 26 min  Charges:  $Gait Training: 23-37 mins                         Faye Ramsay, PT Acute Rehabilitation  Office: [phone]

## 2022-08-25 NOTE — TOC Initial Note (Addendum)
Transition of Care [facility] - Initial/Assessment Note    Patient Details  Name: Alan Wang MRN: [id number] Date of Birth: [birth year]  [facility]) CM/SW Contact:    Otelia Santee, LCSW Phone Number: 08/25/2022, 2:56 PM  Clinical Narrative:                 Met with pt at bedside to discuss discharge plans. CSW discussed recommendation for [facility]. Pt declines HH at this time stating he does "not feel comfortable with people coming into the house."  Pt more agreeable to SNF however, pt progressing with PT and may no longer qualify, pt also COVID + and would require 10 day isolation period from date of + test. Pt shares that he may have a RW at home but, is not positive. Pt consented for CSW to reach out to wife to discuss.  TOC will continue to follow for disposition planning.    Expected Discharge Plan: Home/Self Care Barriers to Discharge: Continued Medical Work up   Patient Goals and CMS Choice Patient states their goals for this hospitalization and ongoing recovery are:: To go home CMS Medicare.gov Compare Post Acute Care list provided to:: Patient Choice offered to / list presented to : Patient [place] ownership interest in [facility].provided to:: Patient    Expected Discharge Plan and Services In-house Referral: Clinical Social Work Discharge Planning Services: NA Post Acute Care Choice: Skilled Nursing Facility Living arrangements for the past 2 months: Single Family Home                                      Prior Living Arrangements/Services Living arrangements for the past 2 months: Single Family Home Lives with:: Spouse Patient language and need for interpreter reviewed:: Yes Do you feel safe going back to the place where you live?: Yes      Need for Family Participation in Patient Care: No (Comment) Care giver support system in place?: No (comment)   Criminal Activity/Legal Involvement Pertinent to Current  Situation/Hospitalization: No - Comment as needed  Activities of Daily Living Home Assistive Devices/Equipment: None ADL Screening (condition at time of admission) Patient's cognitive ability adequate to safely complete daily activities?: Yes Is the patient deaf or have difficulty hearing?: No Does the patient have difficulty seeing, even when wearing glasses/contacts?: No Does the patient have difficulty concentrating, remembering, or making decisions?: Yes Patient able to express need for assistance with ADLs?: Yes Does the patient have difficulty dressing or bathing?: Yes Independently performs ADLs?: No Communication: Independent Dressing (OT): Needs assistance Is this a change from baseline?: Change from baseline, expected to last <3days Grooming: Independent Feeding: Independent Bathing: Needs assistance Is this a change from baseline?: Change from baseline, expected to last <3 days Toileting: Needs assistance Is this a change from baseline?: Change from baseline, expected to last <3 days In/Out Bed: Needs assistance Is this a change from baseline?: Change from baseline, expected to last <3 days Walks in Home: Needs assistance Is this a change from baseline?: Change from baseline, expected to last <3 days Does the patient have difficulty walking or climbing stairs?: Yes Weakness of Legs: Both Weakness of Arms/Hands: None  Permission Sought/Granted Permission sought to share information with : Family Supports, Oceanographer granted to share information with : Yes, Verbal Permission Granted  Share Information with NAME: Najib Locastro  Permission granted to share info  w AGENCY: SNF's  Permission granted to share info w Relationship: Spouse  Permission granted to share info w Contact Information: [phone]  Emotional Assessment Appearance:: Appears stated age Attitude/Demeanor/Rapport: Engaged Affect (typically observed):  Appropriate Orientation: : Oriented to Self, Oriented to Place, Oriented to Situation, Oriented to  Time   Psych Involvement: No (comment)  Admission diagnosis:  Pleural effusion [J90] Sepsis, due to unspecified organism, unspecified whether acute organ dysfunction present (HCC) [A41.9] COVID [U07.1] Pneumonia due to COVID-19 virus [U07.1, J12.82] Patient Active Problem List   Diagnosis Date Noted   Acute metabolic encephalopathy 08/22/2022   Acute on chronic diastolic CHF (congestive heart failure) (HCC) 08/22/2022   Demand ischemia 08/22/2022   COVID-19 08/21/2022   TIA (transient ischemic attack) 06/22/2016   Type 2 diabetes mellitus without complication (HCC) 06/22/2016   Primary osteoarthritis of both hands 06/03/2016   Primary osteoarthritis of both knees 06/03/2016   Generalized pain 06/03/2016   Memory difficulties 06/03/2016   History of diabetes mellitus 06/03/2016   History of coronary artery disease 06/03/2016   History of hypertension 06/03/2016   History of hypercholesterolemia 06/03/2016   High risk medication use 06/03/2016   History of high cholesterol 06/03/2016   ANA positive 06/03/2016   Inflammatory arthritis 10/29/2015   Vitamin D deficiency 10/29/2015   Fatigue 09/24/2015   MGUS (monoclonal gammopathy of unknown significance) 09/22/2015   Vertigo 10/31/2013   Chronic cough 11/16/2012   CAD (coronary artery disease), native coronary artery 03/08/2011   Postsurgical percutaneous transluminal coronary angioplasty (PTCA) status 03/08/2011   Type II or unspecified type diabetes mellitus with other coma, not stated as uncontrolled 03/08/2011   HTN (hypertension) 03/08/2011   Shortness of breath 03/08/2011   Mixed hyperlipidemia 03/08/2011   PCP:  Henrine Screws, MD Pharmacy:   [facility] [phone] - [place], [place] - [address] [place] [address] Phone: [phone] Fax: [phone]     Social  Determinants of Health (SDOH) Social History: SDOH Screenings   Food Insecurity: No Food Insecurity (08/21/2022)  Housing: Low Risk  (08/21/2022)  Transportation Needs: No Transportation Needs (08/21/2022)  Utilities: Not At Risk (08/21/2022)  Tobacco Use: Low Risk  (08/21/2022)   SDOH Interventions:     Readmission Risk Interventions    08/23/2022    1:16 PM  Readmission Risk Prevention Plan  Post Dischage Appt Complete  Medication Screening Complete  Transportation Screening Complete

## 2022-08-26 DIAGNOSIS — G9341 Metabolic encephalopathy: Secondary | ICD-10-CM | POA: Diagnosis not present

## 2022-08-26 LAB — CULTURE, BLOOD (ROUTINE X 2): Special Requests: ADEQUATE

## 2022-08-26 LAB — BASIC METABOLIC PANEL
Anion gap: 10 (ref 5–15)
BUN: 21 mg/dL (ref 8–23)
CO2: 27 mmol/L (ref 22–32)
Calcium: 8.9 mg/dL (ref 8.9–10.3)
Chloride: 102 mmol/L (ref 98–111)
Creatinine, Ser: 0.7 mg/dL (ref 0.61–1.24)
GFR, Estimated: >60 mL/min (ref >60)
Glucose, Bld: 132 mg/dL — ABNORMAL HIGH (ref 70–99)
Potassium: 3.8 mmol/L (ref 3.5–5.1)
Sodium: 139 mmol/L (ref 135–145)

## 2022-08-26 LAB — CBC WITH DIFFERENTIAL/PLATELET
Abs Immature Granulocytes: 0.06 10*3/uL (ref 0.00–0.07)
Basophils Absolute: 0.1 10*3/uL (ref 0.0–0.1)
Basophils Relative: 1 %
Eosinophils Absolute: 0.2 10*3/uL (ref 0.0–0.5)
Eosinophils Relative: 2 %
HCT: 55.4 % — ABNORMAL HIGH (ref 39.0–52.0)
Hemoglobin: 17.7 g/dL — ABNORMAL HIGH (ref 13.0–17.0)
Immature Granulocytes: 1 %
Lymphocytes Relative: 30 %
Lymphs Abs: 2.7 10*3/uL (ref 0.7–4.0)
MCH: 28.1 pg (ref 26.0–34.0)
MCHC: 31.9 g/dL (ref 30.0–36.0)
MCV: 88.1 fL (ref 80.0–100.0)
Monocytes Absolute: 1.1 10*3/uL — ABNORMAL HIGH (ref 0.1–1.0)
Monocytes Relative: 12 %
Neutro Abs: 4.8 10*3/uL (ref 1.7–7.7)
Neutrophils Relative %: 54 %
Platelets: 208 10*3/uL (ref 150–400)
RBC: 6.29 MIL/uL — ABNORMAL HIGH (ref 4.22–5.81)
RDW: 13.6 % (ref 11.5–15.5)
WBC: 8.9 10*3/uL (ref 4.0–10.5)
nRBC: 0 % (ref 0.0–0.2)

## 2022-08-26 LAB — GLUCOSE, CAPILLARY
Glucose-Capillary: 134 mg/dL — ABNORMAL HIGH (ref 70–99)
Glucose-Capillary: 191 mg/dL — ABNORMAL HIGH (ref 70–99)
Glucose-Capillary: 169 mg/dL — ABNORMAL HIGH (ref 70–99)
Glucose-Capillary: 241 mg/dL — ABNORMAL HIGH (ref 70–99)

## 2022-08-26 NOTE — Progress Notes (Signed)
PROGRESS NOTE    Alan Wang  QMV:[id number] DOB: 27-Jun-1950 DOA: 08/21/2022 PCP: Henrine Screws, MD     Brief Narrative:  Alan Wang is a 72 y.o. male with medical history significant of CAD, type 2 diabetes, hyperlipidemia, hypertension, prior TIA who presents emergency department with altered mental status.  Patient reports that he had a trip in [place] and has been having symptoms for the last 3 weeks.  He has been having worsening confusion.  His wife reported that he was acting strangely and brought him to the ER for further assessment. Respiratory viral panel was positive for COVID. Patient was admitted due to possible COVID encephalopathy.    New events last 24 hours / Subjective: Denies any new complaints, just reports generalized weakness.  Patient reports wife is still sick from COVID infection and is also weak.  Today, patient agreed to home health therapy, but would like for his wife to be much better from a COVID standpoint before returning home with home health.    Assessment & Plan:   Principal Problem:   Acute metabolic encephalopathy Active Problems:   HTN (hypertension)   Mixed hyperlipidemia   Type 2 diabetes mellitus without complication (HCC)   COVID-19   Acute on chronic diastolic CHF (congestive heart failure) (HCC)   Demand ischemia   Acute metabolic encephalopathy likely secondary to COVID MRI brain without any acute abnormalities Improving  COVID-19 Currently stable on room air Having symptoms over 3 weeks Completed rocephin in setting of continued cough, fever with acute CHF CRP elevated 11.1 --> 5.7-->3.4  Acute on chronic systolic diastolic heart failure BNP 1034.1 CTA chest negative for PE, moderate bilateral pleural effusion and compressive atelectasis Echocardiogram EF 25 to 30% with global hypokinesis Cardiology consulted, appreciate recs, further ischemic w/u as outpatient Lasix, entresto, jardiance, coreg Daily weight, strict I's and  O's, fluid restriction diet  Diabetes mellitus type 2 Semglee, sliding scale insulin   Demand ischemia in setting of above Troponin trend remains flat 57, 58  Hypertension Coreg     DVT prophylaxis:  enoxaparin (LOVENOX) injection 40 mg Start: 08/21/22 2200 SCDs Start: 08/21/22 2047  Code Status: Full code Family Communication: None at bedside Disposition Plan: Home health Status is: Inpatient Remains inpatient appropriate because: Level of care    Antimicrobials:  Anti-infectives (From admission, onward)    Start     Dose/Rate Route Frequency Ordered Stop   08/22/22 1400  cefTRIAXone (ROCEPHIN) 1 g in sodium chloride 0.9 % 100 mL IVPB  Status:  Discontinued        1 g 200 mL/hr over 30 Minutes Intravenous Every 24 hours 08/22/22 1254 08/26/22 1215   08/21/22 1445  cefTRIAXone (ROCEPHIN) 2 g in sodium chloride 0.9 % 100 mL IVPB  Status:  Discontinued        2 g 200 mL/hr over 30 Minutes Intravenous Every 24 hours 08/21/22 1430 08/21/22 2332   08/21/22 1445  azithromycin (ZITHROMAX) 500 mg in sodium chloride 0.9 % 250 mL IVPB  Status:  Discontinued        500 mg 250 mL/hr over 60 Minutes Intravenous Every 24 hours 08/21/22 1430 08/21/22 2332        Objective: Vitals:   08/25/22 1937 08/26/22 0519 08/26/22 0751 08/26/22 1151  BP: 139/78 (!) 143/90  121/73  Pulse: 92 93  94  Resp: 16 16  17   Temp: 99.3 F (37.4 C) 98.7 F (37.1 C)  99.3 F (37.4 C)  TempSrc: Oral Oral  Oral  SpO2: 92% 95%  94%  Weight:   96.7 kg   Height:        Intake/Output Summary (Last 24 hours) at 08/26/2022 1454 Last data filed at 08/26/2022 1307 Gross per 24 hour  Intake 955 ml  Output 2400 ml  Net -1445 ml   Filed Weights   08/24/22 0442 08/25/22 0500 08/26/22 0751  Weight: 95.1 kg 95.8 kg 96.7 kg    Examination:  General: NAD  Cardiovascular: S1, S2 present Respiratory: CTAB Abdomen: Soft, nontender, nondistended, bowel sounds present Musculoskeletal: No bilateral pedal  edema noted Skin: Normal Psychiatry: Normal mood    Data Reviewed: I have personally reviewed following labs and imaging studies  CBC: Recent Labs  Lab 08/21/22 1426 08/21/22 1451 08/21/22 2107 08/23/22 0505 08/24/22 0542 08/25/22 0619 08/26/22 0546  WBC 9.7  --  9.8 7.0 6.6 6.9 8.9  NEUTROABS 6.8  --   --   --   --   --  4.8  HGB 15.4   < > 15.0 16.5 15.4 18.0* 17.7*  HCT 47.6   < > 46.7 50.2 48.0 56.4* 55.4*  MCV 87.8  --  90.2 88.5 88.1 90.1 88.1  PLT 192  --  170 180 180 181 208   < > = values in this interval not displayed.   Basic Metabolic Panel: Recent Labs  Lab 08/22/22 0549 08/23/22 0505 08/24/22 0542 08/25/22 0619 08/26/22 0546  NA 136 137 136 139 139  K 3.4* 3.6 3.6 4.1 3.8  CL 99 103 103 102 102  CO2 22 23 26 27 27   GLUCOSE 140* 196* 177* 141* 132*  BUN 12 19 20 20 21   CREATININE 0.78 0.75 0.51* 0.90 0.70  CALCIUM 8.2* 8.6* 8.6* 9.2 8.9  MG 1.8  --  1.9 2.1  --    GFR: Estimated Creatinine Clearance: 96 mL/min (by C-G formula based on SCr of 0.7 mg/dL). Liver Function Tests: Recent Labs  Lab 08/21/22 1426 08/23/22 0505 08/24/22 0542  AST 15 20 21   ALT 11 15 16   ALKPHOS 72 59 60  BILITOT 0.9 0.9 0.8  PROT 7.0 6.7 6.4*  ALBUMIN 4.2 3.2* 3.2*   No results for input(s): "LIPASE", "AMYLASE" in the last 168 hours. No results for input(s): "AMMONIA" in the last 168 hours. Coagulation Profile: Recent Labs  Lab 08/21/22 1430  INR 1.1   Cardiac Enzymes: No results for input(s): "CKTOTAL", "CKMB", "CKMBINDEX", "TROPONINI" in the last 168 hours. BNP (last 3 results) No results for input(s): "PROBNP" in the last 8760 hours. HbA1C: No results for input(s): "HGBA1C" in the last 72 hours. CBG: Recent Labs  Lab 08/25/22 1152 08/25/22 1625 08/25/22 2038 08/26/22 0728 08/26/22 1148  GLUCAP 251* 133* 189* 134* 191*   Lipid Profile: No results for input(s): "CHOL", "HDL", "LDLCALC", "TRIG", "CHOLHDL", "LDLDIRECT" in the last 72  hours. Thyroid Function Tests: No results for input(s): "TSH", "T4TOTAL", "FREET4", "T3FREE", "THYROIDAB" in the last 72 hours. Anemia Panel: No results for input(s): "VITAMINB12", "FOLATE", "FERRITIN", "TIBC", "IRON", "RETICCTPCT" in the last 72 hours. Sepsis Labs: Recent Labs  Lab 08/21/22 1408 08/21/22 2107  LATICACIDVEN 1.2 1.2    Recent Results (from the past 240 hour(s))  Resp panel by RT-PCR (RSV, Flu A&B, Covid) Anterior Nasal Swab     Status: Abnormal   Collection Time: 08/21/22  2:30 PM   Specimen: Anterior Nasal Swab  Result Value Ref Range Status   SARS Coronavirus 2 by RT PCR POSITIVE (A) NEGATIVE Final  Comment: (NOTE) SARS-CoV-2 target nucleic acids are DETECTED.  The SARS-CoV-2 RNA is generally detectable in upper respiratory specimens during the acute phase of infection. Positive results are indicative of the presence of the identified virus, but do not rule out bacterial infection or co-infection with other pathogens not detected by the test. Clinical correlation with patient history and other diagnostic information is necessary to determine patient infection status. The expected result is Negative.  Fact Sheet for Patients: BloggerCourse.com  Fact Sheet for Healthcare Providers: SeriousBroker.it  This test is not yet approved or cleared by the [place] FDA and  has been authorized for detection and/or diagnosis of SARS-CoV-2 by FDA under an Emergency Use Authorization (EUA).  This EUA will remain in effect (meaning this test can be used) for the duration of  the COVID-19 declaration under Section 564(b)(1) of the A ct, 21 U.S.C. section 360bbb-3(b)(1), unless the authorization is terminated or revoked sooner.     Influenza A by PCR NEGATIVE NEGATIVE Final   Influenza B by PCR NEGATIVE NEGATIVE Final    Comment: (NOTE) The Xpert Xpress SARS-CoV-2/FLU/RSV plus assay is intended as an aid in the  diagnosis of influenza from Nasopharyngeal swab specimens and should not be used as a sole basis for treatment. Nasal washings and aspirates are unacceptable for Xpert Xpress SARS-CoV-2/FLU/RSV testing.  Fact Sheet for Patients: BloggerCourse.com  Fact Sheet for Healthcare Providers: SeriousBroker.it  This test is not yet approved or cleared by the [place] FDA and has been authorized for detection and/or diagnosis of SARS-CoV-2 by FDA under an Emergency Use Authorization (EUA). This EUA will remain in effect (meaning this test can be used) for the duration of the COVID-19 declaration under Section 564(b)(1) of the Act, 21 U.S.C. section 360bbb-3(b)(1), unless the authorization is terminated or revoked.     Resp Syncytial Virus by PCR NEGATIVE NEGATIVE Final    Comment: (NOTE) Fact Sheet for Patients: BloggerCourse.com  Fact Sheet for Healthcare Providers: SeriousBroker.it  This test is not yet approved or cleared by the [place] FDA and has been authorized for detection and/or diagnosis of SARS-CoV-2 by FDA under an Emergency Use Authorization (EUA). This EUA will remain in effect (meaning this test can be used) for the duration of the COVID-19 declaration under Section 564(b)(1) of the Act, 21 U.S.C. section 360bbb-3(b)(1), unless the authorization is terminated or revoked.  Performed at [facility], [address], [place], [place] [address]   Blood Culture (routine x 2)     Status: None   Collection Time: 08/21/22  2:56 PM   Specimen: BLOOD  Result Value Ref Range Status   Specimen Description   Final    BLOOD BLOOD RIGHT HAND Performed at [facility], [address] [place], [place] [address]    Special Requests   Final    BOTTLES DRAWN AEROBIC AND ANAEROBIC Blood Culture adequate volume Performed at [facility], [address], [place], [place] [address]    Culture   Final    NO GROWTH 5 DAYS Performed at [facility], [address], [place], [place] [address]    Report Status 08/26/2022 FINAL  Final  Blood Culture (routine x 2)     Status: None   Collection Time: 08/21/22  2:58 PM   Specimen: BLOOD  Result Value Ref Range Status   Specimen Description   Final    BLOOD BLOOD RIGHT ARM Performed at [facility], [address], [place], [place]  [address]    Special Requests   Final    BOTTLES DRAWN AEROBIC AND ANAEROBIC Blood Culture adequate volume Performed at [facility], [address] [place], [place] [address]    Culture   Final    NO GROWTH 5 DAYS Performed at [facility], [address], [place], [place] [address]    Report Status 08/26/2022 FINAL  Final      Radiology Studies: No results found.    Scheduled Meds:  aspirin EC  81 mg Oral QHS   carvedilol  6.25 mg Oral BID WC   empagliflozin  10 mg Oral Daily   enoxaparin (LOVENOX) injection  40 mg Subcutaneous Q24H   furosemide  40 mg Oral Daily   insulin aspart  0-9 Units Subcutaneous TID WC   insulin aspart  3 Units Subcutaneous TID WC   insulin glargine-yfgn  20 Units Subcutaneous QHS   sacubitril-valsartan  1 tablet Oral BID   Continuous Infusions:     LOS: 5 days     Briant Cedar, MD [facility] 08/26/2022, 2:54 PM   Available via Epic secure chat 7am-7pm After these hours, please refer to coverage provider listed on [email or web address]

## 2022-08-26 NOTE — TOC Progression Note (Signed)
Transition of Care [facility]) - Progression Note    Patient Details  Name: Alan Wang MRN: [id number] Date of Birth: [birth year]  [facility] of [facility]) CM/SW Contact  Otelia Santee, LCSW Phone Number: 08/26/2022, 1:24 PM  Clinical Narrative:    Spoke with pt's wife who shares that the RW they have at home was hers and no longer works correctly.  Pt agreeable to have RW ordered. CSW re-discussed recommendation for [facility]. Pt agreeable to HHPT.  RW has been ordered through [employer] who will deliver RW prior to DC. HHPT has been arranged with [facility].    Expected Discharge Plan: Home/Self Care Barriers to Discharge: Continued Medical Work up  Expected Discharge Plan and Services In-house Referral: Clinical Social Work Discharge Planning Services: NA Post Acute Care Choice: Skilled Nursing Facility Living arrangements for the past 2 months: Single Family Home                 DME Arranged: Walker rolling DME Agency: [employer] Date DME Agency Contacted: 08/26/22 Time DME Agency Contacted: 1324 Representative spoke with at DME Agency: Vaughan Basta HH Arranged: PT HH Agency: [facility] Date [facility] Agency Contacted: 08/26/22 Time HH Agency Contacted: 1324 Representative spoke with at [facility] Agency: Marylene Land   Social Determinants of Health (SDOH) Interventions SDOH Screenings   Food Insecurity: No Food Insecurity (08/21/2022)  Housing: Low Risk  (08/21/2022)  Transportation Needs: No Transportation Needs (08/21/2022)  Utilities: Not At Risk (08/21/2022)  Tobacco Use: Low Risk  (08/21/2022)    Readmission Risk Interventions    08/23/2022    1:16 PM  Readmission Risk Prevention Plan  Post Dischage Appt Complete  Medication Screening Complete  Transportation Screening Complete

## 2022-08-26 NOTE — Care Management Important Message (Signed)
Important Message  Patient Details IM Letter given. Name: Alan Wang MRN: [id number] Date of Birth: 08/24/1950   Medicare Important Message Given:  Yes     Caren Macadam 08/26/2022, 10:15 AM

## 2022-08-27 DIAGNOSIS — G9341 Metabolic encephalopathy: Secondary | ICD-10-CM | POA: Diagnosis not present

## 2022-08-27 LAB — CBC WITH DIFFERENTIAL/PLATELET
Abs Immature Granulocytes: 0.1 10*3/uL — ABNORMAL HIGH (ref 0.00–0.07)
Basophils Absolute: 0.1 10*3/uL (ref 0.0–0.1)
Basophils Relative: 1 %
Eosinophils Absolute: 0.2 10*3/uL (ref 0.0–0.5)
Eosinophils Relative: 2 %
HCT: 54.8 % — ABNORMAL HIGH (ref 39.0–52.0)
Hemoglobin: 17.4 g/dL — ABNORMAL HIGH (ref 13.0–17.0)
Immature Granulocytes: 1 %
Lymphocytes Relative: 26 %
Lymphs Abs: 2.7 10*3/uL (ref 0.7–4.0)
MCH: 28.1 pg (ref 26.0–34.0)
MCHC: 31.8 g/dL (ref 30.0–36.0)
MCV: 88.4 fL (ref 80.0–100.0)
Monocytes Absolute: 1.3 10*3/uL — ABNORMAL HIGH (ref 0.1–1.0)
Monocytes Relative: 13 %
Neutro Abs: 6 10*3/uL (ref 1.7–7.7)
Neutrophils Relative %: 57 %
Platelets: 228 10*3/uL (ref 150–400)
RBC: 6.2 MIL/uL — ABNORMAL HIGH (ref 4.22–5.81)
RDW: 13.6 % (ref 11.5–15.5)
WBC: 10.4 10*3/uL (ref 4.0–10.5)
nRBC: 0 % (ref 0.0–0.2)

## 2022-08-27 LAB — GLUCOSE, CAPILLARY
Glucose-Capillary: 177 mg/dL — ABNORMAL HIGH (ref 70–99)
Glucose-Capillary: 231 mg/dL — ABNORMAL HIGH (ref 70–99)
Glucose-Capillary: 224 mg/dL — ABNORMAL HIGH (ref 70–99)
Glucose-Capillary: 194 mg/dL — ABNORMAL HIGH (ref 70–99)

## 2022-08-27 MED ORDER — ORAL CARE MOUTH RINSE: 15.0000 mL | OROMUCOSAL | Status: DC | PRN

## 2022-08-27 NOTE — Progress Notes (Signed)
Physical Therapy Treatment Patient Details Name: Alan Wang MRN: [id number] DOB: 1951/01/18 Today's Date: 08/27/2022   History of Present Illness 72 yo male admitted with acute encephalopathy, COVID, fall. Hx of DM, TIA, CAD, COVID, OA    PT Comments    Pt ambulated in hallway without assistive device or symptoms.  Pt anticipates d/c home soon (pending his wife's doctor appointment).       Recommendations for follow up therapy are one component of a multi-disciplinary discharge planning process, led by the attending physician.  Recommendations may be updated based on patient status, additional functional criteria and insurance authorization.  Follow Up Recommendations       Assistance Recommended at Discharge Intermittent Supervision/Assistance  Patient can return home with the following A little help with walking and/or transfers;A little help with bathing/dressing/bathroom;Assistance with cooking/housework;Assist for transportation;Help with stairs or ramp for entrance   Equipment Recommendations  None recommended by PT    Recommendations for Other Services       Precautions / Restrictions Precautions Precautions: Fall     Mobility  Bed Mobility Overal bed mobility: Modified Independent                  Transfers Overall transfer level: Needs assistance Equipment used: None Transfers: Sit to/from Stand Sit to Stand: Supervision                Ambulation/Gait Ambulation/Gait assistance: Min guard, Supervision Gait Distance (Feet): 600 Feet Assistive device: None Gait Pattern/deviations: Step-through pattern, Decreased stride length       General Gait Details: tends to graze objects nearby lightly with hand but no assistive device required, denies any symptoms   Stairs             Wheelchair Mobility    Modified Rankin (Stroke Patients Only)       Balance Overall balance assessment: Needs assistance         Standing balance  support: During functional activity Standing balance-Leahy Scale: Fair                              Cognition Arousal/Alertness: Awake/alert Behavior During Therapy: WFL for tasks assessed/performed Overall Cognitive Status: Within Functional Limits for tasks assessed                                          Exercises      General Comments        Pertinent Vitals/Pain Pain Assessment Pain Assessment: No/denies pain    Home Living                          Prior Function            PT Goals (current goals can now be found in the care plan section) Progress towards PT goals: Progressing toward goals    Frequency    Min 1X/week      PT Plan Current plan remains appropriate    Co-evaluation              AM-PAC PT "6 Clicks" Mobility   Outcome Measure  Help needed turning from your back to your side while in a flat bed without using bedrails?: None Help needed moving from lying on your back to sitting on the side of a flat bed  without using bedrails?: None Help needed moving to and from a bed to a chair (including a wheelchair)?: None Help needed standing up from a chair using your arms (e.g., wheelchair or bedside chair)?: None Help needed to walk in hospital room?: A Little Help needed climbing 3-5 steps with a railing? : A Little 6 Click Score: 22    End of Session Equipment Utilized During Treatment: Gait belt Activity Tolerance: Patient tolerated treatment well Patient left: in bed;with call bell/phone within reach;with bed alarm set Nurse Communication: Mobility status PT Visit Diagnosis: Difficulty in walking, not elsewhere classified (R26.2)     Time: 1027-2536 PT Time Calculation (min) (ACUTE ONLY): 32 min  Charges:  $Gait Training: 8-22 mins                     Paulino Door, DPT Physical Therapist Acute Rehabilitation Services Office: [phone]    Janan Halter [place] 08/27/2022, 4:57 PM

## 2022-08-27 NOTE — Progress Notes (Signed)
PROGRESS NOTE    Alan Wang  AOZ:[id number] DOB: 1950-08-16 DOA: 08/21/2022 PCP: Henrine Screws, MD     Brief Narrative:  Alan Wang is a 72 y.o. male with medical history significant of CAD, type 2 diabetes, hyperlipidemia, hypertension, prior TIA who presents emergency department with altered mental status.  Patient reports that he had a trip in [place] and has been having symptoms for the last 3 weeks.  He has been having worsening confusion.  His wife reported that he was acting strangely and brought him to the ER for further assessment. Respiratory viral panel was positive for COVID. Patient was admitted due to possible COVID encephalopathy.    New events last 24 hours / Subjective: Denies any new complaints.  Assessment & Plan:   Principal Problem:   Acute metabolic encephalopathy Active Problems:   HTN (hypertension)   Mixed hyperlipidemia   Type 2 diabetes mellitus without complication (HCC)   COVID-19   Acute on chronic diastolic CHF (congestive heart failure) (HCC)   Demand ischemia   Acute metabolic encephalopathy likely secondary to COVID MRI brain without any acute abnormalities Improving  COVID-19 Currently stable on room air Having symptoms over 3 weeks Completed rocephin in setting of continued cough, fever with acute CHF CRP elevated 11.1 --> 5.7-->3.4  Acute on chronic systolic diastolic heart failure BNP 1034.1 CTA chest negative for PE, moderate bilateral pleural effusion and compressive atelectasis Echocardiogram EF 25 to 30% with global hypokinesis Cardiology consulted, appreciate recs, further ischemic w/u as outpatient Lasix, entresto, jardiance, coreg Daily weight, strict I's and O's, fluid restriction diet  Diabetes mellitus type 2 Semglee, sliding scale insulin   Demand ischemia in setting of above Troponin trend remains flat 57, 58  Hypertension Coreg     DVT prophylaxis:  enoxaparin (LOVENOX) injection 40 mg Start: 08/21/22  2200 SCDs Start: 08/21/22 2047  Code Status: Full code Family Communication: Discussed with daughter over the phone on 6/21 Disposition Plan: Home health Status is: Inpatient Remains inpatient appropriate because: Level of care    Antimicrobials:  Anti-infectives (From admission, onward)    Start     Dose/Rate Route Frequency Ordered Stop   08/22/22 1400  cefTRIAXone (ROCEPHIN) 1 g in sodium chloride 0.9 % 100 mL IVPB  Status:  Discontinued        1 g 200 mL/hr over 30 Minutes Intravenous Every 24 hours 08/22/22 1254 08/26/22 1215   08/21/22 1445  cefTRIAXone (ROCEPHIN) 2 g in sodium chloride 0.9 % 100 mL IVPB  Status:  Discontinued        2 g 200 mL/hr over 30 Minutes Intravenous Every 24 hours 08/21/22 1430 08/21/22 2332   08/21/22 1445  azithromycin (ZITHROMAX) 500 mg in sodium chloride 0.9 % 250 mL IVPB  Status:  Discontinued        500 mg 250 mL/hr over 60 Minutes Intravenous Every 24 hours 08/21/22 1430 08/21/22 2332        Objective: Vitals:   08/26/22 1151 08/26/22 2023 08/27/22 0501 08/27/22 1325  BP: 121/73 99/78 99/80  121/79  Pulse: 94 89 92 91  Resp: 17 20 18 18   Temp: 99.3 F (37.4 C) 98.8 F (37.1 C) 99 F (37.2 C) 98.2 F (36.8 C)  TempSrc: Oral Oral Oral Oral  SpO2: 94% 93% 98% 95%  Weight:   86.1 kg   Height:        Intake/Output Summary (Last 24 hours) at 08/27/2022 1832 Last data filed at 08/27/2022 1530 Gross per 24  hour  Intake 600 ml  Output 2300 ml  Net -1700 ml   Filed Weights   08/25/22 0500 08/26/22 0751 08/27/22 0501  Weight: 95.8 kg 96.7 kg 86.1 kg    Examination:  General: NAD  Cardiovascular: S1, S2 present Respiratory: CTAB Abdomen: Soft, nontender, nondistended, bowel sounds present Musculoskeletal: No bilateral pedal edema noted Skin: Normal Psychiatry: Normal mood    Data Reviewed: I have personally reviewed following labs and imaging studies  CBC: Recent Labs  Lab 08/21/22 1426 08/21/22 1451 08/23/22 0505  08/24/22 0542 08/25/22 0619 08/26/22 0546 08/27/22 0559  WBC 9.7   < > 7.0 6.6 6.9 8.9 10.4  NEUTROABS 6.8  --   --   --   --  4.8 6.0  HGB 15.4   < > 16.5 15.4 18.0* 17.7* 17.4*  HCT 47.6   < > 50.2 48.0 56.4* 55.4* 54.8*  MCV 87.8   < > 88.5 88.1 90.1 88.1 88.4  PLT 192   < > 180 180 181 208 228   < > = values in this interval not displayed.   Basic Metabolic Panel: Recent Labs  Lab 08/22/22 0549 08/23/22 0505 08/24/22 0542 08/25/22 0619 08/26/22 0546  NA 136 137 136 139 139  K 3.4* 3.6 3.6 4.1 3.8  CL 99 103 103 102 102  CO2 22 23 26 27 27   GLUCOSE 140* 196* 177* 141* 132*  BUN 12 19 20 20 21   CREATININE 0.78 0.75 0.51* 0.90 0.70  CALCIUM 8.2* 8.6* 8.6* 9.2 8.9  MG 1.8  --  1.9 2.1  --    GFR: Estimated Creatinine Clearance: 84.9 mL/min (by C-G formula based on SCr of 0.7 mg/dL). Liver Function Tests: Recent Labs  Lab 08/21/22 1426 08/23/22 0505 08/24/22 0542  AST 15 20 21   ALT 11 15 16   ALKPHOS 72 59 60  BILITOT 0.9 0.9 0.8  PROT 7.0 6.7 6.4*  ALBUMIN 4.2 3.2* 3.2*   No results for input(s): "LIPASE", "AMYLASE" in the last 168 hours. No results for input(s): "AMMONIA" in the last 168 hours. Coagulation Profile: Recent Labs  Lab 08/21/22 1430  INR 1.1   Cardiac Enzymes: No results for input(s): "CKTOTAL", "CKMB", "CKMBINDEX", "TROPONINI" in the last 168 hours. BNP (last 3 results) No results for input(s): "PROBNP" in the last 8760 hours. HbA1C: No results for input(s): "HGBA1C" in the last 72 hours. CBG: Recent Labs  Lab 08/26/22 1536 08/26/22 2125 08/27/22 0748 08/27/22 1223 08/27/22 1707  GLUCAP 169* 241* 177* 231* 224*   Lipid Profile: No results for input(s): "CHOL", "HDL", "LDLCALC", "TRIG", "CHOLHDL", "LDLDIRECT" in the last 72 hours. Thyroid Function Tests: No results for input(s): "TSH", "T4TOTAL", "FREET4", "T3FREE", "THYROIDAB" in the last 72 hours. Anemia Panel: No results for input(s): "VITAMINB12", "FOLATE", "FERRITIN",  "TIBC", "IRON", "RETICCTPCT" in the last 72 hours. Sepsis Labs: Recent Labs  Lab 08/21/22 1408 08/21/22 2107  LATICACIDVEN 1.2 1.2    Recent Results (from the past 240 hour(s))  Resp panel by RT-PCR (RSV, Flu A&B, Covid) Anterior Nasal Swab     Status: Abnormal   Collection Time: 08/21/22  2:30 PM   Specimen: Anterior Nasal Swab  Result Value Ref Range Status   SARS Coronavirus 2 by RT PCR POSITIVE (A) NEGATIVE Final    Comment: (NOTE) SARS-CoV-2 target nucleic acids are DETECTED.  The SARS-CoV-2 RNA is generally detectable in upper respiratory specimens during the acute phase of infection. Positive results are indicative of the presence of the identified virus, but  do not rule out bacterial infection or co-infection with other pathogens not detected by the test. Clinical correlation with patient history and other diagnostic information is necessary to determine patient infection status. The expected result is Negative.  Fact Sheet for Patients: BloggerCourse.com  Fact Sheet for Healthcare Providers: SeriousBroker.it  This test is not yet approved or cleared by the [place] FDA and  has been authorized for detection and/or diagnosis of SARS-CoV-2 by FDA under an Emergency Use Authorization (EUA).  This EUA will remain in effect (meaning this test can be used) for the duration of  the COVID-19 declaration under Section 564(b)(1) of the A ct, 21 U.S.C. section 360bbb-3(b)(1), unless the authorization is terminated or revoked sooner.     Influenza A by PCR NEGATIVE NEGATIVE Final   Influenza B by PCR NEGATIVE NEGATIVE Final    Comment: (NOTE) The Xpert Xpress SARS-CoV-2/FLU/RSV plus assay is intended as an aid in the diagnosis of influenza from Nasopharyngeal swab specimens and should not be used as a sole basis for treatment. Nasal washings and aspirates are unacceptable for Xpert Xpress  SARS-CoV-2/FLU/RSV testing.  Fact Sheet for Patients: BloggerCourse.com  Fact Sheet for Healthcare Providers: SeriousBroker.it  This test is not yet approved or cleared by the [place] FDA and has been authorized for detection and/or diagnosis of SARS-CoV-2 by FDA under an Emergency Use Authorization (EUA). This EUA will remain in effect (meaning this test can be used) for the duration of the COVID-19 declaration under Section 564(b)(1) of the Act, 21 U.S.C. section 360bbb-3(b)(1), unless the authorization is terminated or revoked.     Resp Syncytial Virus by PCR NEGATIVE NEGATIVE Final    Comment: (NOTE) Fact Sheet for Patients: BloggerCourse.com  Fact Sheet for Healthcare Providers: SeriousBroker.it  This test is not yet approved or cleared by the [place] FDA and has been authorized for detection and/or diagnosis of SARS-CoV-2 by FDA under an Emergency Use Authorization (EUA). This EUA will remain in effect (meaning this test can be used) for the duration of the COVID-19 declaration under Section 564(b)(1) of the Act, 21 U.S.C. section 360bbb-3(b)(1), unless the authorization is terminated or revoked.  Performed at [facility], [address], [place], [place] [address]   Blood Culture (routine x 2)     Status: None   Collection Time: 08/21/22  2:56 PM   Specimen: BLOOD  Result Value Ref Range Status   Specimen Description   Final    BLOOD BLOOD RIGHT HAND Performed at [facility], [address], [place], [place] [address]    Special Requests   Final    BOTTLES DRAWN AEROBIC AND ANAEROBIC Blood Culture adequate volume Performed at [facility], [address] [place], [place] [address]    Culture   Final    NO GROWTH 5 DAYS Performed at [facility], [address] [place], [place]  [address]    Report Status 08/26/2022 FINAL  Final  Blood Culture (routine x 2)     Status: None   Collection Time: 08/21/22  2:58 PM   Specimen: BLOOD  Result Value Ref Range Status   Specimen Description   Final    BLOOD BLOOD RIGHT ARM Performed at [facility], [address], [place], [place] [address]    Special Requests   Final    BOTTLES DRAWN AEROBIC AND ANAEROBIC Blood Culture adequate volume Performed at [facility], [address], [place], [place] [address]    Culture  Final    NO GROWTH 5 DAYS Performed at [facility], [address] [place], [place] [address]    Report Status 08/26/2022 FINAL  Final      Radiology Studies: No results found.    Scheduled Meds:  aspirin EC  81 mg Oral QHS   carvedilol  6.25 mg Oral BID WC   empagliflozin  10 mg Oral Daily   enoxaparin (LOVENOX) injection  40 mg Subcutaneous Q24H   furosemide  40 mg Oral Daily   insulin aspart  0-9 Units Subcutaneous TID WC   insulin aspart  3 Units Subcutaneous TID WC   insulin glargine-yfgn  20 Units Subcutaneous QHS   sacubitril-valsartan  1 tablet Oral BID   Continuous Infusions:     LOS: 6 days     Briant Cedar, MD [facility] 08/27/2022, 6:32 PM   Available via Epic secure chat 7am-7pm After these hours, please refer to coverage provider listed on [email or web address]

## 2022-08-28 DIAGNOSIS — G9341 Metabolic encephalopathy: Secondary | ICD-10-CM | POA: Diagnosis not present

## 2022-08-28 LAB — CBC WITH DIFFERENTIAL/PLATELET
Abs Immature Granulocytes: 0.15 10*3/uL — ABNORMAL HIGH (ref 0.00–0.07)
Basophils Absolute: 0.1 10*3/uL (ref 0.0–0.1)
Basophils Relative: 1 %
Eosinophils Absolute: 0.2 10*3/uL (ref 0.0–0.5)
Eosinophils Relative: 2 %
HCT: 56.8 % — ABNORMAL HIGH (ref 39.0–52.0)
Hemoglobin: 17.9 g/dL — ABNORMAL HIGH (ref 13.0–17.0)
Immature Granulocytes: 2 %
Lymphocytes Relative: 31 %
Lymphs Abs: 3.1 10*3/uL (ref 0.7–4.0)
MCH: 28.1 pg (ref 26.0–34.0)
MCHC: 31.5 g/dL (ref 30.0–36.0)
MCV: 89.3 fL (ref 80.0–100.0)
Monocytes Absolute: 1.2 10*3/uL — ABNORMAL HIGH (ref 0.1–1.0)
Monocytes Relative: 12 %
Neutro Abs: 5.2 10*3/uL (ref 1.7–7.7)
Neutrophils Relative %: 52 %
Platelets: 248 10*3/uL (ref 150–400)
RBC: 6.36 MIL/uL — ABNORMAL HIGH (ref 4.22–5.81)
RDW: 13.8 % (ref 11.5–15.5)
WBC: 10.1 10*3/uL (ref 4.0–10.5)
nRBC: 0 % (ref 0.0–0.2)

## 2022-08-28 LAB — BASIC METABOLIC PANEL
Anion gap: 9 (ref 5–15)
BUN: 25 mg/dL — ABNORMAL HIGH (ref 8–23)
CO2: 25 mmol/L (ref 22–32)
Calcium: 9.1 mg/dL (ref 8.9–10.3)
Chloride: 99 mmol/L (ref 98–111)
Creatinine, Ser: 0.82 mg/dL (ref 0.61–1.24)
GFR, Estimated: >60 mL/min (ref >60)
Glucose, Bld: 143 mg/dL — ABNORMAL HIGH (ref 70–99)
Potassium: 3.8 mmol/L (ref 3.5–5.1)
Sodium: 133 mmol/L — ABNORMAL LOW (ref 135–145)

## 2022-08-28 LAB — GLUCOSE, CAPILLARY
Glucose-Capillary: 146 mg/dL — ABNORMAL HIGH (ref 70–99)
Glucose-Capillary: 262 mg/dL — ABNORMAL HIGH (ref 70–99)
Glucose-Capillary: 177 mg/dL — ABNORMAL HIGH (ref 70–99)
Glucose-Capillary: 243 mg/dL — ABNORMAL HIGH (ref 70–99)

## 2022-08-28 MED ORDER — FUROSEMIDE 20 MG PO TABS: 20.0000 mg | ORAL_TABLET | Freq: Every day | ORAL | 0 refills | Status: DC

## 2022-08-28 MED ORDER — FUROSEMIDE 20 MG PO TABS
20.0000 mg | ORAL_TABLET | Freq: Every day | ORAL | Status: DC
  Administered 2022-08-28: 20 mg via ORAL
  Filled 2022-08-28: qty 1

## 2022-08-28 NOTE — Progress Notes (Signed)
PROGRESS NOTE    Alan Wang  ZDG:[id number] DOB: [birth year] DOA: 08/21/2022 PCP: Henrine Screws, MD     Brief Narrative:  Alan Wang is a 72 y.o. male with medical history significant of CAD, type 2 diabetes, hyperlipidemia, hypertension, prior TIA who presents emergency department with altered mental status.  Patient reports that he had a trip in [place] and has been having symptoms for the last 3 weeks.  He has been having worsening confusion.  His wife reported that he was acting strangely and brought him to the ER for further assessment. Respiratory viral panel was positive for COVID. Patient was admitted due to possible COVID encephalopathy.    New events last 24 hours / Subjective: Denies any new complaints.  Discussed with patient and daughter at bedside.    Assessment & Plan:   Principal Problem:   Acute metabolic encephalopathy Active Problems:   HTN (hypertension)   Mixed hyperlipidemia   Type 2 diabetes mellitus without complication (HCC)   COVID-19   Acute on chronic diastolic CHF (congestive heart failure) (HCC)   Demand ischemia   Acute metabolic encephalopathy likely secondary to COVID MRI brain without any acute abnormalities Improving  COVID-19 Currently stable on room air Having symptoms over 3 weeks Completed rocephin in setting of continued cough, fever with acute CHF CRP elevated 11.1 --> 5.7-->3.4  Acute on chronic systolic diastolic heart failure BNP 1034.1 CTA chest negative for PE, moderate bilateral pleural effusion and compressive atelectasis Echocardiogram EF 25 to 30% with global hypokinesis Cardiology consulted, appreciate recs, further ischemic w/u as outpatient Lasix, entresto, jardiance, coreg Daily weight, strict I's and O's, fluid restriction diet  Diabetes mellitus type 2 Semglee, sliding scale insulin   Demand ischemia in setting of above Troponin trend remains flat 57, 58  Hypertension Coreg     DVT prophylaxis:   enoxaparin (LOVENOX) injection 40 mg Start: 08/21/22 2200 SCDs Start: 08/21/22 2047  Code Status: Full code Family Communication: Discussed with daughter at bedside on 6/22 Disposition Plan: Home health Status is: Inpatient Remains inpatient appropriate because: Level of care    Antimicrobials:  Anti-infectives (From admission, onward)    Start     Dose/Rate Route Frequency Ordered Stop   08/22/22 1400  cefTRIAXone (ROCEPHIN) 1 g in sodium chloride 0.9 % 100 mL IVPB  Status:  Discontinued        1 g 200 mL/hr over 30 Minutes Intravenous Every 24 hours 08/22/22 1254 08/26/22 1215   08/21/22 1445  cefTRIAXone (ROCEPHIN) 2 g in sodium chloride 0.9 % 100 mL IVPB  Status:  Discontinued        2 g 200 mL/hr over 30 Minutes Intravenous Every 24 hours 08/21/22 1430 08/21/22 2332   08/21/22 1445  azithromycin (ZITHROMAX) 500 mg in sodium chloride 0.9 % 250 mL IVPB  Status:  Discontinued        500 mg 250 mL/hr over 60 Minutes Intravenous Every 24 hours 08/21/22 1430 08/21/22 2332        Objective: Vitals:   08/27/22 1325 08/27/22 2129 08/28/22 0549 08/28/22 1339  BP: 121/79 106/85 117/76 107/74  Pulse: 91 87 78 88  Resp: 18 16 16 16   Temp: 98.2 F (36.8 C) 99 F (37.2 C) 98.7 F (37.1 C) 98.5 F (36.9 C)  TempSrc: Oral Oral Oral Oral  SpO2: 95% 95% 96% 96%  Weight:      Height:        Intake/Output Summary (Last 24 hours) at 08/28/2022 1726 Last  data filed at 08/28/2022 0551 Gross per 24 hour  Intake --  Output 1000 ml  Net -1000 ml   Filed Weights   08/25/22 0500 08/26/22 0751 08/27/22 0501  Weight: 95.8 kg 96.7 kg 86.1 kg    Examination:  General: NAD  Cardiovascular: S1, S2 present Respiratory: CTAB Abdomen: Soft, nontender, nondistended, bowel sounds present Musculoskeletal: No bilateral pedal edema noted Skin: Normal Psychiatry: Normal mood    Data Reviewed: I have personally reviewed following labs and imaging studies  CBC: Recent Labs  Lab  08/24/22 0542 08/25/22 0619 08/26/22 0546 08/27/22 0559 08/28/22 0653  WBC 6.6 6.9 8.9 10.4 10.1  NEUTROABS  --   --  4.8 6.0 5.2  HGB 15.4 18.0* 17.7* 17.4* 17.9*  HCT 48.0 56.4* 55.4* 54.8* 56.8*  MCV 88.1 90.1 88.1 88.4 89.3  PLT 180 181 208 228 248   Basic Metabolic Panel: Recent Labs  Lab 08/22/22 0549 08/23/22 0505 08/24/22 0542 08/25/22 0619 08/26/22 0546 08/28/22 0653  NA 136 137 136 139 139 133*  K 3.4* 3.6 3.6 4.1 3.8 3.8  CL 99 103 103 102 102 99  CO2 22 23 26 27 27 25   GLUCOSE 140* 196* 177* 141* 132* 143*  BUN 12 19 20 20 21  25*  CREATININE 0.78 0.75 0.51* 0.90 0.70 0.82  CALCIUM 8.2* 8.6* 8.6* 9.2 8.9 9.1  MG 1.8  --  1.9 2.1  --   --    GFR: Estimated Creatinine Clearance: 82.8 mL/min (by C-G formula based on SCr of 0.82 mg/dL). Liver Function Tests: Recent Labs  Lab 08/23/22 0505 08/24/22 0542  AST 20 21  ALT 15 16  ALKPHOS 59 60  BILITOT 0.9 0.8  PROT 6.7 6.4*  ALBUMIN 3.2* 3.2*   No results for input(s): "LIPASE", "AMYLASE" in the last 168 hours. No results for input(s): "AMMONIA" in the last 168 hours. Coagulation Profile: No results for input(s): "INR", "PROTIME" in the last 168 hours.  Cardiac Enzymes: No results for input(s): "CKTOTAL", "CKMB", "CKMBINDEX", "TROPONINI" in the last 168 hours. BNP (last 3 results) No results for input(s): "PROBNP" in the last 8760 hours. HbA1C: No results for input(s): "HGBA1C" in the last 72 hours. CBG: Recent Labs  Lab 08/27/22 1707 08/27/22 2126 08/28/22 0721 08/28/22 1135 08/28/22 1659  GLUCAP 224* 194* 146* 262* 177*   Lipid Profile: No results for input(s): "CHOL", "HDL", "LDLCALC", "TRIG", "CHOLHDL", "LDLDIRECT" in the last 72 hours. Thyroid Function Tests: No results for input(s): "TSH", "T4TOTAL", "FREET4", "T3FREE", "THYROIDAB" in the last 72 hours. Anemia Panel: No results for input(s): "VITAMINB12", "FOLATE", "FERRITIN", "TIBC", "IRON", "RETICCTPCT" in the last 72 hours. Sepsis  Labs: Recent Labs  Lab 08/21/22 2107  LATICACIDVEN 1.2    Recent Results (from the past 240 hour(s))  Resp panel by RT-PCR (RSV, Flu A&B, Covid) Anterior Nasal Swab     Status: Abnormal   Collection Time: 08/21/22  2:30 PM   Specimen: Anterior Nasal Swab  Result Value Ref Range Status   SARS Coronavirus 2 by RT PCR POSITIVE (A) NEGATIVE Final    Comment: (NOTE) SARS-CoV-2 target nucleic acids are DETECTED.  The SARS-CoV-2 RNA is generally detectable in upper respiratory specimens during the acute phase of infection. Positive results are indicative of the presence of the identified virus, but do not rule out bacterial infection or co-infection with other pathogens not detected by the test. Clinical correlation with patient history and other diagnostic information is necessary to determine patient infection status. The expected result is  Negative.  Fact Sheet for Patients: BloggerCourse.com  Fact Sheet for Healthcare Providers: SeriousBroker.it  This test is not yet approved or cleared by the [place] FDA and  has been authorized for detection and/or diagnosis of SARS-CoV-2 by FDA under an Emergency Use Authorization (EUA).  This EUA will remain in effect (meaning this test can be used) for the duration of  the COVID-19 declaration under Section 564(b)(1) of the A ct, 21 U.S.C. section 360bbb-3(b)(1), unless the authorization is terminated or revoked sooner.     Influenza A by PCR NEGATIVE NEGATIVE Final   Influenza B by PCR NEGATIVE NEGATIVE Final    Comment: (NOTE) The Xpert Xpress SARS-CoV-2/FLU/RSV plus assay is intended as an aid in the diagnosis of influenza from Nasopharyngeal swab specimens and should not be used as a sole basis for treatment. Nasal washings and aspirates are unacceptable for Xpert Xpress SARS-CoV-2/FLU/RSV testing.  Fact Sheet for Patients: BloggerCourse.com  Fact  Sheet for Healthcare Providers: SeriousBroker.it  This test is not yet approved or cleared by the [place] FDA and has been authorized for detection and/or diagnosis of SARS-CoV-2 by FDA under an Emergency Use Authorization (EUA). This EUA will remain in effect (meaning this test can be used) for the duration of the COVID-19 declaration under Section 564(b)(1) of the Act, 21 U.S.C. section 360bbb-3(b)(1), unless the authorization is terminated or revoked.     Resp Syncytial Virus by PCR NEGATIVE NEGATIVE Final    Comment: (NOTE) Fact Sheet for Patients: BloggerCourse.com  Fact Sheet for Healthcare Providers: SeriousBroker.it  This test is not yet approved or cleared by the [place] FDA and has been authorized for detection and/or diagnosis of SARS-CoV-2 by FDA under an Emergency Use Authorization (EUA). This EUA will remain in effect (meaning this test can be used) for the duration of the COVID-19 declaration under Section 564(b)(1) of the Act, 21 U.S.C. section 360bbb-3(b)(1), unless the authorization is terminated or revoked.  Performed at [facility], [address], [place], [place] [address]   Blood Culture (routine x 2)     Status: None   Collection Time: 08/21/22  2:56 PM   Specimen: BLOOD  Result Value Ref Range Status   Specimen Description   Final    BLOOD BLOOD RIGHT HAND Performed at [facility], [address] [place], [place] [address]    Special Requests   Final    BOTTLES DRAWN AEROBIC AND ANAEROBIC Blood Culture adequate volume Performed at [facility], [address] [place], [place] [address]    Culture   Final    NO GROWTH 5 DAYS Performed at [facility], [address] [place], [place] [address]    Report Status 08/26/2022 FINAL  Final  Blood Culture (routine x 2)     Status: None   Collection  Time: 08/21/22  2:58 PM   Specimen: BLOOD  Result Value Ref Range Status   Specimen Description   Final    BLOOD BLOOD RIGHT ARM Performed at [facility], [address], [place], [place] [address]    Special Requests   Final    BOTTLES DRAWN AEROBIC AND ANAEROBIC Blood Culture adequate volume Performed at [facility], [address], [place], [place] [address]    Culture   Final    NO GROWTH 5 DAYS Performed at [facility], [address], [place], [place] [address]    Report Status 08/26/2022 FINAL  Final  Radiology Studies: No results found.    Scheduled Meds:  aspirin EC  81 mg Oral QHS   carvedilol  6.25 mg Oral BID WC   empagliflozin  10 mg Oral Daily   enoxaparin (LOVENOX) injection  40 mg Subcutaneous Q24H   furosemide  20 mg Oral Daily   insulin aspart  0-9 Units Subcutaneous TID WC   insulin aspart  3 Units Subcutaneous TID WC   insulin glargine-yfgn  20 Units Subcutaneous QHS   sacubitril-valsartan  1 tablet Oral BID   Continuous Infusions:     LOS: 7 days     Briant Cedar, MD [facility] 08/28/2022, 5:26 PM   Available via Epic secure chat 7am-7pm After these hours, please refer to coverage provider listed on [email or web address]

## 2022-08-28 NOTE — Progress Notes (Signed)
Mobility Specialist - Progress Note   08/28/22 1115  Mobility  Activity Ambulated with assistance in hallway  Level of Assistance Standby assist, set-up cues, supervision of patient - no hands on  Assistive Device Front wheel walker  Distance Ambulated (ft) 250 ft  Activity Response Tolerated well  Mobility Referral Yes  $Mobility charge 1 Mobility  Mobility Specialist Start Time (ACUTE ONLY) 1056  Mobility Specialist Stop Time (ACUTE ONLY) 1114  Mobility Specialist Time Calculation (min) (ACUTE ONLY) 18 min   Pt received in bed and agreeable to mobility. Pt was MinA from STS. No complaints during session. Pt to recliner after session with all needs met. Chair alarm on.    [facility]

## 2022-08-29 DIAGNOSIS — U071 COVID-19: Secondary | ICD-10-CM | POA: Diagnosis not present

## 2022-08-29 DIAGNOSIS — G9349 Other encephalopathy: Secondary | ICD-10-CM | POA: Diagnosis not present

## 2022-08-29 LAB — GLUCOSE, CAPILLARY
Glucose-Capillary: 170 mg/dL — ABNORMAL HIGH (ref 70–99)
Glucose-Capillary: 251 mg/dL — ABNORMAL HIGH (ref 70–99)

## 2022-08-29 MED ORDER — SACUBITRIL-VALSARTAN 24-26 MG PO TABS: 1.0000 | ORAL_TABLET | Freq: Two times a day (BID) | ORAL | 0 refills | Status: AC

## 2022-08-29 MED ORDER — EMPAGLIFLOZIN 10 MG PO TABS: 10.0000 mg | ORAL_TABLET | Freq: Every day | ORAL | 0 refills | Status: AC

## 2022-08-29 MED ORDER — ASPIRIN 81 MG PO TBEC: 81.0000 mg | DELAYED_RELEASE_TABLET | Freq: Every day | ORAL | 0 refills | Status: DC

## 2022-08-29 MED ORDER — CARVEDILOL 6.25 MG PO TABS
6.2500 mg | ORAL_TABLET | Freq: Two times a day (BID) | ORAL | 0 refills | Status: AC
Start: 2022-08-29 — End: 2022-11-09

## 2022-08-29 MED ORDER — NITROGLYCERIN 0.4 MG SL SUBL
0.4000 mg | SUBLINGUAL_TABLET | SUBLINGUAL | 0 refills | Status: DC | PRN
Start: 2022-08-29 — End: 2022-12-06

## 2022-08-29 NOTE — TOC Transition Note (Signed)
Transition of Care [facility]) - CM/SW Discharge Note   Patient Details  Name: Alan Wang MRN: [id number] Date of Birth: 10-Jan-1951  [facility] CM/SW Contact:  Adrian Prows, RN Phone Number: 08/29/2022, 1:11 PM   Clinical Narrative:    D/C orders; HH arranged w/ [facility] at agency notified; no TOC needs.   Final next level of care: Home w Home Health Services Barriers to Discharge: No Barriers Identified   Patient Goals and CMS Choice CMS Medicare.gov Compare Post Acute Care list provided to:: Patient Choice offered to / list presented to : Patient  Discharge Placement                         Discharge Plan and Services Additional resources added to the After Visit Summary for   In-house Referral: Clinical Social Work Discharge Planning Services: NA Post Acute Care Choice: Skilled Nursing Facility          DME Arranged: Dan Humphreys rolling DME Agency: [employer] Date DME Agency Contacted: 08/26/22 Time DME Agency Contacted: 1324 Representative spoke with at DME Agency: Vaughan Basta HH Arranged: PT HH Agency: [facility] Date [facility] Agency Contacted: 08/26/22 Time HH Agency Contacted: 1324 Representative spoke with at [facility] Agency: Marylene Land  Social Determinants of Health (SDOH) Interventions SDOH Screenings   Food Insecurity: No Food Insecurity (08/21/2022)  Housing: Low Risk  (08/21/2022)  Transportation Needs: No Transportation Needs (08/21/2022)  Utilities: Not At Risk (08/21/2022)  Tobacco Use: Low Risk  (08/21/2022)     Readmission Risk Interventions    08/23/2022    1:16 PM  Readmission Risk Prevention Plan  Post Dischage Appt Complete  Medication Screening Complete  Transportation Screening Complete

## 2022-08-29 NOTE — Progress Notes (Signed)
Discharge instructions reviewed with pt.  IV's removed and pt is awaiting his ride.  Will be transported to D/C area via wheelchair.

## 2022-08-29 NOTE — Discharge Summary (Addendum)
Physician Discharge Summary   Patient: Alan Wang MRN: [id number] DOB: [birth year]  Admit date:     08/21/2022  Discharge date: 08/29/22  Discharge Physician: Briant Cedar   PCP: Henrine Screws, MD   Recommendations at discharge:   Follow-up with PCP in 1 week Follow-up with cardiology as scheduled  Discharge Diagnoses: Principal Problem:   Acute metabolic encephalopathy Active Problems:   HTN (hypertension)   Mixed hyperlipidemia   Type 2 diabetes mellitus without complication (HCC)   COVID-19   Acute on chronic diastolic CHF (congestive heart failure) [facility])   Demand ischemia    Hospital Course: Alan Wang is a 72 y.o. male with medical history significant of CAD, type 2 diabetes, hyperlipidemia, hypertension, prior TIA who presents emergency department with altered mental status.  Patient reports that he had a trip in [place] and has been having symptoms for the last 3 weeks.  He has been having worsening confusion.  His wife reported that he was acting strangely and brought him to the ER for further assessment. Respiratory viral panel was positive for COVID. Patient was admitted due to possible COVID encephalopathy.    Today, patient denies any new complaints.  Eager to be discharged home.   Assessment and Plan:  Acute metabolic encephalopathy likely secondary to COVID Resolved MRI brain without any acute abnormalities   Sepsis 2/2 COVID-19 PNA POA Temp 101.2, HR 105/min. RR 27/min on admission Currently stable on room air Completed rocephin in setting of continued cough, fever with acute CHF CRP elevated 11.1 --> 5.7-->3.4   Acute on chronic systolic diastolic heart failure BNP 1034.1 CTA chest negative for PE, moderate bilateral pleural effusion and compressive atelectasis Echocardiogram EF 25 to 30% with global hypokinesis Cardiology consulted, appreciate recs, further ischemic w/u as outpatient Lasix, entresto, jardiance, coreg Outpatient  follow-up   Diabetes mellitus type 2 Continue home regimen   Demand ischemia in setting of above Troponin trend remains flat 57, 58   Hypertension Coreg     Consultants: Cardiology Procedures performed: None Disposition: Home health Diet recommendation:  Cardiac and Carb modified diet   DISCHARGE MEDICATION: Allergies as of 08/29/2022       Reactions   Avandia [rosiglitazone Maleate] Swelling, Other (See Comments)   Lower extremities became swollen        Medication List     STOP taking these medications    Farxiga 10 MG Tabs tablet Generic drug: dapagliflozin propanediol   olmesartan 20 MG tablet Commonly known as: BENICAR   tiZANidine 4 MG tablet Commonly known as: Zanaflex       TAKE these medications    acetaminophen 650 MG CR tablet Commonly known as: TYLENOL Take 1,300 mg by mouth 2 (two) times daily.   aspirin EC 81 MG tablet Take 1 tablet (81 mg total) by mouth daily. Swallow whole. What changed:  when to take this additional instructions   carvedilol 6.25 MG tablet Commonly known as: COREG Take 1 tablet (6.25 mg total) by mouth 2 (two) times daily with a meal. What changed:  medication strength how much to take   celecoxib 200 MG capsule Commonly known as: CELEBREX Take 200 mg by mouth daily.   chlorpheniramine 4 MG tablet Commonly known as: CHLOR-TRIMETON Take 4 mg by mouth at bedtime.   CINNAMON PO Take 2,000 mg by mouth 2 (two) times daily.   cyclobenzaprine 10 MG tablet Commonly known as: FLEXERIL Take 10 mg by mouth at bedtime as needed for muscle spasms.  DULoxetine 30 MG capsule Commonly known as: CYMBALTA Take 30 mg by mouth at bedtime.   empagliflozin 10 MG Tabs tablet Commonly known as: JARDIANCE Take 1 tablet (10 mg total) by mouth daily. Start taking on: August 30, 2022   furosemide 20 MG tablet Commonly known as: LASIX Take 1 tablet (20 mg total) by mouth daily.   insulin NPH Human 100 UNIT/ML  injection Commonly known as: NOVOLIN N Inject 10-35 Units into the skin See admin instructions. Inject 10 units into the skin before breakfast and 35 units before supper   insulin regular 100 units/mL injection Commonly known as: NOVOLIN R Inject 20-25 Units into the skin See admin instructions. Inject 20 units into the skin before breakfast and 25 units before supper   Magnesium 500 MG Tabs Take 500 mg by mouth daily.   metFORMIN 500 MG 24 hr tablet Commonly known as: GLUCOPHAGE-XR Take 2 tablets (1,000 mg total) by mouth 2 (two) times daily.   nitroGLYCERIN 0.4 MG SL tablet Commonly known as: NITROSTAT Place 1 tablet (0.4 mg total) under the tongue every 5 (five) minutes as needed for chest pain.   ondansetron 4 MG tablet Commonly known as: Zofran Take 1 tablet (4 mg total) by mouth every 8 (eight) hours as needed for nausea or vomiting.   OneTouch Verio test strip Generic drug: glucose blood USE TO TEST BLOOD SUGAR 4 TIMES DAILY   Repatha SureClick 140 MG/ML Soaj Generic drug: Evolocumab Inject 140 mg as directed every 14 (fourteen) days.   sacubitril-valsartan 24-26 MG Commonly known as: ENTRESTO Take 1 tablet by mouth 2 (two) times daily.   traMADol 50 MG tablet Commonly known as: ULTRAM Take 1 tablet (50 mg total) by mouth every 6 (six) hours as needed for moderate pain. What changed: how much to take   TURMERIC PO Take 1 capsule by mouth daily.   vitamin C 1000 MG tablet Take 1,000 mg by mouth daily.   Vitamin D 50 MCG (2000 UT) Caps Take 2,000 Units by mouth daily.               Durable Medical Equipment  (From admission, onward)           Start     Ordered   08/26/22 1253  For home use only DME Walker rolling  Once       Question Answer Comment  Walker: With 5 Inch Wheels   Patient needs a walker to treat with the following condition Ambulatory dysfunction      08/26/22 1253            Follow-up Information     Yates Decamp, MD  Follow up on 09/06/2022.   Specialty: Cardiology Why: 1:15 PM Contact information: [address] [place] [address] [phone]         Henrine Screws, MD. Schedule an appointment as soon as possible for a visit in 1 week(s).   Specialty: Family Medicine Contact information: [address] [place] [address] [phone]                Discharge Exam: Ceasar Mons Weights   08/25/22 0500 08/26/22 0751 08/27/22 0501  Weight: 95.8 kg 96.7 kg 86.1 kg   General: NAD  Cardiovascular: S1, S2 present Respiratory: CTAB Abdomen: Soft, nontender, nondistended, bowel sounds present Musculoskeletal: No bilateral pedal edema noted Skin: Normal Psychiatry: Normal mood   Condition at discharge: stable  The results of significant diagnostics from this hospitalization (  including imaging, microbiology, ancillary and laboratory) are listed below for reference.   Imaging Studies: ECHOCARDIOGRAM COMPLETE  Result Date: 08/23/2022    ECHOCARDIOGRAM REPORT   Patient Name:   Alan Wang Date of Exam: 08/23/2022 Medical Rec #:  [id number]     Height:       70.0 in Accession #:    [id number]    Weight:       199.5 lb Date of Birth:  Dec 11, 1950     BSA:          2.085 m Patient Age:    71 years      BP:           152/94 mmHg Patient Gender: M             HR:           92 bpm. Exam Location:  Inpatient Procedure: 2D Echo, Cardiac Doppler, Color Doppler and Intracardiac            Opacification Agent Indications:    Abnormal ECG  History:        Patient has prior history of Echocardiogram examinations, most                 recent 02/23/2021. Cardiomegaly and CHF, CAD, TIA,                 Signs/Symptoms:Altered Mental Status; Risk Factors:Diabetes,                 Dyslipidemia and Hypertension.  Sonographer:    Milda Smart Referring Phys: [id number] ROBERT DORRELL  Sonographer Comments: Image acquisition challenging due to patient body habitus and Image acquisition challenging  due to respiratory motion. IMPRESSIONS  1. Left ventricular ejection fraction, by estimation, is 25 to 30%. The left ventricle has severely decreased function. The left ventricle demonstrates global hypokinesis. Left ventricular diastolic parameters are indeterminate.  2. Right ventricular systolic function is mildly reduced. The right ventricular size is normal.  3. Left atrial size was mildly dilated.  4. Right atrial size was mildly dilated.  5. The mitral valve is normal in structure. Mild mitral valve regurgitation. No evidence of mitral stenosis.  6. The aortic valve is tricuspid. There is mild calcification of the aortic valve. Aortic valve regurgitation is not visualized. No aortic stenosis is present.  7. The inferior vena cava is dilated in size with >50% respiratory variability, suggesting right atrial pressure of 8 mmHg. FINDINGS  Left Ventricle: Left ventricular ejection fraction, by estimation, is 25 to 30%. The left ventricle has severely decreased function. The left ventricle demonstrates global hypokinesis. Definity contrast agent was given IV to delineate the left ventricular endocardial borders. The left ventricular internal cavity size was normal in size. There is no left ventricular hypertrophy. Left ventricular diastolic parameters are indeterminate. Right Ventricle: The right ventricular size is normal. No increase in right ventricular wall thickness. Right ventricular systolic function is mildly reduced. Left Atrium: Left atrial size was mildly dilated. Right Atrium: Right atrial size was mildly dilated. Pericardium: Trivial pericardial effusion is present. The pericardial effusion is anterior to the right ventricle. Mitral Valve: The mitral valve is normal in structure. Mild mitral valve regurgitation. No evidence of mitral valve stenosis. Tricuspid Valve: The tricuspid valve is normal in structure. Tricuspid valve regurgitation is trivial. No evidence of tricuspid stenosis. Aortic Valve:  The aortic valve is tricuspid. There is mild calcification of the aortic valve. Aortic valve regurgitation is not visualized. No aortic stenosis is  present. Pulmonic Valve: The pulmonic valve was normal in structure. Pulmonic valve regurgitation is not visualized. No evidence of pulmonic stenosis. Aorta: The aortic root is normal in size and structure. Venous: The inferior vena cava is dilated in size with greater than 50% respiratory variability, suggesting right atrial pressure of 8 mmHg. IAS/Shunts: No atrial level shunt detected by color flow Doppler.  LEFT VENTRICLE PLAX 2D LVIDd:         5.50 cm   Diastology LVIDs:         5.10 cm   LV e' medial:    4.87 cm/s LV PW:         0.70 cm   LV E/e' medial:  20.3 LV IVS:        0.70 cm   LV e' lateral:   4.05 cm/s LVOT diam:     2.40 cm   LV E/e' lateral: 24.4 LVOT Area:     4.52 cm  RIGHT VENTRICLE             IVC RV S prime:     12.10 cm/s  IVC diam: 2.10 cm TAPSE (M-mode): 1.6 cm LEFT ATRIUM           Index        RIGHT ATRIUM           Index LA diam:      4.20 cm 2.01 cm/m   RA Area:     18.70 cm LA Vol (A2C): 55.6 ml 26.66 ml/m  RA Volume:   51.10 ml  24.51 ml/m LA Vol (A4C): 46.1 ml 22.11 ml/m   AORTA Ao Root diam: 3.50 cm Ao Asc diam:  3.50 cm MITRAL VALVE MV Area (PHT): 4.93 cm    SHUNTS MV Decel Time: 154 msec    Systemic Diam: 2.40 cm MR Peak grad: 14.1 mmHg MR Vmax:      188.00 cm/s MV E velocity: 99.00 cm/s Arvilla Meres MD Electronically signed by Arvilla Meres MD Signature Date/Time: 08/23/2022/1:24:13 PM    Final    MR BRAIN WO CONTRAST  Result Date: 08/22/2022 CLINICAL DATA:  Altered mental status. EXAM: MRI HEAD WITHOUT CONTRAST TECHNIQUE: Multiplanar, multiecho pulse sequences of the brain and surrounding structures were obtained without intravenous contrast. COMPARISON:  CT head without contrast 08/21/2022. MR head 06/22/2016 FINDINGS: Brain: No acute infarct, hemorrhage, or mass lesion is present. Moderate generalized atrophy and  white matter disease bilaterally is similar to the prior exam. The ventricles are proportionate to the degree of atrophy. Dilated perivascular spaces are present within the basal ganglia. A remote right paramedian pontine infarct is present. Remote lacunar infarcts are present in the left cerebellum. No significant extraaxial fluid collection is present. The internal auditory canals are within normal limits. Vascular: Flow is present in the major intracranial arteries. Skull and upper cervical spine: Mild degenerative changes are present in the upper cervical spine. Craniocervical junction is normal. Marrow signal is normal. Sinuses/Orbits: Mild mucosal thickening is present throughout the ethmoid air cells and right frontal sinus. A fluid level is present in the right maxillary sinus. Mastoid air cells are clear. Bilateral lens replacements are noted. Globes and orbits are otherwise unremarkable. Other: IMPRESSION: 1. No acute intracranial abnormality or significant interval change. 2. Moderate generalized atrophy and white matter disease likely reflects the sequela of chronic microvascular ischemia. 3. Remote lacunar infarcts of the right paramedian pons and left cerebellum. 4. Mild sinus disease. Electronically Signed   By: Virl Son.D.  On: 08/22/2022 19:05   CT Head Wo Contrast  Result Date: 08/21/2022 CLINICAL DATA:  Weakness, decreased balance. EXAM: CT HEAD WITHOUT CONTRAST TECHNIQUE: Contiguous axial images were obtained from the base of the skull through the vertex without intravenous contrast. RADIATION DOSE REDUCTION: This exam was performed according to the departmental dose-optimization program which includes automated exposure control, adjustment of the mA and/or kV according to patient size and/or use of iterative reconstruction technique. COMPARISON:  CT head dated 06/22/2016. FINDINGS: Motion artifact limits the sensitivity of the exam. Brain: No evidence of acute infarction,  hemorrhage, hydrocephalus, extra-axial collection or mass lesion/mass effect. There is mild cerebral volume loss with associated ex vacuo dilatation. Periventricular white matter hypoattenuation likely represents chronic small vessel ischemic disease. Vascular: There are vascular calcifications in the carotid siphons. Skull: Normal. Negative for fracture or focal lesion. Sinuses/Orbits: There is bilateral ethmoid sinus disease. Other: None. IMPRESSION: 1. No acute intracranial pathology. 2. Bilateral ethmoid sinus disease. Electronically Signed   By: Romona Curls M.D.   On: 08/21/2022 16:20   CT Angio Chest PE W/Cm &/Or Wo Cm  Result Date: 08/21/2022 CLINICAL DATA:  Acute shortness of breath beginning this morning. Recent long plane flight. Clinical suspicion for pulmonary embolism. EXAM: CT ANGIOGRAPHY CHEST WITH CONTRAST TECHNIQUE: Multidetector CT imaging of the chest was performed using the standard protocol during bolus administration of intravenous contrast. Multiplanar CT image reconstructions and MIPs were obtained to evaluate the vascular anatomy. RADIATION DOSE REDUCTION: This exam was performed according to the departmental dose-optimization program which includes automated exposure control, adjustment of the mA and/or kV according to patient size and/or use of iterative reconstruction technique. CONTRAST:  75mL OMNIPAQUE IOHEXOL 350 MG/ML SOLN COMPARISON:  None Available. FINDINGS: Cardiovascular: Satisfactory opacification of pulmonary arteries noted, and no pulmonary emboli identified. No evidence of thoracic aortic dissection or aneurysm. Mediastinum/Nodes: No masses or pathologically enlarged lymph nodes identified. Lungs/Pleura: Moderate bilateral pleural effusions are seen with compressive atelectasis. Diffuse interstitial prominence is also seen, suspicious for interstitial edema. No evidence of pulmonary consolidation. Upper abdomen: 2.4 cm fat attenuation left adrenal mass, consistent with  benign myelolipoma (No followup imaging is recommended). Musculoskeletal: No suspicious bone lesions identified. Review of the MIP images confirms the above findings. IMPRESSION: No evidence of pulmonary embolism. Moderate bilateral pleural effusions and compressive atelectasis. Diffuse interstitial prominence, suspicious for interstitial edema. Electronically Signed   By: Danae Orleans M.D.   On: 08/21/2022 16:20   DG Chest Port 1 View  Result Date: 08/21/2022 CLINICAL DATA:  Shortness of breath EXAM: PORTABLE CHEST 1 VIEW COMPARISON:  11/16/2012 FINDINGS: Transverse diameter of heart is increased. There are no signs of pulmonary edema. There is no focal pulmonary consolidation. Apparent shift of mediastinum to the right may be due to rotation. Low lung volumes. There is no significant pleural effusion or pneumothorax. There is previous arthroplasty and right shoulder. IMPRESSION: Cardiomegaly. There are no signs of pulmonary edema or focal pulmonary consolidation. Low lung volumes limit evaluation of lower lung fields. Electronically Signed   By: Ernie Avena M.D.   On: 08/21/2022 14:59    Microbiology: Results for orders placed or performed during the hospital encounter of 08/21/22  Resp panel by RT-PCR (RSV, Flu A&B, Covid) Anterior Nasal Swab     Status: Abnormal   Collection Time: 08/21/22  2:30 PM   Specimen: Anterior Nasal Swab  Result Value Ref Range Status   SARS Coronavirus 2 by RT PCR POSITIVE (A) NEGATIVE Final    Comment: (  NOTE) SARS-CoV-2 target nucleic acids are DETECTED.  The SARS-CoV-2 RNA is generally detectable in upper respiratory specimens during the acute phase of infection. Positive results are indicative of the presence of the identified virus, but do not rule out bacterial infection or co-infection with other pathogens not detected by the test. Clinical correlation with patient history and other diagnostic information is necessary to determine patient infection  status. The expected result is Negative.  Fact Sheet for Patients: BloggerCourse.com  Fact Sheet for Healthcare Providers: SeriousBroker.it  This test is not yet approved or cleared by the [place] FDA and  has been authorized for detection and/or diagnosis of SARS-CoV-2 by FDA under an Emergency Use Authorization (EUA).  This EUA will remain in effect (meaning this test can be used) for the duration of  the COVID-19 declaration under Section 564(b)(1) of the A ct, 21 U.S.C. section 360bbb-3(b)(1), unless the authorization is terminated or revoked sooner.     Influenza A by PCR NEGATIVE NEGATIVE Final   Influenza B by PCR NEGATIVE NEGATIVE Final    Comment: (NOTE) The Xpert Xpress SARS-CoV-2/FLU/RSV plus assay is intended as an aid in the diagnosis of influenza from Nasopharyngeal swab specimens and should not be used as a sole basis for treatment. Nasal washings and aspirates are unacceptable for Xpert Xpress SARS-CoV-2/FLU/RSV testing.  Fact Sheet for Patients: BloggerCourse.com  Fact Sheet for Healthcare Providers: SeriousBroker.it  This test is not yet approved or cleared by the [place] FDA and has been authorized for detection and/or diagnosis of SARS-CoV-2 by FDA under an Emergency Use Authorization (EUA). This EUA will remain in effect (meaning this test can be used) for the duration of the COVID-19 declaration under Section 564(b)(1) of the Act, 21 U.S.C. section 360bbb-3(b)(1), unless the authorization is terminated or revoked.     Resp Syncytial Virus by PCR NEGATIVE NEGATIVE Final    Comment: (NOTE) Fact Sheet for Patients: BloggerCourse.com  Fact Sheet for Healthcare Providers: SeriousBroker.it  This test is not yet approved or cleared by the [place] FDA and has been authorized for detection  and/or diagnosis of SARS-CoV-2 by FDA under an Emergency Use Authorization (EUA). This EUA will remain in effect (meaning this test can be used) for the duration of the COVID-19 declaration under Section 564(b)(1) of the Act, 21 U.S.C. section 360bbb-3(b)(1), unless the authorization is terminated or revoked.  Performed at [facility], [address] [place], [place] [address]   Blood Culture (routine x 2)     Status: None   Collection Time: 08/21/22  2:56 PM   Specimen: BLOOD  Result Value Ref Range Status   Specimen Description   Final    BLOOD BLOOD RIGHT HAND Performed at [facility], [address], [place], [place] [address]    Special Requests   Final    BOTTLES DRAWN AEROBIC AND ANAEROBIC Blood Culture adequate volume Performed at [facility], [address] [place], [place] [address]    Culture   Final    NO GROWTH 5 DAYS Performed at [facility], [address] [place], [place] [address]    Report Status 08/26/2022 FINAL  Final  Blood Culture (routine x 2)     Status: None   Collection Time: 08/21/22  2:58 PM   Specimen: BLOOD  Result Value Ref Range Status   Specimen Description   Final    BLOOD BLOOD RIGHT ARM Performed at [facility], [address] [place], [place] [address]  Special Requests   Final    BOTTLES DRAWN AEROBIC AND ANAEROBIC Blood Culture adequate volume Performed at [facility], [address], [place], [place] [address]    Culture   Final    NO GROWTH 5 DAYS Performed at [facility], [address], [place], [place] [address]    Report Status 08/26/2022 FINAL  Final    Labs: CBC: Recent Labs  Lab 08/24/22 0542 08/25/22 0619 08/26/22 0546 08/27/22 0559 08/28/22 0653  WBC 6.6 6.9 8.9 10.4 10.1  NEUTROABS  --   --  4.8 6.0 5.2  HGB 15.4 18.0* 17.7* 17.4* 17.9*  HCT 48.0 56.4* 55.4* 54.8* 56.8*  MCV 88.1 90.1  88.1 88.4 89.3  PLT 180 181 208 228 248   Basic Metabolic Panel: Recent Labs  Lab 08/23/22 0505 08/24/22 0542 08/25/22 0619 08/26/22 0546 08/28/22 0653  NA 137 136 139 139 133*  K 3.6 3.6 4.1 3.8 3.8  CL 103 103 102 102 99  CO2 23 26 27 27 25   GLUCOSE 196* 177* 141* 132* 143*  BUN 19 20 20 21  25*  CREATININE 0.75 0.51* 0.90 0.70 0.82  CALCIUM 8.6* 8.6* 9.2 8.9 9.1  MG  --  1.9 2.1  --   --    Liver Function Tests: Recent Labs  Lab 08/23/22 0505 08/24/22 0542  AST 20 21  ALT 15 16  ALKPHOS 59 60  BILITOT 0.9 0.8  PROT 6.7 6.4*  ALBUMIN 3.2* 3.2*   CBG: Recent Labs  Lab 08/28/22 1135 08/28/22 1659 08/28/22 2148 08/29/22 0820 08/29/22 1221  GLUCAP 262* 177* 243* 170* 251*    Discharge time spent: greater than 30 minutes.  Signed: Briant Cedar, MD [facility] 08/29/2022

## 2022-08-30 DIAGNOSIS — I2489 Other forms of acute ischemic heart disease: Secondary | ICD-10-CM | POA: Diagnosis not present

## 2022-08-30 DIAGNOSIS — J1282 Pneumonia due to coronavirus disease 2019: Secondary | ICD-10-CM | POA: Diagnosis not present

## 2022-08-30 DIAGNOSIS — U071 COVID-19: Secondary | ICD-10-CM | POA: Diagnosis not present

## 2022-08-30 DIAGNOSIS — Z794 Long term (current) use of insulin: Secondary | ICD-10-CM | POA: Diagnosis not present

## 2022-08-30 DIAGNOSIS — I5043 Acute on chronic combined systolic (congestive) and diastolic (congestive) heart failure: Secondary | ICD-10-CM | POA: Diagnosis not present

## 2022-08-30 DIAGNOSIS — E119 Type 2 diabetes mellitus without complications: Secondary | ICD-10-CM | POA: Diagnosis not present

## 2022-08-30 DIAGNOSIS — Z9181 History of falling: Secondary | ICD-10-CM | POA: Diagnosis not present

## 2022-08-30 DIAGNOSIS — I2510 Atherosclerotic heart disease of native coronary artery without angina pectoris: Secondary | ICD-10-CM | POA: Diagnosis not present

## 2022-08-30 DIAGNOSIS — I110 Hypertensive heart disease with heart failure: Secondary | ICD-10-CM | POA: Diagnosis not present

## 2022-08-30 DIAGNOSIS — J45909 Unspecified asthma, uncomplicated: Secondary | ICD-10-CM | POA: Diagnosis not present

## 2022-08-30 DIAGNOSIS — Z7984 Long term (current) use of oral hypoglycemic drugs: Secondary | ICD-10-CM | POA: Diagnosis not present

## 2022-08-30 DIAGNOSIS — Z7982 Long term (current) use of aspirin: Secondary | ICD-10-CM | POA: Diagnosis not present

## 2022-09-02 DIAGNOSIS — J45909 Unspecified asthma, uncomplicated: Secondary | ICD-10-CM | POA: Diagnosis not present

## 2022-09-02 DIAGNOSIS — J1282 Pneumonia due to coronavirus disease 2019: Secondary | ICD-10-CM | POA: Diagnosis not present

## 2022-09-02 DIAGNOSIS — Z9181 History of falling: Secondary | ICD-10-CM | POA: Diagnosis not present

## 2022-09-02 DIAGNOSIS — I2510 Atherosclerotic heart disease of native coronary artery without angina pectoris: Secondary | ICD-10-CM | POA: Diagnosis not present

## 2022-09-02 DIAGNOSIS — U071 COVID-19: Secondary | ICD-10-CM | POA: Diagnosis not present

## 2022-09-02 DIAGNOSIS — E119 Type 2 diabetes mellitus without complications: Secondary | ICD-10-CM | POA: Diagnosis not present

## 2022-09-02 DIAGNOSIS — I110 Hypertensive heart disease with heart failure: Secondary | ICD-10-CM | POA: Diagnosis not present

## 2022-09-02 DIAGNOSIS — I2489 Other forms of acute ischemic heart disease: Secondary | ICD-10-CM | POA: Diagnosis not present

## 2022-09-02 DIAGNOSIS — I5043 Acute on chronic combined systolic (congestive) and diastolic (congestive) heart failure: Secondary | ICD-10-CM | POA: Diagnosis not present

## 2022-09-02 DIAGNOSIS — Z794 Long term (current) use of insulin: Secondary | ICD-10-CM | POA: Diagnosis not present

## 2022-09-03 DIAGNOSIS — G9341 Metabolic encephalopathy: Secondary | ICD-10-CM | POA: Diagnosis not present

## 2022-09-03 DIAGNOSIS — I5043 Acute on chronic combined systolic (congestive) and diastolic (congestive) heart failure: Secondary | ICD-10-CM | POA: Diagnosis not present

## 2022-09-06 ENCOUNTER — Ambulatory Visit: Payer: Medicare HMO | Admitting: Cardiology

## 2022-09-06 ENCOUNTER — Encounter: Payer: Self-pay | Admitting: Cardiology

## 2022-09-06 VITALS — BP 104/66 | HR 82 | Resp 16 | Ht 70.0 in | Wt 197.0 lb

## 2022-09-06 DIAGNOSIS — I2510 Atherosclerotic heart disease of native coronary artery without angina pectoris: Secondary | ICD-10-CM

## 2022-09-06 DIAGNOSIS — Z794 Long term (current) use of insulin: Secondary | ICD-10-CM | POA: Diagnosis not present

## 2022-09-06 DIAGNOSIS — I10 Essential (primary) hypertension: Secondary | ICD-10-CM

## 2022-09-06 DIAGNOSIS — I5022 Chronic systolic (congestive) heart failure: Secondary | ICD-10-CM

## 2022-09-06 DIAGNOSIS — E1165 Type 2 diabetes mellitus with hyperglycemia: Secondary | ICD-10-CM | POA: Diagnosis not present

## 2022-09-06 MED ORDER — FUROSEMIDE 20 MG PO TABS
20.0000 mg | ORAL_TABLET | Freq: Every day | ORAL | 2 refills | Status: DC | PRN
Start: 2022-09-06 — End: 2022-11-09

## 2022-09-06 NOTE — Progress Notes (Signed)
Primary Physician/Referring:  Henrine Screws, MD  Patient ID: Alan Wang, male    DOB: [birth year], 72 y.o.   MRN: [id number]  Chief Complaint  Patient presents with   Coronary artery disease involving native coronary artery of   HFrEF   Follow-up   HPI:    Alan Wang  is a 72 y.o. Caucasian male with history of known coronary artery disease, angioplasty to his LAD, circumflex and also ramus intermediate vessels.  He has history of diabetes mellitus with diabetic retinopathy, hypertension and hyperlipidemia.  Last coronary angiography on 09/16/2015 after a abnormal stress test revealing flush occluded moderate sized PL branch of RCA but had excellent collaterals.  Patient admitted to [facility] long hospital on 08/21/2022 and discharged 1 week later with acute metabolic encephalopathy diagnosed with COVID-19 infection" encephalopathy.  He had new onset cardiomyopathy with severe LV systolic dysfunction.  He now presents for follow-up.  Fortunately has recuperated well, dyspnea has improved and no further confusion.  Except for dizziness he is completely asymptomatic.   Past Medical History:  Diagnosis Date   Arthritis    Asthma    Cancer (HCC)    skin   Coronary artery disease    COVID    Diabetes mellitus    type 2, insulin dependent for 30+ years   History of kidney stones 11/2012   Hyperlipemia    Hypertension    IDDM (insulin dependent diabetes mellitus)    Shortness of breath    Stroke (HCC)    hx TIA   Family History  Problem Relation Age of Onset   Emphysema Mother    COPD Mother    Alzheimer's disease Father    Asthma Paternal Aunt    Heart attack Paternal Grandfather    Past Surgical History:  Procedure Laterality Date   ANGIOPLASTY  04/06/2011   x3    CARDIAC CATHETERIZATION     CARDIAC CATHETERIZATION N/A 09/16/2015   Procedure: Left Heart Cath and Coronary Angiography;  Surgeon: Yates Decamp, MD;  Location: [facility] INVASIVE CV LAB;  Service: Cardiovascular;   Laterality: N/A;   cataracts     CHOLECYSTECTOMY     HERNIA REPAIR     x2   KNEE ARTHROSCOPY     LEFT HEART CATHETERIZATION WITH CORONARY ANGIOGRAM N/A 03/08/2011   Procedure: LEFT HEART CATHETERIZATION WITH CORONARY ANGIOGRAM;  Surgeon: Pamella Pert, MD;  Location: [facility] CATH LAB;  Service: Cardiovascular;  Laterality: N/A;   PERCUTANEOUS CORONARY STENT INTERVENTION (PCI-S) N/A 04/06/2011   Procedure: PERCUTANEOUS CORONARY STENT INTERVENTION (PCI-S);  Surgeon: Pamella Pert, MD;  Location: [facility] CATH LAB;  Service: Cardiovascular;  Laterality: N/A;   TOTAL SHOULDER ARTHROPLASTY Right 01/07/2022   Procedure: TOTAL SHOULDER ARTHROPLASTY;  Surgeon: Francena Hanly, MD;  Location: [facility] ORS;  Service: Orthopedics;  Laterality: Right;    Social History   Tobacco Use   Smoking status: Never   Smokeless tobacco: Never  Substance Use Topics   Alcohol use: Yes    Comment: occasionally   Marital status: Married  ROS  Review of Systems  Cardiovascular:  Negative for chest pain, dyspnea on exertion and leg swelling.  Neurological:  Positive for dizziness.   Objective  Blood pressure 104/66, pulse 82, resp. rate 16, height 5\' 10"  (1.778 m), weight 197 lb (89.4 kg), SpO2 96 %.     09/06/2022    1:15 PM 08/29/2022    4:21 AM 08/28/2022    9:50 PM  Vitals with BMI  Height  5\' 10"     Weight 197 lbs    BMI 28.27    Systolic 104 117 161  Diastolic 66 74 71  Pulse 82 72 85     Physical Exam Neck:     Vascular: No carotid bruit or JVD.  Cardiovascular:     Rate and Rhythm: Normal rate and regular rhythm.     Pulses: Intact distal pulses.          Dorsalis pedis pulses are 0 on the right side and 1+ on the left side.       Posterior tibial pulses are 0 on the right side and 1+ on the left side.     Heart sounds: Normal heart sounds. No murmur heard.    No gallop.  Pulmonary:     Effort: Pulmonary effort is normal.     Breath sounds: Normal breath sounds.  Abdominal:     General:  Bowel sounds are normal.     Palpations: Abdomen is soft.  Musculoskeletal:     Right lower leg: No edema.     Left lower leg: No edema.    Laboratory examination:   Recent Labs    08/25/22 0619 08/26/22 0546 08/28/22 0653  NA 139 139 133*  K 4.1 3.8 3.8  CL 102 102 99  CO2 27 27 25   GLUCOSE 141* 132* 143*  BUN 20 21 25*  CREATININE 0.90 0.70 0.82  CALCIUM 9.2 8.9 9.1  GFRNONAA >60 >60 >60      Latest Ref Rng & Units 08/28/2022    6:53 AM 08/26/2022    5:46 AM 08/25/2022    6:19 AM  CMP  Glucose 70 - 99 mg/dL 096  045  409   BUN 8 - 23 mg/dL 25  21  20    Creatinine 0.61 - 1.24 mg/dL 8.11  9.14  7.82   Sodium 135 - 145 mmol/L 133  139  139   Potassium 3.5 - 5.1 mmol/L 3.8  3.8  4.1   Chloride 98 - 111 mmol/L 99  102  102   CO2 22 - 32 mmol/L 25  27  27    Calcium 8.9 - 10.3 mg/dL 9.1  8.9  9.2       Latest Ref Rng & Units 08/28/2022    6:53 AM 08/27/2022    5:59 AM 08/26/2022    5:46 AM  CBC  WBC 4.0 - 10.5 K/uL 10.1  10.4  8.9   Hemoglobin 13.0 - 17.0 g/dL 95.6  21.3  08.6   Hematocrit 39.0 - 52.0 % 56.8  54.8  55.4   Platelets 150 - 400 K/uL 248  228  208    Lipid Panel     Component Value Date/Time   CHOL 115 06/23/2016 0617   TRIG 80 06/23/2016 0617   HDL 40 (L) 06/23/2016 0617   CHOLHDL 2.9 06/23/2016 0617   VLDL 16 06/23/2016 0617   LDLCALC 59 06/23/2016 0617   HEMOGLOBIN A1C Lab Results  Component Value Date   HGBA1C 6.8 (H) 12/29/2021   MPG 148.46 12/29/2021   External Labs:   Labs 11/20/2021:  Total cholesterol 141, triglycerides 140, HDL 43, LDL 73.  A1c 7.0%.  Serum glucose 171, BUN 15, creatinine 0.92, EGFR 80 to mL, potassium 5.2, LFTs normal.  Radiology:  No results found.  Cardiac Studies:   Coronary angiogram 09/16/2015:  Mid LAD 3.0 x 23 mm and 2.75 x 12 mm Xience placed 05/25/08, ramus intermediate 3.5 x 15 mm integrity  DES placed 03/08/11 and proximal and mid circumflex 2.75 x 22 2 mm resolute placed 04/06/2011 widely patent,  occluded PL branch of RCA with collaterals from the circumflex coronary artery.  LVEF normal at 55%.  Lower extremity arterial duplex 03/17/2017: No hemodynamically significant stenoses are identified in the  lower extremity arterial system. This exam reveals mildly decreased perfusion of the right lower extremity and normal perfusion of the left lower extremity (ABI).  RABI 0.86, LABI 1.00. Triphasic waveform(normal) noted at both ankle. Study may suggest minimal diffuse disease.  Nuclear stress test   [08/22/2015]:  1. Resting EKG demonstrated normal sinus rhythm, normal axis, poor R-wave progression, low voltage complexes. Stress EKG is nondiagnostic for ischemia as its pharmacologic stress test. Stress symptoms included dyspnea. 2. Perfusion imaging study demonstrates a small sized mild lateral wall ischemia noted towards the apex. In addition there is mild anterior wall thinning extending from the mid ventricle towards the apex. The left ventricle is normal in size with rest and stress images. Left ventricular systolic function calculated by QGS was moderately depressed at 37% with global hypokinesis. In a patient with knows severe 3 vessel CAD and PTCA, this represents a high risk study.  Nocturnal Oximetry 09/30/2015:  02 saturation less than 89% for 4 hours and less than 88% for 3 hours, overall low 79%. Patient qualifies for nocturnal oxygen supplementation in Medicare group 1. Referred for sleep study.  Echocardiogram 08/23/2022:  1. Left ventricular ejection fraction, by estimation, is 25 to 30%. The left ventricle has severely decreased function. The left ventricle demonstrates global hypokinesis. Left ventricular diastolic parameters are indeterminate.  2. Right ventricular systolic function is mildly reduced. The right ventricular size is normal.  3. Left atrial size was mildly dilated.  4. Right atrial size was mildly dilated.  5. The mitral valve is normal in structure. Mild mitral  valve regurgitation. No evidence of mitral stenosis.  6. The aortic valve is tricuspid. There is mild calcification of the aortic valve. Aortic valve regurgitation is not visualized. No aortic stenosis is present.  7. The inferior vena cava is dilated in size with >50% respiratory variability, suggesting right atrial pressure of 8 mmHg. Compared to 08/26/2015, EF has decreased from 45%.  EKG   EKG 09/06/2022: Normal sinus rhythm with rate of 81 bpm, borderline LAE, left axis deviation, left anterior fascicular block.  Low-voltage.  Single PVC.  Compared to 02/09/2021, no significant change   Allergies & Medications   Allergies  Allergen Reactions   Avandia [Rosiglitazone Maleate] Swelling and Other (See Comments)    Lower extremities became swollen    Current Outpatient Medications:    acetaminophen (TYLENOL) 650 MG CR tablet, Take 1,300 mg by mouth 2 (two) times daily., Disp: , Rfl:    Ascorbic Acid (VITAMIN C) 1000 MG tablet, Take 1,000 mg by mouth daily., Disp: , Rfl:    aspirin EC 81 MG tablet, Take 1 tablet (81 mg total) by mouth daily. Swallow whole., Disp: 30 tablet, Rfl: 0   carvedilol (COREG) 6.25 MG tablet, Take 1 tablet (6.25 mg total) by mouth 2 (two) times daily with a meal., Disp: 60 tablet, Rfl: 0   celecoxib (CELEBREX) 200 MG capsule, Take 200 mg by mouth daily., Disp: , Rfl:    chlorpheniramine (CHLOR-TRIMETON) 4 MG tablet, Take 4 mg by mouth at bedtime., Disp: , Rfl:    Cholecalciferol (VITAMIN D) 2000 units CAPS, Take 2,000 Units by mouth daily., Disp: , Rfl:    CINNAMON PO, Take  2,000 mg by mouth 2 (two) times daily., Disp: , Rfl:    cyclobenzaprine (FLEXERIL) 10 MG tablet, Take 10 mg by mouth at bedtime as needed for muscle spasms., Disp: , Rfl:    DULoxetine (CYMBALTA) 30 MG capsule, Take 30 mg by mouth at bedtime., Disp: , Rfl:    empagliflozin (JARDIANCE) 10 MG TABS tablet, Take 1 tablet (10 mg total) by mouth daily., Disp: 30 tablet, Rfl: 0   insulin NPH (HUMULIN  N,NOVOLIN N) 100 UNIT/ML injection, Inject 10-35 Units into the skin See admin instructions. Inject 10 units into the skin before breakfast and 35 units before supper, Disp: , Rfl:    insulin regular (NOVOLIN R,HUMULIN R) 100 units/mL injection, Inject 20-25 Units into the skin See admin instructions. Inject 20 units into the skin before breakfast and 25 units before supper, Disp: , Rfl:    Magnesium 500 MG TABS, Take 500 mg by mouth daily., Disp: , Rfl:    metFORMIN (GLUCOPHAGE-XR) 500 MG 24 hr tablet, Take 2 tablets (1,000 mg total) by mouth 2 (two) times daily., Disp: , Rfl:    nitroGLYCERIN (NITROSTAT) 0.4 MG SL tablet, Place 1 tablet (0.4 mg total) under the tongue every 5 (five) minutes as needed for chest pain., Disp: 30 tablet, Rfl: 0   ondansetron (ZOFRAN) 4 MG tablet, Take 1 tablet (4 mg total) by mouth every 8 (eight) hours as needed for nausea or vomiting., Disp: 10 tablet, Rfl: 0   ONETOUCH VERIO test strip, USE TO TEST BLOOD SUGAR 4 TIMES DAILY, Disp: , Rfl:    REPATHA SURECLICK 140 MG/ML SOAJ, Inject 140 mg as directed every 14 (fourteen) days., Disp: , Rfl:    sacubitril-valsartan (ENTRESTO) 24-26 MG, Take 1 tablet by mouth 2 (two) times daily., Disp: 60 tablet, Rfl: 0   traMADol (ULTRAM) 50 MG tablet, Take 1 tablet (50 mg total) by mouth every 6 (six) hours as needed for moderate pain. (Patient taking differently: Take 100 mg by mouth every 6 (six) hours as needed for moderate pain.), Disp: 20 tablet, Rfl: 0   TURMERIC PO, Take 1 capsule by mouth daily., Disp: , Rfl:    furosemide (LASIX) 20 MG tablet, Take 1 tablet (20 mg total) by mouth daily as needed for fluid or edema., Disp: 30 tablet, Rfl: 2   Assessment     ICD-10-CM   1. Coronary artery disease involving native coronary artery of native heart without angina pectoris  I25.10 EKG 12-Lead    2. Chronic systolic heart failure (HCC)  Z61.09 furosemide (LASIX) 20 MG tablet    3. Primary hypertension  I10     4. Type 2  diabetes mellitus with hyperglycemia, with long-term current use of insulin (HCC)  E11.65    Z79.4        Meds ordered this encounter  Medications   furosemide (LASIX) 20 MG tablet    Sig: Take 1 tablet (20 mg total) by mouth daily as needed for fluid or edema.    Dispense:  30 tablet    Refill:  2   Medications Discontinued During This Encounter  Medication Reason   furosemide (LASIX) 20 MG tablet Reorder    Recommendations:    Alan Wang  is a 73 y.o. Caucasian male with history of known coronary artery disease, angioplasty to his LAD, circumflex and also ramus intermediate vessels.  He has history of diabetes mellitus with diabetic retinopathy, hypertension and hyperlipidemia.  Last coronary angiography on 09/16/2015 after a abnormal stress test  revealing flush occluded moderate sized PL branch of RCA but had excellent collaterals.  Patient admitted to [facility] long hospital on 08/21/2022 and discharged 1 week later with acute metabolic encephalopathy diagnosed with COVID-19 infection" encephalopathy.  He had new onset cardiomyopathy with severe LV systolic dysfunction.  He now presents for follow-up.  1. Coronary artery disease involving native coronary artery of native heart without angina pectoris Patient admitted with COVID encephalopathy and COVID-pneumonia, has recuperated well, lung sounds are clear heart sounds are normal.  No clinical evidence of heart failure.  He has not had any anginal symptoms. - EKG 12-Lead  2. Chronic systolic heart failure (HCC) Patient is not in acute decompensated heart failure.  His Marcelline Deist was changed over to Sandy while in the hospital.   He is presently on Entresto and also carvedilol, along with Jardiance.  No clinical evidence of heart failure.  Advised him to discontinue Lasix on a daily basis to see with ice blood pressure will improve and his dizziness will improve so I can uptitrate guideline directed medical therapy for cardiomyopathy  and chronic systolic heart failure.  I would like to see him back in 3 weeks, if blood pressure is stable, I would like to uptitrate his carvedilol and also Entresto as tolerated. - furosemide (LASIX) 20 MG tablet; Take 1 tablet (20 mg total) by mouth daily as needed for fluid or edema.  Dispense: 30 tablet; Refill: 2  3. Primary hypertension Blood pressure is soft.  Hence I did not make any changes to his medications.  Reviewed his labs, renal function has remained stable.  4. Type 2 diabetes mellitus with hyperglycemia, with long-term current use of insulin (HCC) Patient could benefit from Ozempic from cardiac standpoint however patient is also on insulin and high dose of metformin, patient states that he has had high fluctuation in his blood sugar, ranging from 70s to all the way to 250.  Given his complexity of diabetes, I did not make any changes to his medications.  He does have an appointment to see Dr. Talmage Nap soon, advised him to discuss with her regarding diabetes management as he is very complex.  I will see him back in 3 weeks.   Yates Decamp, MD, [facility] 09/06/2022, 2:01 PM Office: [phone] Fax: [phone] Pager: [phone]    CC: Sabino Gasser, MD

## 2022-09-08 DIAGNOSIS — J1282 Pneumonia due to coronavirus disease 2019: Secondary | ICD-10-CM | POA: Diagnosis not present

## 2022-09-08 DIAGNOSIS — I5043 Acute on chronic combined systolic (congestive) and diastolic (congestive) heart failure: Secondary | ICD-10-CM | POA: Diagnosis not present

## 2022-09-08 DIAGNOSIS — Z9181 History of falling: Secondary | ICD-10-CM | POA: Diagnosis not present

## 2022-09-08 DIAGNOSIS — U071 COVID-19: Secondary | ICD-10-CM | POA: Diagnosis not present

## 2022-09-08 DIAGNOSIS — I110 Hypertensive heart disease with heart failure: Secondary | ICD-10-CM | POA: Diagnosis not present

## 2022-09-08 DIAGNOSIS — Z794 Long term (current) use of insulin: Secondary | ICD-10-CM | POA: Diagnosis not present

## 2022-09-08 DIAGNOSIS — E119 Type 2 diabetes mellitus without complications: Secondary | ICD-10-CM | POA: Diagnosis not present

## 2022-09-08 DIAGNOSIS — I2510 Atherosclerotic heart disease of native coronary artery without angina pectoris: Secondary | ICD-10-CM | POA: Diagnosis not present

## 2022-09-08 DIAGNOSIS — I2489 Other forms of acute ischemic heart disease: Secondary | ICD-10-CM | POA: Diagnosis not present

## 2022-09-08 DIAGNOSIS — J45909 Unspecified asthma, uncomplicated: Secondary | ICD-10-CM | POA: Diagnosis not present

## 2022-09-15 DIAGNOSIS — I10 Essential (primary) hypertension: Secondary | ICD-10-CM | POA: Diagnosis not present

## 2022-09-15 DIAGNOSIS — Z8673 Personal history of transient ischemic attack (TIA), and cerebral infarction without residual deficits: Secondary | ICD-10-CM | POA: Diagnosis not present

## 2022-09-15 DIAGNOSIS — Z789 Other specified health status: Secondary | ICD-10-CM | POA: Diagnosis not present

## 2022-09-15 DIAGNOSIS — E11319 Type 2 diabetes mellitus with unspecified diabetic retinopathy without macular edema: Secondary | ICD-10-CM | POA: Diagnosis not present

## 2022-09-15 DIAGNOSIS — Z8679 Personal history of other diseases of the circulatory system: Secondary | ICD-10-CM | POA: Diagnosis not present

## 2022-09-15 DIAGNOSIS — D751 Secondary polycythemia: Secondary | ICD-10-CM | POA: Diagnosis not present

## 2022-09-15 DIAGNOSIS — E1165 Type 2 diabetes mellitus with hyperglycemia: Secondary | ICD-10-CM | POA: Diagnosis not present

## 2022-09-15 DIAGNOSIS — E7800 Pure hypercholesterolemia, unspecified: Secondary | ICD-10-CM | POA: Diagnosis not present

## 2022-09-15 DIAGNOSIS — G609 Hereditary and idiopathic neuropathy, unspecified: Secondary | ICD-10-CM | POA: Diagnosis not present

## 2022-09-15 DIAGNOSIS — D721 Eosinophilia, unspecified: Secondary | ICD-10-CM | POA: Diagnosis not present

## 2022-09-27 ENCOUNTER — Ambulatory Visit: Payer: Medicare HMO | Admitting: Cardiology

## 2022-09-27 NOTE — Progress Notes (Deleted)
Primary Physician/Referring:  Henrine Screws, MD  Patient ID: Alan Wang, male    DOB: 11-09-1950, 72 y.o.   MRN: [id number]  No chief complaint on file.  HPI:    Alan Wang  is a 72 y.o. Caucasian male with history of known coronary artery disease, angioplasty to his LAD, circumflex and also ramus intermediate vessels.  He has history of diabetes mellitus with diabetic retinopathy, hypertension and hyperlipidemia.  Last coronary angiography on 09/16/2015 after a abnormal stress test revealing flush occluded moderate sized PL branch of RCA but had excellent collaterals.  Patient admitted to [facility] on 08/21/2022 and discharged 1 week later with acute metabolic encephalopathy diagnosed with COVID-19 infection" encephalopathy.  He had new onset cardiomyopathy with severe LV systolic dysfunction.  He now presents for follow-up.  Fortunately has recuperated well, dyspnea has improved and no further confusion.  Except for dizziness he is completely asymptomatic.   Past Medical History:  Diagnosis Date   Arthritis    Asthma    Cancer (HCC)    skin   Coronary artery disease    COVID    Diabetes mellitus    type 2, insulin dependent for 30+ years   History of kidney stones 11/2012   Hyperlipemia    Hypertension    IDDM (insulin dependent diabetes mellitus)    Shortness of breath    Stroke (HCC)    hx TIA   Family History  Problem Relation Age of Onset   Emphysema Mother    COPD Mother    Alzheimer's disease Father    Asthma Paternal Aunt    Heart attack Paternal Grandfather    Past Surgical History:  Procedure Laterality Date   ANGIOPLASTY  04/06/2011   x3    CARDIAC CATHETERIZATION     CARDIAC CATHETERIZATION N/A 09/16/2015   Procedure: Left Heart Cath and Coronary Angiography;  Surgeon: Yates Decamp, MD;  Location: [facility] INVASIVE CV LAB;  Service: Cardiovascular;  Laterality: N/A;   cataracts     CHOLECYSTECTOMY     HERNIA REPAIR     x2   KNEE ARTHROSCOPY      LEFT HEART CATHETERIZATION WITH CORONARY ANGIOGRAM N/A 03/08/2011   Procedure: LEFT HEART CATHETERIZATION WITH CORONARY ANGIOGRAM;  Surgeon: Pamella Pert, MD;  Location: [facility] CATH LAB;  Service: Cardiovascular;  Laterality: N/A;   PERCUTANEOUS CORONARY STENT INTERVENTION (PCI-S) N/A 04/06/2011   Procedure: PERCUTANEOUS CORONARY STENT INTERVENTION (PCI-S);  Surgeon: Pamella Pert, MD;  Location: [facility] CATH LAB;  Service: Cardiovascular;  Laterality: N/A;   TOTAL SHOULDER ARTHROPLASTY Right 01/07/2022   Procedure: TOTAL SHOULDER ARTHROPLASTY;  Surgeon: Francena Hanly, MD;  Location: [facility] ORS;  Service: Orthopedics;  Laterality: Right;    Social History   Tobacco Use   Smoking status: Never   Smokeless tobacco: Never  Substance Use Topics   Alcohol use: Yes    Comment: occasionally   Marital status: Married  ROS  Review of Systems  Cardiovascular:  Negative for chest pain, dyspnea on exertion and leg swelling.  Neurological:  Positive for dizziness.   Objective  There were no vitals taken for this visit.     09/06/2022    1:15 PM 08/29/2022    4:21 AM 08/28/2022    9:50 PM  Vitals with BMI  Height 5\' 10"     Weight 197 lbs    BMI 28.27    Systolic 104 117 409  Diastolic 66 74 71  Pulse 82 72 85  Physical Exam Neck:     Vascular: No carotid bruit or JVD.  Cardiovascular:     Rate and Rhythm: Normal rate and regular rhythm.     Pulses: Intact distal pulses.          Dorsalis pedis pulses are 0 on the right side and 1+ on the left side.       Posterior tibial pulses are 0 on the right side and 1+ on the left side.     Heart sounds: Normal heart sounds. No murmur heard.    No gallop.  Pulmonary:     Effort: Pulmonary effort is normal.     Breath sounds: Normal breath sounds.  Abdominal:     General: Bowel sounds are normal.     Palpations: Abdomen is soft.  Musculoskeletal:     Right lower leg: No edema.     Left lower leg: No edema.    Laboratory examination:    Recent Labs    08/25/22 0619 08/26/22 0546 08/28/22 0653  NA 139 139 133*  K 4.1 3.8 3.8  CL 102 102 99  CO2 27 27 25   GLUCOSE 141* 132* 143*  BUN 20 21 25*  CREATININE 0.90 0.70 0.82  CALCIUM 9.2 8.9 9.1  GFRNONAA >60 >60 >60      Latest Ref Rng & Units 08/28/2022    6:53 AM 08/26/2022    5:46 AM 08/25/2022    6:19 AM  CMP  Glucose 70 - 99 mg/dL 409  811  914   BUN 8 - 23 mg/dL 25  21  20    Creatinine 0.61 - 1.24 mg/dL 7.82  9.56  2.13   Sodium 135 - 145 mmol/L 133  139  139   Potassium 3.5 - 5.1 mmol/L 3.8  3.8  4.1   Chloride 98 - 111 mmol/L 99  102  102   CO2 22 - 32 mmol/L 25  27  27    Calcium 8.9 - 10.3 mg/dL 9.1  8.9  9.2       Latest Ref Rng & Units 08/28/2022    6:53 AM 08/27/2022    5:59 AM 08/26/2022    5:46 AM  CBC  WBC 4.0 - 10.5 K/uL 10.1  10.4  8.9   Hemoglobin 13.0 - 17.0 g/dL 08.6  57.8  46.9   Hematocrit 39.0 - 52.0 % 56.8  54.8  55.4   Platelets 150 - 400 K/uL 248  228  208    Lipid Panel     Component Value Date/Time   CHOL 115 06/23/2016 0617   TRIG 80 06/23/2016 0617   HDL 40 (L) 06/23/2016 0617   CHOLHDL 2.9 06/23/2016 0617   VLDL 16 06/23/2016 0617   LDLCALC 59 06/23/2016 0617   HEMOGLOBIN A1C Lab Results  Component Value Date   HGBA1C 6.8 (H) 12/29/2021   MPG 148.46 12/29/2021   External Labs:   Labs 11/20/2021:  Total cholesterol 141, triglycerides 140, HDL 43, LDL 73.  A1c 7.0%.  Serum glucose 171, BUN 15, creatinine 0.92, EGFR 80 to mL, potassium 5.2, LFTs normal.  Radiology:  No results found.  Cardiac Studies:   Coronary angiogram 09/16/2015:  Mid LAD 3.0 x 23 mm and 2.75 x 12 mm Xience placed 05/25/08, ramus intermediate 3.5 x 15 mm integrity DES placed 03/08/11 and proximal and mid circumflex 2.75 x 22 2 mm resolute placed 04/06/2011 widely patent, occluded PL branch of RCA with collaterals from the circumflex coronary artery.  LVEF normal at  55%.  Lower extremity arterial duplex 03/17/2017: No hemodynamically  significant stenoses are identified in the  lower extremity arterial system. This exam reveals mildly decreased perfusion of the right lower extremity and normal perfusion of the left lower extremity (ABI).  RABI 0.86, LABI 1.00. Triphasic waveform(normal) noted at both ankle. Study may suggest minimal diffuse disease.  Nuclear stress test   [08/22/2015]:  1. Resting EKG demonstrated normal sinus rhythm, normal axis, poor R-wave progression, low voltage complexes. Stress EKG is nondiagnostic for ischemia as its pharmacologic stress test. Stress symptoms included dyspnea. 2. Perfusion imaging study demonstrates a small sized mild lateral wall ischemia noted towards the apex. In addition there is mild anterior wall thinning extending from the mid ventricle towards the apex. The left ventricle is normal in size with rest and stress images. Left ventricular systolic function calculated by QGS was moderately depressed at 37% with global hypokinesis. In a patient with knows severe 3 vessel CAD and PTCA, this represents a high risk study.  Nocturnal Oximetry 09/30/2015:  02 saturation less than 89% for 4 hours and less than 88% for 3 hours, overall low 79%. Patient qualifies for nocturnal oxygen supplementation in Medicare group 1. Referred for sleep study.  Echocardiogram 08/23/2022:  1. Left ventricular ejection fraction, by estimation, is 25 to 30%. The left ventricle has severely decreased function. The left ventricle demonstrates global hypokinesis. Left ventricular diastolic parameters are indeterminate.  2. Right ventricular systolic function is mildly reduced. The right ventricular size is normal.  3. Left atrial size was mildly dilated.  4. Right atrial size was mildly dilated.  5. The mitral valve is normal in structure. Mild mitral valve regurgitation. No evidence of mitral stenosis.  6. The aortic valve is tricuspid. There is mild calcification of the aortic valve. Aortic valve regurgitation is not  visualized. No aortic stenosis is present.  7. The inferior vena cava is dilated in size with >50% respiratory variability, suggesting right atrial pressure of 8 mmHg. Compared to 08/26/2015, EF has decreased from 45%.  EKG   EKG 09/06/2022: Normal sinus rhythm with rate of 81 bpm, borderline LAE, left axis deviation, left anterior fascicular block.  Low-voltage.  Single PVC.  Compared to 02/09/2021, no significant change   Allergies & Medications   Allergies  Allergen Reactions   Avandia [Rosiglitazone Maleate] Swelling and Other (See Comments)    Lower extremities became swollen    Current Outpatient Medications:    acetaminophen (TYLENOL) 650 MG CR tablet, Take 1,300 mg by mouth 2 (two) times daily., Disp: , Rfl:    Ascorbic Acid (VITAMIN C) 1000 MG tablet, Take 1,000 mg by mouth daily., Disp: , Rfl:    aspirin EC 81 MG tablet, Take 1 tablet (81 mg total) by mouth daily. Swallow whole., Disp: 30 tablet, Rfl: 0   carvedilol (COREG) 6.25 MG tablet, Take 1 tablet (6.25 mg total) by mouth 2 (two) times daily with a meal., Disp: 60 tablet, Rfl: 0   celecoxib (CELEBREX) 200 MG capsule, Take 200 mg by mouth daily., Disp: , Rfl:    chlorpheniramine (CHLOR-TRIMETON) 4 MG tablet, Take 4 mg by mouth at bedtime., Disp: , Rfl:    Cholecalciferol (VITAMIN D) 2000 units CAPS, Take 2,000 Units by mouth daily., Disp: , Rfl:    CINNAMON PO, Take 2,000 mg by mouth 2 (two) times daily., Disp: , Rfl:    cyclobenzaprine (FLEXERIL) 10 MG tablet, Take 10 mg by mouth at bedtime as needed for muscle spasms., Disp: , Rfl:  DULoxetine (CYMBALTA) 30 MG capsule, Take 30 mg by mouth at bedtime., Disp: , Rfl:    empagliflozin (JARDIANCE) 10 MG TABS tablet, Take 1 tablet (10 mg total) by mouth daily., Disp: 30 tablet, Rfl: 0   furosemide (LASIX) 20 MG tablet, Take 1 tablet (20 mg total) by mouth daily as needed for fluid or edema., Disp: 30 tablet, Rfl: 2   insulin NPH (HUMULIN N,NOVOLIN N) 100 UNIT/ML injection,  Inject 10-35 Units into the skin See admin instructions. Inject 10 units into the skin before breakfast and 35 units before supper, Disp: , Rfl:    insulin regular (NOVOLIN R,HUMULIN R) 100 units/mL injection, Inject 20-25 Units into the skin See admin instructions. Inject 20 units into the skin before breakfast and 25 units before supper, Disp: , Rfl:    Magnesium 500 MG TABS, Take 500 mg by mouth daily., Disp: , Rfl:    metFORMIN (GLUCOPHAGE-XR) 500 MG 24 hr tablet, Take 2 tablets (1,000 mg total) by mouth 2 (two) times daily., Disp: , Rfl:    nitroGLYCERIN (NITROSTAT) 0.4 MG SL tablet, Place 1 tablet (0.4 mg total) under the tongue every 5 (five) minutes as needed for chest pain., Disp: 30 tablet, Rfl: 0   ondansetron (ZOFRAN) 4 MG tablet, Take 1 tablet (4 mg total) by mouth every 8 (eight) hours as needed for nausea or vomiting., Disp: 10 tablet, Rfl: 0   ONETOUCH VERIO test strip, USE TO TEST BLOOD SUGAR 4 TIMES DAILY, Disp: , Rfl:    REPATHA SURECLICK 140 MG/ML SOAJ, Inject 140 mg as directed every 14 (fourteen) days., Disp: , Rfl:    sacubitril-valsartan (ENTRESTO) 24-26 MG, Take 1 tablet by mouth 2 (two) times daily., Disp: 60 tablet, Rfl: 0   traMADol (ULTRAM) 50 MG tablet, Take 1 tablet (50 mg total) by mouth every 6 (six) hours as needed for moderate pain. (Patient taking differently: Take 100 mg by mouth every 6 (six) hours as needed for moderate pain.), Disp: 20 tablet, Rfl: 0   TURMERIC PO, Take 1 capsule by mouth daily., Disp: , Rfl:    Assessment     ICD-10-CM   1. Primary hypertension  I10     2. Chronic systolic heart failure (HCC)  Z61.09     3. Coronary artery disease involving native coronary artery of native heart without angina pectoris  I25.10        No orders of the defined types were placed in this encounter.  There are no discontinued medications.   Recommendations:    WINDLE HUEBERT  is a 72 y.o. Caucasian male with history of known coronary artery disease,  angioplasty to his LAD, circumflex and also ramus intermediate vessels.  He has history of diabetes mellitus with diabetic retinopathy, hypertension and hyperlipidemia.  Last coronary angiography on 09/16/2015 after a abnormal stress test revealing flush occluded moderate sized PL branch of RCA but had excellent collaterals.  Patient admitted to [facility] long hospital on 08/21/2022 and discharged 1 week later with acute metabolic encephalopathy diagnosed with COVID-19 infection" encephalopathy.  He had new onset cardiomyopathy with severe LV systolic dysfunction.  He now presents for follow-up.  1. Coronary artery disease involving native coronary artery of native heart without angina pectoris Patient admitted with COVID encephalopathy and COVID-pneumonia, has recuperated well, lung sounds are clear heart sounds are normal.  No clinical evidence of heart failure.  He has not had any anginal symptoms. - EKG 12-Lead  2. Chronic systolic heart failure [facility]) Patient is  not in acute decompensated heart failure.  His Marcelline Deist was changed over to [place] while in the hospital.   He is presently on Entresto and also carvedilol, along with Jardiance.  No clinical evidence of heart failure.  Advised him to discontinue Lasix on a daily basis to see with ice blood pressure will improve and his dizziness will improve so I can uptitrate guideline directed medical therapy for cardiomyopathy and chronic systolic heart failure.  I would like to see him back in 3 weeks, if blood pressure is stable, I would like to uptitrate his carvedilol and also Entresto as tolerated. - furosemide (LASIX) 20 MG tablet; Take 1 tablet (20 mg total) by mouth daily as needed for fluid or edema.  Dispense: 30 tablet; Refill: 2  3. Primary hypertension Blood pressure is soft.  Hence I did not make any changes to his medications.  Reviewed his labs, renal function has remained stable.  4. Type 2 diabetes mellitus with hyperglycemia, with  long-term current use of insulin (HCC) Patient could benefit from Ozempic from cardiac standpoint however patient is also on insulin and high dose of metformin, patient states that he has had high fluctuation in his blood sugar, ranging from 70s to all the way to 250.  Given his complexity of diabetes, I did not make any changes to his medications.  He does have an appointment to see Dr. Talmage Nap soon, advised him to discuss with her regarding diabetes management as he is very complex.  I will see him back in 3 weeks.   Yates Decamp, MD, [facility] 09/27/2022, 2:54 PM Office: [phone] Fax: [phone] Pager: [phone]    CC: Sabino Gasser, MD

## 2022-10-01 DIAGNOSIS — I110 Hypertensive heart disease with heart failure: Secondary | ICD-10-CM | POA: Diagnosis not present

## 2022-10-01 DIAGNOSIS — I5042 Chronic combined systolic (congestive) and diastolic (congestive) heart failure: Secondary | ICD-10-CM | POA: Diagnosis not present

## 2022-10-01 DIAGNOSIS — R300 Dysuria: Secondary | ICD-10-CM | POA: Diagnosis not present

## 2022-10-01 DIAGNOSIS — E119 Type 2 diabetes mellitus without complications: Secondary | ICD-10-CM | POA: Diagnosis not present

## 2022-10-01 DIAGNOSIS — I2510 Atherosclerotic heart disease of native coronary artery without angina pectoris: Secondary | ICD-10-CM | POA: Diagnosis not present

## 2022-10-07 DIAGNOSIS — E785 Hyperlipidemia, unspecified: Secondary | ICD-10-CM | POA: Diagnosis not present

## 2022-10-07 DIAGNOSIS — R269 Unspecified abnormalities of gait and mobility: Secondary | ICD-10-CM | POA: Diagnosis not present

## 2022-10-07 DIAGNOSIS — F419 Anxiety disorder, unspecified: Secondary | ICD-10-CM | POA: Diagnosis not present

## 2022-10-07 DIAGNOSIS — I509 Heart failure, unspecified: Secondary | ICD-10-CM | POA: Diagnosis not present

## 2022-10-07 DIAGNOSIS — F329 Major depressive disorder, single episode, unspecified: Secondary | ICD-10-CM | POA: Diagnosis not present

## 2022-10-07 DIAGNOSIS — Z8249 Family history of ischemic heart disease and other diseases of the circulatory system: Secondary | ICD-10-CM | POA: Diagnosis not present

## 2022-10-07 DIAGNOSIS — I2510 Atherosclerotic heart disease of native coronary artery without angina pectoris: Secondary | ICD-10-CM | POA: Diagnosis not present

## 2022-10-07 DIAGNOSIS — Z7984 Long term (current) use of oral hypoglycemic drugs: Secondary | ICD-10-CM | POA: Diagnosis not present

## 2022-10-07 DIAGNOSIS — M1990 Unspecified osteoarthritis, unspecified site: Secondary | ICD-10-CM | POA: Diagnosis not present

## 2022-10-07 DIAGNOSIS — Z008 Encounter for other general examination: Secondary | ICD-10-CM | POA: Diagnosis not present

## 2022-10-07 DIAGNOSIS — E1122 Type 2 diabetes mellitus with diabetic chronic kidney disease: Secondary | ICD-10-CM | POA: Diagnosis not present

## 2022-10-07 DIAGNOSIS — Z794 Long term (current) use of insulin: Secondary | ICD-10-CM | POA: Diagnosis not present

## 2022-10-07 DIAGNOSIS — I130 Hypertensive heart and chronic kidney disease with heart failure and stage 1 through stage 4 chronic kidney disease, or unspecified chronic kidney disease: Secondary | ICD-10-CM | POA: Diagnosis not present

## 2022-10-12 DIAGNOSIS — I10 Essential (primary) hypertension: Secondary | ICD-10-CM | POA: Diagnosis not present

## 2022-10-12 DIAGNOSIS — E7800 Pure hypercholesterolemia, unspecified: Secondary | ICD-10-CM | POA: Diagnosis not present

## 2022-10-12 DIAGNOSIS — E1165 Type 2 diabetes mellitus with hyperglycemia: Secondary | ICD-10-CM | POA: Diagnosis not present

## 2022-10-29 DIAGNOSIS — I739 Peripheral vascular disease, unspecified: Secondary | ICD-10-CM | POA: Diagnosis not present

## 2022-10-29 DIAGNOSIS — I5043 Acute on chronic combined systolic (congestive) and diastolic (congestive) heart failure: Secondary | ICD-10-CM | POA: Diagnosis not present

## 2022-10-29 DIAGNOSIS — I2510 Atherosclerotic heart disease of native coronary artery without angina pectoris: Secondary | ICD-10-CM | POA: Diagnosis not present

## 2022-11-05 DIAGNOSIS — K219 Gastro-esophageal reflux disease without esophagitis: Secondary | ICD-10-CM | POA: Diagnosis not present

## 2022-11-05 DIAGNOSIS — S300XXA Contusion of lower back and pelvis, initial encounter: Secondary | ICD-10-CM | POA: Diagnosis not present

## 2022-11-05 DIAGNOSIS — R0602 Shortness of breath: Secondary | ICD-10-CM | POA: Diagnosis not present

## 2022-11-05 DIAGNOSIS — I509 Heart failure, unspecified: Secondary | ICD-10-CM | POA: Diagnosis not present

## 2022-11-09 ENCOUNTER — Encounter: Payer: Self-pay | Admitting: Cardiology

## 2022-11-09 ENCOUNTER — Ambulatory Visit: Payer: Medicare HMO | Admitting: Cardiology

## 2022-11-09 VITALS — BP 130/80 | HR 89 | Resp 16 | Ht 70.0 in | Wt 197.0 lb

## 2022-11-09 DIAGNOSIS — I10 Essential (primary) hypertension: Secondary | ICD-10-CM | POA: Diagnosis not present

## 2022-11-09 DIAGNOSIS — I2510 Atherosclerotic heart disease of native coronary artery without angina pectoris: Secondary | ICD-10-CM | POA: Diagnosis not present

## 2022-11-09 DIAGNOSIS — I5022 Chronic systolic (congestive) heart failure: Secondary | ICD-10-CM | POA: Diagnosis not present

## 2022-11-09 MED ORDER — SACUBITRIL-VALSARTAN 49-51 MG PO TABS
1.0000 | ORAL_TABLET | Freq: Two times a day (BID) | ORAL | Status: AC
Start: 2022-11-09 — End: ?

## 2022-11-09 MED ORDER — FUROSEMIDE 40 MG PO TABS: 40.0000 mg | ORAL_TABLET | Freq: Every day | ORAL | 2 refills | Status: DC | PRN

## 2022-11-09 MED ORDER — ASPIRIN 81 MG PO CHEW
81.0000 mg | CHEWABLE_TABLET | Freq: Every day | ORAL | Status: DC
Start: 2022-11-09 — End: 2023-10-02

## 2022-11-09 NOTE — Progress Notes (Signed)
Primary Physician/Referring:  Henrine Screws, MD  Patient ID: Alan Wang, male    DOB: October 09, 1950, 72 y.o.   MRN: [id number]  Chief Complaint  Patient presents with   Follow-up   f/u for fluid retention   Cardiomyopathy   Chronic systolic heart failure   HPI:    Alan Wang  is a 72 y.o. Caucasian male with history of known coronary artery disease, angioplasty to his LAD, circumflex and also ramus intermediate vessels.  He has history of diabetes mellitus with diabetic retinopathy, hypertension and hyperlipidemia.  Last coronary angiography on 09/16/2015 after a abnormal stress test revealing flush occluded moderate sized PL branch of RCA but had excellent collaterals.  Patient admitted to [facility] long hospital on 08/21/2022 and discharged 1 week later with acute metabolic encephalopathy diagnosed with COVID-19 infection" encephalopathy.  He had new onset cardiomyopathy with severe LV systolic dysfunction.  This is a 4-week follow-up. Patient had developed acute decompensated heart failure with weight gain and dyspnea, was started back on furosemide, 40 mg daily, his weight is now back to baseline.  Dyspnea has improved.    Past Medical History:  Diagnosis Date   Arthritis    Asthma    Cancer (HCC)    skin   Coronary artery disease    COVID    Diabetes mellitus    type 2, insulin dependent for 30+ years   History of kidney stones 11/2012   Hyperlipemia    Hypertension    IDDM (insulin dependent diabetes mellitus)    Shortness of breath    Stroke (HCC)    hx TIA   Family History  Problem Relation Age of Onset   Emphysema Mother    COPD Mother    Alzheimer's disease Father    Asthma Paternal Aunt    Heart attack Paternal Grandfather    Past Surgical History:  Procedure Laterality Date   ANGIOPLASTY  04/06/2011   x3    CARDIAC CATHETERIZATION     CARDIAC CATHETERIZATION N/A 09/16/2015   Procedure: Left Heart Cath and Coronary Angiography;  Surgeon: Yates Decamp, MD;   Location: [facility] INVASIVE CV LAB;  Service: Cardiovascular;  Laterality: N/A;   cataracts     CHOLECYSTECTOMY     HERNIA REPAIR     x2   KNEE ARTHROSCOPY     LEFT HEART CATHETERIZATION WITH CORONARY ANGIOGRAM N/A 03/08/2011   Procedure: LEFT HEART CATHETERIZATION WITH CORONARY ANGIOGRAM;  Surgeon: Pamella Pert, MD;  Location: [facility] CATH LAB;  Service: Cardiovascular;  Laterality: N/A;   PERCUTANEOUS CORONARY STENT INTERVENTION (PCI-S) N/A 04/06/2011   Procedure: PERCUTANEOUS CORONARY STENT INTERVENTION (PCI-S);  Surgeon: Pamella Pert, MD;  Location: [facility] CATH LAB;  Service: Cardiovascular;  Laterality: N/A;   TOTAL SHOULDER ARTHROPLASTY Right 01/07/2022   Procedure: TOTAL SHOULDER ARTHROPLASTY;  Surgeon: Francena Hanly, MD;  Location: [facility] ORS;  Service: Orthopedics;  Laterality: Right;    Social History   Tobacco Use   Smoking status: Never   Smokeless tobacco: Never  Substance Use Topics   Alcohol use: Yes    Comment: occasionally   Marital status: Married  ROS  Review of Systems  Cardiovascular:  Negative for chest pain, dyspnea on exertion and leg swelling.  Neurological:  Positive for dizziness.   Objective  Blood pressure 130/80, pulse 89, resp. rate 16, height 5\' 10"  (1.778 m), weight 197 lb (89.4 kg), SpO2 95%.     11/09/2022    2:17 PM 09/06/2022    1:15  PM 08/29/2022    4:21 AM  Vitals with BMI  Height 5\' 10"  5\' 10"    Weight 197 lbs 197 lbs   BMI 28.27 28.27   Systolic 130 104 161  Diastolic 80 66 74  Pulse 89 82 72     Physical Exam Neck:     Vascular: No carotid bruit or JVD.  Cardiovascular:     Rate and Rhythm: Normal rate and regular rhythm.     Pulses: Intact distal pulses.          Dorsalis pedis pulses are 0 on the right side and 1+ on the left side.       Posterior tibial pulses are 0 on the right side and 1+ on the left side.     Heart sounds: Normal heart sounds. No murmur heard.    No gallop.  Pulmonary:     Effort: Pulmonary effort is  normal.     Breath sounds: Normal breath sounds.  Abdominal:     General: Bowel sounds are normal.     Palpations: Abdomen is soft.  Musculoskeletal:     Right lower leg: No edema.     Left lower leg: No edema.    Laboratory examination:   Recent Labs    08/25/22 0619 08/26/22 0546 08/28/22 0653  NA 139 139 133*  K 4.1 3.8 3.8  CL 102 102 99  CO2 27 27 25   GLUCOSE 141* 132* 143*  BUN 20 21 25*  CREATININE 0.90 0.70 0.82  CALCIUM 9.2 8.9 9.1  GFRNONAA >60 >60 >60      Latest Ref Rng & Units 08/28/2022    6:53 AM 08/26/2022    5:46 AM 08/25/2022    6:19 AM  CMP  Glucose 70 - 99 mg/dL 096  045  409   BUN 8 - 23 mg/dL 25  21  20    Creatinine 0.61 - 1.24 mg/dL 8.11  9.14  7.82   Sodium 135 - 145 mmol/L 133  139  139   Potassium 3.5 - 5.1 mmol/L 3.8  3.8  4.1   Chloride 98 - 111 mmol/L 99  102  102   CO2 22 - 32 mmol/L 25  27  27    Calcium 8.9 - 10.3 mg/dL 9.1  8.9  9.2       Latest Ref Rng & Units 08/28/2022    6:53 AM 08/27/2022    5:59 AM 08/26/2022    5:46 AM  CBC  WBC 4.0 - 10.5 K/uL 10.1  10.4  8.9   Hemoglobin 13.0 - 17.0 g/dL 95.6  21.3  08.6   Hematocrit 39.0 - 52.0 % 56.8  54.8  55.4   Platelets 150 - 400 K/uL 248  228  208    Lipid Panel     Component Value Date/Time   CHOL 115 06/23/2016 0617   TRIG 80 06/23/2016 0617   HDL 40 (L) 06/23/2016 0617   CHOLHDL 2.9 06/23/2016 0617   VLDL 16 06/23/2016 0617   LDLCALC 59 06/23/2016 0617   HEMOGLOBIN A1C Lab Results  Component Value Date   HGBA1C 6.8 (H) 12/29/2021   MPG 148.46 12/29/2021   External Labs:   Labs 11/20/2021:  Total cholesterol 141, triglycerides 140, HDL 43, LDL 73.  A1c 7.0%.  Serum glucose 171, BUN 15, creatinine 0.92, EGFR 80 to mL, potassium 5.2, LFTs normal.  Radiology:  No results found.  Cardiac Studies:   Coronary angiogram 09/16/2015:  Mid LAD 3.0 x 23 mm  and 2.75 x 12 mm Xience placed 05/25/08, ramus intermediate 3.5 x 15 mm integrity DES placed 03/08/11 and proximal  and mid circumflex 2.75 x 22 2 mm resolute placed 04/06/2011 widely patent, occluded PL branch of RCA with collaterals from the circumflex coronary artery.  LVEF normal at 55%.  Lower extremity arterial duplex 03/17/2017: No hemodynamically significant stenoses are identified in the  lower extremity arterial system. This exam reveals mildly decreased perfusion of the right lower extremity and normal perfusion of the left lower extremity (ABI).  RABI 0.86, LABI 1.00. Triphasic waveform(normal) noted at both ankle. Study may suggest minimal diffuse disease.  Nocturnal Oximetry 09/30/2015:  02 saturation less than 89% for 4 hours and less than 88% for 3 hours, overall low 79%. Patient qualifies for nocturnal oxygen supplementation in Medicare group 1. Referred for sleep study.  Lexiscan (with Mod Bruce protocol) Nuclear stress test 02/23/2021:  Nondiagnostic ECG stress. Normal LV size in both rest and stress images. Myocardial perfusion is normal with mild diaphragmatic attenuation. A small inferior wall scar cannot be completely excluded. Moderate global hypokinesis of the left ventricle. Stress LV EF: 24%. High risk study due to low LVEF.  Compared to the study done in 2017, mild lateral wall ischemia not present and LVEF was 37%. Findings may represent non ischemic cardiomyopathy.   Echocardiogram 08/23/2022:  1. Left ventricular ejection fraction, by estimation, is 25 to 30%. The left ventricle has severely decreased function. The left ventricle demonstrates global hypokinesis. Left ventricular diastolic parameters are indeterminate.  2. Right ventricular systolic function is mildly reduced. The right ventricular size is normal.  3. Left atrial size was mildly dilated.  4. Right atrial size was mildly dilated.  5. The mitral valve is normal in structure. Mild mitral valve regurgitation. No evidence of mitral stenosis.  6. The aortic valve is tricuspid. There is mild calcification of the aortic valve.  Aortic valve regurgitation is not visualized. No aortic stenosis is present.  7. The inferior vena cava is dilated in size with >50% respiratory variability, suggesting right atrial pressure of 8 mmHg. Compared to 08/26/2015, EF has decreased from 45%.  EKG   EKG 09/06/2022: Normal sinus rhythm with rate of 81 bpm, borderline LAE, left axis deviation, left anterior fascicular block.  Low-voltage.  Single PVC.  Compared to 02/09/2021, no significant change   Allergies & Medications   Allergies  Allergen Reactions   Avandia [Rosiglitazone Maleate] Swelling and Other (See Comments)    Lower extremities became swollen    Current Outpatient Medications:    acetaminophen (TYLENOL) 650 MG CR tablet, Take 1,300 mg by mouth 2 (two) times daily., Disp: , Rfl:    Ascorbic Acid (VITAMIN C) 1000 MG tablet, Take 1,000 mg by mouth daily., Disp: , Rfl:    aspirin (ASPIRIN CHILDRENS) 81 MG chewable tablet, Chew 1 tablet (81 mg total) by mouth daily., Disp: , Rfl:    carvedilol (COREG) 6.25 MG tablet, Take 1 tablet (6.25 mg total) by mouth 2 (two) times daily with a meal., Disp: 60 tablet, Rfl: 0   celecoxib (CELEBREX) 200 MG capsule, Take 200 mg by mouth daily., Disp: , Rfl:    chlorpheniramine (CHLOR-TRIMETON) 4 MG tablet, Take 4 mg by mouth at bedtime., Disp: , Rfl:    Cholecalciferol (VITAMIN D) 2000 units CAPS, Take 2,000 Units by mouth daily., Disp: , Rfl:    CINNAMON PO, Take 2,000 mg by mouth 2 (two) times daily., Disp: , Rfl:    DULoxetine (CYMBALTA) 30  MG capsule, Take 30 mg by mouth at bedtime., Disp: , Rfl:    insulin NPH (HUMULIN N,NOVOLIN N) 100 UNIT/ML injection, Inject 10-35 Units into the skin See admin instructions. Inject 10 units into the skin before breakfast and 35 units before supper, Disp: , Rfl:    insulin regular (NOVOLIN R,HUMULIN R) 100 units/mL injection, Inject 20-25 Units into the skin See admin instructions. Inject 20 units into the skin before breakfast and 25 units before  supper, Disp: , Rfl:    JARDIANCE 10 MG TABS tablet, Take 10 mg by mouth daily., Disp: , Rfl:    Magnesium 500 MG TABS, Take 500 mg by mouth daily., Disp: , Rfl:    MOUNJARO 5 MG/0.5ML Pen, 5mg  Subcutaneous once a week, Disp: , Rfl:    ondansetron (ZOFRAN) 4 MG tablet, Take 1 tablet (4 mg total) by mouth every 8 (eight) hours as needed for nausea or vomiting., Disp: 10 tablet, Rfl: 0   ONETOUCH VERIO test strip, USE TO TEST BLOOD SUGAR 4 TIMES DAILY, Disp: , Rfl:    REPATHA SURECLICK 140 MG/ML SOAJ, Inject 140 mg as directed every 14 (fourteen) days., Disp: , Rfl:    traMADol (ULTRAM) 50 MG tablet, Take 1 tablet (50 mg total) by mouth every 6 (six) hours as needed for moderate pain. (Patient taking differently: Take 100 mg by mouth every 6 (six) hours as needed for moderate pain.), Disp: 20 tablet, Rfl: 0   TURMERIC PO, Take 1 capsule by mouth daily., Disp: , Rfl:    furosemide (LASIX) 40 MG tablet, Take 1 tablet (40 mg total) by mouth daily as needed for fluid or edema., Disp: 30 tablet, Rfl: 2   nitroGLYCERIN (NITROSTAT) 0.4 MG SL tablet, Place 1 tablet (0.4 mg total) under the tongue every 5 (five) minutes as needed for chest pain., Disp: 30 tablet, Rfl: 0  Current Facility-Administered Medications:    sacubitril-valsartan (ENTRESTO) 49-51 mg per tablet, 1 tablet, Oral, BID,    Assessment     ICD-10-CM   1. Chronic systolic heart failure (HCC)  W11.91 sacubitril-valsartan (ENTRESTO) 49-51 mg per tablet    aspirin (ASPIRIN CHILDRENS) 81 MG chewable tablet    Pro b natriuretic peptide (BNP)    Magnesium    Basic metabolic panel    furosemide (LASIX) 40 MG tablet    2. Coronary artery disease involving native coronary artery of native heart without angina pectoris  I25.10 aspirin (ASPIRIN CHILDRENS) 81 MG chewable tablet    3. Primary hypertension  I10         Meds ordered this encounter  Medications   sacubitril-valsartan (ENTRESTO) 49-51 mg per tablet    Order Specific Question:    ACE-inhibitors have NOT been administered in the past 36-hours.    Answer:   YES (confirmed by ordering provider)   aspirin (ASPIRIN CHILDRENS) 81 MG chewable tablet    Sig: Chew 1 tablet (81 mg total) by mouth daily.   furosemide (LASIX) 40 MG tablet    Sig: Take 1 tablet (40 mg total) by mouth daily as needed for fluid or edema.    Dispense:  30 tablet    Refill:  2   Medications Discontinued During This Encounter  Medication Reason   aspirin EC 81 MG tablet    cyclobenzaprine (FLEXERIL) 10 MG tablet    metFORMIN (GLUCOPHAGE-XR) 500 MG 24 hr tablet    furosemide (LASIX) 20 MG tablet Reorder    Recommendations:    OLANDA CARUFEL  is  a 72 y.o. Caucasian male with history of known coronary artery disease, angioplasty to his LAD, circumflex and also ramus intermediate vessels.  He has history of diabetes mellitus with diabetic retinopathy, hypertension and hyperlipidemia.  Last coronary angiography on 09/16/2015 after a abnormal stress test revealing flush occluded moderate sized PL branch of RCA but had excellent collaterals.  Patient admitted to [facility] long hospital on 08/21/2022 and discharged 1 week later with acute metabolic encephalopathy diagnosed with COVID-19 infection" encephalopathy.  He had new onset cardiomyopathy with severe LV systolic dysfunction.  This is a 4-week follow-up.  1. Chronic systolic heart failure (HCC) Patient had developed acute decompensated heart failure with weight gain and dyspnea, was started back on furosemide, 40 mg daily, his weight is now back to baseline.  Dyspnea has improved.  Patient previously was on Entresto low-dose which he completed the course but did not have any refills, we will go ahead and challenge him with moderate dose Entresto 49/51 mg twice daily.  Will obtain BMP in 10 days, advised the patient's wife who manages his medications that if blood pressure is >120/80 mmHg, to go ahead and double up on carvedilol from 6.25 back to 12.5 mg  twice daily which was reduced as his blood pressure was soft on his prior office visit after recent COVID infection. I have refilled his furosemide.  Advised him to weigh himself on a daily basis and keep a record.  - sacubitril-valsartan (ENTRESTO) 49-51 mg per tablet - aspirin (ASPIRIN CHILDRENS) 81 MG chewable tablet; Chew 1 tablet (81 mg total) by mouth daily. - Pro b natriuretic peptide (BNP) - Magnesium - Basic metabolic panel - furosemide (LASIX) 40 MG tablet; Take 1 tablet (40 mg total) by mouth daily as needed for fluid or edema.  Dispense: 30 tablet; Refill: 2  2. Coronary artery disease involving native coronary artery of native heart without angina pectoris Question is whether the new onset cardiomyopathy is ischemic or nonischemic.  He does have multivessel coronary disease.  He is not having any chest pain, I would like to do aggressive medical therapy and guideline directed medical therapy and reassess his LVEF, if it is persistently low, then we will consider cardiac catheterization as his prior stress testing has always been abnormal.  Patient also had discontinued aspirin, restart aspirin. - aspirin (ASPIRIN CHILDRENS) 81 MG chewable tablet; Chew 1 tablet (81 mg total) by mouth daily.  3. Primary hypertension Blood pressure is now stable, presently on carvedilol and we are reinitiating Entresto.  I would like to see him back in 1 month for follow-up.  Other orders - JARDIANCE 10 MG TABS tablet; Take 10 mg by mouth daily. Greggory Keen 5 MG/0.5ML Pen; 5mg  Subcutaneous once a week    Yates Decamp, MD, [facility] 11/09/2022, 10:19 PM Office: [phone] Fax: [phone] Pager: [phone]

## 2022-11-09 NOTE — Patient Instructions (Signed)
You are starting today on Entresto 49/51 mg dose twice daily.  Will go to [facility] and obtain lab work in about 10 days from today after starting the new dose of the [employer].  After starting Entresto for a week, if blood pressure is >110 mmHg systolic, increase your carvedilol from 6.25 mg to 12.5 mg twice daily.  I will see you back in 4 weeks.

## 2022-11-11 ENCOUNTER — Encounter: Payer: Self-pay | Admitting: Cardiology

## 2022-11-23 DIAGNOSIS — R5383 Other fatigue: Secondary | ICD-10-CM | POA: Diagnosis not present

## 2022-11-23 DIAGNOSIS — E1165 Type 2 diabetes mellitus with hyperglycemia: Secondary | ICD-10-CM | POA: Diagnosis not present

## 2022-11-23 DIAGNOSIS — D721 Eosinophilia, unspecified: Secondary | ICD-10-CM | POA: Diagnosis not present

## 2022-11-23 DIAGNOSIS — E7800 Pure hypercholesterolemia, unspecified: Secondary | ICD-10-CM | POA: Diagnosis not present

## 2022-11-23 DIAGNOSIS — I10 Essential (primary) hypertension: Secondary | ICD-10-CM | POA: Diagnosis not present

## 2022-12-04 ENCOUNTER — Observation Stay (HOSPITAL_COMMUNITY)
Admission: EM | Admit: 2022-12-04 | Discharge: 2022-12-06 | Disposition: A | Discharge status: Home / Self Care | Payer: Medicare HMO | Attending: Internal Medicine | Admitting: Internal Medicine

## 2022-12-04 ENCOUNTER — Emergency Department (HOSPITAL_COMMUNITY): Payer: Medicare HMO

## 2022-12-04 ENCOUNTER — Encounter (HOSPITAL_COMMUNITY): Payer: Self-pay | Admitting: Internal Medicine

## 2022-12-04 ENCOUNTER — Other Ambulatory Visit: Payer: Self-pay

## 2022-12-04 DIAGNOSIS — I110 Hypertensive heart disease with heart failure: Secondary | ICD-10-CM | POA: Insufficient documentation

## 2022-12-04 DIAGNOSIS — Z8673 Personal history of transient ischemic attack (TIA), and cerebral infarction without residual deficits: Secondary | ICD-10-CM | POA: Insufficient documentation

## 2022-12-04 DIAGNOSIS — Z7982 Long term (current) use of aspirin: Secondary | ICD-10-CM | POA: Insufficient documentation

## 2022-12-04 DIAGNOSIS — R778 Other specified abnormalities of plasma proteins: Secondary | ICD-10-CM | POA: Insufficient documentation

## 2022-12-04 DIAGNOSIS — R0602 Shortness of breath: Secondary | ICD-10-CM | POA: Diagnosis not present

## 2022-12-04 DIAGNOSIS — E119 Type 2 diabetes mellitus without complications: Secondary | ICD-10-CM | POA: Insufficient documentation

## 2022-12-04 DIAGNOSIS — Z85828 Personal history of other malignant neoplasm of skin: Secondary | ICD-10-CM | POA: Insufficient documentation

## 2022-12-04 DIAGNOSIS — R7989 Other specified abnormal findings of blood chemistry: Secondary | ICD-10-CM | POA: Diagnosis present

## 2022-12-04 DIAGNOSIS — I5023 Acute on chronic systolic (congestive) heart failure: Principal | ICD-10-CM | POA: Diagnosis present

## 2022-12-04 DIAGNOSIS — I2510 Atherosclerotic heart disease of native coronary artery without angina pectoris: Secondary | ICD-10-CM | POA: Diagnosis present

## 2022-12-04 DIAGNOSIS — Z1152 Encounter for screening for COVID-19: Secondary | ICD-10-CM | POA: Insufficient documentation

## 2022-12-04 DIAGNOSIS — Z8616 Personal history of COVID-19: Secondary | ICD-10-CM | POA: Diagnosis not present

## 2022-12-04 DIAGNOSIS — Z794 Long term (current) use of insulin: Secondary | ICD-10-CM | POA: Diagnosis not present

## 2022-12-04 DIAGNOSIS — J45909 Unspecified asthma, uncomplicated: Secondary | ICD-10-CM | POA: Insufficient documentation

## 2022-12-04 DIAGNOSIS — I10 Essential (primary) hypertension: Secondary | ICD-10-CM | POA: Diagnosis present

## 2022-12-04 DIAGNOSIS — E440 Moderate protein-calorie malnutrition: Secondary | ICD-10-CM | POA: Insufficient documentation

## 2022-12-04 DIAGNOSIS — I5022 Chronic systolic (congestive) heart failure: Secondary | ICD-10-CM

## 2022-12-04 DIAGNOSIS — I5033 Acute on chronic diastolic (congestive) heart failure: Principal | ICD-10-CM | POA: Insufficient documentation

## 2022-12-04 DIAGNOSIS — R079 Chest pain, unspecified: Secondary | ICD-10-CM | POA: Diagnosis not present

## 2022-12-04 DIAGNOSIS — I509 Heart failure, unspecified: Secondary | ICD-10-CM | POA: Diagnosis not present

## 2022-12-04 DIAGNOSIS — G459 Transient cerebral ischemic attack, unspecified: Secondary | ICD-10-CM

## 2022-12-04 LAB — HEPATIC FUNCTION PANEL
ALT: 15 U/L (ref 0–44)
AST: 15 U/L (ref 15–41)
Albumin: 3.5 g/dL (ref 3.5–5.0)
Alkaline Phosphatase: 71 U/L (ref 38–126)
Bilirubin, Direct: 0.2 mg/dL (ref 0.0–0.2)
Indirect Bilirubin: 0.5 mg/dL (ref 0.3–0.9)
Total Bilirubin: 0.7 mg/dL (ref 0.3–1.2)
Total Protein: 6.5 g/dL (ref 6.5–8.1)

## 2022-12-04 LAB — BASIC METABOLIC PANEL
Anion gap: 8 (ref 5–15)
BUN: 20 mg/dL (ref 8–23)
CO2: 27 mmol/L (ref 22–32)
Calcium: 8.9 mg/dL (ref 8.9–10.3)
Chloride: 104 mmol/L (ref 98–111)
Creatinine, Ser: 0.81 mg/dL (ref 0.61–1.24)
GFR, Estimated: >60 mL/min (ref >60)
Glucose, Bld: 125 mg/dL — ABNORMAL HIGH (ref 70–99)
Potassium: 4 mmol/L (ref 3.5–5.1)
Sodium: 139 mmol/L (ref 135–145)

## 2022-12-04 LAB — CBC
HCT: 52.9 % — ABNORMAL HIGH (ref 39.0–52.0)
Hemoglobin: 16.7 g/dL (ref 13.0–17.0)
MCH: 28.8 pg (ref 26.0–34.0)
MCHC: 31.6 g/dL (ref 30.0–36.0)
MCV: 91.2 fL (ref 80.0–100.0)
Platelets: 198 10*3/uL (ref 150–400)
RBC: 5.8 MIL/uL (ref 4.22–5.81)
RDW: 15.9 % — ABNORMAL HIGH (ref 11.5–15.5)
WBC: 7.5 10*3/uL (ref 4.0–10.5)
nRBC: 0 % (ref 0.0–0.2)

## 2022-12-04 LAB — URINALYSIS, COMPLETE (UACMP) WITH MICROSCOPIC
Bacteria, UA: NONE SEEN
Bilirubin Urine: NEGATIVE
Glucose, UA: >=500 mg/dL — AB
Hgb urine dipstick: NEGATIVE
Ketones, ur: NEGATIVE mg/dL
Leukocytes,Ua: NEGATIVE
Nitrite: NEGATIVE
Protein, ur: NEGATIVE mg/dL
Specific Gravity, Urine: 1.005 (ref 1.005–1.030)
pH: 5 (ref 5.0–8.0)

## 2022-12-04 LAB — BLOOD GAS, VENOUS
Acid-Base Excess: 5 mmol/L — ABNORMAL HIGH (ref 0.0–2.0)
Bicarbonate: 31 mmol/L — ABNORMAL HIGH (ref 20.0–28.0)
O2 Saturation: 25.1 %
Patient temperature: 36.8
pCO2, Ven: 50 mm[Hg] (ref 44–60)
pH, Ven: 7.4 (ref 7.25–7.43)
pO2, Ven: <31 mm[Hg] — CL (ref 32–45)

## 2022-12-04 LAB — RESP PANEL BY RT-PCR (RSV, FLU A&B, COVID)  RVPGX2
Influenza A by PCR: NEGATIVE
Influenza B by PCR: NEGATIVE
Resp Syncytial Virus by PCR: NEGATIVE
SARS Coronavirus 2 by RT PCR: NEGATIVE

## 2022-12-04 LAB — TSH: TSH: 3.475 u[IU]/mL (ref 0.350–4.500)

## 2022-12-04 LAB — PROCALCITONIN: Procalcitonin: <0.1 ng/mL

## 2022-12-04 LAB — PHOSPHORUS: Phosphorus: 3.1 mg/dL (ref 2.5–4.6)

## 2022-12-04 LAB — TROPONIN I (HIGH SENSITIVITY)
Troponin I (High Sensitivity): 28 ng/L — ABNORMAL HIGH (ref <18)
Troponin I (High Sensitivity): 24 ng/L — ABNORMAL HIGH (ref <18)

## 2022-12-04 LAB — MAGNESIUM: Magnesium: 2.1 mg/dL (ref 1.7–2.4)

## 2022-12-04 LAB — GLUCOSE, CAPILLARY: Glucose-Capillary: 101 mg/dL — ABNORMAL HIGH (ref 70–99)

## 2022-12-04 LAB — BRAIN NATRIURETIC PEPTIDE: B Natriuretic Peptide: 1061 pg/mL — ABNORMAL HIGH (ref 0.0–100.0)

## 2022-12-04 MED ORDER — FUROSEMIDE 10 MG/ML IJ SOLN
40.0000 mg | Freq: Once | INTRAMUSCULAR | Status: AC
  Administered 2022-12-04: 40 mg via INTRAVENOUS
  Filled 2022-12-04: qty 4

## 2022-12-04 MED ORDER — ACETAMINOPHEN 325 MG PO TABS: 650.0000 mg | ORAL_TABLET | Freq: Four times a day (QID) | ORAL | Status: DC | PRN

## 2022-12-04 MED ORDER — INSULIN ASPART 100 UNIT/ML IJ SOLN
0.0000 [IU] | INTRAMUSCULAR | Status: DC
  Administered 2022-12-05 (×2): 2 [IU] via SUBCUTANEOUS
  Administered 2022-12-05: 3 [IU] via SUBCUTANEOUS
  Administered 2022-12-05: 1 [IU] via SUBCUTANEOUS
  Administered 2022-12-06: 3 [IU] via SUBCUTANEOUS
  Administered 2022-12-06 (×2): 2 [IU] via SUBCUTANEOUS
  Administered 2022-12-06: 1 [IU] via SUBCUTANEOUS
  Filled 2022-12-04: qty 0.09

## 2022-12-04 MED ORDER — FUROSEMIDE 10 MG/ML IJ SOLN
40.0000 mg | Freq: Every day | INTRAMUSCULAR | Status: DC
  Administered 2022-12-05 – 2022-12-06 (×2): 40 mg via INTRAVENOUS
  Filled 2022-12-04 (×2): qty 4

## 2022-12-04 MED ORDER — ACETAMINOPHEN 650 MG RE SUPP: 650.0000 mg | Freq: Four times a day (QID) | RECTAL | Status: DC | PRN

## 2022-12-04 MED ORDER — SODIUM CHLORIDE 0.9% FLUSH
3.0000 mL | Freq: Two times a day (BID) | INTRAVENOUS | Status: DC
  Administered 2022-12-04 – 2022-12-06 (×3): 3 mL via INTRAVENOUS

## 2022-12-04 MED ORDER — ASPIRIN 81 MG PO CHEW
81.0000 mg | CHEWABLE_TABLET | Freq: Every day | ORAL | Status: DC
  Administered 2022-12-05 – 2022-12-06 (×2): 81 mg via ORAL
  Filled 2022-12-04 (×2): qty 1

## 2022-12-04 MED ORDER — SACUBITRIL-VALSARTAN 49-51 MG PO TABS
1.0000 | ORAL_TABLET | Freq: Two times a day (BID) | ORAL | Status: DC
  Administered 2022-12-04 – 2022-12-06 (×4): 1 via ORAL
  Filled 2022-12-04 (×4): qty 1

## 2022-12-04 MED ORDER — SODIUM CHLORIDE 0.9 % IV SOLN: 250.0000 mL | INTRAVENOUS | Status: DC | PRN

## 2022-12-04 MED ORDER — HYDROCODONE-ACETAMINOPHEN 5-325 MG PO TABS
1.0000 | ORAL_TABLET | ORAL | Status: DC | PRN
  Administered 2022-12-05: 1 via ORAL
  Filled 2022-12-04: qty 1

## 2022-12-04 MED ORDER — ENOXAPARIN SODIUM 40 MG/0.4ML IJ SOSY
40.0000 mg | PREFILLED_SYRINGE | INTRAMUSCULAR | Status: DC
  Administered 2022-12-04 – 2022-12-05 (×2): 40 mg via SUBCUTANEOUS
  Filled 2022-12-04 (×2): qty 0.4

## 2022-12-04 MED ORDER — SODIUM CHLORIDE 0.9% FLUSH: 3.0000 mL | INTRAVENOUS | Status: DC | PRN

## 2022-12-04 MED ORDER — CARVEDILOL 6.25 MG PO TABS
6.2500 mg | ORAL_TABLET | Freq: Two times a day (BID) | ORAL | Status: DC
  Administered 2022-12-05 – 2022-12-06 (×3): 6.25 mg via ORAL
  Filled 2022-12-04 (×3): qty 1

## 2022-12-04 MED ORDER — ONDANSETRON HCL 4 MG/2ML IJ SOLN: 4.0000 mg | Freq: Four times a day (QID) | INTRAMUSCULAR | Status: DC | PRN

## 2022-12-04 MED ORDER — ONDANSETRON HCL 4 MG PO TABS: 4.0000 mg | ORAL_TABLET | Freq: Four times a day (QID) | ORAL | Status: DC | PRN

## 2022-12-04 MED ORDER — DULOXETINE HCL 30 MG PO CPEP
30.0000 mg | ORAL_CAPSULE | Freq: Every day | ORAL | Status: DC
  Administered 2022-12-05: 30 mg via ORAL
  Filled 2022-12-04 (×2): qty 1

## 2022-12-04 NOTE — ED Triage Notes (Signed)
Pt arrived via POV. C/o chest pain, and SOB since last night. SOB better when sitting up.  AOx4

## 2022-12-04 NOTE — Assessment & Plan Note (Signed)
-   Order Sensitive  SSI     -  check TSH and HgA1C  - Hold by mouth medications*  

## 2022-12-04 NOTE — ED Notes (Signed)
ED TO INPATIENT HANDOFF REPORT  ED Nurse Name and Phone #: Lanora Manis [phone]  S Name/Age/Gender Alan Wang 72 y.o. male Room/Bed: WA09/WA09  Code Status   Code Status: Full Code  Home/SNF/Other Home Patient oriented to: self, place, time, and situation Is this baseline? Yes   Triage Complete: Triage complete  Chief Complaint Acute on chronic systolic CHF (congestive heart failure) (HCC) [I50.23]  Triage Note Pt arrived via POV. C/o chest pain, and SOB since last night. SOB better when sitting up.  AOx4   Allergies Allergies  Allergen Reactions   Avandia [Rosiglitazone Maleate] Swelling and Other (See Comments)    Lower extremities became swollen    Level of Care/Admitting Diagnosis ED Disposition     ED Disposition  Admit   Condition  --   Comment  Hospital Area: [facility] [[id number]]  Level of Care: Telemetry [5]  Admit to tele based on following criteria: Acute CHF  May place patient in observation at [facility] or [facility] if equivalent level of care is available:: No  Covid Evaluation: Asymptomatic - no recent exposure (last 10 days) testing not required  Diagnosis: Acute on chronic systolic CHF (congestive heart failure) [facility]) [[id number]]  Admitting Physician: Therisa Doyne [[id number]]  Attending Physician: Therisa Doyne [[id number]]          B Medical/Surgery History Past Medical History:  Diagnosis Date   Arthritis    Asthma    Cancer (HCC)    skin   Coronary artery disease    COVID    Diabetes mellitus    type 2, insulin dependent for 30+ years   History of kidney stones 11/2012   Hyperlipemia    Hypertension    IDDM (insulin dependent diabetes mellitus)    Shortness of breath    Stroke (HCC)    hx TIA   Past Surgical History:  Procedure Laterality Date   ANGIOPLASTY  04/06/2011   x3    CARDIAC CATHETERIZATION     CARDIAC CATHETERIZATION N/A 09/16/2015   Procedure: Left Heart Cath and Coronary Angiography;   Surgeon: Yates Decamp, MD;  Location: [facility] INVASIVE CV LAB;  Service: Cardiovascular;  Laterality: N/A;   cataracts     CHOLECYSTECTOMY     HERNIA REPAIR     x2   KNEE ARTHROSCOPY     LEFT HEART CATHETERIZATION WITH CORONARY ANGIOGRAM N/A 03/08/2011   Procedure: LEFT HEART CATHETERIZATION WITH CORONARY ANGIOGRAM;  Surgeon: Pamella Pert, MD;  Location: [facility] CATH LAB;  Service: Cardiovascular;  Laterality: N/A;   PERCUTANEOUS CORONARY STENT INTERVENTION (PCI-S) N/A 04/06/2011   Procedure: PERCUTANEOUS CORONARY STENT INTERVENTION (PCI-S);  Surgeon: Pamella Pert, MD;  Location[facility] CATH LAB;  Service: Cardiovascular;  Laterality: N/A;   TOTAL SHOULDER ARTHROPLASTY Right 01/07/2022   Procedure: TOTAL SHOULDER ARTHROPLASTY;  Surgeon: Francena Hanly, MD;  Location: [facility] ORS;  Service: Orthopedics;  Laterality: Right;      A IV Location/Drains/Wounds Patient Lines/Drains/Airways Status     Active Line/Drains/Airways     Name Placement date Placement time Site Days   Peripheral IV 12/04/22 20 G 1" Right Antecubital 12/04/22  1530  Antecubital  less than 1   External Urinary Catheter 08/21/22  1700  --  105            Intake/Output Last 24 hours No intake or output data in the 24 hours ending 12/04/22 2036  Labs/Imaging Results for orders placed or performed during the hospital encounter of 12/04/22 (from the  past 48 hour(s))  Basic metabolic panel     Status: Abnormal   Collection Time: 12/04/22  3:32 PM  Result Value Ref Range   Sodium 139 135 - 145 mmol/L   Potassium 4.0 3.5 - 5.1 mmol/L   Chloride 104 98 - 111 mmol/L   CO2 27 22 - 32 mmol/L   Glucose, Bld 125 (H) 70 - 99 mg/dL    Comment: Glucose reference range applies only to samples taken after fasting for at least 8 hours.   BUN 20 8 - 23 mg/dL   Creatinine, Ser 1.61 0.61 - 1.24 mg/dL   Calcium 8.9 8.9 - 09.6 mg/dL   GFR, Estimated >04 >54 mL/min    Comment: (NOTE) Calculated using the CKD-EPI Creatinine Equation  (2021)    Anion gap 8 5 - 15    Comment: Performed at [facility], [address] [place], [place] [address]  CBC     Status: Abnormal   Collection Time: 12/04/22  3:32 PM  Result Value Ref Range   WBC 7.5 4.0 - 10.5 K/uL   RBC 5.80 4.22 - 5.81 MIL/uL   Hemoglobin 16.7 13.0 - 17.0 g/dL   HCT 91.4 (H) 78.2 - 95.6 %   MCV 91.2 80.0 - 100.0 fL   MCH 28.8 26.0 - 34.0 pg   MCHC 31.6 30.0 - 36.0 g/dL   RDW 21.3 (H) 08.6 - 57.8 %   Platelets 198 150 - 400 K/uL   nRBC 0.0 0.0 - 0.2 %    Comment: Performed at [facility], [address] [place], [place] [address]  Troponin I (High Sensitivity)     Status: Abnormal   Collection Time: 12/04/22  3:32 PM  Result Value Ref Range   Troponin I (High Sensitivity) 28 (H) <18 ng/L    Comment: (NOTE) Elevated high sensitivity troponin I (hsTnI) values and significant  changes across serial measurements may suggest ACS but many other  chronic and acute conditions are known to elevate hsTnI results.  Refer to the "Links" section for chest pain algorithms and additional  guidance. Performed at [facility], [address] [place], [place] [address]   Brain natriuretic peptide     Status: Abnormal   Collection Time: 12/04/22  3:32 PM  Result Value Ref Range   B Natriuretic Peptide 1,061.0 (H) 0.0 - 100.0 pg/mL    Comment: Performed at [facility], [address], [place], [place] [address]  Resp panel by RT-PCR (RSV, Flu A&B, Covid) Anterior Nasal Swab     Status: None   Collection Time: 12/04/22  3:37 PM   Specimen: Anterior Nasal Swab  Result Value Ref Range   SARS Coronavirus 2 by RT PCR NEGATIVE NEGATIVE    Comment: (NOTE) SARS-CoV-2 target nucleic acids are NOT DETECTED.  The SARS-CoV-2 RNA is generally detectable in upper respiratory specimens during the acute phase of infection. The lowest concentration of SARS-CoV-2 viral copies this assay can detect  is 138 copies/mL. A negative result does not preclude SARS-Cov-2 infection and should not be used as the sole basis for treatment or other patient management decisions. A negative result may occur with  improper specimen collection/handling, submission of specimen other than nasopharyngeal swab, presence of viral mutation(s) within the areas targeted by this assay, and inadequate number of viral copies(<138 copies/mL). A negative result must be combined with clinical observations, patient history, and epidemiological information. The expected result is Negative.  Fact Sheet for  Patients:  BloggerCourse.com  Fact Sheet for Healthcare Providers:  SeriousBroker.it  This test is no t yet approved or cleared by the [place] FDA and  has been authorized for detection and/or diagnosis of SARS-CoV-2 by FDA under an Emergency Use Authorization (EUA). This EUA will remain  in effect (meaning this test can be used) for the duration of the COVID-19 declaration under Section 564(b)(1) of the Act, 21 U.S.C.section 360bbb-3(b)(1), unless the authorization is terminated  or revoked sooner.       Influenza A by PCR NEGATIVE NEGATIVE   Influenza B by PCR NEGATIVE NEGATIVE    Comment: (NOTE) The Xpert Xpress SARS-CoV-2/FLU/RSV plus assay is intended as an aid in the diagnosis of influenza from Nasopharyngeal swab specimens and should not be used as a sole basis for treatment. Nasal washings and aspirates are unacceptable for Xpert Xpress SARS-CoV-2/FLU/RSV testing.  Fact Sheet for Patients: BloggerCourse.com  Fact Sheet for Healthcare Providers: SeriousBroker.it  This test is not yet approved or cleared by the [place] FDA and has been authorized for detection and/or diagnosis of SARS-CoV-2 by FDA under an Emergency Use Authorization (EUA). This EUA will remain in effect (meaning this  test can be used) for the duration of the COVID-19 declaration under Section 564(b)(1) of the Act, 21 U.S.C. section 360bbb-3(b)(1), unless the authorization is terminated or revoked.     Resp Syncytial Virus by PCR NEGATIVE NEGATIVE    Comment: (NOTE) Fact Sheet for Patients: BloggerCourse.com  Fact Sheet for Healthcare Providers: SeriousBroker.it  This test is not yet approved or cleared by the [place] FDA and has been authorized for detection and/or diagnosis of SARS-CoV-2 by FDA under an Emergency Use Authorization (EUA). This EUA will remain in effect (meaning this test can be used) for the duration of the COVID-19 declaration under Section 564(b)(1) of the Act, 21 U.S.C. section 360bbb-3(b)(1), unless the authorization is terminated or revoked.  Performed at [facility], [address] [place], [place] [address]    DG Chest 2 View  Result Date: 12/04/2022 CLINICAL DATA:  Chest pain and shortness of breath. EXAM: CHEST - 2 VIEW COMPARISON:  Chest radiograph dated 08/21/2022. FINDINGS: The heart size and mediastinal contours are within normal limits. Both lungs are clear. Degenerative changes are seen in the spine. IMPRESSION: No active cardiopulmonary disease. Electronically Signed   By: Romona Curls M.D.   On: 12/04/2022 15:34    Pending Labs Unresulted Labs (From admission, onward)     Start     Ordered   12/04/22 2018  Hemoglobin A1c  Add-on,   AD       Comments: To assess prior glycemic control    12/04/22 2017   12/04/22 2018  Magnesium  Add-on,   AD        12/04/22 2018   12/04/22 2018  Phosphorus  Add-on,   AD        12/04/22 2018   12/04/22 2018  Procalcitonin  Add-on,   AD       References:    Procalcitonin Lower Respiratory Tract Infection AND Sepsis Procalcitonin Algorithm   12/04/22 2018   12/04/22 2018  TSH  Add-on,   AD        12/04/22 2018   12/04/22 2018  Urinalysis,  Complete w Microscopic -Urine, Clean Catch  Once,   URGENT       Question Answer Comment  Release to patient Immediate   Specimen Source Urine, Clean Catch  12/04/22 2018   12/04/22 2018  Hepatic function panel  Add-on,   AD       Question:  Release to patient  Answer:  Immediate   12/04/22 2018   Signed and Held  Magnesium  Tomorrow morning,   R        Signed and Held   Signed and Held  Phosphorus  Tomorrow morning,   R        Signed and Held   Signed and Held  Comprehensive metabolic panel  Tomorrow morning,   R       Question:  Release to patient  Answer:  Immediate   Signed and Held   Signed and Held  CBC  Tomorrow morning,   R       Question:  Release to patient  Answer:  Immediate   Signed and Held            Vitals/Pain Today's Vitals   12/04/22 1700 12/04/22 1715 12/04/22 1730 12/04/22 2032  BP: (!) 138/99 (!) 141/92 (!) 148/104   Pulse: 83 84 81   Resp: 11 15 12    Temp:      TempSrc:      SpO2: 97% 97% 96% 96%  Weight:      Height:      PainSc:        Isolation Precautions No active isolations  Medications Medications  insulin aspart (novoLOG) injection 0-9 Units (has no administration in time range)  furosemide (LASIX) injection 40 mg (40 mg Intravenous Given 12/04/22 1821)    Mobility walks     Focused Assessments Cardiac Assessment Handoff:    Lab Results  Component Value Date   CKTOTAL 84 07/10/2020   CKMB 3.0 05/16/2008   TROPONINI (H) 05/16/2008    0.14        PERSISTENTLY INCREASED TROPONIN VALUES IN THE RANGE OF 0.06-0.49 ng/mL CAN BE SEEN IN:       -UNSTABLE ANGINA       -CONGESTIVE HEART FAILURE       -MYOCARDITIS       -CHEST TRAUMA       -ARRYHTHMIAS       -LATE PRESENTING MI       -COPD   CLINICAL FOLLOW-UP RECOMMENDED.   No results found for: "DDIMER" Does the Patient currently have chest pain? No    R Recommendations: See Admitting Provider Note  Report given to:   Additional Notes: a & o x 4

## 2022-12-04 NOTE — Assessment & Plan Note (Signed)
-   Pt diagnosed with CHF based on presence of the following:  PND, OA  bilateral leg edema,    With noted response to IV diuretic in ER  admit on telemetry,  cycle cardiac enzymes, Troponin 28    obtain serial ECG  to evaluate for ischemia as a cause of heart failure  monitor daily weight:  Filed Weights   12/04/22 1502  Weight: 90.7 kg   Last BNP BNP (last 3 results) Recent Labs    08/21/22 2107 12/04/22 1532  BNP 1,034.1* 1,061.0*       diurese with IV lasix 40 q day and monitor orthostatics and creatinine to avoid over diuresis.  Order echogram to evaluate EF and valves     cardiology consulted

## 2022-12-04 NOTE — H&P (Signed)
Alan Wang ZOX:[id number] DOB: Jul 12, 1950 DOA: 12/04/2022     PCP: Henrine Screws, MD   Outpatient Specialists:   CARDS:  Dr. Jacinto Halim   Patient arrived to ER on 12/04/22 at 1449 Referred by Attending Glyn Ade, MD   Patient coming from:    home Lives  With family    Chief Complaint:   Chief Complaint  Patient presents with   Chest Pain   Shortness of Breath    HPI: Alan Wang is a 72 y.o. male with medical history significant of hypertension HLD DM2 COVID in June 2024, systolic CHF last EF 25-40% with global hypokinesis    Presented with worsening shortness of breath Presents with shortness of breath and chest pain for past 2 days increased congestion worse today Shortness of breath is better when he sits up  Supposed to be on LAsix but stopped taking it, has been eating out Notice increased SOB that is significantly  He does not know what he takes Reports have been ustable for the past 6 m had an MRI in [place] showed old cva Denies significant ETOH intake   Does not smoke   Lab Results  Component Value Date   SARSCOV2NAA NEGATIVE 12/04/2022   SARSCOV2NAA POSITIVE (A) 08/21/2022       Regarding pertinent Chronic problems:      HTN on Coreg   chronic CHF systolic - last echo   On Lasix Entresto    Recent Results (from the past 81191 hour(s))  ECHOCARDIOGRAM COMPLETE   Collection Time: 08/23/22  8:49 AM  Result Value   Weight 3,192.26   Height 70   BP 152/94   S' Lateral 5.10   Area-P 1/2 4.93   MV M vel 1.88   MV Peak grad 14.1   Est EF 25 - 30%   Narrative      ECHOCARDIOGRAM REPORT      . IMPRESSIONS    1. Left ventricular ejection fraction, by estimation, is 25 to 30%. The left ventricle has severely decreased function. The left ventricle demonstrates global hypokinesis. Left ventricular diastolic parameters are indeterminate.  2. Right ventricular systolic function is mildly reduced. The right ventricular size is normal.  3. Left  atrial size was mildly dilated.  4. Right atrial size was mildly dilated.  5. The mitral valve is normal in structure. Mild mitral valve regurgitation. No evidence of mitral stenosis.  6. The aortic valve is tricuspid. There is mild calcification of the aortic valve. Aortic valve regurgitation is not visualized. No aortic stenosis is present.  7. The inferior vena cava is dilated in size with >50% respiratory variability, suggesting right atrial pressure of 8 mmHg.           CAD  - On Aspirin, betablocker,                  -  followed by cardiology                   DM 2 -  Lab Results  Component Value Date   HGBA1C 6.8 (H) 12/29/2021    on insulin, Jardiance    While in ER:    Respoded to IV lasix No need for O2, on RA    Lab Orders         Resp panel by RT-PCR (RSV, Flu A&B, Covid) Anterior Nasal Swab         Basic metabolic panel  CBC         Brain natriuretic peptide      CXR -  NON acute  Following Medications were ordered in ER: Medications  furosemide (LASIX) injection 40 mg (40 mg Intravenous Given 12/04/22 1821)       ED Triage Vitals  Encounter Vitals Group     BP 12/04/22 1459 (!) 149/103     Systolic BP Percentile --      Diastolic BP Percentile --      Pulse Rate 12/04/22 1459 89     Resp 12/04/22 1459 18     Temp 12/04/22 1459 98.8 F (37.1 C)     Temp Source 12/04/22 1459 Oral     SpO2 12/04/22 1459 98 %     Weight 12/04/22 1502 200 lb (90.7 kg)     Height 12/04/22 1502 5\' 10"  (1.778 m)     Head Circumference --      Peak Flow --      Pain Score 12/04/22 1502 0     Pain Loc --      Pain Education --      Exclude from Growth Chart --   ZOXW(96)@     _________________________________________ Significant initial  Findings: Abnormal Labs Reviewed  BASIC METABOLIC PANEL - Abnormal; Notable for the following components:      Result Value   Glucose, Bld 125 (*)    All other components within normal limits  CBC - Abnormal; Notable for the  following components:   HCT 52.9 (*)    RDW 15.9 (*)    All other components within normal limits  BRAIN NATRIURETIC PEPTIDE - Abnormal; Notable for the following components:   B Natriuretic Peptide 1,061.0 (*)    All other components within normal limits  TROPONIN I (HIGH SENSITIVITY) - Abnormal; Notable for the following components:   Troponin I (High Sensitivity) 28 (*)    All other components within normal limits      _________________________ Troponin  ordered Cardiac Panel (last 3 results) Recent Labs    12/04/22 1532  TROPONINIHS 28*     ECG: Ordered Personally reviewed and interpreted by me showing: HR : 89 Rhythm: Sinus rhythm Multiform ventricular premature complexes Low voltage, extremity leads Nonspecific T abnormalities, lateral leads QTC 454  BNP (last 3 results) Recent Labs    08/21/22 2107 12/04/22 1532  BNP 1,034.1* 1,061.0*     COVID-19 Labs  No results for input(s): "DDIMER", "FERRITIN", "LDH", "CRP" in the last 72 hours.  Lab Results  Component Value Date   SARSCOV2NAA NEGATIVE 12/04/2022   SARSCOV2NAA POSITIVE (A) 08/21/2022    ___________  The recent clinical data is shown below. Vitals:   12/04/22 1645 12/04/22 1700 12/04/22 1715 12/04/22 1730  BP: (!) 147/91 (!) 138/99 (!) 141/92 (!) 148/104  Pulse: 79 83 84 81  Resp: 11 11 15 12   Temp:      TempSrc:      SpO2: 98% 97% 97% 96%  Weight:      Height:        WBC     Component Value Date/Time   WBC 7.5 12/04/2022 1532   LYMPHSABS 3.1 08/28/2022 0653   LYMPHSABS 2.4 09/22/2015 1525   MONOABS 1.2 (H) 08/28/2022 0653   MONOABS 0.8 09/22/2015 1525   EOSABS 0.2 08/28/2022 0653   EOSABS 0.1 09/22/2015 1525   BASOSABS 0.1 08/28/2022 0653   BASOSABS 0.0 09/22/2015 1525       UA  ordered  Results for orders placed or performed during the hospital encounter of 12/04/22  Resp panel by RT-PCR (RSV, Flu A&B, Covid) Anterior Nasal Swab     Status: None   Collection Time:  12/04/22  3:37 PM   Specimen: Anterior Nasal Swab  Result Value Ref Range Status   SARS Coronavirus 2 by RT PCR NEGATIVE NEGATIVE Final         Influenza A by PCR NEGATIVE NEGATIVE Final   Influenza B by PCR NEGATIVE NEGATIVE Final         Resp Syncytial Virus by PCR NEGATIVE NEGATIVE Final              __________________________________________________________ Recent Labs  Lab 12/04/22 1532  NA 139  K 4.0  CO2 27  GLUCOSE 125*  BUN 20  CREATININE 0.81  CALCIUM 8.9    Cr   stable,   Lab Results  Component Value Date   CREATININE 0.81 12/04/2022   CREATININE 0.82 08/28/2022   CREATININE 0.70 08/26/2022    No results for input(s): "AST", "ALT", "ALKPHOS", "BILITOT", "PROT", "ALBUMIN" in the last 168 hours. Lab Results  Component Value Date   CALCIUM 8.9 12/04/2022    Plt: Lab Results  Component Value Date   PLT 198 12/04/2022       Recent Labs  Lab 12/04/22 1532  WBC 7.5  HGB 16.7  HCT 52.9*  MCV 91.2  PLT 198    HG/HCT stable,      Component Value Date/Time   HGB 16.7 12/04/2022 1532   HGB 15.9 09/22/2015 1525   HCT 52.9 (H) 12/04/2022 1532   HCT 46.4 09/22/2015 1525   MCV 91.2 12/04/2022 1532   MCV 92.1 09/22/2015 1525    _______________________________________________ Hospitalist was called for admission for acute systolic CHF   The following Work up has been ordered so far:  Orders Placed This Encounter  Procedures   Resp panel by RT-PCR (RSV, Flu A&B, Covid) Anterior Nasal Swab   DG Chest 2 View   Basic metabolic panel   CBC   Brain natriuretic peptide   Consult to hospitalist   EKG 12-Lead   ED EKG     OTHER Significant initial  Findings:  labs showing:     DM  labs:  HbA1C: Recent Labs    12/29/21 1456  HGBA1C 6.8*      CBG (last 3)  No results for input(s): "GLUCAP" in the last 72 hours.        Cultures:    Component Value Date/Time   SDES  08/21/2022 1458    BLOOD BLOOD RIGHT ARM Performed at [facility], [address] [place], [place] [address]    [facility]  08/21/2022 1458    BOTTLES DRAWN AEROBIC AND ANAEROBIC Blood Culture adequate volume Performed at [facility], [address] [place], [place] [address]    CULT  08/21/2022 1458    NO GROWTH 5 DAYS Performed at [facility], [address] [place], [place] [address]    REPTSTATUS 08/26/2022 FINAL 08/21/2022 1458     Radiological Exams on Admission: DG Chest 2 View  Result Date: 12/04/2022 CLINICAL DATA:  Chest pain and shortness of breath. EXAM: CHEST - 2 VIEW COMPARISON:  Chest radiograph dated 08/21/2022. FINDINGS: The heart size and mediastinal contours are within normal limits. Both lungs are clear. Degenerative changes are seen in the spine. IMPRESSION: No active cardiopulmonary disease. Electronically Signed   By: Foye Spurling.D.  On: 12/04/2022 15:34   _______________________________________________________________________________________________________ Latest  Blood pressure (!) 148/104, pulse 81, temperature 98.8 F (37.1 C), temperature source Oral, resp. rate 12, height 5\' 10"  (1.778 m), weight 90.7 kg, SpO2 96%.   Vitals  labs and radiology finding personally reviewed  Review of Systems:    Pertinent positives include:   shortness of breath at rest.   dyspnea on exertion,gait abnormality, Bilateral lower extremity swelling   Constitutional:  No weight loss, night sweats, Fevers, chills, fatigue, weight loss  HEENT:  No headaches, Difficulty swallowing,Tooth/dental problems,Sore throat,  No sneezing, itching, ear ache, nasal congestion, post nasal drip,  Cardio-vascular:  No chest pain, Orthopnea, PND, anasarca, dizziness, palpitations.no  GI:  No heartburn, indigestion, abdominal pain, nausea, vomiting, diarrhea, change in bowel habits, loss of appetite, melena, blood in stool, hematemesis Resp:  no No excess mucus, no productive cough, No  non-productive cough, No coughing up of blood.No change in color of mucus.No wheezing. Skin:  no rash or lesions. No jaundice GU:  no dysuria, change in color of urine, no urgency or frequency. No straining to urinate.  No flank pain.  Musculoskeletal:  No joint pain or no joint swelling. No decreased range of motion. No back pain.  Psych:  No change in mood or affect. No depression or anxiety. No memory loss.  Neuro: no localizing neurological complaints, no tingling, no weakness, no double vision, no no slurred speech, no confusion  All systems reviewed and apart from HOPI all are negative _______________________________________________________________________________________________ Past Medical History:   Past Medical History:  Diagnosis Date   Arthritis    Asthma    Cancer (HCC)    skin   Coronary artery disease    COVID    Diabetes mellitus    type 2, insulin dependent for 30+ years   History of kidney stones 11/2012   Hyperlipemia    Hypertension    IDDM (insulin dependent diabetes mellitus)    Shortness of breath    Stroke (HCC)    hx TIA      Past Surgical History:  Procedure Laterality Date   ANGIOPLASTY  04/06/2011   x3    CARDIAC CATHETERIZATION     CARDIAC CATHETERIZATION N/A 09/16/2015   Procedure: Left Heart Cath and Coronary Angiography;  Surgeon: Yates Decamp, MD;  Location: [facility] INVASIVE CV LAB;  Service: Cardiovascular;  Laterality: N/A;   cataracts     CHOLECYSTECTOMY     HERNIA REPAIR     x2   KNEE ARTHROSCOPY     LEFT HEART CATHETERIZATION WITH CORONARY ANGIOGRAM N/A 03/08/2011   Procedure: LEFT HEART CATHETERIZATION WITH CORONARY ANGIOGRAM;  Surgeon: Pamella Pert, MD;  Location: [facility] CATH LAB;  Service: Cardiovascular;  Laterality: N/A;   PERCUTANEOUS CORONARY STENT INTERVENTION (PCI-S) N/A 04/06/2011   Procedure: PERCUTANEOUS CORONARY STENT INTERVENTION (PCI-S);  Surgeon: Pamella Pert, MD;  Location: [facility] CATH LAB;  Service: Cardiovascular;   Laterality: N/A;   TOTAL SHOULDER ARTHROPLASTY Right 01/07/2022   Procedure: TOTAL SHOULDER ARTHROPLASTY;  Surgeon: Francena Hanly, MD;  Location: [facility] ORS;  Service: Orthopedics;  Laterality: Right;     Social History:  Ambulatory   independently      reports that he has never smoked. He has never used smokeless tobacco. He reports current alcohol use. He reports that he does not use drugs.   Family History:   Family History  Problem Relation Age of Onset   Emphysema Mother    COPD Mother    Alzheimer's disease  Father    Asthma Paternal Aunt    Heart attack Paternal Grandfather    ______________________________________________________________________________________________ Allergies: Allergies  Allergen Reactions   Avandia [Rosiglitazone Maleate] Swelling and Other (See Comments)    Lower extremities became swollen     Prior to Admission medications   Medication Sig Start Date End Date Taking? Authorizing Provider  acetaminophen (TYLENOL) 650 MG CR tablet Take 1,300 mg by mouth 2 (two) times daily.    [provider]  Ascorbic Acid (VITAMIN C) 1000 MG tablet Take 1,000 mg by mouth daily.    [provider]  aspirin (ASPIRIN CHILDRENS) 81 MG chewable tablet Chew 1 tablet (81 mg total) by mouth daily. 11/09/22   Yates Decamp, MD  carvedilol (COREG) 6.25 MG tablet Take 1 tablet (6.25 mg total) by mouth 2 (two) times daily with a meal. 08/29/22 11/09/22  Briant Cedar, MD  celecoxib (CELEBREX) 200 MG capsule Take 200 mg by mouth daily.    [provider]  chlorpheniramine (CHLOR-TRIMETON) 4 MG tablet Take 4 mg by mouth at bedtime.    [provider]  Cholecalciferol (VITAMIN D) 2000 units CAPS Take 2,000 Units by mouth daily.    [provider]  CINNAMON PO Take 2,000 mg by mouth 2 (two) times daily.    [provider]  DULoxetine (CYMBALTA) 30 MG capsule Take 30 mg by mouth at bedtime.    [provider]   furosemide (LASIX) 40 MG tablet Take 1 tablet (40 mg total) by mouth daily as needed for fluid or edema. 11/09/22   Yates Decamp, MD  insulin NPH (HUMULIN N,NOVOLIN N) 100 UNIT/ML injection Inject 10-35 Units into the skin See admin instructions. Inject 10 units into the skin before breakfast and 35 units before supper    [provider]  insulin regular (NOVOLIN R,HUMULIN R) 100 units/mL injection Inject 20-25 Units into the skin See admin instructions. Inject 20 units into the skin before breakfast and 25 units before supper    [provider]  JARDIANCE 10 MG TABS tablet Take 10 mg by mouth daily. 10/28/22   [provider]  Magnesium 500 MG TABS Take 500 mg by mouth daily.    [provider]  MOUNJARO 5 MG/0.5ML Pen 5mg  Subcutaneous once a week    [provider]  nitroGLYCERIN (NITROSTAT) 0.4 MG SL tablet Place 1 tablet (0.4 mg total) under the tongue every 5 (five) minutes as needed for chest pain. 08/29/22 09/28/22  Briant Cedar, MD  ondansetron (ZOFRAN) 4 MG tablet Take 1 tablet (4 mg total) by mouth every 8 (eight) hours as needed for nausea or vomiting. 01/07/22   Shuford, French Ana, PA-C  ONETOUCH VERIO test strip USE TO TEST BLOOD SUGAR 4 TIMES DAILY 04/17/19   [provider]  REPATHA SURECLICK 140 MG/ML SOAJ Inject 140 mg as directed every 14 (fourteen) days. 05/31/22   [provider]  traMADol (ULTRAM) 50 MG tablet Take 1 tablet (50 mg total) by mouth every 6 (six) hours as needed for moderate pain. Patient taking differently: Take 100 mg by mouth every 6 (six) hours as needed for moderate pain. 01/07/22   Shuford, French Ana, PA-C  TURMERIC PO Take 1 capsule by mouth daily.    [provider]    ___________________________________________________________________________________________________ Physical Exam:    12/04/2022    5:30 PM 12/04/2022    5:15 PM 12/04/2022    5:00 PM  Vitals with BMI  Systolic 148 141 130   Diastolic  104 92 99  Pulse 81 84 83     1. General:  in No  Acute distress   Chronically ill-appearing 2. Psychological: Alert and   Oriented 3. Head/ENT:   Moist  Mucous Membranes                          Head Non traumatic, neck supple                           Poor Dentition 4. SKIN: normal  Skin turgor,  Skin clean Dry and intact no rash    5. Heart: Regular rate and rhythm no  Murmur, no Rub or gallop 6. Lungs:  no wheezes or crackles   7. Abdomen: Soft,  non-tender, Non distended bowel sounds present 8. Lower extremities: no clubbing, cyanosis,  edema 9. Neurologically Grossly intact, moving all 4 extremities equally  10. MSK: Normal range of motion    Chart has been reviewed  ______________________________________________________________________________________________  Assessment/Plan  72 y.o. male with medical history significant of hypertension HLD DM2 COVID in June 2024, systolic CHF last EF 25-40% with global hypokinesis  Admitted for systolic CHF   Present on Admission:  Acute on chronic systolic CHF (congestive heart failure) (HCC)  CAD (coronary artery disease), native coronary artery  HTN (hypertension)  Elevated troponin     Acute on chronic systolic CHF (congestive heart failure) (HCC) - Pt diagnosed with CHF based on presence of the following:  PND, OA  bilateral leg edema,    With noted response to IV diuretic in ER  admit on telemetry,  cycle cardiac enzymes, Troponin 28    obtain serial ECG  to evaluate for ischemia as a cause of heart failure  monitor daily weight:  Filed Weights   12/04/22 1502  Weight: 90.7 kg   Last BNP BNP (last 3 results) Recent Labs    08/21/22 2107 12/04/22 1532  BNP 1,034.1* 1,061.0*       diurese with IV lasix 40 q day and monitor orthostatics and creatinine to avoid over diuresis.  Order echogram to evaluate EF and valves     cardiology consulted    CAD (coronary artery disease), native coronary  artery Cont aspirin  Type II or unspecified type diabetes mellitus with other coma, not stated as uncontrolled  - Order Sensitive SSI    -  check TSH and HgA1C  - Hold by mouth medications    HTN (hypertension) Cont Coreg if BP allows, Entresto if BP allows  Elevated troponin In the setting of CHF cont to cycle CE Echo in AM   Other plan as per orders.  DVT prophylaxis:   Lovenox      Code Status:    Code Status: Prior FULL CODE   as per patient   I had personally discussed CODE STATUS with patient   ACP   none    Family Communication:   Family not at  Bedside    Diet  heart healthy daibetic    Disposition Plan:         To home once workup is complete and patient is stable   Following barriers for discharge:  Electrolytes corrected                                                           Will need consultants to evaluate patient prior to discharge       Consult Orders  (From admission, onward)           Start     Ordered   12/04/22 1928  Consult to hospitalist  Once       Provider:  (Not yet assigned)  Question Answer Comment  Place call to: [facility]   Reason for Consult Admit      12/04/22 1927                               Would benefit from PT/OT eval prior to DC  Ordered                     Consults called:    will nee to let cardiology know in AM   Admission status:  ED Disposition     ED Disposition  Admit   Condition  --   Comment  Hospital Area: [facility] [[id number]]  Level of Care: Telemetry [5]  Admit to tele based on following criteria: Acute CHF  May place patient in observation at [facility] or [facility] if equivalent level of care is available:: No  Covid Evaluation: Asymptomatic - no recent exposure (last 10 days) testing not required  Diagnosis: Acute on chronic systolic CHF (congestive heart failure) [facility]) [[id number]]  Admitting Physician:  Therisa Doyne [[id number]]  Attending Physician: Therisa Doyne [[id number]]          Obs      Level of care     tele  For  24H     Lab Results  Component Value Date   SARSCOV2NAA NEGATIVE 12/04/2022     Precautions: admitted as   Covid Negative      Morgen Ritacco 12/04/2022, 8:50 PM    [facility]     after 2 AM please page floor coverage PA If 7AM-7PM, please contact the day team taking care of the patient using [email or web address]

## 2022-12-04 NOTE — Assessment & Plan Note (Signed)
-  Cont aspirin

## 2022-12-04 NOTE — Subjective & Objective (Signed)
Presents with shortness of breath and chest pain for past 2 days increased congestion worse today Shortness of breath is better when he sits up

## 2022-12-04 NOTE — Assessment & Plan Note (Signed)
In the setting of CHF cont to cycle CE Echo in AM

## 2022-12-04 NOTE — Assessment & Plan Note (Signed)
Cont Coreg if BP allows, Entresto if BP allows

## 2022-12-04 NOTE — ED Provider Notes (Signed)
Alan Wang EMERGENCY DEPARTMENT AT [facility] Provider Note   CSN: [id number] Arrival date & time: 12/04/22  1449     History Chief Complaint  Patient presents with   Chest Pain   Shortness of Breath    HPI Alan Wang is a 72 y.o. male presenting for shortness of breath and chest pain over the past 2 days.  He endorses more of a history of congestion and severe cough with poor p.o. intake less so active chest pain but states he felt it more severely this morning.. 72 year old male with a medical history of CAD, CAD status post coronary angioplasty, diabetes, hypertension, hyperlipidemia, TIA, heart failure.  On aspirin, carvedilol, Lasix, insulin, Jardiance, Mounjaro, tramadol as needed.  Patient's recorded medical, surgical, social, medication list and allergies were reviewed in the Snapshot window as part of the initial history.   Review of Systems   Review of Systems  Constitutional:  Positive for fatigue. Negative for chills and fever.  HENT:  Negative for ear pain and sore throat.   Eyes:  Negative for pain and visual disturbance.  Respiratory:  Positive for cough and shortness of breath.   Cardiovascular:  Negative for chest pain and palpitations.  Gastrointestinal:  Negative for abdominal pain and vomiting.  Genitourinary:  Negative for dysuria and hematuria.  Musculoskeletal:  Negative for arthralgias and back pain.  Skin:  Negative for color change and rash.  Neurological:  Positive for weakness. Negative for seizures and syncope.  All other systems reviewed and are negative.   Physical Exam Updated Vital Signs BP (!) 188/105 (BP Location: Left Arm)   Pulse 74   Temp 98.2 F (36.8 C) (Oral)   Resp 20   Ht 5' 10.5" (1.791 m)   Wt 89.4 kg   SpO2 98%   BMI 27.87 kg/m  Physical Exam Vitals and nursing note reviewed.  Constitutional:      General: He is not in acute distress.    Appearance: He is well-developed.  HENT:     Head: Normocephalic  and atraumatic.  Eyes:     Conjunctiva/sclera: Conjunctivae normal.  Cardiovascular:     Rate and Rhythm: Normal rate and regular rhythm.     Heart sounds: No murmur heard. Pulmonary:     Effort: Respiratory distress present.     Breath sounds: Normal breath sounds.  Abdominal:     Palpations: Abdomen is soft.     Tenderness: There is no abdominal tenderness.  Musculoskeletal:        General: No swelling.     Cervical back: Neck supple.     Right lower leg: Edema present.     Left lower leg: Edema present.  Skin:    General: Skin is warm and dry.     Capillary Refill: Capillary refill takes less than 2 seconds.  Neurological:     Mental Status: He is alert.  Psychiatric:        Mood and Affect: Mood normal.      ED Course/ Medical Decision Making/ A&P    Procedures Procedures   Medications Ordered in ED Medications  insulin aspart (novoLOG) injection 0-9 Units ( Subcutaneous Not Given 12/04/22 2233)  aspirin chewable tablet 81 mg (has no administration in time range)  carvedilol (COREG) tablet 6.25 mg (has no administration in time range)  DULoxetine (CYMBALTA) DR capsule 30 mg (has no administration in time range)  sacubitril-valsartan (ENTRESTO) 49-51 mg per tablet (has no administration in time range)  acetaminophen (TYLENOL)  tablet 650 mg (has no administration in time range)    Or  acetaminophen (TYLENOL) suppository 650 mg (has no administration in time range)  HYDROcodone-acetaminophen (NORCO/VICODIN) 5-325 MG per tablet 1-2 tablet (has no administration in time range)  ondansetron (ZOFRAN) tablet 4 mg (has no administration in time range)    Or  ondansetron (ZOFRAN) injection 4 mg (has no administration in time range)  furosemide (LASIX) injection 40 mg (has no administration in time range)  enoxaparin (LOVENOX) injection 40 mg (has no administration in time range)  sodium chloride flush (NS) 0.9 % injection 3 mL (has no administration in time range)  sodium  chloride flush (NS) 0.9 % injection 3 mL (has no administration in time range)  0.9 %  sodium chloride infusion (has no administration in time range)  furosemide (LASIX) injection 40 mg (40 mg Intravenous Given 12/04/22 1821)    Medical Decision Making:    Alan Wang is a 72 y.o. male who presented to the ED today with shortness of breath detailed above.     Complete initial physical exam performed, notably the patient  was hemodynamically stable no acute distress.  Very tachypneic on ambulation trial and profound pedal edema.      Reviewed and confirmed nursing documentation for past medical history, family history, social history.    Initial Assessment:   With the patient's presentation of shortness of breath and the above exam, most likely diagnosis is heart failure exacerbation. Other diagnoses were considered including (but not limited to) ACS, pulmonary embolism, pneumonia, pneumothorax. These are considered less likely due to history of present illness and physical exam findings.   This is most consistent with an acute life/limb threatening illness complicated by underlying chronic conditions.  Initial Plan:  Screening labs including CBC and Metabolic panel to evaluate for infectious or metabolic etiology of disease.  Urinalysis with reflex culture ordered to evaluate for UTI or relevant urologic/nephrologic pathology.  CXR to evaluate for structural/infectious intrathoracic pathology.  BNP/troponin/EKG to evaluate for cardiac pathology. Objective evaluation as below reviewed with plan for close reassessment  Initial Study Results:   Laboratory  All laboratory results reviewed without evidence of clinically relevant pathology.   Exceptions include: BNP elevation, troponin elevation  EKG EKG was reviewed independently. Rate, rhythm, axis, intervals all examined and without medically relevant abnormality. ST segments without concerns for elevations.    Radiology  All images  reviewed independently. Agree with radiology report at this time.   DG Chest 2 View  Result Date: 12/04/2022 CLINICAL DATA:  Chest pain and shortness of breath. EXAM: CHEST - 2 VIEW COMPARISON:  Chest radiograph dated 08/21/2022. FINDINGS: The heart size and mediastinal contours are within normal limits. Both lungs are clear. Degenerative changes are seen in the spine. IMPRESSION: No active cardiopulmonary disease. Electronically Signed   By: Romona Curls M.D.   On: 12/04/2022 15:34     Consults:  Case discussed with hospitalist.   Reassessment and Plan:   Based on patient's findings, he is having a heart failure exacerbation.  Will diurese with Lasix double his home dose and plan for serial reassessments.  I consulted Dr. Adela Glimpse who agreed with need for admission for further care and management based on presentation today.   Disposition:   Based on the above findings, I believe this patient is stable for admission.    Patient/family educated about specific findings on our evaluation and explained exact reasons for admission.  Patient/family educated about clinical situation and time was  allowed to answer questions.   Admission team communicated with and agreed with need for admission. Patient admitted. Patient ready to move at this time.     Emergency Department Medication Summary:   Medications  insulin aspart (novoLOG) injection 0-9 Units ( Subcutaneous Not Given 12/04/22 2233)  aspirin chewable tablet 81 mg (has no administration in time range)  carvedilol (COREG) tablet 6.25 mg (has no administration in time range)  DULoxetine (CYMBALTA) DR capsule 30 mg (has no administration in time range)  sacubitril-valsartan (ENTRESTO) 49-51 mg per tablet (has no administration in time range)  acetaminophen (TYLENOL) tablet 650 mg (has no administration in time range)    Or  acetaminophen (TYLENOL) suppository 650 mg (has no administration in time range)  HYDROcodone-acetaminophen  (NORCO/VICODIN) 5-325 MG per tablet 1-2 tablet (has no administration in time range)  ondansetron (ZOFRAN) tablet 4 mg (has no administration in time range)    Or  ondansetron (ZOFRAN) injection 4 mg (has no administration in time range)  furosemide (LASIX) injection 40 mg (has no administration in time range)  enoxaparin (LOVENOX) injection 40 mg (has no administration in time range)  sodium chloride flush (NS) 0.9 % injection 3 mL (has no administration in time range)  sodium chloride flush (NS) 0.9 % injection 3 mL (has no administration in time range)  0.9 %  sodium chloride infusion (has no administration in time range)  furosemide (LASIX) injection 40 mg (40 mg Intravenous Given 12/04/22 1821)         Clinical Impression:  1. Chronic systolic heart failure (HCC)      Admit   Final Clinical Impression(s) / ED Diagnoses Final diagnoses:  Chronic systolic heart failure [facility])    Rx / DC Orders ED Discharge Orders     None         Glyn Ade, MD 12/04/22 2245

## 2022-12-05 ENCOUNTER — Other Ambulatory Visit (HOSPITAL_COMMUNITY): Payer: Medicare HMO

## 2022-12-05 ENCOUNTER — Observation Stay (HOSPITAL_BASED_OUTPATIENT_CLINIC_OR_DEPARTMENT_OTHER): Payer: Medicare HMO

## 2022-12-05 DIAGNOSIS — I5033 Acute on chronic diastolic (congestive) heart failure: Secondary | ICD-10-CM | POA: Diagnosis not present

## 2022-12-05 DIAGNOSIS — E119 Type 2 diabetes mellitus without complications: Secondary | ICD-10-CM | POA: Diagnosis not present

## 2022-12-05 DIAGNOSIS — I110 Hypertensive heart disease with heart failure: Secondary | ICD-10-CM | POA: Diagnosis not present

## 2022-12-05 DIAGNOSIS — I5023 Acute on chronic systolic (congestive) heart failure: Secondary | ICD-10-CM

## 2022-12-05 DIAGNOSIS — I2510 Atherosclerotic heart disease of native coronary artery without angina pectoris: Secondary | ICD-10-CM | POA: Diagnosis not present

## 2022-12-05 DIAGNOSIS — Z8673 Personal history of transient ischemic attack (TIA), and cerebral infarction without residual deficits: Secondary | ICD-10-CM | POA: Diagnosis not present

## 2022-12-05 DIAGNOSIS — J45909 Unspecified asthma, uncomplicated: Secondary | ICD-10-CM | POA: Diagnosis not present

## 2022-12-05 DIAGNOSIS — R778 Other specified abnormalities of plasma proteins: Secondary | ICD-10-CM | POA: Diagnosis not present

## 2022-12-05 DIAGNOSIS — Z7982 Long term (current) use of aspirin: Secondary | ICD-10-CM | POA: Diagnosis not present

## 2022-12-05 DIAGNOSIS — Z85828 Personal history of other malignant neoplasm of skin: Secondary | ICD-10-CM | POA: Diagnosis not present

## 2022-12-05 DIAGNOSIS — Z1152 Encounter for screening for COVID-19: Secondary | ICD-10-CM | POA: Diagnosis not present

## 2022-12-05 DIAGNOSIS — Z8616 Personal history of COVID-19: Secondary | ICD-10-CM | POA: Diagnosis not present

## 2022-12-05 LAB — ECHOCARDIOGRAM LIMITED
Area-P 1/2: 4.06 cm2
Height: 70.5 in
S' Lateral: 4.3 cm
Weight: 3135.82 [oz_av]

## 2022-12-05 LAB — COMPREHENSIVE METABOLIC PANEL
ALT: 15 U/L (ref 0–44)
AST: 17 U/L (ref 15–41)
Albumin: 3.6 g/dL (ref 3.5–5.0)
Alkaline Phosphatase: 61 U/L (ref 38–126)
Anion gap: 13 (ref 5–15)
BUN: 17 mg/dL (ref 8–23)
CO2: 24 mmol/L (ref 22–32)
Calcium: 8.7 mg/dL — ABNORMAL LOW (ref 8.9–10.3)
Chloride: 106 mmol/L (ref 98–111)
Creatinine, Ser: 0.68 mg/dL (ref 0.61–1.24)
GFR, Estimated: >60 mL/min (ref >60)
Glucose, Bld: 102 mg/dL — ABNORMAL HIGH (ref 70–99)
Potassium: 3.3 mmol/L — ABNORMAL LOW (ref 3.5–5.1)
Sodium: 143 mmol/L (ref 135–145)
Total Bilirubin: 1.2 mg/dL (ref 0.3–1.2)
Total Protein: 6.5 g/dL (ref 6.5–8.1)

## 2022-12-05 LAB — PHOSPHORUS: Phosphorus: 3.2 mg/dL (ref 2.5–4.6)

## 2022-12-05 LAB — CBC
HCT: 50 % (ref 39.0–52.0)
Hemoglobin: 15.8 g/dL (ref 13.0–17.0)
MCH: 29.4 pg (ref 26.0–34.0)
MCHC: 31.6 g/dL (ref 30.0–36.0)
MCV: 92.9 fL (ref 80.0–100.0)
Platelets: 171 10*3/uL (ref 150–400)
RBC: 5.38 MIL/uL (ref 4.22–5.81)
RDW: 15.4 % (ref 11.5–15.5)
WBC: 7.5 10*3/uL (ref 4.0–10.5)
nRBC: 0 % (ref 0.0–0.2)

## 2022-12-05 LAB — GLUCOSE, CAPILLARY
Glucose-Capillary: 116 mg/dL — ABNORMAL HIGH (ref 70–99)
Glucose-Capillary: 145 mg/dL — ABNORMAL HIGH (ref 70–99)
Glucose-Capillary: 149 mg/dL — ABNORMAL HIGH (ref 70–99)
Glucose-Capillary: 156 mg/dL — ABNORMAL HIGH (ref 70–99)
Glucose-Capillary: 171 mg/dL — ABNORMAL HIGH (ref 70–99)
Glucose-Capillary: 210 mg/dL — ABNORMAL HIGH (ref 70–99)

## 2022-12-05 LAB — TROPONIN I (HIGH SENSITIVITY): Troponin I (High Sensitivity): 24 ng/L — ABNORMAL HIGH (ref <18)

## 2022-12-05 LAB — MAGNESIUM: Magnesium: 2.4 mg/dL (ref 1.7–2.4)

## 2022-12-05 LAB — HEMOGLOBIN A1C
Hgb A1c MFr Bld: 6.8 % — ABNORMAL HIGH (ref 4.8–5.6)
Mean Plasma Glucose: 148.46 mg/dL

## 2022-12-05 MED ORDER — MELATONIN 3 MG PO TABS
3.0000 mg | ORAL_TABLET | Freq: Every day | ORAL | Status: DC
  Administered 2022-12-05: 3 mg via ORAL
  Filled 2022-12-05: qty 1

## 2022-12-05 MED ORDER — DICLOFENAC SODIUM 1 % EX GEL
2.0000 g | Freq: Four times a day (QID) | CUTANEOUS | Status: DC
  Administered 2022-12-05 – 2022-12-06 (×6): 2 g via TOPICAL
  Filled 2022-12-05: qty 100

## 2022-12-05 MED ORDER — SENNOSIDES-DOCUSATE SODIUM 8.6-50 MG PO TABS
2.0000 | ORAL_TABLET | Freq: Once | ORAL | Status: AC
  Administered 2022-12-05: 2 via ORAL
  Filled 2022-12-05: qty 2

## 2022-12-05 MED ORDER — POTASSIUM CHLORIDE CRYS ER 20 MEQ PO TBCR
40.0000 meq | EXTENDED_RELEASE_TABLET | Freq: Once | ORAL | Status: AC
  Administered 2022-12-05: 40 meq via ORAL
  Filled 2022-12-05: qty 2

## 2022-12-05 MED ORDER — POLYETHYLENE GLYCOL 3350 17 G PO PACK
17.0000 g | PACK | Freq: Every day | ORAL | Status: DC
  Administered 2022-12-05: 17 g via ORAL
  Filled 2022-12-05 (×2): qty 1

## 2022-12-05 NOTE — Plan of Care (Signed)
  Problem: Education: Goal: Ability to describe self-care measures that may prevent or decrease complications (Diabetes Survival Skills Education) will improve Outcome: Progressing Goal: Individualized Educational Video(s) Outcome: Progressing   Problem: Fluid Volume: Goal: Ability to maintain a balanced intake and output will improve Outcome: Progressing   Problem: Nutritional: Goal: Maintenance of adequate nutrition will improve Outcome: Progressing Goal: Progress toward achieving an optimal weight will improve Outcome: Progressing   Problem: Skin Integrity: Goal: Risk for impaired skin integrity will decrease Outcome: Progressing   Problem: Activity: Goal: Capacity to carry out activities will improve Outcome: Progressing   Problem: Education: Goal: Ability to demonstrate management of disease process will improve Outcome: Progressing Goal: Ability to verbalize understanding of medication therapies will improve Outcome: Progressing Goal: Individualized Educational Video(s) Outcome: Progressing

## 2022-12-05 NOTE — Progress Notes (Signed)
PROGRESS NOTE    Alan Wang  ZOX:[id number] DOB: [birth year] DOA: 12/04/2022 PCP: Henrine Screws, MD    Brief Narrative:  Alan Wang is a 72 y.o. male with medical history significant of hypertension HLD DM2 COVID in June 2024, systolic CHF last EF 25-40% with global hypokinesis. Presented with worsening shortness of breath.  Was supposed to be taking entresto given by Dr. Jacinto Halim but only took for 1 week, never got labs done.   Assessment and Plan: Acute on chronic systolic CHF (congestive heart failure) (HCC) - Pt diagnosed with CHF based on presence of the following:  PND, OA  bilateral leg edema,    -strict I/Os NOT KEPT but patient says he urinated a lot  -repeat limited echo -daily weights  -IV lasix -per wife he had run out of entresto at home so suspect this is the reason for the volume overload -home O2 eval   CAD (coronary artery disease), native coronary artery -aspirin   Type II or unspecified type diabetes mellitus with other coma, not stated as uncontrolled  -  SSI   -resume home meds upon d/c     HTN (hypertension) Cont Coreg if BP allows, Entresto if BP allows   Elevated troponin In the setting of CHF cont to cycle CE -limited echo  Hypokalemia -replete  DVT prophylaxis: enoxaparin (LOVENOX) injection 40 mg Start: 12/04/22 2300    Code Status: Full Code Family Communication:   Disposition Plan:  Level of care: Telemetry Status is: Observation The patient will require care spanning > 2 midnights and should be moved to inpatient     Consultants:  none   Subjective: Has been urinating a lot  Objective: Vitals:   12/04/22 2158 12/05/22 0500 12/05/22 0529 12/05/22 0934  BP: (!) 188/105  (!) 138/96 121/74  Pulse: 74  78 87  Resp: 20  19 16   Temp: 98.2 F (36.8 C)  97.8 F (36.6 C) 98.3 F (36.8 C)  TempSrc: Oral  Oral Oral  SpO2: 98%  100% 97%  Weight:  88.9 kg    Height:        Intake/Output Summary (Last 24 hours) at 12/05/2022  1017 Last data filed at 12/05/2022 0900 Gross per 24 hour  Intake 240 ml  Output --  Net 240 ml   Filed Weights   12/04/22 2153 12/04/22 2157 12/05/22 0500  Weight: 88.9 kg 89.4 kg 88.9 kg    Examination:   General: Appearance:     Overweight male in no acute distress     Lungs:     Clear to auscultation bilaterally, respirations unlabored  Heart:    Normal heart rate. Normal rhythm. No murmurs, rubs, or gallops.    MS:   All extremities are intact.    Neurologic:   Awake, alert, oriented x 3. No apparent focal neurological           defect.        Data Reviewed: I have personally reviewed following labs and imaging studies  CBC: Recent Labs  Lab 12/04/22 1532 12/05/22 0003  WBC 7.5 7.5  HGB 16.7 15.8  HCT 52.9* 50.0  MCV 91.2 92.9  PLT 198 171   Basic Metabolic Panel: Recent Labs  Lab 12/04/22 1532 12/04/22 2208 12/05/22 0003  NA 139  --  143  K 4.0  --  3.3*  CL 104  --  106  CO2 27  --  24  GLUCOSE 125*  --  102*  BUN 20  --  17  CREATININE 0.81  --  0.68  CALCIUM 8.9  --  8.7*  MG  --  2.1 2.4  PHOS  --  3.1 3.2   GFR: Estimated Creatinine Clearance: 85 mL/min (by C-G formula based on SCr of 0.68 mg/dL). Liver Function Tests: Recent Labs  Lab 12/04/22 2208 12/05/22 0003  AST 15 17  ALT 15 15  ALKPHOS 71 61  BILITOT 0.7 1.2  PROT 6.5 6.5  ALBUMIN 3.5 3.6   No results for input(s): "LIPASE", "AMYLASE" in the last 168 hours. No results for input(s): "AMMONIA" in the last 168 hours. Coagulation Profile: No results for input(s): "INR", "PROTIME" in the last 168 hours. Cardiac Enzymes: No results for input(s): "CKTOTAL", "CKMB", "CKMBINDEX", "TROPONINI" in the last 168 hours. BNP (last 3 results) No results for input(s): "PROBNP" in the last 8760 hours. HbA1C: Recent Labs    12/04/22 2208  HGBA1C 6.8*   CBG: Recent Labs  Lab 12/04/22 2203 12/05/22 0357 12/05/22 0749  GLUCAP 101* 116* 149*   Lipid Profile: No results for  input(s): "CHOL", "HDL", "LDLCALC", "TRIG", "CHOLHDL", "LDLDIRECT" in the last 72 hours. Thyroid Function Tests: Recent Labs    12/04/22 2208  TSH 3.475   Anemia Panel: No results for input(s): "VITAMINB12", "FOLATE", "FERRITIN", "TIBC", "IRON", "RETICCTPCT" in the last 72 hours. Sepsis Labs: Recent Labs  Lab 12/04/22 2208  PROCALCITON <0.10    Recent Results (from the past 240 hour(s))  Resp panel by RT-PCR (RSV, Flu A&B, Covid) Anterior Nasal Swab     Status: None   Collection Time: 12/04/22  3:37 PM   Specimen: Anterior Nasal Swab  Result Value Ref Range Status   SARS Coronavirus 2 by RT PCR NEGATIVE NEGATIVE Final    Comment: (NOTE) SARS-CoV-2 target nucleic acids are NOT DETECTED.  The SARS-CoV-2 RNA is generally detectable in upper respiratory specimens during the acute phase of infection. The lowest concentration of SARS-CoV-2 viral copies this assay can detect is 138 copies/mL. A negative result does not preclude SARS-Cov-2 infection and should not be used as the sole basis for treatment or other patient management decisions. A negative result may occur with  improper specimen collection/handling, submission of specimen other than nasopharyngeal swab, presence of viral mutation(s) within the areas targeted by this assay, and inadequate number of viral copies(<138 copies/mL). A negative result must be combined with clinical observations, patient history, and epidemiological information. The expected result is Negative.  Fact Sheet for Patients:  BloggerCourse.com  Fact Sheet for Healthcare Providers:  SeriousBroker.it  This test is no t yet approved or cleared by the [place] FDA and  has been authorized for detection and/or diagnosis of SARS-CoV-2 by FDA under an Emergency Use Authorization (EUA). This EUA will remain  in effect (meaning this test can be used) for the duration of the COVID-19 declaration  under Section 564(b)(1) of the Act, 21 U.S.C.section 360bbb-3(b)(1), unless the authorization is terminated  or revoked sooner.       Influenza A by PCR NEGATIVE NEGATIVE Final   Influenza B by PCR NEGATIVE NEGATIVE Final    Comment: (NOTE) The Xpert Xpress SARS-CoV-2/FLU/RSV plus assay is intended as an aid in the diagnosis of influenza from Nasopharyngeal swab specimens and should not be used as a sole basis for treatment. Nasal washings and aspirates are unacceptable for Xpert Xpress SARS-CoV-2/FLU/RSV testing.  Fact Sheet for Patients: BloggerCourse.com  Fact Sheet for Healthcare Providers: SeriousBroker.it  This test is not yet approved or cleared by the [place]  [place] FDA and has been authorized for detection and/or diagnosis of SARS-CoV-2 by FDA under an Emergency Use Authorization (EUA). This EUA will remain in effect (meaning this test can be used) for the duration of the COVID-19 declaration under Section 564(b)(1) of the Act, 21 U.S.C. section 360bbb-3(b)(1), unless the authorization is terminated or revoked.     Resp Syncytial Virus by PCR NEGATIVE NEGATIVE Final    Comment: (NOTE) Fact Sheet for Patients: BloggerCourse.com  Fact Sheet for Healthcare Providers: SeriousBroker.it  This test is not yet approved or cleared by the [place] FDA and has been authorized for detection and/or diagnosis of SARS-CoV-2 by FDA under an Emergency Use Authorization (EUA). This EUA will remain in effect (meaning this test can be used) for the duration of the COVID-19 declaration under Section 564(b)(1) of the Act, 21 U.S.C. section 360bbb-3(b)(1), unless the authorization is terminated or revoked.  Performed at [facility], [address], [place], [place] [address]          Radiology Studies: DG Chest 2 View  Result Date: 12/04/2022 CLINICAL  DATA:  Chest pain and shortness of breath. EXAM: CHEST - 2 VIEW COMPARISON:  Chest radiograph dated 08/21/2022. FINDINGS: The heart size and mediastinal contours are within normal limits. Both lungs are clear. Degenerative changes are seen in the spine. IMPRESSION: No active cardiopulmonary disease. Electronically Signed   By: Romona Curls M.D.   On: 12/04/2022 15:34        Scheduled Meds:  aspirin  81 mg Oral Daily   carvedilol  6.25 mg Oral BID WC   diclofenac Sodium  2 g Topical QID   DULoxetine  30 mg Oral QHS   enoxaparin (LOVENOX) injection  40 mg Subcutaneous Q24H   furosemide  40 mg Intravenous Daily   insulin aspart  0-9 Units Subcutaneous Q4H   polyethylene glycol  17 g Oral Daily   sacubitril-valsartan  1 tablet Oral BID   senna-docusate  2 tablet Oral Once   sodium chloride flush  3 mL Intravenous Q12H   Continuous Infusions:  sodium chloride       LOS: 0 days    Time spent: 45 minutes spent on chart review, discussion with nursing staff, consultants, updating family and interview/physical exam; more than 50% of that time was spent in counseling and/or coordination of care.    Joseph Art, DO [facility] Available via Epic secure chat 7am-7pm After these hours, please refer to coverage provider listed on [email or web address] 12/05/2022, 10:17 AM

## 2022-12-05 NOTE — TOC Progression Note (Signed)
Transition of Care [facility]) - Progression Note    Patient Details  Name: Alan Wang MRN: [id number] Date of Birth: 09-30-1950  [facility]) CM/SW Contact  Adrian Prows, RN Phone Number: 12/05/2022, 2:07 PM  Clinical Narrative:    Orders received for [facility] screen; HF Navigation consult orders previously placed.       Expected Discharge Plan and Services                                               Social Determinants of Health (SDOH) Interventions SDOH Screenings   Food Insecurity: No Food Insecurity (12/04/2022)  Housing: Low Risk  (12/04/2022)  Transportation Needs: No Transportation Needs (12/04/2022)  Utilities: Not At Risk (12/04/2022)  Tobacco Use: Low Risk  (12/04/2022)    Readmission Risk Interventions    08/23/2022    1:16 PM  Readmission Risk Prevention Plan  Post Dischage Appt Complete  Medication Screening Complete  Transportation Screening Complete

## 2022-12-05 NOTE — TOC Progression Note (Signed)
Transition of Care [facility]) - Progression Note    Patient Details  Name: Alan Wang MRN: [id number] Date of Birth: October 21, 1950  Transition of Care [facility]) CM/SW Contact  Adrian Prows, RN Phone Number: 12/05/2022, 9:06 AM  Clinical Narrative:    [facility] consult for HF screen; consult for Heart Failure Navigation team previously placed.        Expected Discharge Plan and Services                                               Social Determinants of Health (SDOH) Interventions SDOH Screenings   Food Insecurity: No Food Insecurity (12/04/2022)  Housing: Low Risk  (12/04/2022)  Transportation Needs: No Transportation Needs (12/04/2022)  Utilities: Not At Risk (12/04/2022)  Tobacco Use: Low Risk  (12/04/2022)    Readmission Risk Interventions    08/23/2022    1:16 PM  Readmission Risk Prevention Plan  Post Dischage Appt Complete  Medication Screening Complete  Transportation Screening Complete

## 2022-12-05 NOTE — Evaluation (Signed)
Physical Therapy Evaluation and Discharge from Acute PT Patient Details Name: Alan Wang MRN: [id number] DOB: 03/29/1950 Today's Date: 12/05/2022  History of Present Illness  72 y.o. male presented with worsening shortness of breath and admitted 12/04/22 for acute on chronic systolic congestive heart failure.  PMHx significant for but not limited to: hypertension, HLD, DM2, COVID in June 2024, systolic CHF last EF 25-40% with global hypokinesis, TIA, CAD  Clinical Impression  Patient evaluated by Physical Therapy with no further acute PT needs identified. All education has been completed and the patient has no further questions.  Pt mobilizing well and hopeful to d/c home today (his [birth year] was yesterday).  Pt ambulated good distance in hallway and SPO2 remained 93% or above on room air during session.  No f/u PT or DME needs identified at this time. PT is signing off. Thank you for this referral.       If plan is discharge home, recommend the following:     Can travel by private vehicle        Equipment Recommendations None recommended by PT  Recommendations for Other Services       Functional Status Assessment Patient has not had a recent decline in their functional status     Precautions / Restrictions Precautions Precautions: None      Mobility  Bed Mobility Overal bed mobility: Modified Independent                  Transfers Overall transfer level: Modified independent                      Ambulation/Gait Ambulation/Gait assistance: Supervision, Modified independent (Device/Increase time) Gait Distance (Feet): 400 Feet Assistive device: None Gait Pattern/deviations: WFL(Within Functional Limits)       General Gait Details: no LOB or unsteadiness observed, SPO2 monitored and 93-98% on room air  Stairs            Wheelchair Mobility     Tilt Bed    Modified Rankin (Stroke Patients Only)       Balance Overall balance assessment:  History of Falls (reports falls but lately has been on stairs; getting stair chair lift installed this week)                                           Pertinent Vitals/Pain Pain Assessment Pain Assessment: No/denies pain    Home Living Family/patient expects to be discharged to:: Private residence Living Arrangements: Spouse/significant other   Type of Home: House Home Access: Stairs to enter Entrance Stairs-Rails: Lawyer of Steps: 3 Alternate Level Stairs-Number of Steps: second floor is his "man cave" but he is having stair chair lift installed this week Home Layout: Multi-level;Able to live on main level with bedroom/bathroom Home Equipment: Gilmer Mor - single point      Prior Function Prior Level of Function : Independent/Modified Independent             Mobility Comments: Independent with ambulation       Extremity/Trunk Assessment        Lower Extremity Assessment Lower Extremity Assessment: Overall WFL for tasks assessed    Cervical / Trunk Assessment Cervical / Trunk Assessment: Normal  Communication   Communication Communication: No apparent difficulties  Cognition Arousal: Alert Behavior During Therapy: WFL for tasks assessed/performed Overall Cognitive Status: Within Functional Limits for  tasks assessed                                          General Comments      Exercises     Assessment/Plan    PT Assessment Patient does not need any further PT services  PT Problem List         PT Treatment Interventions      PT Goals (Current goals can be found in the Care Plan section)  Acute Rehab PT Goals PT Goal Formulation: All assessment and education complete, DC therapy    Frequency       Co-evaluation               AM-PAC PT "6 Clicks" Mobility  Outcome Measure Help needed turning from your back to your side while in a flat bed without using bedrails?: None Help needed  moving from lying on your back to sitting on the side of a flat bed without using bedrails?: None Help needed moving to and from a bed to a chair (including a wheelchair)?: None Help needed standing up from a chair using your arms (e.g., wheelchair or bedside chair)?: None Help needed to walk in hospital room?: None Help needed climbing 3-5 steps with a railing? : A Little 6 Click Score: 23    End of Session Equipment Utilized During Treatment: Gait belt Activity Tolerance: Patient tolerated treatment well Patient left: in chair;with call bell/phone within reach Nurse Communication: Mobility status PT Visit Diagnosis: Difficulty in walking, not elsewhere classified (R26.2)    Time: 0943-1000 PT Time Calculation (min) (ACUTE ONLY): 17 min   Charges:   PT Evaluation $PT Eval Low Complexity: 1 Low   PT General Charges $$ ACUTE PT VISIT: 1 Visit       Paulino Door, DPT Physical Therapist Acute Rehabilitation Services Office: [phone]   Janan Halter P[place] 12/05/2022, 11:56 AM

## 2022-12-05 NOTE — Evaluation (Signed)
Occupational Therapy Evaluation Patient Details Name: Alan Wang MRN: [id number] DOB: [birth year] Today's Date: 12/05/2022   History of Present Illness Patient is a 72 y.o. male presented with worsening shortness of breath and admitted 12/04/22 for acute on chronic systolic congestive heart failure.  PMHx significant for but not limited to: hypertension, HLD, DM2, COVID in June 2024, systolic CHF last EF 25-40% with global hypokinesis, TIA, CAD   Clinical Impression   Patient evaluated by Occupational Therapy with no further acute OT needs identified. All education has been completed and the patient has no further questions. Patient is MI for ADLs in room with patient reporting wanting to make changes in lifestyle but not beign receptive to education from therapist at this time.  See below for any follow-up Occupational Therapy or equipment needs. OT is signing off. Thank you for this referral.       If plan is discharge home, recommend the following:      Functional Status Assessment  Patient has not had a recent decline in their functional status  Equipment Recommendations  None recommended by OT       Precautions / Restrictions Precautions Precautions: None Restrictions Weight Bearing Restrictions: No      Balance Overall balance assessment: No apparent balance deficits (not formally assessed)           ADL either performed or assessed with clinical judgement   ADL Overall ADL's : Modified independent         General ADL Comments: patient was able to participate in mobility in room to and from bathroom with no AD with no LOB or reaching out for furniture. patient was able to complete figure four positioning seated in recliner for dimulated LB dressing tasks. patient stood at sink and completed grooming tasks with no physical A or loss of balance.  patient noted to report "sedinatry life style" since retiring from employment 11 years ago. patient reproted dissatisfaction  with his poor actiivty tolerance and changes in balance. patient not interested in making lifestyl changes as he reported " i am not a big exercise person." and does not want exercises to address any of the various issues that patient reported during session like being able to open jars or get out of chairs without using BUE to push up. patient reported having had shoulder replacement completed earlier this year on R shoulder with patient reporting still having pain in this area. patient's thoughts and feeling were heard and validated. patient was educate don how lifesyle changes and increasing functional activity would be needed to be able to do some of the things he desired to change about current situation. patient verbalized understanding but noted to have various reasons as to why he did not want to make changes. since patient appears to be functional in ADL tasks and declines to further work on BUE ROM/strengthening that he reports deficits with OT to sign off at this time.     Vision   Vision Assessment?: No apparent visual deficits            Pertinent Vitals/Pain Pain Assessment Pain Assessment: No/denies pain     Extremity/Trunk Assessment Upper Extremity Assessment Upper Extremity Assessment: Overall WFL for tasks assessed   Lower Extremity Assessment Lower Extremity Assessment: Overall WFL for tasks assessed   Cervical / Trunk Assessment Cervical / Trunk Assessment: Normal   Communication Communication Communication: No apparent difficulties   Cognition Arousal: Alert Behavior During Therapy: WFL for tasks assessed/performed Overall Cognitive Status: Within  Functional Limits for tasks assessed           General Comments: patient noted to have some repetative tangental speech during session reporting about his testing when he was 72 years old and dsicovering issues with high blood pressure and other testing. patient also reported that he did not know that he had any  heart problems until this hospitalization but endorsed having a cardiologist who he sees regularly and that he has had multiple stents put in. patient appears to have poor insight to deficits.                Home Living Family/patient expects to be discharged to:: Private residence Living Arrangements: Spouse/significant other Available Help at Discharge: Family Type of Home: House Home Access: Stairs to enter Secretary/administrator of Steps: 3 Entrance Stairs-Rails: Left;Right Home Layout: Multi-level;Able to live on main level with bedroom/bathroom Alternate Level Stairs-Number of Steps: second floor is his "man cave" but he is having stair chair lift installed this week   Bathroom Shower/Tub: Walk-in shower         Home Equipment: Gilmer Mor - single point          Prior Functioning/Environment Prior Level of Function : Independent/Modified Independent             Mobility Comments: Independent with ambulation ADLs Comments: He was independent with ADLs and ambulation.                 OT Goals(Current goals can be found in the care plan section) Acute Rehab OT Goals OT Goal Formulation: All assessment and education complete, DC therapy  OT Frequency:         AM-PAC OT "6 Clicks" Daily Activity     Outcome Measure Help from another person eating meals?: None Help from another person taking care of personal grooming?: None Help from another person toileting, which includes using toliet, bedpan, or urinal?: None Help from another person bathing (including washing, rinsing, drying)?: None Help from another person to put on and taking off regular upper body clothing?: None Help from another person to put on and taking off regular lower body clothing?: None 6 Click Score: 24   End of Session    Activity Tolerance: Patient tolerated treatment well Patient left: in chair;with call bell/phone within reach  OT Visit Diagnosis: Unsteadiness on feet (R26.81)                 Time: 9562-1308 OT Time Calculation (min): 24 min Charges:  OT General Charges $OT Visit: 1 Visit OT Evaluation $OT Eval Low Complexity: 1 Low OT Treatments $Self Care/Home Management : 8-22 mins  Rosalio Loud, MS Acute Rehabilitation Department Office# [phone]   Selinda Flavin 12/05/2022, 12:32 PM

## 2022-12-06 DIAGNOSIS — E119 Type 2 diabetes mellitus without complications: Secondary | ICD-10-CM | POA: Diagnosis not present

## 2022-12-06 DIAGNOSIS — I5022 Chronic systolic (congestive) heart failure: Secondary | ICD-10-CM | POA: Diagnosis not present

## 2022-12-06 DIAGNOSIS — I5023 Acute on chronic systolic (congestive) heart failure: Secondary | ICD-10-CM | POA: Diagnosis not present

## 2022-12-06 DIAGNOSIS — Z794 Long term (current) use of insulin: Secondary | ICD-10-CM | POA: Diagnosis not present

## 2022-12-06 DIAGNOSIS — I2510 Atherosclerotic heart disease of native coronary artery without angina pectoris: Secondary | ICD-10-CM | POA: Diagnosis not present

## 2022-12-06 DIAGNOSIS — E440 Moderate protein-calorie malnutrition: Secondary | ICD-10-CM | POA: Insufficient documentation

## 2022-12-06 LAB — CBC
HCT: 55.1 % — ABNORMAL HIGH (ref 39.0–52.0)
Hemoglobin: 18 g/dL — ABNORMAL HIGH (ref 13.0–17.0)
MCH: 29.4 pg (ref 26.0–34.0)
MCHC: 32.7 g/dL (ref 30.0–36.0)
MCV: 89.9 fL (ref 80.0–100.0)
Platelets: 180 10*3/uL (ref 150–400)
RBC: 6.13 MIL/uL — ABNORMAL HIGH (ref 4.22–5.81)
RDW: 15.4 % (ref 11.5–15.5)
WBC: 7.6 10*3/uL (ref 4.0–10.5)
nRBC: 0 % (ref 0.0–0.2)

## 2022-12-06 LAB — BASIC METABOLIC PANEL
Anion gap: 12 (ref 5–15)
BUN: 27 mg/dL — ABNORMAL HIGH (ref 8–23)
CO2: 22 mmol/L (ref 22–32)
Calcium: 9.1 mg/dL (ref 8.9–10.3)
Chloride: 103 mmol/L (ref 98–111)
Creatinine, Ser: 0.59 mg/dL — ABNORMAL LOW (ref 0.61–1.24)
GFR, Estimated: >60 mL/min (ref >60)
Glucose, Bld: 141 mg/dL — ABNORMAL HIGH (ref 70–99)
Potassium: 3.8 mmol/L (ref 3.5–5.1)
Sodium: 137 mmol/L (ref 135–145)

## 2022-12-06 LAB — GLUCOSE, CAPILLARY
Glucose-Capillary: 177 mg/dL — ABNORMAL HIGH (ref 70–99)
Glucose-Capillary: 169 mg/dL — ABNORMAL HIGH (ref 70–99)
Glucose-Capillary: 250 mg/dL — ABNORMAL HIGH (ref 70–99)

## 2022-12-06 MED ORDER — FUROSEMIDE 40 MG PO TABS: 40.0000 mg | ORAL_TABLET | Freq: Every day | ORAL | 2 refills | Status: DC

## 2022-12-06 MED ORDER — CARVEDILOL 6.25 MG PO TABS: 6.2500 mg | ORAL_TABLET | Freq: Two times a day (BID) | ORAL | 2 refills | Status: DC

## 2022-12-06 MED ORDER — GLUCERNA SHAKE PO LIQD
237.0000 mL | Freq: Two times a day (BID) | ORAL | Status: DC
  Administered 2022-12-06: 237 mL via ORAL
  Filled 2022-12-06: qty 237

## 2022-12-06 MED ORDER — ADULT MULTIVITAMIN W/MINERALS CH
1.0000 | ORAL_TABLET | Freq: Every day | ORAL | Status: DC
  Administered 2022-12-06: 1 via ORAL
  Filled 2022-12-06: qty 1

## 2022-12-06 MED ORDER — DICLOFENAC SODIUM 1 % EX GEL: 2.0000 g | Freq: Four times a day (QID) | CUTANEOUS | 2 refills | Status: DC

## 2022-12-06 MED ORDER — NITROGLYCERIN 0.4 MG SL SUBL: 0.4000 mg | SUBLINGUAL_TABLET | SUBLINGUAL | 0 refills | Status: AC | PRN

## 2022-12-06 MED ORDER — ENTRESTO 24-26 MG PO TABS: 1.0000 | ORAL_TABLET | Freq: Two times a day (BID) | ORAL | 2 refills | Status: DC

## 2022-12-06 NOTE — TOC Transition Note (Signed)
Transition of Care [facility]) - CM/SW Discharge Note   Patient Details  Name: Alan Wang MRN: [id number] Date of Birth: May 13, 1950  [facility] CM/SW Contact:  Darleene Cleaver, LCSW Phone Number: 12/06/2022, 1:38 PM   Clinical Narrative:       Final next level of care: Home/Self Care Barriers to Discharge: Barriers Resolved   Patient Goals and CMS Choice CMS Medicare.gov Compare Post Acute Care list provided to:: Patient Choice offered to / list presented to : Patient  Discharge Placement                Patient will be discharging back home.  Patient does not need any HH services, patient does not have any SDOH needs.  TOC signing off, please reconsult if other TOC needs arise.         Discharge Plan and Services Additional resources added to the After Visit Summary for                                       Social Determinants of Health (SDOH) Interventions SDOH Screenings   Food Insecurity: No Food Insecurity (12/04/2022)  Housing: Low Risk  (12/04/2022)  Transportation Needs: No Transportation Needs (12/04/2022)  Utilities: Not At Risk (12/04/2022)  Tobacco Use: Low Risk  (12/04/2022)     Readmission Risk Interventions    08/23/2022    1:16 PM  Readmission Risk Prevention Plan  Post Dischage Appt Complete  Medication Screening Complete  Transportation Screening Complete

## 2022-12-06 NOTE — Plan of Care (Signed)
  Problem: Education: Goal: Ability to describe self-care measures that may prevent or decrease complications (Diabetes Survival Skills Education) will improve Outcome: Progressing Goal: Individualized Educational Video(s) Outcome: Progressing   Problem: Fluid Volume: Goal: Ability to maintain a balanced intake and output will improve Outcome: Progressing   

## 2022-12-06 NOTE — Progress Notes (Signed)
Initial Nutrition Assessment  DOCUMENTATION CODES:   Non-severe (moderate) malnutrition in context of chronic illness  INTERVENTION:   Glucerna Shake po BID, each supplement provides 220 kcal and 10 grams of protein MVI with minerals daily  NUTRITION DIAGNOSIS:   Moderate Malnutrition related to chronic illness (asthma, CAD, CHF) as evidenced by moderate muscle depletion, moderate fat depletion.  GOAL:   Patient will meet greater than or equal to 90% of their needs  MONITOR:   PO intake, Supplement acceptance  REASON FOR ASSESSMENT:   Consult Assessment of nutrition requirement/status  ASSESSMENT:   72 yo male admitted with acute on chronic systolic CHF. PMH includes asthma, HTN, CAD, stroke, CHF, DM-2 on insulin, HLD, skin cancer.  Patient reports good intake of meals. He didn't order lunch today because he usually just eats 2 meals per day at home, breakfast and dinner. He says his weight has fluctuated over the years, but he has settled at around 180-190 lbs recently. He had COVID a few months ago and was not eating well during his recovery. Discussed the importance of limiting sodium intake at home to help prevent fluid retention. Also discussed adding a snack at lunch time and/or bedtime every day. He could incorporate Glucerna shakes once or twice daily to maximize protein and calorie intake.   Currently on a carb modified diet; meal intakes 100% x 3 meals 9/29.  Labs reviewed.  CBG: [phone]  Medications reviewed and include Lasix, Novolog SSI q 4 hours.  Patient meets criteria for moderate malnutrition, given mild-moderate depletion of muscle and subcutaneous fat mass. Suspect current edema is masking actual dry weight loss.   NUTRITION - FOCUSED PHYSICAL EXAM:  Flowsheet Row Most Recent Value  Orbital Region Moderate depletion  Upper Arm Region Mild depletion  Thoracic and Lumbar Region Mild depletion  Buccal Region Moderate depletion  Temple Region  Moderate depletion  Clavicle Bone Region Severe depletion  Clavicle and Acromion Bone Region Moderate depletion  Scapular Bone Region Moderate depletion  Dorsal Hand Moderate depletion  Patellar Region Moderate depletion  Anterior Thigh Region Moderate depletion  Posterior Calf Region Moderate depletion  Edema (RD Assessment) Mild  Hair Reviewed  Eyes Reviewed  Mouth Reviewed  Skin Reviewed  Nails Reviewed       Diet Order:   Diet Order             Diet Carb Modified Fluid consistency: Thin; Room service appropriate? Yes  Diet effective now                   EDUCATION NEEDS:   Education needs have been addressed  Skin:  Skin Assessment: Reviewed RN Assessment  Last BM:  9/23  Height:   Ht Readings from Last 1 Encounters:  12/04/22 5' 10.5" (1.791 m)    Weight:   Wt Readings from Last 1 Encounters:  12/06/22 87 kg    Ideal Body Weight:  76.8 kg  BMI:  Body mass index is 27.13 kg/m.  Estimated Nutritional Needs:   Kcal:  1900-2100  Protein:  85-100 gm  Fluid:  1.5-2 L   Gabriel Rainwater RD, LDN, CNSC Please refer to Amion for contact information.

## 2022-12-06 NOTE — Progress Notes (Signed)
Patient discharged home with wife. Discharge instructions given and explained to patient/wife, they verbalized understanding. Patient denies any pain or distress. No pressure injury noted. Accompanied home by wife.

## 2022-12-06 NOTE — Plan of Care (Signed)
  Problem: Education: Goal: Ability to describe self-care measures that may prevent or decrease complications (Diabetes Survival Skills Education) will improve 12/06/2022 1334 by Shirleen Schirmer, RN Outcome: Adequate for Discharge 12/06/2022 1154 by Shirleen Schirmer, RN Outcome: Progressing Goal: Individualized Educational Video(s) 12/06/2022 1334 by Shirleen Schirmer, RN Outcome: Adequate for Discharge 12/06/2022 1154 by Shirleen Schirmer, RN Outcome: Progressing

## 2022-12-06 NOTE — Plan of Care (Signed)
  Problem: Education: Goal: Ability to describe self-care measures that may prevent or decrease complications (Diabetes Survival Skills Education) will improve Outcome: Progressing Goal: Individualized Educational Video(s) Outcome: Progressing   Problem: Fluid Volume: Goal: Ability to maintain a balanced intake and output will improve Outcome: Progressing   Problem: Nutritional: Goal: Maintenance of adequate nutrition will improve Outcome: Progressing Goal: Progress toward achieving an optimal weight will improve Outcome: Progressing   Problem: Skin Integrity: Goal: Risk for impaired skin integrity will decrease Outcome: Progressing   Problem: Education: Goal: Ability to demonstrate management of disease process will improve Outcome: Progressing Goal: Ability to verbalize understanding of medication therapies will improve Outcome: Progressing Goal: Individualized Educational Video(s) Outcome: Progressing

## 2022-12-06 NOTE — Discharge Summary (Addendum)
to baseline, no shortness of breath, orthopnea or PND, feeling better.  Ready go home today.  BP 96/69 (BP Location: Left Arm)   Pulse 85   Temp 98.2 F (36.8 C) (Oral)   Resp 18   Ht 5' 10.5" (1.791 m)   Wt 87 kg   SpO2 95%   BMI 27.13 kg/m    Physical Exam General: Alert and oriented x 3, NAD Cardiovascular: S1 S2 clear, RRR.  Respiratory: CTAB, no wheezing, rales Gastrointestinal: Soft, nontender, nondistended, NBS Ext: no pedal edema bilaterally Neuro: no new deficits Psych: Normal affect     Condition at discharge: fair  The results of significant diagnostics from this hospitalization (including imaging, microbiology, ancillary and laboratory) are listed below for reference.   Imaging Studies: ECHOCARDIOGRAM LIMITED  Result Date: 12/05/2022    ECHOCARDIOGRAM LIMITED REPORT   Patient Name:   Alan Wang Date of Exam: 12/05/2022 Medical Rec #:  [id number]     Height:       70.5 in Accession #:    [id number]    Weight:       196.0 lb Date of Birth:  1950/08/05     BSA:          2.080 m Patient Age:    72 years      BP:           138/86 mmHg Patient Gender: M              HR:           87 bpm. Exam Location:  Inpatient Procedure: Limited Echo, Color Doppler and Cardiac Doppler Indications:    CHF  History:        Patient has prior history of Echocardiogram examinations, most                 recent 08/23/2022. CHF, CAD, TIA; Risk Factors:Hypertension,                 Diabetes and Dyslipidemia.  Sonographer:    Milbert Coulter Referring Phys: [id number] JESSICA U VANN IMPRESSIONS  1. Left ventricular ejection fraction, by estimation, is 30 to 35%. The left ventricle has moderately decreased function. Left ventricular endocardial border not optimally defined to evaluate regional wall motion. There is mild left ventricular hypertrophy. Left ventricular diastolic parameters are consistent with Grade I diastolic dysfunction (impaired relaxation).  2. Right ventricle is poorly visualized but appears grossly normal size and systolic function  3. Technically difficult study. Consider repeating limited study with contrast for better evaluation of LV systolic function FINDINGS  Left Ventricle: Left ventricular ejection fraction, by estimation, is 30 to 35%. The left ventricle has moderately decreased function. Left ventricular endocardial border not optimally defined to evaluate regional wall motion. The left ventricular internal cavity size was normal in size. There is mild left ventricular hypertrophy. Left ventricular diastolic parameters are consistent with Grade I diastolic dysfunction (impaired relaxation). Right Ventricle: The right ventricular size is normal. No increase in right ventricular wall thickness. Right ventricular systolic function is normal. Pericardium: There is no evidence of pericardial effusion. Mitral Valve: The mitral valve is grossly normal. Trivial mitral valve regurgitation. No evidence of mitral valve stenosis. Aortic Valve: The aortic valve was not well visualized. Additional Comments: Spectral Doppler performed. Color Doppler performed.  LEFT VENTRICLE PLAX 2D LVIDd:          4.50 cm Diastology LVIDs:         4.30 cm LV e' medial:  to baseline, no shortness of breath, orthopnea or PND, feeling better.  Ready go home today.  BP 96/69 (BP Location: Left Arm)   Pulse 85   Temp 98.2 F (36.8 C) (Oral)   Resp 18   Ht 5' 10.5" (1.791 m)   Wt 87 kg   SpO2 95%   BMI 27.13 kg/m    Physical Exam General: Alert and oriented x 3, NAD Cardiovascular: S1 S2 clear, RRR.  Respiratory: CTAB, no wheezing, rales Gastrointestinal: Soft, nontender, nondistended, NBS Ext: no pedal edema bilaterally Neuro: no new deficits Psych: Normal affect     Condition at discharge: fair  The results of significant diagnostics from this hospitalization (including imaging, microbiology, ancillary and laboratory) are listed below for reference.   Imaging Studies: ECHOCARDIOGRAM LIMITED  Result Date: 12/05/2022    ECHOCARDIOGRAM LIMITED REPORT   Patient Name:   Alan Wang Date of Exam: 12/05/2022 Medical Rec #:  [id number]     Height:       70.5 in Accession #:    [id number]    Weight:       196.0 lb Date of Birth:  1950/08/05     BSA:          2.080 m Patient Age:    72 years      BP:           138/86 mmHg Patient Gender: M              HR:           87 bpm. Exam Location:  Inpatient Procedure: Limited Echo, Color Doppler and Cardiac Doppler Indications:    CHF  History:        Patient has prior history of Echocardiogram examinations, most                 recent 08/23/2022. CHF, CAD, TIA; Risk Factors:Hypertension,                 Diabetes and Dyslipidemia.  Sonographer:    Milbert Coulter Referring Phys: [id number] JESSICA U VANN IMPRESSIONS  1. Left ventricular ejection fraction, by estimation, is 30 to 35%. The left ventricle has moderately decreased function. Left ventricular endocardial border not optimally defined to evaluate regional wall motion. There is mild left ventricular hypertrophy. Left ventricular diastolic parameters are consistent with Grade I diastolic dysfunction (impaired relaxation).  2. Right ventricle is poorly visualized but appears grossly normal size and systolic function  3. Technically difficult study. Consider repeating limited study with contrast for better evaluation of LV systolic function FINDINGS  Left Ventricle: Left ventricular ejection fraction, by estimation, is 30 to 35%. The left ventricle has moderately decreased function. Left ventricular endocardial border not optimally defined to evaluate regional wall motion. The left ventricular internal cavity size was normal in size. There is mild left ventricular hypertrophy. Left ventricular diastolic parameters are consistent with Grade I diastolic dysfunction (impaired relaxation). Right Ventricle: The right ventricular size is normal. No increase in right ventricular wall thickness. Right ventricular systolic function is normal. Pericardium: There is no evidence of pericardial effusion. Mitral Valve: The mitral valve is grossly normal. Trivial mitral valve regurgitation. No evidence of mitral valve stenosis. Aortic Valve: The aortic valve was not well visualized. Additional Comments: Spectral Doppler performed. Color Doppler performed.  LEFT VENTRICLE PLAX 2D LVIDd:          4.50 cm Diastology LVIDs:         4.30 cm LV e' medial:  Physician Discharge Summary   Patient: Alan Wang MRN: [id number] DOB: [birth year]  Admit date:     12/04/2022  Discharge date: 12/06/22  Discharge Physician: Thad Ranger, MD    PCP: Henrine Screws, MD   Recommendations at discharge:   Continue Lasix 40 mg p.o. daily, need follow-up appointment with his cardiologist, Dr. Jacinto Halim and check BMET. (Message sent to Dr. Jacinto Halim and communicated to patient's wife as well)  Discharge Diagnoses:    Acute on chronic systolic CHF (congestive heart failure) (HCC)   CAD (coronary artery disease), native coronary artery   HTN (hypertension)   Elevated troponin   Malnutrition of moderate degree Diabetes mellitus type 2, insulin-dependent   Hospital Course:  Alan Wang is a 72 y.o. male with medical history significant of hypertension HLD DM2 COVID in June 2024, systolic CHF last EF 25-40% with global hypokinesis. Presented with worsening shortness of breath.  Was supposed to be taking entresto given by Dr. Jacinto Halim but only took for 1 week, never got labs done.    Assessment and Plan:  Acute on chronic systolic CHF (congestive heart failure) (HCC) - Pt diagnosed with CHF based on presence of the following, orthopnea, PND, bilateral leg edema,    -per wife he had run out of entresto at home so suspect this is the reason for the volume overload -Patient was placed on IV Lasix 40 mill grams daily for diuresis.  Weight 200 pounds on admission, at discharge 191lbs -Creatinine 0.59 at discharge 2D echo showed EF of 30 to 35%, mild LVH, grade 1 diastolic dysfunction (2D echo 06/979 had shown EF of 25 to 30%) -Counseled patient on compliance with medications, discharged on Lasix 40 mg p.o. daily, Coreg 6.25 mg twice daily, Entresto 24-26 mg twice daily, Jardiance -Patient will follow-up with Dr. Jacinto Halim in the next 7 to 10 days, needs follow-up lab limit further renal function    CAD (coronary artery disease), native coronary artery -Continue  aspirin, beta-blocker, Repatha n   Type II or unspecified type diabetes mellitus with other coma, not stated as uncontrolled  -Patient was placed on sliding scale insulin while inpatient.  I called the patient's wife and confirmed his insulin regimen.  He is also on Mounjaro, Jardiance, Novolin insulin regimen, follows Dr. Talmage Nap -Continue home regimen     HTN (hypertension) Cont Coreg, Entresto, p.o. Lasix as BP allows.  Outpatient follow-up with cardiology.   Elevated troponin In the setting of CHF, no acute chest pain. -2D echo showed EF 30-35%, mildly improved from previous echo in June 2024   Hypokalemia Replace     Pain control - [facility] Controlled Substance Reporting System database was reviewed. and patient was instructed, not to drive, operate heavy machinery, perform activities at heights, swimming or participation in water activities or provide baby-sitting services while on Pain, Sleep and Anxiety Medications; until their outpatient Physician has advised to do so again. Also recommended to not to take more than prescribed Pain, Sleep and Anxiety Medications.  Consultants: None Procedures performed: 2D echo Disposition: Home Diet recommendation:  Discharge Diet Orders (From admission, onward)     Start     Ordered   12/06/22 0000  Diet Carb Modified        12/06/22 1324            DISCHARGE MEDICATION: Allergies as of 12/06/2022       Reactions   Avandia [rosiglitazone Maleate] Swelling, Other (See Comments)   Lower extremities became swollen  Physician Discharge Summary   Patient: Alan Wang MRN: [id number] DOB: [birth year]  Admit date:     12/04/2022  Discharge date: 12/06/22  Discharge Physician: Thad Ranger, MD    PCP: Henrine Screws, MD   Recommendations at discharge:   Continue Lasix 40 mg p.o. daily, need follow-up appointment with his cardiologist, Dr. Jacinto Halim and check BMET. (Message sent to Dr. Jacinto Halim and communicated to patient's wife as well)  Discharge Diagnoses:    Acute on chronic systolic CHF (congestive heart failure) (HCC)   CAD (coronary artery disease), native coronary artery   HTN (hypertension)   Elevated troponin   Malnutrition of moderate degree Diabetes mellitus type 2, insulin-dependent   Hospital Course:  Alan Wang is a 72 y.o. male with medical history significant of hypertension HLD DM2 COVID in June 2024, systolic CHF last EF 25-40% with global hypokinesis. Presented with worsening shortness of breath.  Was supposed to be taking entresto given by Dr. Jacinto Halim but only took for 1 week, never got labs done.    Assessment and Plan:  Acute on chronic systolic CHF (congestive heart failure) (HCC) - Pt diagnosed with CHF based on presence of the following, orthopnea, PND, bilateral leg edema,    -per wife he had run out of entresto at home so suspect this is the reason for the volume overload -Patient was placed on IV Lasix 40 mill grams daily for diuresis.  Weight 200 pounds on admission, at discharge 191lbs -Creatinine 0.59 at discharge 2D echo showed EF of 30 to 35%, mild LVH, grade 1 diastolic dysfunction (2D echo 06/979 had shown EF of 25 to 30%) -Counseled patient on compliance with medications, discharged on Lasix 40 mg p.o. daily, Coreg 6.25 mg twice daily, Entresto 24-26 mg twice daily, Jardiance -Patient will follow-up with Dr. Jacinto Halim in the next 7 to 10 days, needs follow-up lab limit further renal function    CAD (coronary artery disease), native coronary artery -Continue  aspirin, beta-blocker, Repatha n   Type II or unspecified type diabetes mellitus with other coma, not stated as uncontrolled  -Patient was placed on sliding scale insulin while inpatient.  I called the patient's wife and confirmed his insulin regimen.  He is also on Mounjaro, Jardiance, Novolin insulin regimen, follows Dr. Talmage Nap -Continue home regimen     HTN (hypertension) Cont Coreg, Entresto, p.o. Lasix as BP allows.  Outpatient follow-up with cardiology.   Elevated troponin In the setting of CHF, no acute chest pain. -2D echo showed EF 30-35%, mildly improved from previous echo in June 2024   Hypokalemia Replace     Pain control - [facility] Controlled Substance Reporting System database was reviewed. and patient was instructed, not to drive, operate heavy machinery, perform activities at heights, swimming or participation in water activities or provide baby-sitting services while on Pain, Sleep and Anxiety Medications; until their outpatient Physician has advised to do so again. Also recommended to not to take more than prescribed Pain, Sleep and Anxiety Medications.  Consultants: None Procedures performed: 2D echo Disposition: Home Diet recommendation:  Discharge Diet Orders (From admission, onward)     Start     Ordered   12/06/22 0000  Diet Carb Modified        12/06/22 1324            DISCHARGE MEDICATION: Allergies as of 12/06/2022       Reactions   Avandia [rosiglitazone Maleate] Swelling, Other (See Comments)   Lower extremities became swollen  to baseline, no shortness of breath, orthopnea or PND, feeling better.  Ready go home today.  BP 96/69 (BP Location: Left Arm)   Pulse 85   Temp 98.2 F (36.8 C) (Oral)   Resp 18   Ht 5' 10.5" (1.791 m)   Wt 87 kg   SpO2 95%   BMI 27.13 kg/m    Physical Exam General: Alert and oriented x 3, NAD Cardiovascular: S1 S2 clear, RRR.  Respiratory: CTAB, no wheezing, rales Gastrointestinal: Soft, nontender, nondistended, NBS Ext: no pedal edema bilaterally Neuro: no new deficits Psych: Normal affect     Condition at discharge: fair  The results of significant diagnostics from this hospitalization (including imaging, microbiology, ancillary and laboratory) are listed below for reference.   Imaging Studies: ECHOCARDIOGRAM LIMITED  Result Date: 12/05/2022    ECHOCARDIOGRAM LIMITED REPORT   Patient Name:   Alan Wang Date of Exam: 12/05/2022 Medical Rec #:  [id number]     Height:       70.5 in Accession #:    [id number]    Weight:       196.0 lb Date of Birth:  1950/08/05     BSA:          2.080 m Patient Age:    72 years      BP:           138/86 mmHg Patient Gender: M              HR:           87 bpm. Exam Location:  Inpatient Procedure: Limited Echo, Color Doppler and Cardiac Doppler Indications:    CHF  History:        Patient has prior history of Echocardiogram examinations, most                 recent 08/23/2022. CHF, CAD, TIA; Risk Factors:Hypertension,                 Diabetes and Dyslipidemia.  Sonographer:    Milbert Coulter Referring Phys: [id number] JESSICA U VANN IMPRESSIONS  1. Left ventricular ejection fraction, by estimation, is 30 to 35%. The left ventricle has moderately decreased function. Left ventricular endocardial border not optimally defined to evaluate regional wall motion. There is mild left ventricular hypertrophy. Left ventricular diastolic parameters are consistent with Grade I diastolic dysfunction (impaired relaxation).  2. Right ventricle is poorly visualized but appears grossly normal size and systolic function  3. Technically difficult study. Consider repeating limited study with contrast for better evaluation of LV systolic function FINDINGS  Left Ventricle: Left ventricular ejection fraction, by estimation, is 30 to 35%. The left ventricle has moderately decreased function. Left ventricular endocardial border not optimally defined to evaluate regional wall motion. The left ventricular internal cavity size was normal in size. There is mild left ventricular hypertrophy. Left ventricular diastolic parameters are consistent with Grade I diastolic dysfunction (impaired relaxation). Right Ventricle: The right ventricular size is normal. No increase in right ventricular wall thickness. Right ventricular systolic function is normal. Pericardium: There is no evidence of pericardial effusion. Mitral Valve: The mitral valve is grossly normal. Trivial mitral valve regurgitation. No evidence of mitral valve stenosis. Aortic Valve: The aortic valve was not well visualized. Additional Comments: Spectral Doppler performed. Color Doppler performed.  LEFT VENTRICLE PLAX 2D LVIDd:          4.50 cm Diastology LVIDs:         4.30 cm LV e' medial:

## 2022-12-07 ENCOUNTER — Ambulatory Visit: Payer: Medicare HMO | Admitting: Cardiology

## 2022-12-17 DIAGNOSIS — Z23 Encounter for immunization: Secondary | ICD-10-CM | POA: Diagnosis not present

## 2022-12-17 DIAGNOSIS — I5023 Acute on chronic systolic (congestive) heart failure: Secondary | ICD-10-CM | POA: Diagnosis not present

## 2022-12-20 ENCOUNTER — Ambulatory Visit: Payer: Medicare HMO | Admitting: Cardiology

## 2022-12-23 NOTE — Progress Notes (Signed)
Cardiology Clinic Note   Patient Name: Alan Wang Date of Encounter: 12/24/2022  Primary Care Provider:  Henrine Screws, MD Primary Cardiologist:  Yates Decamp, MD  Patient Profile    72 year old male with history of coronary artery disease hypertension chronic systolic CHF, diabetes type 2, hyperlipidemia on Repatha, and moderate malnutrition.  Recent discharge on 12/06/2022 for acute on chronic systolic heart failure EF 30 to 35% per echocardiogram on 12/05/2022  Past Medical History    Past Medical History:  Diagnosis Date   Arthritis    Asthma    Cancer (HCC)    skin   Coronary artery disease    COVID    Diabetes mellitus    type 2, insulin dependent for 30+ years   History of kidney stones 11/2012   Hyperlipemia    Hypertension    IDDM (insulin dependent diabetes mellitus)    Shortness of breath    Stroke (HCC)    hx TIA   Past Surgical History:  Procedure Laterality Date   ANGIOPLASTY  04/06/2011   x3    CARDIAC CATHETERIZATION     CARDIAC CATHETERIZATION N/A 09/16/2015   Procedure: Left Heart Cath and Coronary Angiography;  Surgeon: Yates Decamp, MD;  Location: [facility] INVASIVE CV LAB;  Service: Cardiovascular;  Laterality: N/A;   cataracts     CHOLECYSTECTOMY     HERNIA REPAIR     x2   KNEE ARTHROSCOPY     LEFT HEART CATHETERIZATION WITH CORONARY ANGIOGRAM N/A 03/08/2011   Procedure: LEFT HEART CATHETERIZATION WITH CORONARY ANGIOGRAM;  Surgeon: Pamella Pert, MD;  Location: [facility] CATH LAB;  Service: Cardiovascular;  Laterality: N/A;   PERCUTANEOUS CORONARY STENT INTERVENTION (PCI-S) N/A 04/06/2011   Procedure: PERCUTANEOUS CORONARY STENT INTERVENTION (PCI-S);  Surgeon: Pamella Pert, MD;  Location: [facility] CATH LAB;  Service: Cardiovascular;  Laterality: N/A;   TOTAL SHOULDER ARTHROPLASTY Right 01/07/2022   Procedure: TOTAL SHOULDER ARTHROPLASTY;  Surgeon: Francena Hanly, MD;  Location: [facility] ORS;  Service: Orthopedics;  Laterality: Right;      Allergies  Allergies  Allergen Reactions   Avandia [Rosiglitazone Maleate] Swelling and Other (See Comments)    Lower extremities became swollen    History of Present Illness    We are seeing Alan Wang posthospitalization follow-up in the setting of acute on chronic systolic heart failure with EF of 30-3 5% per echocardiogram, IV diuresis of 9 pounds, with discharge weight of 191 pounds.,There was reported nonadherent to medication regimen.  He was discharged on Lasix 40 mg daily, Coreg 6.25 mg twice daily, Entresto 24 to 26 mg twice daily and Jardiance 10 mg daily.    He comes today with complaints of fatigue and lack of muscle strength.  He can be a little unsteady on his feet.  He has been medically compliant with all of the new medications and is getting used to them.  He is adhering for the most part to a low-sodium diet occasionally adding a little bit of salt to tomatoes or to grits.  He has not had to take any extra doses of furosemide for weight gain.  At home his weight is send at 188 pounds.  He denies dizziness, near-syncope, palpitations, does have some mild dyspnea on exertion.  His wife is gotten him a cane with a seat.  He would like to be able to exercise more but just does not have the overall strength to do so at this time.  Home Medications    Current Outpatient Medications  Medication Sig Dispense Refill   acetaminophen (TYLENOL) 650 MG CR tablet Take 1,300 mg by mouth 2 (two) times daily.     Apoaequorin (PREVAGEN PO) Take 1 capsule by mouth daily.     Ascorbic Acid (VITAMIN C) 1000 MG tablet Take 1,000 mg by mouth daily.     aspirin (ASPIRIN CHILDRENS) 81 MG chewable tablet Chew 1 tablet (81 mg total) by mouth daily.     carvedilol (COREG) 6.25 MG tablet Take 1 tablet (6.25 mg total) by mouth 2 (two) times daily with a meal. 60 tablet 2   Cholecalciferol (VITAMIN D) 2000 units CAPS Take 2,000 Units by mouth daily.     CINNAMON PO Take 2,000 mg by mouth 2 (two)  times daily.     diclofenac Sodium (VOLTAREN) 1 % GEL Apply 2 g topically 4 (four) times daily. Apply to right shoulder 50 g 2   DULoxetine (CYMBALTA) 30 MG capsule Take 30 mg by mouth at bedtime.     furosemide (LASIX) 40 MG tablet Take 1 tablet (40 mg total) by mouth daily. 30 tablet 2   insulin NPH (HUMULIN N,NOVOLIN N) 100 UNIT/ML injection Inject 10-25 Units into the skin See admin instructions. Inject 10 units into the skin before breakfast and 25 units before supper     insulin regular (NOVOLIN R,HUMULIN R) 100 units/mL injection Inject 5-15 Units into the skin See admin instructions. Inject 5 units into the skin before breakfast and 15 units before supper     JARDIANCE 10 MG TABS tablet Take 10 mg by mouth daily.     Magnesium 500 MG TABS Take 500 mg by mouth daily.     MOUNJARO 5 MG/0.5ML Pen Inject 2.5 mg into the skin once a week.     nitroGLYCERIN (NITROSTAT) 0.4 MG SL tablet Place 1 tablet (0.4 mg total) under the tongue every 5 (five) minutes as needed for chest pain. 30 tablet 0   REPATHA SURECLICK 140 MG/ML SOAJ Inject 140 mg as directed every 14 (fourteen) days.     sacubitril-valsartan (ENTRESTO) 24-26 MG Take 1 tablet by mouth 2 (two) times daily.     traMADol (ULTRAM) 50 MG tablet Take 1 tablet (50 mg total) by mouth every 6 (six) hours as needed for moderate pain. (Patient taking differently: Take 100 mg by mouth every 6 (six) hours as needed for moderate pain (pain score 4-6).) 20 tablet 0   TURMERIC PO Take 1 capsule by mouth daily.     No current facility-administered medications for this visit.     Family History    Family History  Problem Relation Age of Onset   Emphysema Mother    COPD Mother    Alzheimer's disease Father    Asthma Paternal Aunt    Heart attack Paternal Grandfather    He indicated that his mother is deceased. He indicated that his father is deceased. He indicated that his sister is alive. He indicated that the status of his paternal grandfather  is unknown. He indicated that the status of his paternal aunt is unknown.  Social History    Social History   Socioeconomic History   Marital status: Married    Spouse name: Bonita Quin   Number of children: 2   Years of education: [employer] education level: Not on file  Occupational History   Occupation: retired    Comment: Firefighter  Tobacco Use   Smoking status: Never   Smokeless tobacco: Never  Advertising account planner  Vaping status: Never Used  Substance and Sexual Activity   Alcohol use: Yes    Comment: occasionally   Drug use: No   Sexual activity: Yes  Other Topics Concern   Not on file  Social History Narrative   Patient lives at home with his spouse.   Caffeine Use: occasionally   Social Determinants of Health   Financial Resource Strain: Not on file  Food Insecurity: No Food Insecurity (12/04/2022)   Hunger Vital Sign    Worried About Running Out of Food in the Last Year: Never true    Ran Out of Food in the Last Year: Never true  Transportation Needs: No Transportation Needs (12/04/2022)   PRAPARE - Administrator, Civil Service (Medical): No    Lack of Transportation (Non-Medical): No  Physical Activity: Not on file  Stress: Not on file  Social Connections: Not on file  Intimate Partner Violence: Not At Risk (12/04/2022)   Humiliation, Afraid, Rape, and Kick questionnaire    Fear of Current or Ex-Partner: No    Emotionally Abused: No    Physically Abused: No    Sexually Abused: No     Review of Systems    General:  No chills, fever, night sweats or weight changes.  Positive for fatigue and muscle weakness. Cardiovascular:  No chest pain, dyspnea on exertion, edema, orthopnea, palpitations, paroxysmal nocturnal dyspnea. Dermatological: No rash, lesions/masses Respiratory: No cough, dyspnea Urologic: No hematuria, dysuria Abdominal:   No nausea, vomiting, diarrhea, bright red blood per rectum, melena, or hematemesis Neurologic:  No  visual changes, wkns, changes in mental status. All other systems reviewed and are otherwise negative except as noted above.       Physical Exam    VS:  BP 100/62 (BP Location: Left Arm, Patient Position: Sitting, Cuff Size: Normal)   Pulse 86   Ht 5\' 9"  (1.753 m)   Wt 193 lb (87.5 kg)   BMI 28.50 kg/m  , BMI Body mass index is 28.5 kg/m.     GEN: Well nourished, well developed, in no acute distress. HEENT: normal. Neck: Supple, no JVD, carotid bruits, or masses. Cardiac: RRR, soft systolic murmur heard best at the left sternal border, murmurs, rubs, or gallops. No clubbing, cyanosis, edema.  Radials/DP/PT 2+ and equal bilaterally.  Respiratory:  Respirations regular and unlabored, clear to auscultation bilaterally. GI: Soft, nontender, nondistended, BS + x 4. MS: no deformity or atrophy. Skin: warm and dry, no rash.  Pale Neuro:  Strength and sensation are intact. Psych: Normal affect.      Lab Results  Component Value Date   WBC 7.6 12/06/2022   HGB 18.0 (H) 12/06/2022   HCT 55.1 (H) 12/06/2022   MCV 89.9 12/06/2022   PLT 180 12/06/2022   Lab Results  Component Value Date   CREATININE 0.59 (L) 12/06/2022   BUN 27 (H) 12/06/2022   NA 137 12/06/2022   K 3.8 12/06/2022   CL 103 12/06/2022   CO2 22 12/06/2022   Lab Results  Component Value Date   ALT 15 12/05/2022   AST 17 12/05/2022   ALKPHOS 61 12/05/2022   BILITOT 1.2 12/05/2022   Lab Results  Component Value Date   CHOL 115 06/23/2016   HDL 40 (L) 06/23/2016   LDLCALC 59 06/23/2016   TRIG 80 06/23/2016   CHOLHDL 2.9 06/23/2016    Lab Results  Component Value Date   HGBA1C 6.8 (H) 12/04/2022     Review of Prior Studies  Echocardiogram 12/05/2022 1. Left ventricular ejection fraction, by estimation, is 30 to 35%. The  left ventricle has moderately decreased function. Left ventricular  endocardial border not optimally defined to evaluate regional wall motion.  There is mild left ventricular   hypertrophy. Left ventricular diastolic parameters are consistent with  Grade I diastolic dysfunction (impaired relaxation).   2. Right ventricle is poorly visualized but appears grossly normal size  and systolic function   3. Technically difficult study. Consider repeating limited study with  contrast for better evaluation of LV systolic function    Assessment & Plan   1.  HFrEF: Recent hospitalization for acute systolic CHF, echocardiogram revealing EF of 30 to 35%.  He is now on GDMT and is tolerating medications currently.  His main complaint is generalized fatigue and weakness, with inability to do a lot of activity due to associated shortness of breath.  His wife bought a Audiological scientist that goes upstairs that he may sit in which has been helpful.  He also has a cane to use if necessary.  Will need repeat echocardiogram in 3 months.  Will not titrate up Entresto due to soft blood pressure.  I have advised him if he has any dizziness, presyncope, or significantly low blood pressure.  We may need to back off on the Lasix from 40 mg daily to 20 mg daily as he is also on Jardiance 10 mg daily to avoid overdiuresis.  Currently blood pressure is low normal for his reduced EF.  BMET is ordered today to check kidney function.  2.  History of CAD: PTCA of the proximal mid circumflex with drug-eluting stent placement, stent to ramus intermediate, stent to mid LAD.  Most recent cardiac catheterization 2017 patent stents.  Due to reduced EF can consider repeating catheterization at the discretion of Dr. Jacinto Halim.  3.  Insulin-dependent diabetes: Followed by endocrinology.  4.  Hypercholesterolemia: Goal of LDL less than 70 with multiple stents.  He remains on aspirin 81 mg.  Continue secondary prevention with low-cholesterol diet.         Signed, Bettey Mare. Liborio Nixon, ANP, AACC   12/24/2022 10:44 AM      Office [phone] Fax [phone]  Notice: This dictation was prepared with  Dragon dictation along with smaller phrase technology. Any transcriptional errors that result from this process are unintentional and may not be corrected upon review.

## 2022-12-24 ENCOUNTER — Encounter: Payer: Self-pay | Admitting: Adult Health

## 2022-12-24 ENCOUNTER — Ambulatory Visit: Payer: Medicare HMO | Attending: Cardiology | Admitting: Adult Health

## 2022-12-24 VITALS — BP 100/62 | HR 86 | Ht 69.0 in | Wt 193.0 lb

## 2022-12-24 DIAGNOSIS — Z79899 Other long term (current) drug therapy: Secondary | ICD-10-CM | POA: Diagnosis not present

## 2022-12-24 DIAGNOSIS — I509 Heart failure, unspecified: Secondary | ICD-10-CM

## 2022-12-24 NOTE — Patient Instructions (Signed)
Medication Instructions:  NO CHANGES     Lab Work: BMET TO BE DONE TODAY   Testing/Procedures: NONE   Follow-Up: At [facility], you and your health needs are our priority.  As part of our continuing mission to provide you with exceptional heart care, we have created designated Provider Care Teams.  These Care Teams include your primary Cardiologist (physician) and Advanced Practice Providers (APPs -  Physician Assistants and Nurse Practitioners) who all work together to provide you with the care you need, when you need it.   Your next appointment:   KEEP UPCOMING APPOINTMENT WITH DR. Jacinto Halim  Provider:   Joni Reining

## 2022-12-25 LAB — BASIC METABOLIC PANEL
BUN/Creatinine Ratio: 22 (ref 10–24)
BUN: 20 mg/dL (ref 8–27)
CO2: 25 mmol/L (ref 20–29)
Calcium: 9.4 mg/dL (ref 8.6–10.2)
Chloride: 104 mmol/L (ref 96–106)
Creatinine, Ser: 0.9 mg/dL (ref 0.76–1.27)
Glucose: 216 mg/dL — ABNORMAL HIGH (ref 70–99)
Potassium: 4.7 mmol/L (ref 3.5–5.2)
Sodium: 144 mmol/L (ref 134–144)
eGFR: 90 mL/min/{1.73_m2} (ref >59)

## 2022-12-30 ENCOUNTER — Telehealth: Payer: Self-pay

## 2022-12-30 NOTE — Telephone Encounter (Signed)
Called patient regarding scheduling appointment to schedule Heart Cath. Patient scheduled for Tuesday November 5 at 1:55 pm with Azalee Course, PA-C.

## 2022-12-30 NOTE — Telephone Encounter (Signed)
Called patient to schedule appointment for patient to come to office  to discuss and schedule Heart Cath. Left message for patient to call office.

## 2022-12-30 NOTE — Telephone Encounter (Addendum)
Called patient regarding results. Left message for patient to call office to schedule appointment prior to scheduling Heart Cath.----- Message from Joni Reining sent at 12/27/2022  7:24 AM EDT ----- Kidney function is improved on the GDMT with Jardiance. Good report.  No changes in his regimen.   KL

## 2022-12-31 DIAGNOSIS — E785 Hyperlipidemia, unspecified: Secondary | ICD-10-CM | POA: Diagnosis not present

## 2022-12-31 DIAGNOSIS — E119 Type 2 diabetes mellitus without complications: Secondary | ICD-10-CM | POA: Diagnosis not present

## 2022-12-31 DIAGNOSIS — M150 Primary generalized (osteo)arthritis: Secondary | ICD-10-CM | POA: Diagnosis not present

## 2022-12-31 DIAGNOSIS — I2510 Atherosclerotic heart disease of native coronary artery without angina pectoris: Secondary | ICD-10-CM | POA: Diagnosis not present

## 2022-12-31 DIAGNOSIS — I10 Essential (primary) hypertension: Secondary | ICD-10-CM | POA: Diagnosis not present

## 2023-01-03 ENCOUNTER — Telehealth: Payer: Self-pay

## 2023-01-03 NOTE — Telephone Encounter (Addendum)
Called patient regarding results. Spoke with patients spouse Bonita Quin, to advise will need to schedule heart Cath and patient will need to come into office to discuss and schedule. Wife had understanding of results.----- Message from Joni Reining sent at 12/27/2022  7:24 AM EDT ----- Kidney function is improved on the GDMT with Jardiance. Good report.  No changes in his regimen.   KL

## 2023-01-10 DIAGNOSIS — G609 Hereditary and idiopathic neuropathy, unspecified: Secondary | ICD-10-CM | POA: Diagnosis not present

## 2023-01-10 DIAGNOSIS — E1165 Type 2 diabetes mellitus with hyperglycemia: Secondary | ICD-10-CM | POA: Diagnosis not present

## 2023-01-10 DIAGNOSIS — I10 Essential (primary) hypertension: Secondary | ICD-10-CM | POA: Diagnosis not present

## 2023-01-10 DIAGNOSIS — E7800 Pure hypercholesterolemia, unspecified: Secondary | ICD-10-CM | POA: Diagnosis not present

## 2023-01-11 ENCOUNTER — Encounter: Payer: Self-pay | Admitting: Physician Assistant

## 2023-01-11 ENCOUNTER — Other Ambulatory Visit: Payer: Self-pay | Admitting: Physician Assistant

## 2023-01-11 ENCOUNTER — Ambulatory Visit: Payer: Medicare HMO | Attending: Physician Assistant | Admitting: Physician Assistant

## 2023-01-11 VITALS — BP 108/67 | HR 76 | Ht 71.0 in | Wt 196.0 lb

## 2023-01-11 DIAGNOSIS — E119 Type 2 diabetes mellitus without complications: Secondary | ICD-10-CM | POA: Diagnosis not present

## 2023-01-11 DIAGNOSIS — I10 Essential (primary) hypertension: Secondary | ICD-10-CM | POA: Diagnosis not present

## 2023-01-11 DIAGNOSIS — E785 Hyperlipidemia, unspecified: Secondary | ICD-10-CM | POA: Diagnosis not present

## 2023-01-11 DIAGNOSIS — Z01818 Encounter for other preprocedural examination: Secondary | ICD-10-CM

## 2023-01-11 DIAGNOSIS — I519 Heart disease, unspecified: Secondary | ICD-10-CM | POA: Diagnosis not present

## 2023-01-11 DIAGNOSIS — I2510 Atherosclerotic heart disease of native coronary artery without angina pectoris: Secondary | ICD-10-CM

## 2023-01-11 NOTE — Progress Notes (Signed)
Cardiology Office Note:  .   Date:  01/11/2023  ID:  Alan Wang, DOB January 01, 1951, MRN [id number] PCP: Henrine Screws, MD  [facility] Providers Cardiologist:  Yates Decamp, MD     History of Present Illness: [name]   Alan Wang is a 72 y.o. male with PMH of CAD s/p PCI of LAD, left circumflex and ramus, hypertension, hyperlipidemia and DM2 with diabetic retinopathy.  Last cardiac catheterization in July 2017 after abnormal stress test revealed flush occlusion of moderate-sized PLV branch of RCA but with excellent distal collaterals.  Patient was admitted at [facility] in June 2020 for with acute metabolic encephalopathy after being diagnosed with COVID-19 infection.  He has a new onset of cardiomyopathy with severe LV systolic dysfunction.  He was last seen by Dr. Jacinto Halim on 11/09/2022 at which point he was placed on moderate dose on Entresto 49/51 mg twice a day.  Carvedilol was increased from 6.25 mg twice a day to 12.5 mg twice a day.  He ended up readmitted to the hospital near the end of September 2024 with acute on chronic systolic heart failure.  According to family member, he had ran out of Entresto prior to the hospitalization.  He was treated with IV Lasix and then discharged with a dry weight of 191 pounds.  Repeat limited echocardiogram obtained on 12/05/2022 showed EF 30 to 35%, mild LVH, grade 1 DD.  Since discharge, patient was seen by Joni Reining, NP on 12/24/2022, per patient at the time his home weight was 188 pounds.  He was also on the low-dose of Entresto 24-26 mg twice a day.  He had a lot of fatigue and weakness.  48-month repeat echocardiogram was recommended.  Repeat basic metabolic panel shows creatinine increased from 0.59 in the hospital to 0.90.  Joni Reining NP and Dr. Jacinto Halim recommended cardiac catheterization given persistently low EF.  Patient presents today for follow-up.  He has no lower extremity edema, orthopnea or PND.  He is euvolemic on exam.  I  discussed with them the reasoning behind cardiac catheterization.  I explained the risk and the benefit of cardiac catheterization, he was agreeable to proceed.  He is still concerned that he continued to have weakness and balance issue.  As this has been going on for over a year.  He says he is quite sedentary and does not do much activity.  I suspect this is due to combination of low EF and deconditioning.  Depend on his current result, he may ended up being set up with cardiac rehab, if he does not get cardiac rehab, then we will refer the patient to physical therapy on the next visit.   ROS:   He denies chest pain, palpitations, dyspnea, pnd, orthopnea, n, v, dizziness, syncope, edema, weight gain, or early satiety. All other systems reviewed and are otherwise negative except as noted above.    Studies Reviewed: [name]   EKG Interpretation Date/Time:  Tuesday January 11 2023 13:41:37 EST Ventricular Rate:  76 PR Interval:  174 QRS Duration:  86 QT Interval:  396 QTC Calculation: 445 R Axis:   -15  Text Interpretation: Sinus rhythm with occasional Premature ventricular complexes Low voltage QRS Nonspecific T wave abnormality When compared with ECG of 05-Dec-2022 06:28, No significant change was found Confirmed by Azalee Course [phone]) on 01/11/2023 2:40:59 PM    Cardiac Studies & Procedures   CARDIAC CATHETERIZATION  CARDIAC CATHETERIZATION 09/16/2015  Narrative Coronary angiogram 09/16/2015: H/O PTCA 04/06/11: Proximal  and mid circumflex 2.75x22 mm Resolute stent. 03/08/11: Stent to large Ramus intermediate with 3.5x15 Integrity stent. Mid LAD 3.0x23 and 2.75x12 Xience placed in 05/2008. All of them are widely patent.  Mild diffuse coronary artery disease otherwise. Flush occluded PL branch of the right coronary artery, moderate area of distribution but very small calibered with multiple branches. Circumflex gives collaterals. Not amenable for PCI. The stress test ischemia correlates with the  lesion.  Rec: Medical therapy with aggressive risk factor reduction.  Disposition: Will be discharged home today with outpatient follow up.  Findings Coronary Findings Diagnostic  Dominance: Right  Left Anterior Descending Previously placed Prox LAD to Dist LAD drug eluting stent is patent.  Mid LAD 3.0x23 and 2.75x12 Xience placed in 05/2008.  Ramus Intermedius Previously placed Ost Ramus to Ramus drug eluting stent is patent. 03/08/11: Stent to large Ramus intermediate with 3.5x15 Integrity stent.  Left Circumflex Previously placed Ost Cx to Mid Cx drug eluting stent is patent.  PTCA 04/06/11: Proximal and mid circumflex 2.75x22 mm Resolute stent.  Right Coronary Artery  Right Posterior Descending Artery  Right Posterior Atrioventricular Artery Collaterals RPAV filled by collaterals from Dist Cx.  Collaterals RPAV filled by collaterals from Mid Cx.  Chronic total occlusionand has left-to-right collateral flow.  Intervention  No interventions have been documented.     ECHOCARDIOGRAM  ECHOCARDIOGRAM LIMITED 12/05/2022  Narrative ECHOCARDIOGRAM LIMITED REPORT    Patient Name:   Alan Wang Date of Exam: 12/05/2022 Medical Rec #:  [id number]     Height:       70.5 in Accession #:    [id number]    Weight:       196.0 lb Date of Birth:  1950/07/03     BSA:          2.080 m Patient Age:    72 years      BP:           138/86 mmHg Patient Gender: M             HR:           87 bpm. Exam Location:  Inpatient  Procedure: Limited Echo, Color Doppler and Cardiac Doppler  Indications:    CHF  History:        Patient has prior history of Echocardiogram examinations, most recent 08/23/2022. CHF, CAD, TIA; Risk Factors:Hypertension, Diabetes and Dyslipidemia.  Sonographer:    Milbert Coulter Referring Phys: [id number] JESSICA U VANN  IMPRESSIONS   1. Left ventricular ejection fraction, by estimation, is 30 to 35%. The left ventricle has moderately decreased function. Left  ventricular endocardial border not optimally defined to evaluate regional wall motion. There is mild left ventricular hypertrophy. Left ventricular diastolic parameters are consistent with Grade I diastolic dysfunction (impaired relaxation). 2. Right ventricle is poorly visualized but appears grossly normal size and systolic function 3. Technically difficult study. Consider repeating limited study with contrast for better evaluation of LV systolic function  FINDINGS Left Ventricle: Left ventricular ejection fraction, by estimation, is 30 to 35%. The left ventricle has moderately decreased function. Left ventricular endocardial border not optimally defined to evaluate regional wall motion. The left ventricular internal cavity size was normal in size. There is mild left ventricular hypertrophy. Left ventricular diastolic parameters are consistent with Grade I diastolic dysfunction (impaired relaxation).  Right Ventricle: The right ventricular size is normal. No increase in right ventricular wall thickness. Right ventricular systolic function is normal.  Pericardium: There is no evidence of pericardial  effusion.  Mitral Valve: The mitral valve is grossly normal. Trivial mitral valve regurgitation. No evidence of mitral valve stenosis.  Aortic Valve: The aortic valve was not well visualized.  Additional Comments: Spectral Doppler performed. Color Doppler performed.  LEFT VENTRICLE PLAX 2D LVIDd:         4.50 cm Diastology LVIDs:         4.30 cm LV e' medial:    4.57 cm/s LV PW:         1.10 cm LV E/e' medial:  12.5 LV IVS:        1.10 cm LV e' lateral:   6.64 cm/s LV E/e' lateral: 8.6   RIGHT VENTRICLE RV S prime:     15.30 cm/s TAPSE (M-mode): 1.6 cm  LEFT ATRIUM         Index LA diam:    3.90 cm 1.87 cm/m  AORTA Ao Root diam: 3.80 cm  MITRAL VALVE MV Area (PHT): 4.06 cm MV Decel Time: 187 msec MV E velocity: 57.00 cm/s MV A velocity: 81.00 cm/s MV E/A ratio:   0.70  Epifanio Lesches MD Electronically signed by Epifanio Lesches MD Signature Date/Time: 12/05/2022/4:11:30 PM    Final             Risk Assessment/Calculations:             Physical Exam:   VS:  BP 108/67 (BP Location: Left Arm, Patient Position: Sitting, Cuff Size: Normal)   Pulse 76   Ht 5\' 11"  (1.803 m)   Wt 196 lb (88.9 kg)   SpO2 95%   BMI 27.34 kg/m    Wt Readings from Last 3 Encounters:  01/11/23 196 lb (88.9 kg)  12/24/22 193 lb (87.5 kg)  12/06/22 191 lb 12.8 oz (87 kg)    GEN: Well nourished, well developed in no acute distress NECK: No JVD; No carotid bruits CARDIAC: RRR, no murmurs, rubs, gallops RESPIRATORY:  Clear to auscultation without rales, wheezing or rhonchi  ABDOMEN: Soft, non-tender, non-distended EXTREMITIES:  No edema; No deformity   ASSESSMENT AND PLAN: .    LV dysfunction  Decreased ejection fraction (EF) from 50-55% in 2018 to 25-30% in June 2024, with a slight improvement to 30-35% in September 2024. Despite initiation of Entresto, EF remains below normal. Differential diagnosis includes ischemic cardiomyopathy vs non-ischemic cardiomyopathy. -Schedule cardiac catheterization in the next two weeks to differentiate between ischemic and non-ischemic cardiomyopathy. -Order Basal Metabolic Panel and CBC today. -Continue Carvedilol and Jardiance. -Hold Furosemide for two days prior to the catheterization. -Hold Entresto on the morning of the procedure.  CAD: Patient denies any recent chest pain.  The last cardiac catheterization was in July 2017 that showed patent stents, occluded PLA branch of RCA with distal collaterals.  Not amenable to PCI.  Medical therapy was recommended.  Hypertension: Blood pressure well-controlled  Hyperlipidemia: On Repatha  DM2: Managed by primary care provider    Informed Consent   Shared Decision Making/Informed Consent The risks [stroke (1 in 1000), death (1 in 1000), kidney failure [usually  temporary] (1 in 500), bleeding (1 in 200), allergic reaction [possibly serious] (1 in 200)], benefits (diagnostic support and management of coronary artery disease) and alternatives of a cardiac catheterization were discussed in detail with Mr. Treese and he is willing to proceed.     Dispo: Follow-up with Dr. Jacinto Halim as previously scheduled  Signed, Azalee Course, [place]

## 2023-01-11 NOTE — H&P (View-Only) (Signed)
 Cardiology Office Note:  .   Date:  01/11/2023  ID:  DAJON LAZAR, DOB January 01, 1951, MRN [id number] PCP: Henrine Screws, MD  [facility] Providers Cardiologist:  Yates Decamp, MD     History of Present Illness: Alan Wang   Alan Wang is a 72 y.o. male with PMH of CAD s/p PCI of LAD, left circumflex and ramus, hypertension, hyperlipidemia and DM2 with diabetic retinopathy.  Last cardiac catheterization in July 2017 after abnormal stress test revealed flush occlusion of moderate-sized PLV branch of RCA but with excellent distal collaterals.  Patient was admitted at [facility] in June 2020 for with acute metabolic encephalopathy after being diagnosed with COVID-19 infection.  He has a new onset of cardiomyopathy with severe LV systolic dysfunction.  He was last seen by Dr. Jacinto Halim on 11/09/2022 at which point he was placed on moderate dose on Entresto 49/51 mg twice a day.  Carvedilol was increased from 6.25 mg twice a day to 12.5 mg twice a day.  He ended up readmitted to the hospital near the end of September 2024 with acute on chronic systolic heart failure.  According to family member, he had ran out of Entresto prior to the hospitalization.  He was treated with IV Lasix and then discharged with a dry weight of 191 pounds.  Repeat limited echocardiogram obtained on 12/05/2022 showed EF 30 to 35%, mild LVH, grade 1 DD.  Since discharge, patient was seen by Joni Reining, NP on 12/24/2022, per patient at the time his home weight was 188 pounds.  He was also on the low-dose of Entresto 24-26 mg twice a day.  He had a lot of fatigue and weakness.  48-month repeat echocardiogram was recommended.  Repeat basic metabolic panel shows creatinine increased from 0.59 in the hospital to 0.90.  Joni Reining NP and Dr. Jacinto Halim recommended cardiac catheterization given persistently low EF.  Patient presents today for follow-up.  He has no lower extremity edema, orthopnea or PND.  He is euvolemic on exam.  I  discussed with them the reasoning behind cardiac catheterization.  I explained the risk and the benefit of cardiac catheterization, he was agreeable to proceed.  He is still concerned that he continued to have weakness and balance issue.  As this has been going on for over a year.  He says he is quite sedentary and does not do much activity.  I suspect this is due to combination of low EF and deconditioning.  Depend on his current result, he may ended up being set up with cardiac rehab, if he does not get cardiac rehab, then we will refer the patient to physical therapy on the next visit.   ROS:   He denies chest pain, palpitations, dyspnea, pnd, orthopnea, n, v, dizziness, syncope, edema, weight gain, or early satiety. All other systems reviewed and are otherwise negative except as noted above.    Studies Reviewed: Alan Wang   EKG Interpretation Date/Time:  Tuesday January 11 2023 13:41:37 EST Ventricular Rate:  76 PR Interval:  174 QRS Duration:  86 QT Interval:  396 QTC Calculation: 445 R Axis:   -15  Text Interpretation: Sinus rhythm with occasional Premature ventricular complexes Low voltage QRS Nonspecific T wave abnormality When compared with ECG of 05-Dec-2022 06:28, No significant change was found Confirmed by Azalee Course [phone]) on 01/11/2023 2:40:59 PM    Cardiac Studies & Procedures   CARDIAC CATHETERIZATION  CARDIAC CATHETERIZATION 09/16/2015  Narrative Coronary angiogram 09/16/2015: H/O PTCA 04/06/11: Proximal  and mid circumflex 2.75x22 mm Resolute stent. 03/08/11: Stent to large Ramus intermediate with 3.5x15 Integrity stent. Mid LAD 3.0x23 and 2.75x12 Xience placed in 05/2008. All of them are widely patent.  Mild diffuse coronary artery disease otherwise. Flush occluded PL branch of the right coronary artery, moderate area of distribution but very small calibered with multiple branches. Circumflex gives collaterals. Not amenable for PCI. The stress test ischemia correlates with the  lesion.  Rec: Medical therapy with aggressive risk factor reduction.  Disposition: Will be discharged home today with outpatient follow up.  Findings Coronary Findings Diagnostic  Dominance: Right  Left Anterior Descending Previously placed Prox LAD to Dist LAD drug eluting stent is patent.  Mid LAD 3.0x23 and 2.75x12 Xience placed in 05/2008.  Ramus Intermedius Previously placed Ost Ramus to Ramus drug eluting stent is patent. 03/08/11: Stent to large Ramus intermediate with 3.5x15 Integrity stent.  Left Circumflex Previously placed Ost Cx to Mid Cx drug eluting stent is patent.  PTCA 04/06/11: Proximal and mid circumflex 2.75x22 mm Resolute stent.  Right Coronary Artery  Right Posterior Descending Artery  Right Posterior Atrioventricular Artery Collaterals RPAV filled by collaterals from Dist Cx.  Collaterals RPAV filled by collaterals from Mid Cx.  Chronic total occlusionand has left-to-right collateral flow.  Intervention  No interventions have been documented.     ECHOCARDIOGRAM  ECHOCARDIOGRAM LIMITED 12/05/2022  Narrative ECHOCARDIOGRAM LIMITED REPORT    Patient Name:   Alan Wang Date of Exam: 12/05/2022 Medical Rec #:  [id number]     Height:       70.5 in Accession #:    [id number]    Weight:       196.0 lb Date of Birth:  1950/07/03     BSA:          2.080 m Patient Age:    72 years      BP:           138/86 mmHg Patient Gender: M             HR:           87 bpm. Exam Location:  Inpatient  Procedure: Limited Echo, Color Doppler and Cardiac Doppler  Indications:    CHF  History:        Patient has prior history of Echocardiogram examinations, most recent 08/23/2022. CHF, CAD, TIA; Risk Factors:Hypertension, Diabetes and Dyslipidemia.  Sonographer:    Milbert Coulter Referring Phys: [id number] JESSICA U VANN  IMPRESSIONS   1. Left ventricular ejection fraction, by estimation, is 30 to 35%. The left ventricle has moderately decreased function. Left  ventricular endocardial border not optimally defined to evaluate regional wall motion. There is mild left ventricular hypertrophy. Left ventricular diastolic parameters are consistent with Grade I diastolic dysfunction (impaired relaxation). 2. Right ventricle is poorly visualized but appears grossly normal size and systolic function 3. Technically difficult study. Consider repeating limited study with contrast for better evaluation of LV systolic function  FINDINGS Left Ventricle: Left ventricular ejection fraction, by estimation, is 30 to 35%. The left ventricle has moderately decreased function. Left ventricular endocardial border not optimally defined to evaluate regional wall motion. The left ventricular internal cavity size was normal in size. There is mild left ventricular hypertrophy. Left ventricular diastolic parameters are consistent with Grade I diastolic dysfunction (impaired relaxation).  Right Ventricle: The right ventricular size is normal. No increase in right ventricular wall thickness. Right ventricular systolic function is normal.  Pericardium: There is no evidence of pericardial  effusion.  Mitral Valve: The mitral valve is grossly normal. Trivial mitral valve regurgitation. No evidence of mitral valve stenosis.  Aortic Valve: The aortic valve was not well visualized.  Additional Comments: Spectral Doppler performed. Color Doppler performed.  LEFT VENTRICLE PLAX 2D LVIDd:         4.50 cm Diastology LVIDs:         4.30 cm LV e' medial:    4.57 cm/s LV PW:         1.10 cm LV E/e' medial:  12.5 LV IVS:        1.10 cm LV e' lateral:   6.64 cm/s LV E/e' lateral: 8.6   RIGHT VENTRICLE RV S prime:     15.30 cm/s TAPSE (M-mode): 1.6 cm  LEFT ATRIUM         Index LA diam:    3.90 cm 1.87 cm/m  AORTA Ao Root diam: 3.80 cm  MITRAL VALVE MV Area (PHT): 4.06 cm MV Decel Time: 187 msec MV E velocity: 57.00 cm/s MV A velocity: 81.00 cm/s MV E/A ratio:   0.70  Epifanio Lesches MD Electronically signed by Epifanio Lesches MD Signature Date/Time: 12/05/2022/4:11:30 PM    Final             Risk Assessment/Calculations:             Physical Exam:   VS:  BP 108/67 (BP Location: Left Arm, Patient Position: Sitting, Cuff Size: Normal)   Pulse 76   Ht 5\' 11"  (1.803 m)   Wt 196 lb (88.9 kg)   SpO2 95%   BMI 27.34 kg/m    Wt Readings from Last 3 Encounters:  01/11/23 196 lb (88.9 kg)  12/24/22 193 lb (87.5 kg)  12/06/22 191 lb 12.8 oz (87 kg)    GEN: Well nourished, well developed in no acute distress NECK: No JVD; No carotid bruits CARDIAC: RRR, no murmurs, rubs, gallops RESPIRATORY:  Clear to auscultation without rales, wheezing or rhonchi  ABDOMEN: Soft, non-tender, non-distended EXTREMITIES:  No edema; No deformity   ASSESSMENT AND PLAN: .    LV dysfunction  Decreased ejection fraction (EF) from 50-55% in 2018 to 25-30% in June 2024, with a slight improvement to 30-35% in September 2024. Despite initiation of Entresto, EF remains below normal. Differential diagnosis includes ischemic cardiomyopathy vs non-ischemic cardiomyopathy. -Schedule cardiac catheterization in the next two weeks to differentiate between ischemic and non-ischemic cardiomyopathy. -Order Basal Metabolic Panel and CBC today. -Continue Carvedilol and Jardiance. -Hold Furosemide for two days prior to the catheterization. -Hold Entresto on the morning of the procedure.  CAD: Patient denies any recent chest pain.  The last cardiac catheterization was in July 2017 that showed patent stents, occluded PLA branch of RCA with distal collaterals.  Not amenable to PCI.  Medical therapy was recommended.  Hypertension: Blood pressure well-controlled  Hyperlipidemia: On Repatha  DM2: Managed by primary care provider    Informed Consent   Shared Decision Making/Informed Consent The risks [stroke (1 in 1000), death (1 in 1000), kidney failure [usually  temporary] (1 in 500), bleeding (1 in 200), allergic reaction [possibly serious] (1 in 200)], benefits (diagnostic support and management of coronary artery disease) and alternatives of a cardiac catheterization were discussed in detail with Mr. Treese and he is willing to proceed.     Dispo: Follow-up with Dr. Jacinto Halim as previously scheduled  Signed, Azalee Course, [place]

## 2023-01-11 NOTE — Patient Instructions (Addendum)
Medication Instructions:  NO CHANGES *If you need a refill on your cardiac medications before your next appointment, please call your pharmacy*   Lab Work: BMP AND CBC TODAY. If you have labs (blood work) drawn today and your tests are completely normal, you will receive your results only by: MyChart Message (if you have MyChart) OR A paper copy in the mail If you have any lab test that is abnormal or we need to change your treatment, we will call you to review the results.   Testing/Procedures: Your physician has requested that you have a cardiac catheterization. Cardiac catheterization is used to diagnose and/or treat various heart conditions. Doctors may recommend this procedure for a number of different reasons. The most common reason is to evaluate chest pain. Chest pain can be a symptom of coronary artery disease (CAD), and cardiac catheterization can show whether plaque is narrowing or blocking your heart's arteries. This procedure is also used to evaluate the valves, as well as measure the blood flow and oxygen levels in different parts of your heart. For further information please visit [email or web address] Please follow instructions as given below.    Follow-Up: At [facility], you and your health needs are our priority.  As part of our continuing mission to provide you with exceptional heart care, we have created designated Provider Care Teams.  These Care Teams include your primary Cardiologist (physician) and Advanced Practice Providers (APPs -  Physician Assistants and Nurse Practitioners) who all work together to provide you with the care you need, when you need it.   Your next appointment:   KEEP UPCOMING APPOINTMENT   Provider:   Yates Decamp, MD  Other Instructions       Cardiac/Peripheral Catheterization   You are scheduled for a Cardiac Catheterization on Tuesday, November 13 with Dr. Verne Carrow.  1. Please arrive at the [facility] (Main  Entrance A) at [facility]: [address] [place], [place] [address] at 8:00 AM (This time is 2 hour(s) before your procedure to ensure your preparation). Free valet parking service is available. You will check in at ADMITTING. The support person will be asked to wait in the waiting room.  It is OK to have someone drop you off and come back when you are ready to be discharged.        Special note: Every effort is made to have your procedure done on time. Please understand that emergencies sometimes delay scheduled procedures.  2. Diet: Do not eat solid foods after midnight.  You may have clear liquids until 5 AM the day of the procedure.  3. Labs: You will need to have blood drawn today 01/11/23. You do not need to be fasting.  4. Medication instructions in preparation for your procedure:   Contrast Allergy: No  Stop taking, LASIX AND JARDIANCE  Monday, November 12, HOLD ENTRESTO THE MORNING OF PROCEDURE.  Take only 1/2 DOSE units of insulin the night before your procedure. Do not take any insulin on the day of the procedure.  On the morning of your procedure, take Aspirin 81 mg and any morning medicines NOT listed above.  You may use sips of water.  5. Plan to go home the same day, you will only stay overnight if medically necessary. 6. You MUST have a responsible adult to drive you home. 7. An adult MUST be with you the first 24 hours after you arrive home. 8. Bring a current list of your medications, and the last  time and date medication taken. 9. Bring ID and current insurance cards. 10.Please wear clothes that are easy to get on and off and wear slip-on shoes.  Thank you for allowing [place] to care for you!   -- [place] [facility]

## 2023-01-12 LAB — BASIC METABOLIC PANEL
BUN/Creatinine Ratio: 28 — ABNORMAL HIGH (ref 10–24)
BUN: 22 mg/dL (ref 8–27)
CO2: 24 mmol/L (ref 20–29)
Calcium: 9.5 mg/dL (ref 8.6–10.2)
Chloride: 101 mmol/L (ref 96–106)
Creatinine, Ser: 0.8 mg/dL (ref 0.76–1.27)
Glucose: 116 mg/dL — ABNORMAL HIGH (ref 70–99)
Potassium: 4.6 mmol/L (ref 3.5–5.2)
Sodium: 143 mmol/L (ref 134–144)
eGFR: 93 mL/min/{1.73_m2} (ref >59)

## 2023-01-12 LAB — CBC
Hematocrit: 54.8 % — ABNORMAL HIGH (ref 37.5–51.0)
Hemoglobin: 17.9 g/dL — ABNORMAL HIGH (ref 13.0–17.7)
MCH: 29.4 pg (ref 26.6–33.0)
MCHC: 32.7 g/dL (ref 31.5–35.7)
MCV: 90 fL (ref 79–97)
Platelets: 197 10*3/uL (ref 150–450)
RBC: 6.08 x10E6/uL — ABNORMAL HIGH (ref 4.14–5.80)
RDW: 13.6 % (ref 11.6–15.4)
WBC: 8.4 10*3/uL (ref 3.4–10.8)

## 2023-01-18 ENCOUNTER — Telehealth: Payer: Self-pay | Admitting: *Deleted

## 2023-01-18 NOTE — Telephone Encounter (Signed)
Cardiac Catheterization scheduled at [facility] for: Wednesday January 19, 2023 10 AM  Arrival time [facility] Main Entrance A at: 8 AM  Nothing to eat after midnight prior to procedure, clear liquids until 5 AM day of procedure.  Medication instructions: -Hold:  Lasix/Jardiance-AM of procedure  Insulin-AM of procedure/1/2 usual dose HS prior to procedure -Other usual morning medications can be taken with sips of water including aspirin 81 mg.  Patient reports Mounjaro weekly on Sundays-last dose 01/16/23  Plan to go home the same day, you will only stay overnight if medically necessary.  You must have responsible adult to drive you home.  Someone must be with you the first 24 hours after you arrive home.  Reviewed procedure instructions with patient's wife (DPR), Bonita Quin.

## 2023-01-19 ENCOUNTER — Encounter (HOSPITAL_COMMUNITY)
Admission: RE | Disposition: A | Discharge status: Home / Self Care | Payer: Self-pay | Source: Home / Self Care | Attending: Cardiovascular Disease

## 2023-01-19 ENCOUNTER — Ambulatory Visit (HOSPITAL_COMMUNITY)
Admission: RE | Admit: 2023-01-19 | Discharge: 2023-01-19 | Disposition: A | Discharge status: Home / Self Care | Payer: Medicare HMO | Attending: Cardiovascular Disease | Admitting: Cardiovascular Disease

## 2023-01-19 ENCOUNTER — Encounter (HOSPITAL_COMMUNITY): Payer: Self-pay | Admitting: Cardiovascular Disease

## 2023-01-19 ENCOUNTER — Other Ambulatory Visit: Payer: Self-pay

## 2023-01-19 DIAGNOSIS — I110 Hypertensive heart disease with heart failure: Secondary | ICD-10-CM | POA: Insufficient documentation

## 2023-01-19 DIAGNOSIS — Z955 Presence of coronary angioplasty implant and graft: Secondary | ICD-10-CM | POA: Insufficient documentation

## 2023-01-19 DIAGNOSIS — Z79899 Other long term (current) drug therapy: Secondary | ICD-10-CM | POA: Insufficient documentation

## 2023-01-19 DIAGNOSIS — I428 Other cardiomyopathies: Secondary | ICD-10-CM | POA: Diagnosis not present

## 2023-01-19 DIAGNOSIS — Z8616 Personal history of COVID-19: Secondary | ICD-10-CM | POA: Diagnosis not present

## 2023-01-19 DIAGNOSIS — E11319 Type 2 diabetes mellitus with unspecified diabetic retinopathy without macular edema: Secondary | ICD-10-CM | POA: Insufficient documentation

## 2023-01-19 DIAGNOSIS — I5023 Acute on chronic systolic (congestive) heart failure: Secondary | ICD-10-CM | POA: Insufficient documentation

## 2023-01-19 DIAGNOSIS — I2510 Atherosclerotic heart disease of native coronary artery without angina pectoris: Secondary | ICD-10-CM | POA: Insufficient documentation

## 2023-01-19 DIAGNOSIS — E785 Hyperlipidemia, unspecified: Secondary | ICD-10-CM | POA: Diagnosis not present

## 2023-01-19 HISTORY — PX: LEFT HEART CATH AND CORONARY ANGIOGRAPHY: CATH118249

## 2023-01-19 LAB — GLUCOSE, CAPILLARY: Glucose-Capillary: 176 mg/dL — ABNORMAL HIGH (ref 70–99)

## 2023-01-19 SURGERY — LEFT HEART CATH AND CORONARY ANGIOGRAPHY
Anesthesia: LOCAL

## 2023-01-19 MED ORDER — LIDOCAINE HCL (PF) 1 % IJ SOLN
INTRAMUSCULAR | Status: AC
  Filled 2023-01-19: qty 30

## 2023-01-19 MED ORDER — SODIUM CHLORIDE 0.9% FLUSH
3.0000 mL | Freq: Two times a day (BID) | INTRAVENOUS | Status: DC
Start: 2023-01-19 — End: 2023-01-19

## 2023-01-19 MED ORDER — SODIUM CHLORIDE 0.9% FLUSH: 3.0000 mL | INTRAVENOUS | Status: DC | PRN

## 2023-01-19 MED ORDER — SODIUM CHLORIDE 0.9 % IV SOLN: INTRAVENOUS | Status: DC

## 2023-01-19 MED ORDER — IOHEXOL 350 MG/ML SOLN
INTRAVENOUS | Status: DC | PRN
  Administered 2023-01-19: 40 mL

## 2023-01-19 MED ORDER — ONDANSETRON HCL 4 MG/2ML IJ SOLN: 4.0000 mg | Freq: Four times a day (QID) | INTRAMUSCULAR | Status: DC | PRN

## 2023-01-19 MED ORDER — HEPARIN (PORCINE) IN NACL 1000-0.9 UT/500ML-% IV SOLN
INTRAVENOUS | Status: DC | PRN
  Administered 2023-01-19 (×2): 500 mL

## 2023-01-19 MED ORDER — SODIUM CHLORIDE 0.9 % IV SOLN
INTRAVENOUS | Status: AC
Start: 2023-01-19 — End: 2023-01-19

## 2023-01-19 MED ORDER — ASPIRIN 81 MG PO CHEW: 81.0000 mg | CHEWABLE_TABLET | ORAL | Status: DC

## 2023-01-19 MED ORDER — MIDAZOLAM HCL 2 MG/2ML IJ SOLN
INTRAMUSCULAR | Status: DC | PRN
  Administered 2023-01-19: 2 mg via INTRAVENOUS

## 2023-01-19 MED ORDER — VERAPAMIL HCL 2.5 MG/ML IV SOLN
INTRAVENOUS | Status: AC
  Filled 2023-01-19: qty 2

## 2023-01-19 MED ORDER — VERAPAMIL HCL 2.5 MG/ML IV SOLN
INTRAVENOUS | Status: DC | PRN
  Administered 2023-01-19: 10 mL via INTRA_ARTERIAL

## 2023-01-19 MED ORDER — SODIUM CHLORIDE 0.9 % IV SOLN
250.0000 mL | INTRAVENOUS | Status: DC | PRN
Start: 2023-01-19 — End: 2023-01-19

## 2023-01-19 MED ORDER — HYDRALAZINE HCL 20 MG/ML IJ SOLN: 10.0000 mg | INTRAMUSCULAR | Status: DC | PRN

## 2023-01-19 MED ORDER — HEPARIN SODIUM (PORCINE) 1000 UNIT/ML IJ SOLN
INTRAMUSCULAR | Status: DC | PRN
  Administered 2023-01-19: 4000 [IU] via INTRAVENOUS

## 2023-01-19 MED ORDER — MIDAZOLAM HCL 2 MG/2ML IJ SOLN
INTRAMUSCULAR | Status: AC
  Filled 2023-01-19: qty 2

## 2023-01-19 MED ORDER — FENTANYL CITRATE (PF) 100 MCG/2ML IJ SOLN
INTRAMUSCULAR | Status: AC
  Filled 2023-01-19: qty 2

## 2023-01-19 MED ORDER — FENTANYL CITRATE (PF) 100 MCG/2ML IJ SOLN
INTRAMUSCULAR | Status: DC | PRN
  Administered 2023-01-19: 50 ug via INTRAVENOUS

## 2023-01-19 MED ORDER — ACETAMINOPHEN 325 MG PO TABS: 650.0000 mg | ORAL_TABLET | ORAL | Status: DC | PRN

## 2023-01-19 MED ORDER — LABETALOL HCL 5 MG/ML IV SOLN: 10.0000 mg | INTRAVENOUS | Status: DC | PRN

## 2023-01-19 MED ORDER — HEPARIN SODIUM (PORCINE) 1000 UNIT/ML IJ SOLN
INTRAMUSCULAR | Status: AC
Start: 2023-01-19 — End: ?
  Filled 2023-01-19: qty 10

## 2023-01-19 MED ORDER — LIDOCAINE HCL (PF) 1 % IJ SOLN
INTRAMUSCULAR | Status: DC | PRN
  Administered 2023-01-19: 5 mL

## 2023-01-19 SURGICAL SUPPLY — 8 items
CATH 5FR JL3.5 JR4 ANG PIG MP (CATHETERS) IMPLANT
DEVICE RAD COMP TR BAND LRG (VASCULAR PRODUCTS) IMPLANT
GLIDESHEATH SLEND SS 6F .021 (SHEATH) IMPLANT
GUIDEWIRE INQWIRE 1.5J.035X260 (WIRE) IMPLANT
INQWIRE 1.5J .035X260CM (WIRE) ×1
PACK CARDIAC CATHETERIZATION (CUSTOM PROCEDURE TRAY) ×2 IMPLANT
SET ATX-X65L (MISCELLANEOUS) IMPLANT
TUBING CIL FLEX 10 FLL-RA (TUBING) IMPLANT

## 2023-01-19 NOTE — Progress Notes (Signed)
TR band removed at 1200, gauze dressing applied. Right radial level 0, clean, dry, and intact. Patient walked to the bathroom without difficulties.

## 2023-01-19 NOTE — Discharge Instructions (Signed)

## 2023-01-19 NOTE — Interval H&P Note (Signed)
History and Physical Interval Note:  01/19/2023 9:17 AM  Alan Wang  has presented today for surgery, with the diagnosis of lv disfunction.  The various methods of treatment have been discussed with the patient and family. After consideration of risks, benefits and other options for treatment, the patient has consented to  Procedure(s): LEFT HEART CATH AND CORONARY ANGIOGRAPHY (N/A) as a surgical intervention.  The patient's history has been reviewed, patient examined, no change in status, stable for surgery.  I have reviewed the patient's chart and labs.  Questions were answered to the patient's satisfaction.    Cath Lab Visit (complete for each Cath Lab visit)  Clinical Evaluation Leading to the Procedure:   ACS: No.  Non-ACS:    Anginal Classification: No Symptoms  Anti-ischemic medical therapy: Minimal Therapy (1 class of medications)  Non-Invasive Test Results: No non-invasive testing performed  Prior CABG: No previous CABG        Verne Carrow

## 2023-02-02 ENCOUNTER — Encounter: Payer: Self-pay | Admitting: Cardiology

## 2023-02-02 ENCOUNTER — Ambulatory Visit: Payer: Medicare HMO | Attending: Cardiology | Admitting: Cardiology

## 2023-02-02 VITALS — BP 118/82 | HR 84 | Ht 70.0 in | Wt 194.8 lb

## 2023-02-02 DIAGNOSIS — I2510 Atherosclerotic heart disease of native coronary artery without angina pectoris: Secondary | ICD-10-CM | POA: Diagnosis not present

## 2023-02-02 DIAGNOSIS — I5022 Chronic systolic (congestive) heart failure: Secondary | ICD-10-CM | POA: Diagnosis not present

## 2023-02-02 DIAGNOSIS — I428 Other cardiomyopathies: Secondary | ICD-10-CM | POA: Diagnosis not present

## 2023-02-02 MED ORDER — JARDIANCE 10 MG PO TABS: 10.0000 mg | ORAL_TABLET | Freq: Every day | ORAL | 3 refills | Status: AC

## 2023-02-02 MED ORDER — CARVEDILOL 6.25 MG PO TABS: 6.2500 mg | ORAL_TABLET | Freq: Two times a day (BID) | ORAL | 3 refills | Status: DC

## 2023-02-02 MED ORDER — ENTRESTO 24-26 MG PO TABS: 1.0000 | ORAL_TABLET | Freq: Two times a day (BID) | ORAL | 3 refills | Status: AC

## 2023-02-02 NOTE — Progress Notes (Signed)
Cardiology Office Note:  .   Date:  02/02/2023  ID:  Alan Wang, DOB 12/02/1950, MRN [id number] PCP: Henrine Screws, MD  [facility] Providers Cardiologist:  Yates Decamp, MD   History of Present Illness: [name]   Alan Wang is a 72 y.o. Caucasian male with history of known coronary artery disease, angioplasty to his LAD, circumflex and also ramus intermediate vessels.  He has history of diabetes mellitus with diabetic retinopathy, hypertension and hyperlipidemia.  Since admission to the hospital in June 2024 with cold encephalopathy, he has developed new onset cardiomyopathy with severe LV systolic dysfunction, underwent coronary angiography on 01/19/2023 revealing stable coronary disease with patent stent in the LAD and CX.  He now presents for follow-up of chronic systolic heart failure.  Discussed the use of AI scribe software for clinical note transcription with the patient, who gave verbal consent to proceed.  History of Present Illness   The patient, with a history of heart disease, presents for a follow-up visit. He reports stable weight, fluctuating between 189 and 191 pounds, indicating no significant fluid retention. The patient's spouse confirms adherence to prescribed medications, including Lasix and Entresto. The patient's heart condition was reportedly stable until he contracted COVID-19 in June 2024, which led to a significant decline in his health. The patient was found to have a weak heart at that time. Despite this, the patient reports feeling well overall, although he acknowledges he is not as active as he once was. The patient is planning a 20-day trip to [place] in the near future.        Review of Systems  Cardiovascular:  Negative for chest pain, dyspnea on exertion and leg swelling.    Labs   Lab Results  Component Value Date   CHOL 115 06/23/2016   HDL 40 (L) 06/23/2016   LDLCALC 59 06/23/2016   TRIG 80 06/23/2016   CHOLHDL 2.9 06/23/2016   Lab Results   Component Value Date   NA 143 01/11/2023   K 4.6 01/11/2023   CO2 24 01/11/2023   GLUCOSE 116 (H) 01/11/2023   BUN 22 01/11/2023   CREATININE 0.80 01/11/2023   CALCIUM 9.5 01/11/2023   EGFR 93 01/11/2023   GFRNONAA >60 12/06/2022      Latest Ref Rng & Units 01/11/2023    3:01 PM 12/24/2022   10:59 AM 12/06/2022    4:28 AM  BMP  Glucose 70 - 99 mg/dL 782  956  213   BUN 8 - 27 mg/dL 22  20  27    Creatinine 0.76 - 1.27 mg/dL 0.86  5.78  4.69   BUN/Creat Ratio 10 - 24 28  22     Sodium 134 - 144 mmol/L 143  144  137   Potassium 3.5 - 5.2 mmol/L 4.6  4.7  3.8   Chloride 96 - 106 mmol/L 101  104  103   CO2 20 - 29 mmol/L 24  25  22    Calcium 8.6 - 10.2 mg/dL 9.5  9.4  9.1        Latest Ref Rng & Units 01/11/2023    3:01 PM 12/06/2022    4:28 AM 12/05/2022   12:03 AM  CBC  WBC 3.4 - 10.8 x10E3/uL 8.4  7.6  7.5   Hemoglobin 13.0 - 17.7 g/dL 62.9  52.8  41.3   Hematocrit 37.5 - 51.0 % 54.8  55.1  50.0   Platelets 150 - 450 x10E3/uL 197  180  171  Physical Exam:   VS:  BP 118/82   Pulse 84   Ht 5\' 10"  (1.778 m)   Wt 194 lb 12.8 oz (88.4 kg)   SpO2 96%   BMI 27.95 kg/m    Wt Readings from Last 3 Encounters:  02/02/23 194 lb 12.8 oz (88.4 kg)  01/19/23 190 lb (86.2 kg)  01/11/23 196 lb (88.9 kg)     Physical Exam Neck:     Vascular: No carotid bruit or JVD.  Cardiovascular:     Rate and Rhythm: Normal rate and regular rhythm.     Pulses: Intact distal pulses.          Dorsalis pedis pulses are 0 on the right side and 1+ on the left side.       Posterior tibial pulses are 0 on the right side and 1+ on the left side.     Heart sounds: Normal heart sounds. No murmur heard.    No gallop.  Pulmonary:     Effort: Pulmonary effort is normal.     Breath sounds: Normal breath sounds.  Abdominal:     General: Bowel sounds are normal.     Palpations: Abdomen is soft.  Musculoskeletal:     Right lower leg: No edema.     Left lower leg: No edema.     Studies  Reviewed: [name]    ECHOCARDIOGRAM LIMITED 12/05/2022  1. Left ventricular ejection fraction, by estimation, is 30 to 35%. The left ventricle has moderately decreased function. Left ventricular endocardial border not optimally defined to evaluate regional wall motion. There is mild left ventricular hypertrophy. Left ventricular diastolic parameters are consistent with Grade I diastolic dysfunction (impaired relaxation). 2. Right ventricle is poorly visualized but appears grossly normal size and systolic function 3. Technically difficult study. Consider repeating limited study with contrast for better evaluation of LV systolic function  Coronary angiogram 01/19/2023: Patent mid LAD stent Patent mid Circumflex stent.  Large intermediate branch with mild ostial stenosis Large dominant RCA with diffuse mild calcified plaque in the proximal, mid and distal segments. Chronic occlusion of the right posterolateral artery. This branch fills from left to right collaterals supplied by the Circumflex.  LVEDP=12 mmHg    EKG:        EKG 09/06/2022: Normal sinus rhythm with rate of 81 bpm, borderline LAE, left axis deviation, left anterior fascicular block. Low-voltage. Single PVC. Compared to 02/09/2021, no significant change   Medications and allergies    Allergies  Allergen Reactions   Avandia [Rosiglitazone Maleate] Swelling and Other (See Comments)    Lower extremities became swollen     Current Outpatient Medications:    acetaminophen (TYLENOL) 650 MG CR tablet, Take 1,300 mg by mouth 2 (two) times daily., Disp: , Rfl:    Apoaequorin (PREVAGEN PO), Take 1 capsule by mouth daily. Extra Strength, Disp: , Rfl:    Ascorbic Acid (VITAMIN C) 500 MG CAPS, Take 500 mg by mouth daily., Disp: , Rfl:    aspirin (ASPIRIN CHILDRENS) 81 MG chewable tablet, Chew 1 tablet (81 mg total) by mouth daily., Disp: , Rfl:    Cholecalciferol (VITAMIN D) 2000 units CAPS, Take 2,000 Units by mouth daily., Disp: , Rfl:     CINNAMON PO, Take 500 mg by mouth 2 (two) times daily., Disp: , Rfl:    diclofenac Sodium (VOLTAREN) 1 % GEL, Apply 2 g topically 4 (four) times daily. Apply to right shoulder (Patient taking differently: Apply 2 g topically daily as needed (Apply  to right shoulder pain).), Disp: 50 g, Rfl: 2   DULoxetine (CYMBALTA) 30 MG capsule, Take 30 mg by mouth at bedtime., Disp: , Rfl:    furosemide (LASIX) 40 MG tablet, Take 1 tablet (40 mg total) by mouth daily., Disp: 30 tablet, Rfl: 2   insulin NPH (HUMULIN N,NOVOLIN N) 100 UNIT/ML injection, Inject 5-25 Units into the skin See admin instructions. Inject 5 units into the skin before breakfast and 25 units before supper, Disp: , Rfl:    insulin regular (NOVOLIN R,HUMULIN R) 100 units/mL injection, Inject 10-15 Units into the skin See admin instructions. Inject 10 units into the skin before breakfast and 15 units before supper, Disp: , Rfl:    Magnesium 500 MG TABS, Take 500 mg by mouth daily., Disp: , Rfl:    MOUNJARO 5 MG/0.5ML Pen, Inject 5 mg into the skin once a week., Disp: , Rfl:    naproxen (NAPROSYN) 500 MG tablet, Take 500 mg by mouth 2 (two) times daily., Disp: , Rfl:    polyethylene glycol (MIRALAX / GLYCOLAX) 17 g packet, Take 17 g by mouth daily., Disp: , Rfl:    REPATHA SURECLICK 140 MG/ML SOAJ, Inject 140 mg as directed every 14 (fourteen) days., Disp: , Rfl:    traMADol (ULTRAM) 50 MG tablet, Take 1 tablet (50 mg total) by mouth every 6 (six) hours as needed for moderate pain. (Patient taking differently: Take 100 mg by mouth every 6 (six) hours as needed for moderate pain (pain score 4-6).), Disp: 20 tablet, Rfl: 0   TURMERIC PO, Take 1,500 mg by mouth daily., Disp: , Rfl:    carvedilol (COREG) 6.25 MG tablet, Take 1 tablet (6.25 mg total) by mouth 2 (two) times daily with a meal., Disp: 180 tablet, Rfl: 3   JARDIANCE 10 MG TABS tablet, Take 1 tablet (10 mg total) by mouth daily., Disp: 90 tablet, Rfl: 3   nitroGLYCERIN (NITROSTAT) 0.4 MG SL  tablet, Place 1 tablet (0.4 mg total) under the tongue every 5 (five) minutes as needed for chest pain., Disp: 30 tablet, Rfl: 0   sacubitril-valsartan (ENTRESTO) 24-26 MG, Take 1 tablet by mouth 2 (two) times daily., Disp: 180 tablet, Rfl: 3   ASSESSMENT AND PLAN: .      ICD-10-CM   1. Chronic systolic heart failure (HCC)  B28.41 JARDIANCE 10 MG TABS tablet    carvedilol (COREG) 6.25 MG tablet    sacubitril-valsartan (ENTRESTO) 24-26 MG    2. Non-ischemic cardiomyopathy (HCC)  I42.8     3. Coronary artery disease involving native coronary artery of native heart without angina pectoris  I25.10      1. Chronic systolic heart failure (HCC) Patient is presently doing well, he is on maximum tolerated dose of carvedilol and also Entresto, unable to tolerate any higher dose due to low blood pressure and dizziness.  Continue Jardiance, carvedilol and Entresto.  - JARDIANCE 10 MG TABS tablet; Take 1 tablet (10 mg total) by mouth daily.  Dispense: 90 tablet; Refill: 3 - carvedilol (COREG) 6.25 MG tablet; Take 1 tablet (6.25 mg total) by mouth 2 (two) times daily with a meal.  Dispense: 180 tablet; Refill: 3 - sacubitril-valsartan (ENTRESTO) 24-26 MG; Take 1 tablet by mouth 2 (two) times daily.  Dispense: 180 tablet; Refill: 3  2. Non-ischemic cardiomyopathy (HCC) Patient's coronary angiography was reviewed again with the patient and his wife.  No significant change in coronary anatomy.  Findings consistent with nonischemic cardiomyopathy and I suspect could be related  to COVID 19 infection. Will repeat echocardiogram in 6 months prior to his next office visit. - ECHOCARDIOGRAM COMPLETE; Future  3. Coronary artery disease involving native coronary artery of native heart without angina pectoris Patient is presently doing well and remains asymptomatic without recurrence of angina pectoris.  No clinical evidence of heart failure.  He is on appropriate medical therapy.  Reviewed his labs. -  ECHOCARDIOGRAM COMPLETE; Future   Signed,  Yates Decamp, MD, [facility] 02/02/2023, 9:10 AM [facility] [address] [place], [place] [address] Phone: [phone]. Fax:  [phone]

## 2023-02-02 NOTE — Patient Instructions (Addendum)
Medication Instructions:  Your physician recommends that you continue on your current medications as directed. Please refer to the Current Medication list given to you today.  *If you need a refill on your cardiac medications before your next appointment, please call your pharmacy*   Lab Work: none If you have labs (blood work) drawn today and your tests are completely normal, you will receive your results only by: MyChart Message (if you have MyChart) OR A paper copy in the mail If you have any lab test that is abnormal or we need to change your treatment, we will call you to review the results.   Testing/Procedures: Your physician has requested that you have an echocardiogram. To be done in 6 months prior to visit Echocardiography is a painless test that uses sound waves to create images of your heart. It provides your doctor with information about the size and shape of your heart and how well your heart's chambers and valves are working. This procedure takes approximately one hour. There are no restrictions for this procedure. Please do NOT wear cologne, perfume, aftershave, or lotions (deodorant is allowed). Please arrive 15 minutes prior to your appointment time.  Please note: We ask at that you not bring children with you during ultrasound (echo/ vascular) testing. Due to room size and safety concerns, children are not allowed in the ultrasound rooms during exams. Our front office staff cannot provide observation of children in our lobby area while testing is being conducted. An adult accompanying a patient to their appointment will only be allowed in the ultrasound room at the discretion of the ultrasound technician under special circumstances. We apologize for any inconvenience.    Follow-Up: At [facility], you and your health needs are our priority.  As part of our continuing mission to provide you with exceptional heart care, we have created designated Provider Care  Teams.  These Care Teams include your primary Cardiologist (physician) and Advanced Practice Providers (APPs -  Physician Assistants and Nurse Practitioners) who all work together to provide you with the care you need, when you need it.  We recommend signing up for the patient portal called "MyChart".  Sign up information is provided on this After Visit Summary.  MyChart is used to connect with patients for Virtual Visits (Telemedicine).  Patients are able to view lab/test results, encounter notes, upcoming appointments, etc.  Non-urgent messages can be sent to your provider as well.   To learn more about what you can do with MyChart, go to [email or web address].    Your next appointment:   6 month(s)  Provider:   Yates Decamp, MD     Other Instructions

## 2023-02-16 DIAGNOSIS — H6992 Unspecified Eustachian tube disorder, left ear: Secondary | ICD-10-CM | POA: Diagnosis not present

## 2023-02-16 DIAGNOSIS — H6122 Impacted cerumen, left ear: Secondary | ICD-10-CM | POA: Diagnosis not present

## 2023-02-21 DIAGNOSIS — H6123 Impacted cerumen, bilateral: Secondary | ICD-10-CM | POA: Diagnosis not present

## 2023-03-08 DIAGNOSIS — E113293 Type 2 diabetes mellitus with mild nonproliferative diabetic retinopathy without macular edema, bilateral: Secondary | ICD-10-CM | POA: Diagnosis not present

## 2023-03-08 DIAGNOSIS — E119 Type 2 diabetes mellitus without complications: Secondary | ICD-10-CM | POA: Diagnosis not present

## 2023-03-08 DIAGNOSIS — M150 Primary generalized (osteo)arthritis: Secondary | ICD-10-CM | POA: Diagnosis not present

## 2023-03-08 DIAGNOSIS — I2510 Atherosclerotic heart disease of native coronary artery without angina pectoris: Secondary | ICD-10-CM | POA: Diagnosis not present

## 2023-03-08 DIAGNOSIS — D126 Benign neoplasm of colon, unspecified: Secondary | ICD-10-CM | POA: Diagnosis not present

## 2023-03-08 DIAGNOSIS — I10 Essential (primary) hypertension: Secondary | ICD-10-CM | POA: Diagnosis not present

## 2023-03-08 DIAGNOSIS — E785 Hyperlipidemia, unspecified: Secondary | ICD-10-CM | POA: Diagnosis not present

## 2023-04-11 ENCOUNTER — Other Ambulatory Visit (HOSPITAL_COMMUNITY): Payer: Medicare HMO

## 2023-05-10 DIAGNOSIS — E1165 Type 2 diabetes mellitus with hyperglycemia: Secondary | ICD-10-CM | POA: Diagnosis not present

## 2023-05-26 DIAGNOSIS — I10 Essential (primary) hypertension: Secondary | ICD-10-CM | POA: Diagnosis not present

## 2023-05-26 DIAGNOSIS — E11319 Type 2 diabetes mellitus with unspecified diabetic retinopathy without macular edema: Secondary | ICD-10-CM | POA: Diagnosis not present

## 2023-05-26 DIAGNOSIS — E1165 Type 2 diabetes mellitus with hyperglycemia: Secondary | ICD-10-CM | POA: Diagnosis not present

## 2023-05-26 DIAGNOSIS — G609 Hereditary and idiopathic neuropathy, unspecified: Secondary | ICD-10-CM | POA: Diagnosis not present

## 2023-05-26 DIAGNOSIS — E7800 Pure hypercholesterolemia, unspecified: Secondary | ICD-10-CM | POA: Diagnosis not present

## 2023-06-03 DIAGNOSIS — I10 Essential (primary) hypertension: Secondary | ICD-10-CM | POA: Diagnosis not present

## 2023-06-03 DIAGNOSIS — I428 Other cardiomyopathies: Secondary | ICD-10-CM | POA: Diagnosis not present

## 2023-06-03 DIAGNOSIS — Z8673 Personal history of transient ischemic attack (TIA), and cerebral infarction without residual deficits: Secondary | ICD-10-CM | POA: Diagnosis not present

## 2023-06-03 DIAGNOSIS — E113293 Type 2 diabetes mellitus with mild nonproliferative diabetic retinopathy without macular edema, bilateral: Secondary | ICD-10-CM | POA: Diagnosis not present

## 2023-06-03 DIAGNOSIS — D126 Benign neoplasm of colon, unspecified: Secondary | ICD-10-CM | POA: Diagnosis not present

## 2023-06-03 DIAGNOSIS — Z0000 Encounter for general adult medical examination without abnormal findings: Secondary | ICD-10-CM | POA: Diagnosis not present

## 2023-06-03 DIAGNOSIS — M150 Primary generalized (osteo)arthritis: Secondary | ICD-10-CM | POA: Diagnosis not present

## 2023-06-03 DIAGNOSIS — G729 Myopathy, unspecified: Secondary | ICD-10-CM | POA: Diagnosis not present

## 2023-06-03 DIAGNOSIS — E119 Type 2 diabetes mellitus without complications: Secondary | ICD-10-CM | POA: Diagnosis not present

## 2023-06-03 DIAGNOSIS — I5022 Chronic systolic (congestive) heart failure: Secondary | ICD-10-CM | POA: Diagnosis not present

## 2023-06-03 DIAGNOSIS — E785 Hyperlipidemia, unspecified: Secondary | ICD-10-CM | POA: Diagnosis not present

## 2023-06-03 DIAGNOSIS — I2510 Atherosclerotic heart disease of native coronary artery without angina pectoris: Secondary | ICD-10-CM | POA: Diagnosis not present

## 2023-07-08 ENCOUNTER — Ambulatory Visit (HOSPITAL_COMMUNITY): Payer: Medicare HMO | Attending: Cardiology

## 2023-07-12 ENCOUNTER — Encounter (HOSPITAL_COMMUNITY): Payer: Self-pay | Admitting: Cardiology

## 2023-07-18 ENCOUNTER — Encounter: Payer: Self-pay | Admitting: Cardiology

## 2023-08-12 DIAGNOSIS — M150 Primary generalized (osteo)arthritis: Secondary | ICD-10-CM | POA: Diagnosis not present

## 2023-08-12 DIAGNOSIS — I10 Essential (primary) hypertension: Secondary | ICD-10-CM | POA: Diagnosis not present

## 2023-08-12 DIAGNOSIS — E785 Hyperlipidemia, unspecified: Secondary | ICD-10-CM | POA: Diagnosis not present

## 2023-08-12 DIAGNOSIS — E119 Type 2 diabetes mellitus without complications: Secondary | ICD-10-CM | POA: Diagnosis not present

## 2023-08-12 LAB — LAB REPORT - SCANNED
A1c: 6.5
EGFR: 96

## 2023-09-14 DIAGNOSIS — D751 Secondary polycythemia: Secondary | ICD-10-CM | POA: Diagnosis not present

## 2023-09-19 ENCOUNTER — Encounter: Payer: Self-pay | Admitting: Cardiology

## 2023-09-26 ENCOUNTER — Other Ambulatory Visit: Payer: Self-pay | Admitting: Hematology and Oncology

## 2023-09-26 ENCOUNTER — Inpatient Hospital Stay

## 2023-09-26 ENCOUNTER — Inpatient Hospital Stay: Attending: Hematology and Oncology | Admitting: Hematology and Oncology

## 2023-09-26 ENCOUNTER — Other Ambulatory Visit: Payer: Self-pay

## 2023-09-26 ENCOUNTER — Encounter: Payer: Self-pay | Admitting: Hematology and Oncology

## 2023-09-26 VITALS — BP 145/83 | HR 81 | Temp 98.1°F | Resp 18 | Ht 70.0 in | Wt 186.6 lb

## 2023-09-26 DIAGNOSIS — Z7982 Long term (current) use of aspirin: Secondary | ICD-10-CM | POA: Diagnosis not present

## 2023-09-26 DIAGNOSIS — Z7985 Long-term (current) use of injectable non-insulin antidiabetic drugs: Secondary | ICD-10-CM | POA: Insufficient documentation

## 2023-09-26 DIAGNOSIS — D45 Polycythemia vera: Secondary | ICD-10-CM

## 2023-09-26 DIAGNOSIS — Z794 Long term (current) use of insulin: Secondary | ICD-10-CM | POA: Diagnosis not present

## 2023-09-26 DIAGNOSIS — Z8673 Personal history of transient ischemic attack (TIA), and cerebral infarction without residual deficits: Secondary | ICD-10-CM | POA: Insufficient documentation

## 2023-09-26 DIAGNOSIS — E119 Type 2 diabetes mellitus without complications: Secondary | ICD-10-CM | POA: Diagnosis not present

## 2023-09-26 LAB — CMP (CANCER CENTER ONLY)
ALT: 7 U/L (ref 0–44)
AST: 14 U/L — ABNORMAL LOW (ref 15–41)
Albumin: 4 g/dL (ref 3.5–5.0)
Alkaline Phosphatase: 77 U/L (ref 38–126)
Anion gap: 6 (ref 5–15)
BUN: 17 mg/dL (ref 8–23)
CO2: 32 mmol/L (ref 22–32)
Calcium: 9.6 mg/dL (ref 8.9–10.3)
Chloride: 107 mmol/L (ref 98–111)
Creatinine: 0.88 mg/dL (ref 0.61–1.24)
GFR, Estimated: >60 mL/min (ref >60)
Glucose, Bld: 117 mg/dL — ABNORMAL HIGH (ref 70–99)
Potassium: 4.4 mmol/L (ref 3.5–5.1)
Sodium: 145 mmol/L (ref 135–145)
Total Bilirubin: 0.6 mg/dL (ref 0.0–1.2)
Total Protein: 6.7 g/dL (ref 6.5–8.1)

## 2023-09-26 LAB — CBC WITH DIFFERENTIAL (CANCER CENTER ONLY)
Abs Immature Granulocytes: 0.03 K/uL (ref 0.00–0.07)
Basophils Absolute: 0.1 K/uL (ref 0.0–0.1)
Basophils Relative: 1 %
Eosinophils Absolute: 0.1 K/uL (ref 0.0–0.5)
Eosinophils Relative: 1 %
HCT: 55.1 % — ABNORMAL HIGH (ref 39.0–52.0)
Hemoglobin: 18.6 g/dL — ABNORMAL HIGH (ref 13.0–17.0)
Immature Granulocytes: 0 %
Lymphocytes Relative: 35 %
Lymphs Abs: 2.3 K/uL (ref 0.7–4.0)
MCH: 30.5 pg (ref 26.0–34.0)
MCHC: 33.8 g/dL (ref 30.0–36.0)
MCV: 90.3 fL (ref 80.0–100.0)
Monocytes Absolute: 0.8 K/uL (ref 0.1–1.0)
Monocytes Relative: 12 %
Neutro Abs: 3.4 K/uL (ref 1.7–7.7)
Neutrophils Relative %: 51 %
Platelet Count: 181 K/uL (ref 150–400)
RBC: 6.1 MIL/uL — ABNORMAL HIGH (ref 4.22–5.81)
RDW: 13.8 % (ref 11.5–15.5)
WBC Count: 6.7 K/uL (ref 4.0–10.5)
nRBC: 0 % (ref 0.0–0.2)

## 2023-09-26 LAB — URIC ACID: Uric Acid, Serum: 3.8 mg/dL (ref 3.7–8.6)

## 2023-09-26 NOTE — Assessment & Plan Note (Signed)
 He has lost weight when he was started on Mounjaro and Jardiance  recently However, the patient has visible muscle loss He has very poor oral protein intake We discussed importance of frequent small meals with higher protein intake to avoid muscle loss

## 2023-09-26 NOTE — Progress Notes (Signed)
 [facility] CONSULT NOTE  Patient Care Team: Frederik Charleston, MD as PCP - General (Family Medicine) Ladona Heinz, MD as PCP - Cardiology (Cardiology) Dolphus Reiter, MD as Consulting Physician (Rheumatology)   ASSESSMENT & PLAN Polycythemia vera [facility]) The patient has been donating blood up until several years ago I suspect he might have undiagnosed polycythemia vera Frequent dehydration with poor oral fluid intake probably contributed to very high hemoglobin level He has very high hemoglobin last year He has history of TIA and given his cardiovascular risk profile, he is at very high risk of getting blood clots He will continue taking aspirin  therapy I will order additional workup today and see him back in 2 weeks for results I advised him to start drinking more oral fluid to avoid dehydration  Insulin  dependent type 2 diabetes mellitus (HCC) He has lost weight when he was started on Mounjaro and Jardiance  recently However, the patient has visible muscle loss He has very poor oral protein intake We discussed importance of frequent small meals with higher protein intake to avoid muscle loss  Orders Placed This Encounter  Procedures   CBC with Differential (Cancer Center Only)    Standing Status:   Future    Expiration Date:   09/25/2024   CMP (Cancer Center only)    Standing Status:   Future    Expiration Date:   09/25/2024   Uric acid    Standing Status:   Future    Expiration Date:   09/25/2024   Erythropoietin     Standing Status:   Future    Expiration Date:   09/25/2024   NGS JAK2 V617F/CALR/MPL    Standing Status:   Future    Expiration Date:   09/25/2024   Almarie Bedford, MD 09/26/2023 12:22 PM  The total time spent in the appointment was 60 minutes encounter with patients including review of chart and various tests results, discussions about plan of care and coordination of care plan   All questions were answered. The patient knows to call the clinic with  any problems, questions or concerns. No barriers to learning was detected.  Almarie Bedford, MD 09/26/2023 12:22 PM  CHIEF COMPLAINTS/PURPOSE OF CONSULTATION:  Erythrocytosis  HISTORY OF PRESENTING ILLNESS:  Alan Wang 73 y.o. male is here because of elevated hemoglobin. I saw him in 2017 for inflammatory arthritis/MGUS Further workup did not fulfill criteria of MGUS and he was discharged from the clinic He was found to have abnormal CBC from recent blood work I have the opportunity to review his blood count electronically His last normal CBC was around September 2024 at 15.8 On December 06, 2022, his hemoglobin was 18 On January 11, 2023, hemoglobin was 17.9 On August 12, 2023, white blood cell count was 7.5, hemoglobin 18.5, hematocrit 56.6 and platelet count 18.9 He denies intermittent headaches, shortness of breath on exertion, frequent leg cramps and occasional chest pain.  The patient has diagnosis of TIA in the past but no DVT or PE There is no prior diagnosis of obstructive sleep apnea. The patient has occasional skin itching He does not smoke The patient donates blood regularly through his church but stopped donating approximately 2 years ago He drinks a lot of sweetened beverages on a regular basis, approximately 5 bottles of 12 ounce soda of diet 7-Up or ginger ale.  He also used to drink Pepsi and Coca-Cola but that has stopped.  He does not drink water .  He drinks 1 cup of coffee daily  MEDICAL HISTORY:  Past Medical History:  Diagnosis Date   Arthritis    Asthma    Cancer (HCC)    skin   Coronary artery disease    COVID    Diabetes mellitus    type 2, insulin  dependent for 30+ years   History of kidney stones 11/2012   Hyperlipemia    Hypertension    IDDM (insulin  dependent diabetes mellitus)    Shortness of breath    Stroke (HCC)    hx TIA    SURGICAL HISTORY: Past Surgical History:  Procedure Laterality Date   ANGIOPLASTY  04/06/2011   x3    CARDIAC  CATHETERIZATION     CARDIAC CATHETERIZATION N/A 09/16/2015   Procedure: Left Heart Cath and Coronary Angiography;  Surgeon: Gordy Bergamo, MD;  Location: [facility] INVASIVE CV LAB;  Service: Cardiovascular;  Laterality: N/A;   cataracts     CHOLECYSTECTOMY     HERNIA REPAIR     x2   KNEE ARTHROSCOPY     LEFT HEART CATH AND CORONARY ANGIOGRAPHY N/A 01/19/2023   Procedure: LEFT HEART CATH AND CORONARY ANGIOGRAPHY;  Surgeon: Verlin Lonni BIRCH, MD;  Location: [facility] INVASIVE CV LAB;  Service: Cardiovascular;  Laterality: N/A;   LEFT HEART CATHETERIZATION WITH CORONARY ANGIOGRAM N/A 03/08/2011   Procedure: LEFT HEART CATHETERIZATION WITH CORONARY ANGIOGRAM;  Surgeon: Erick JONELLE Bergamo, MD;  Location: [facility] CATH LAB;  Service: Cardiovascular;  Laterality: N/A;   PERCUTANEOUS CORONARY STENT INTERVENTION (PCI-S) N/A 04/06/2011   Procedure: PERCUTANEOUS CORONARY STENT INTERVENTION (PCI-S);  Surgeon: Erick JONELLE Bergamo, MD;  Location: [facility] C[facility] LAB;  Service: Cardiovascular;  Laterality: N/A;   TOTAL SHOULDER ARTHROPLASTY Right 01/07/2022   Procedure: TOTAL SHOULDER ARTHROPLASTY;  Surgeon: Melita Drivers, MD;  Location: [facility] ORS;  Service: Orthopedics;  Laterality: Right;     SOCIAL HISTORY: Social History   Socioeconomic History   Marital status: Married    Spouse name: Rock   Number of children: 2   Years of education: College   Highest education level: Not on file  Occupational History   Occupation: retired    Comment: Firefighter  Tobacco Use   Smoking status: Never   Smokeless tobacco: Never  Vaping Use   Vaping status: Never Used  Substance and Sexual Activity   Alcohol use: Yes    Comment: occasionally   Drug use: No   Sexual activity: Yes  Other Topics Concern   Not on file  Social History Narrative   Patient lives at home with his spouse.   Caffeine Use: occasionally   Social Drivers of Corporate investment banker Strain: Not on file  Food Insecurity: No Food Insecurity  (12/04/2022)   Hunger Vital Sign    Worried About Running Out of Food in the Last Year: Never true    Ran Out of Food in the Last Year: Never true  Transportation Needs: No Transportation Needs (12/04/2022)   PRAPARE - Administrator, Civil Service (Medical): No    Lack of Transportation (Non-Medical): No  Physical Activity: Not on file  Stress: Not on file  Social Connections: Not on file  Intimate Partner Violence: Not At Risk (12/04/2022)   Humiliation, Afraid, Rape, and Kick questionnaire    Fear of Current or Ex-Partner: No    Emotionally Abused: No    Physically Abused: No    Sexually Abused: No    FAMILY HISTORY: Family History  Problem Relation Age of Onset   Emphysema Mother  COPD Mother    Alzheimer's disease Father    Asthma Paternal Aunt    Heart attack Paternal Grandfather     ALLERGIES:  is allergic to avandia [rosiglitazone maleate].  MEDICATIONS:  Current Outpatient Medications  Medication Sig Dispense Refill   acetaminophen  (TYLENOL ) 650 MG CR tablet Take 1,300 mg by mouth 2 (two) times daily.     Apoaequorin (PREVAGEN PO) Take 1 capsule by mouth daily. Extra Strength     Ascorbic Acid  (VITAMIN C ) 500 MG CAPS Take 500 mg by mouth daily.     aspirin  (ASPIRIN  CHILDRENS) 81 MG chewable tablet Chew 1 tablet (81 mg total) by mouth daily.     carvedilol  (COREG ) 6.25 MG tablet Take 1 tablet (6.25 mg total) by mouth 2 (two) times daily with a meal. 180 tablet 3   Cholecalciferol (VITAMIN D) 2000 units CAPS Take 2,000 Units by mouth daily.     CINNAMON PO Take 500 mg by mouth 2 (two) times daily.     DULoxetine  (CYMBALTA ) 30 MG capsule Take 30 mg by mouth at bedtime.     furosemide  (LASIX ) 40 MG tablet Take 1 tablet (40 mg total) by mouth daily. 30 tablet 2   insulin  NPH (HUMULIN N,NOVOLIN N) 100 UNIT/ML injection Inject 5-25 Units into the skin See admin instructions. Inject 5 units into the skin before breakfast and 25 units before supper     insulin   regular (NOVOLIN R,HUMULIN R ) 100 units/mL injection Inject 10-15 Units into the skin See admin instructions. Inject 10 units into the skin before breakfast and 15 units before supper     JARDIANCE  10 MG TABS tablet Take 1 tablet (10 mg total) by mouth daily. 90 tablet 3   Magnesium 500 MG TABS Take 500 mg by mouth daily.     MOUNJARO 5 MG/0.5ML Pen Inject 5 mg into the skin once a week.     naproxen (NAPROSYN) 500 MG tablet Take 500 mg by mouth 2 (two) times daily.     nitroGLYCERIN  (NITROSTAT ) 0.4 MG SL tablet Place 1 tablet (0.4 mg total) under the tongue every 5 (five) minutes as needed for chest pain. 30 tablet 0   REPATHA SURECLICK 140 MG/ML SOAJ Inject 140 mg as directed every 14 (fourteen) days.     sacubitril -valsartan  (ENTRESTO ) 24-26 MG Take 1 tablet by mouth 2 (two) times daily. 180 tablet 3   traMADol  (ULTRAM ) 50 MG tablet Take 1 tablet (50 mg total) by mouth every 6 (six) hours as needed for moderate pain. (Patient taking differently: Take 100 mg by mouth every 6 (six) hours as needed for moderate pain (pain score 4-6).) 20 tablet 0   TURMERIC PO Take 1,500 mg by mouth daily.     No current facility-administered medications for this visit.    REVIEW OF SYSTEMS: He has some weight loss since he started taking Mounjaro Constitutional: Denies fevers, chills or abnormal night sweats Eyes: Denies blurriness of vision, double vision or watery eyes Ears, nose, mouth, throat, and face: Denies mucositis or sore throat Respiratory: Denies cough, dyspnea or wheezes Cardiovascular: Denies palpitation, chest discomfort or lower extremity swelling Gastrointestinal:  Denies nausea, heartburn or change in bowel habits Skin: Denies abnormal skin rashes Lymphatics: Denies new lymphadenopathy or easy bruising Neurological:Denies numbness, tingling or new weaknesses Behavioral/Psych: Mood is stable, no new changes  All other systems were reviewed with the patient and are negative.  PHYSICAL  EXAMINATION: ECOG PERFORMANCE STATUS: 0 - Asymptomatic  Vitals:   09/26/23 1155  BP: (!) 145/83  Pulse: 81  Resp: 18  Temp: 98.1 F (36.7 C)  SpO2: 100%   Filed Weights   09/26/23 1155  Weight: 186 lb 9.6 oz (84.6 kg)    GENERAL:alert, no distress and comfortable, noted muscle wasting SKIN: skin color, texture, turgor are normal, no rashes or significant lesions EYES: normal, conjunctiva are pink and non-injected, sclera clear OROPHARYNX:no exudate, no erythema and lips, buccal mucosa, and tongue normal  NECK: supple, thyroid  normal size, non-tender, without nodularity LYMPH:  no palpable lymphadenopathy in the cervical, axillary or inguinal LUNGS: clear to auscultation and percussion with normal breathing effort HEART: regular rate & rhythm and no murmurs and no lower extremity edema ABDOMEN:abdomen soft, non-tender and normal bowel sounds Musculoskeletal:no cyanosis of digits and no clubbing  PSYCH: alert & oriented x 3 with fluent speech NEURO: no focal motor/sensory deficits  LABORATORY DATA:  I have reviewed the data as listed Recent Results (from the past 2160 hours)  Lab report - scanned     Status: None   Collection Time: 08/12/23 12:00 AM  Result Value Ref Range   A1c 6.5     Comment: ABSTRACTED BY HIM   EGFR 96.0     Comment: ABSTRACTED BY HIM

## 2023-09-26 NOTE — Assessment & Plan Note (Signed)
 The patient has been donating blood up until several years ago I suspect he might have undiagnosed polycythemia vera Frequent dehydration with poor oral fluid intake probably contributed to very high hemoglobin level He has very high hemoglobin last year He has history of TIA and given his cardiovascular risk profile, he is at very high risk of getting blood clots He will continue taking aspirin  therapy I will order additional workup today and see him back in 2 weeks for results I advised him to start drinking more oral fluid to avoid dehydration

## 2023-09-27 LAB — ERYTHROPOIETIN: Erythropoietin: 6.1 m[IU]/mL (ref 2.6–18.5)

## 2023-10-01 ENCOUNTER — Encounter (HOSPITAL_COMMUNITY): Payer: Self-pay

## 2023-10-01 ENCOUNTER — Emergency Department (HOSPITAL_COMMUNITY)

## 2023-10-01 ENCOUNTER — Observation Stay (HOSPITAL_COMMUNITY)
Admission: EM | Admit: 2023-10-01 | Discharge: 2023-10-02 | Disposition: A | Discharge status: Home / Self Care | Attending: Internal Medicine | Admitting: Internal Medicine

## 2023-10-01 ENCOUNTER — Other Ambulatory Visit: Payer: Self-pay

## 2023-10-01 DIAGNOSIS — F10920 Alcohol use, unspecified with intoxication, uncomplicated: Secondary | ICD-10-CM | POA: Insufficient documentation

## 2023-10-01 DIAGNOSIS — D45 Polycythemia vera: Secondary | ICD-10-CM | POA: Insufficient documentation

## 2023-10-01 DIAGNOSIS — R4701 Aphasia: Secondary | ICD-10-CM | POA: Diagnosis not present

## 2023-10-01 DIAGNOSIS — I509 Heart failure, unspecified: Secondary | ICD-10-CM | POA: Insufficient documentation

## 2023-10-01 DIAGNOSIS — I10 Essential (primary) hypertension: Secondary | ICD-10-CM | POA: Diagnosis present

## 2023-10-01 DIAGNOSIS — I6529 Occlusion and stenosis of unspecified carotid artery: Secondary | ICD-10-CM | POA: Diagnosis not present

## 2023-10-01 DIAGNOSIS — I639 Cerebral infarction, unspecified: Secondary | ICD-10-CM | POA: Diagnosis present

## 2023-10-01 DIAGNOSIS — G459 Transient cerebral ischemic attack, unspecified: Secondary | ICD-10-CM | POA: Diagnosis not present

## 2023-10-01 DIAGNOSIS — E119 Type 2 diabetes mellitus without complications: Secondary | ICD-10-CM | POA: Diagnosis not present

## 2023-10-01 DIAGNOSIS — J45909 Unspecified asthma, uncomplicated: Secondary | ICD-10-CM | POA: Diagnosis not present

## 2023-10-01 DIAGNOSIS — I5022 Chronic systolic (congestive) heart failure: Secondary | ICD-10-CM

## 2023-10-01 DIAGNOSIS — Z79899 Other long term (current) drug therapy: Secondary | ICD-10-CM | POA: Diagnosis not present

## 2023-10-01 DIAGNOSIS — Z794 Long term (current) use of insulin: Secondary | ICD-10-CM | POA: Diagnosis not present

## 2023-10-01 DIAGNOSIS — Z8673 Personal history of transient ischemic attack (TIA), and cerebral infarction without residual deficits: Secondary | ICD-10-CM | POA: Diagnosis not present

## 2023-10-01 DIAGNOSIS — Z7982 Long term (current) use of aspirin: Secondary | ICD-10-CM | POA: Insufficient documentation

## 2023-10-01 DIAGNOSIS — I610 Nontraumatic intracerebral hemorrhage in hemisphere, subcortical: Secondary | ICD-10-CM | POA: Diagnosis not present

## 2023-10-01 DIAGNOSIS — I110 Hypertensive heart disease with heart failure: Secondary | ICD-10-CM | POA: Diagnosis not present

## 2023-10-01 LAB — COMPREHENSIVE METABOLIC PANEL WITH GFR
ALT: 14 U/L (ref 0–44)
AST: 19 U/L (ref 15–41)
Albumin: 3.7 g/dL (ref 3.5–5.0)
Alkaline Phosphatase: 77 U/L (ref 38–126)
Anion gap: 11 (ref 5–15)
BUN: 25 mg/dL — ABNORMAL HIGH (ref 8–23)
CO2: 22 mmol/L (ref 22–32)
Calcium: 9.3 mg/dL (ref 8.9–10.3)
Chloride: 106 mmol/L (ref 98–111)
Creatinine, Ser: 1.01 mg/dL (ref 0.61–1.24)
GFR, Estimated: >60 mL/min (ref >60)
Glucose, Bld: 185 mg/dL — ABNORMAL HIGH (ref 70–99)
Potassium: 4 mmol/L (ref 3.5–5.1)
Sodium: 139 mmol/L (ref 135–145)
Total Bilirubin: 1.2 mg/dL (ref 0.0–1.2)
Total Protein: 7.2 g/dL (ref 6.5–8.1)

## 2023-10-01 LAB — DIFFERENTIAL
Abs Immature Granulocytes: 0.07 K/uL (ref 0.00–0.07)
Basophils Absolute: 0.1 K/uL (ref 0.0–0.1)
Basophils Relative: 1 %
Eosinophils Absolute: 0.1 K/uL (ref 0.0–0.5)
Eosinophils Relative: 1 %
Immature Granulocytes: 1 %
Lymphocytes Relative: 33 %
Lymphs Abs: 3.6 K/uL (ref 0.7–4.0)
Monocytes Absolute: 1.3 K/uL — ABNORMAL HIGH (ref 0.1–1.0)
Monocytes Relative: 12 %
Neutro Abs: 5.6 K/uL (ref 1.7–7.7)
Neutrophils Relative %: 52 %

## 2023-10-01 LAB — I-STAT CHEM 8, ED
BUN: 28 mg/dL — ABNORMAL HIGH (ref 8–23)
Calcium, Ion: 1.14 mmol/L — ABNORMAL LOW (ref 1.15–1.40)
Chloride: 107 mmol/L (ref 98–111)
Creatinine, Ser: 1.1 mg/dL (ref 0.61–1.24)
Glucose, Bld: 179 mg/dL — ABNORMAL HIGH (ref 70–99)
HCT: 57 % — ABNORMAL HIGH (ref 39.0–52.0)
Hemoglobin: 19.4 g/dL — ABNORMAL HIGH (ref 13.0–17.0)
Potassium: 4.1 mmol/L (ref 3.5–5.1)
Sodium: 140 mmol/L (ref 135–145)
TCO2: 21 mmol/L — ABNORMAL LOW (ref 22–32)

## 2023-10-01 LAB — CBC
HCT: 55.8 % — ABNORMAL HIGH (ref 39.0–52.0)
Hemoglobin: 18.9 g/dL — ABNORMAL HIGH (ref 13.0–17.0)
MCH: 31.1 pg (ref 26.0–34.0)
MCHC: 33.9 g/dL (ref 30.0–36.0)
MCV: 91.9 fL (ref 80.0–100.0)
Platelets: 253 K/uL (ref 150–400)
RBC: 6.07 MIL/uL — ABNORMAL HIGH (ref 4.22–5.81)
RDW: 13.9 % (ref 11.5–15.5)
WBC: 10.7 K/uL — ABNORMAL HIGH (ref 4.0–10.5)
nRBC: 0 % (ref 0.0–0.2)

## 2023-10-01 LAB — PROTIME-INR
INR: 1 (ref 0.8–1.2)
Prothrombin Time: 14.2 s (ref 11.4–15.2)

## 2023-10-01 LAB — APTT: aPTT: 30 s (ref 24–36)

## 2023-10-01 LAB — ETHANOL: Alcohol, Ethyl (B): <15 mg/dL (ref <15)

## 2023-10-01 MED ORDER — IOHEXOL 350 MG/ML SOLN
75.0000 mL | Freq: Once | INTRAVENOUS | Status: AC | PRN
  Administered 2023-10-01: 75 mL via INTRAVENOUS

## 2023-10-01 NOTE — ED Notes (Signed)
Dr. Plunkett at bedside.  

## 2023-10-01 NOTE — ED Notes (Signed)
 Patient transported to CT

## 2023-10-01 NOTE — ED Triage Notes (Signed)
 Pt arrives with family- they state they were at dinner when pt had an episode of slurred speech and was speaking gibberish. Speech is now getting back to normal, no facial asymmetry, no numbness. Pt has a hx of TIA.

## 2023-10-01 NOTE — ED Provider Notes (Signed)
 [place] EMERGENCY DEPARTMENT AT [facility] Provider Note   CSN: [id number] Arrival date & time: 10/01/23  1850     Patient presents with: Aphasia   DONEVAN BILLER is a 73 y.o. male.   Patient is a 73 year old male with a history of diabetes, hyperlipidemia, hypertension, CAD, polycythemia vera, CHF with an EF of 30 to 35% who is presenting today with abrupt onset of expressive aphasia.  This occurred while they were sitting at a restaurant tonight getting ready to eat dinner.  His daughter states that he just started talking gibberish and looked like he was trying to figure out what to say and none of the right words were coming out.  It lasted between 15 to 20 minutes and as they were leaving the restaurant it started getting better and now he reports it is completely resolved.  He had no unilateral weakness or numbness.  No facial droop.  He had no difficulty walking.  He denies any headache, visual changes, no chest pain or shortness of breath.  The history is provided by the patient, a relative and medical records.       Prior to Admission medications   Medication Sig Start Date End Date Taking? Authorizing Provider  acetaminophen  (TYLENOL ) 650 MG CR tablet Take 1,300 mg by mouth 2 (two) times daily.    [provider]  Apoaequorin (PREVAGEN PO) Take 1 capsule by mouth daily. Extra Strength    [provider]  Ascorbic Acid  (VITAMIN C ) 500 MG CAPS Take 500 mg by mouth daily.    [provider]  aspirin  (ASPIRIN  CHILDRENS) 81 MG chewable tablet Chew 1 tablet (81 mg total) by mouth daily. 11/09/22   Ladona Heinz, MD  carvedilol  (COREG ) 6.25 MG tablet Take 1 tablet (6.25 mg total) by mouth 2 (two) times daily with a meal. 02/02/23 01/28/24  Ladona Heinz, MD  Cholecalciferol (VITAMIN D) 2000 units CAPS Take 2,000 Units by mouth daily.    [provider]  CINNAMON PO Take 500 mg by mouth 2 (two) times daily.    [provider]   DULoxetine  (CYMBALTA ) 30 MG capsule Take 30 mg by mouth at bedtime.    [provider]  furosemide  (LASIX ) 40 MG tablet Take 1 tablet (40 mg total) by mouth daily. 12/06/22   Rai, Ripudeep MARLA, MD  insulin  NPH (HUMULIN N,NOVOLIN N) 100 UNIT/ML injection Inject 5-25 Units into the skin See admin instructions. Inject 5 units into the skin before breakfast and 25 units before supper    [provider]  insulin  regular (NOVOLIN R,HUMULIN R ) 100 units/mL injection Inject 10-15 Units into the skin See admin instructions. Inject 10 units into the skin before breakfast and 15 units before supper    [provider]  JARDIANCE  10 MG TABS tablet Take 1 tablet (10 mg total) by mouth daily. 02/02/23   Ladona Heinz, MD  Magnesium 500 MG TABS Take 500 mg by mouth daily.    [provider]  MOUNJARO 5 MG/0.5ML Pen Inject 5 mg into the skin once a week.    [provider]  naproxen (NAPROSYN) 500 MG tablet Take 500 mg by mouth 2 (two) times daily. 01/02/23   [provider]  nitroGLYCERIN  (NITROSTAT ) 0.4 MG SL tablet Place 1 tablet (0.4 mg total) under the tongue every 5 (five) minutes as needed for chest pain. 12/06/22 01/13/23  Rai, Ripudeep MARLA, MD  REPATHA SURECLICK 140 MG/ML SOAJ Inject 140 mg as directed every  14 (fourteen) days. 05/31/22   [provider]  sacubitril -valsartan  (ENTRESTO ) 24-26 MG Take 1 tablet by mouth 2 (two) times daily. 02/02/23   Ladona Heinz, MD  traMADol  (ULTRAM ) 50 MG tablet Take 1 tablet (50 mg total) by mouth every 6 (six) hours as needed for moderate pain. Patient taking differently: Take 100 mg by mouth every 6 (six) hours as needed for moderate pain (pain score 4-6). 01/07/22   Shuford, Randine, PA-C  TURMERIC PO Take 1,500 mg by mouth daily.    [provider]    Allergies: Rosiglitazone maleate    Review of Systems  Updated Vital Signs BP 106/71   Pulse 90   Temp 98.2 F (36.8 C) (Oral)   Resp 17   SpO2 98%    Physical Exam Vitals and nursing note reviewed.  Constitutional:      General: He is not in acute distress.    Appearance: He is well-developed.  HENT:     Head: Normocephalic and atraumatic.  Eyes:     Extraocular Movements: Extraocular movements intact.     Conjunctiva/sclera: Conjunctivae normal.     Pupils: Pupils are equal, round, and reactive to light.  Cardiovascular:     Rate and Rhythm: Normal rate and regular rhythm.     Pulses: Normal pulses.     Heart sounds: No murmur heard. Pulmonary:     Effort: Pulmonary effort is normal. No respiratory distress.     Breath sounds: Normal breath sounds. No wheezing or rales.  Abdominal:     General: There is no distension.     Palpations: Abdomen is soft.     Tenderness: There is no abdominal tenderness. There is no guarding or rebound.  Musculoskeletal:        General: No tenderness. Normal range of motion.     Cervical back: Normal range of motion and neck supple.  Skin:    General: Skin is warm and dry.     Findings: No erythema or rash.  Neurological:     Mental Status: He is alert and oriented to person, place, and time. Mental status is at baseline.     Cranial Nerves: No cranial nerve deficit, dysarthria or facial asymmetry.     Sensory: Sensation is intact. No sensory deficit.     Motor: Motor function is intact. No weakness or pronator drift.     Coordination: Finger-Nose-Finger Test and Heel to [place] Test normal.     Gait: Gait is intact. Gait normal.  Psychiatric:        Behavior: Behavior normal.     (all labs ordered are listed, but only abnormal results are displayed) Labs Reviewed  CBC - Abnormal; Notable for the following components:      Result Value   WBC 10.7 (*)    RBC 6.07 (*)    Hemoglobin 18.9 (*)    HCT 55.8 (*)    All other components within normal limits  DIFFERENTIAL - Abnormal; Notable for the following components:   Monocytes Absolute 1.3 (*)    All other components within normal  limits  COMPREHENSIVE METABOLIC PANEL WITH GFR - Abnormal; Notable for the following components:   Glucose, Bld 185 (*)    BUN 25 (*)    All other components within normal limits  I-STAT CHEM 8, ED - Abnormal; Notable for the following components:   BUN 28 (*)    Glucose, Bld 179 (*)    Calcium , Ion 1.14 (*)    TCO2 21 (*)  Hemoglobin 19.4 (*)    HCT 57.0 (*)    All other components within normal limits  ETHANOL  PROTIME-INR  APTT  RAPID URINE DRUG SCREEN, HOSP PERFORMED    EKG: EKG Interpretation Date/Time:  Saturday October 01 2023 18:57:36 EDT Ventricular Rate:  90 PR Interval:  173 QRS Duration:  100 QT Interval:  363 QTC Calculation: 445 R Axis:   -22  Text Interpretation: Sinus rhythm Borderline left axis deviation Low voltage, precordial leads Nonspecific T abnrm, anterolateral leads No significant change since last tracing Confirmed by Doretha Folks ([id number]) on 10/01/2023 7:12:56 PM  Radiology: No results found.   Procedures   Medications Ordered in the ED  iohexol  (OMNIPAQUE ) 350 MG/ML injection 75 mL (75 mLs Intravenous Contrast Given 10/01/23 2042)                                    Medical Decision Making Amount and/or Complexity of Data Reviewed Labs: ordered. Decision-making details documented in ED Course. Radiology: ordered and independent interpretation performed. Decision-making details documented in ED Course. ECG/medicine tests: ordered and independent interpretation performed. Decision-making details documented in ED Course.  Risk Prescription drug management.   Pt with multiple medical problems and comorbidities and presenting today with a complaint that caries a high risk for morbidity and mortality.  Here today with symptoms concerning for TIA.  Patient's symptoms are now resolved.  Lower suspicion for hypoglycemia as patient's symptoms resolved and he did not have to eat for them to get better.  Also patient at higher risk due to his  history of polycythemia vera.  Lower suspicion for intracranial hemorrhage or infectious etiology. TIA workup initiated.  Currently patient is completely symptom-free will watch closely and if he develops symptoms again a code stroke will be called.  9:16 PM I independently interpreted patient's labs and EKG.  EKG without acute findings today, Chem-8 without acute findings but known polycythemia with hemoglobin of 19, EtOH, coags are within normal limits, CBC without other acute findings, I have independently visualized and interpreted pt's images today. CT of the head and neck without acute occlusion.  Spoke to Dr. Deedra with neurology and discussed the patient's case.  He looked through patient's CT of his head and neck and feels that patient would benefit from an MRI given his prior history and what happened today.  If MRI is negative patient can be discharged home.  There is no MRI available at [facility] today and will be going over to [facility] to get the MRI.  All this was discussed with the patient and his wife will drive him over.      Final diagnoses:  TIA (transient ischemic attack)  Aphasia    ED Discharge Orders     None          Doretha Folks, MD 10/01/23 2116

## 2023-10-01 NOTE — ED Provider Notes (Signed)
 Care assumed from Dr. Dean, patient with transient aphasia, pending MRI to rule out stroke. If negative, can be discharged.  MRI shows new focal area of susceptibility in the lateral aspect of the left thalamus with punctate hemorrhage present and felt to represent an acute stroke.  I have independently viewed the images, and agree with radiologist interpretation.  Patient actually wanted to go home, I have advised him that he needs to be admitted for acute stroke.  I have discussed case with Dr. Marcene of [facility], who agrees to admit the patient.  Results for orders placed or performed during the hospital encounter of 10/01/23  Protime-INR   Collection Time: 10/01/23  7:45 PM  Result Value Ref Range   Prothrombin Time 14.2 11.4 - 15.2 seconds   INR 1.0 0.8 - 1.2  APTT   Collection Time: 10/01/23  7:45 PM  Result Value Ref Range   aPTT 30 24 - 36 seconds  CBC   Collection Time: 10/01/23  7:45 PM  Result Value Ref Range   WBC 10.7 (H) 4.0 - 10.5 K/uL   RBC 6.07 (H) 4.22 - 5.81 MIL/uL   Hemoglobin 18.9 (H) 13.0 - 17.0 g/dL   HCT 44.1 (H) 60.9 - 47.9 %   MCV 91.9 80.0 - 100.0 fL   MCH 31.1 26.0 - 34.0 pg   MCHC 33.9 30.0 - 36.0 g/dL   RDW 86.0 88.4 - 84.4 %   Platelets 253 150 - 400 K/uL   nRBC 0.0 0.0 - 0.2 %  Differential   Collection Time: 10/01/23  7:45 PM  Result Value Ref Range   Neutrophils Relative % 52 %   Neutro Abs 5.6 1.7 - 7.7 K/uL   Lymphocytes Relative 33 %   Lymphs Abs 3.6 0.7 - 4.0 K/uL   Monocytes Relative 12 %   Monocytes Absolute 1.3 (H) 0.1 - 1.0 K/uL   Eosinophils Relative 1 %   Eosinophils Absolute 0.1 0.0 - 0.5 K/uL   Basophils Relative 1 %   Basophils Absolute 0.1 0.0 - 0.1 K/uL   Immature Granulocytes 1 %   Abs Immature Granulocytes 0.07 0.00 - 0.07 K/uL  Comprehensive metabolic panel   Collection Time: 10/01/23  7:45 PM  Result Value Ref Range   Sodium 139 135 - 145 mmol/L   Potassium 4.0 3.5 - 5.1 mmol/L   Chloride 106 98 - 111  mmol/L   CO2 22 22 - 32 mmol/L   Glucose, Bld 185 (H) 70 - 99 mg/dL   BUN 25 (H) 8 - 23 mg/dL   Creatinine, Ser 8.98 0.61 - 1.24 mg/dL   Calcium  9.3 8.9 - 10.3 mg/dL   Total Protein 7.2 6.5 - 8.1 g/dL   Albumin 3.7 3.5 - 5.0 g/dL   AST 19 15 - 41 U/L   ALT 14 0 - 44 U/L   Alkaline Phosphatase 77 38 - 126 U/L   Total Bilirubin 1.2 0.0 - 1.2 mg/dL   GFR, Estimated >39 >39 mL/min   Anion gap 11 5 - 15  Ethanol   Collection Time: 10/01/23  7:46 PM  Result Value Ref Range   Alcohol, Ethyl (B) <15 <15 mg/dL  I-stat chem 8, ED   Collection Time: 10/01/23  7:46 PM  Result Value Ref Range   Sodium 140 135 - 145 mmol/L   Potassium 4.1 3.5 - 5.1 mmol/L   Chloride 107 98 - 111 mmol/L   BUN 28 (H) 8 - 23 mg/dL   Creatinine, Ser  1.10 0.61 - 1.24 mg/dL   Glucose, Bld 820 (H) 70 - 99 mg/dL   Calcium , Ion 1.14 (L) 1.15 - 1.40 mmol/L   TCO2 21 (L) 22 - 32 mmol/L   Hemoglobin 19.4 (H) 13.0 - 17.0 g/dL   HCT 42.9 (H) 60.9 - 47.9 %   MR BRAIN WO CONTRAST Result Date: 10/02/2023 EXAM: MRI BRAIN WITHOUT CONTRAST 10/02/2023 04:21:56 AM TECHNIQUE: Multiplanar multisequence MRI of the head/brain was performed without the administration of intravenous contrast. COMPARISON: CT angio head and neck 10/01/2023. MR head without contrast 08/22/2022. Minimal T2 signal adjacent to the hemorrhage is new since the prior study. CLINICAL HISTORY: Transient ischemic attack (TIA). FINDINGS: BRAIN AND VENTRICLES: No acute infarct. No intracranial hemorrhage. No mass. No midline shift. No hydrocephalus. The sella is unremarkable. Normal flow voids. A new focal area of susceptibility is present in the lateral aspect of the left thalamus. Periventricular and subcortical T2 hyperintensities are mildly advanced for age, similar to the prior exam. Remote lacunar infarcts of the cerebellum are stable. ORBITS: Bilateral lens replacements are noted. The globes and orbits are otherwise within normal limits. SINUSES AND MASTOIDS: No  acute abnormality. BONES AND SOFT TISSUES: Normal marrow signal. No acute soft tissue abnormality. IMPRESSION: 1. New focal area of susceptibility in the lateral aspect of the left thalamus is compatible with and interval punctate hemorrhage. This may be acute and related to the patient's symptoms . 2. Mildly advanced periventricular and subcortical T2 hyperintensities, similar to the prior exam. 3. Stable remote lacunar infarcts of the cerebellum. Electronically signed by: Lonni Necessary MD 10/02/2023 04:38 AM EDT RP Workstation: [id number]   CT ANGIO HEAD NECK W WO CM Result Date: 10/01/2023 CLINICAL DATA:  Transient ischemic attack (TIA). EXAM: CT ANGIOGRAPHY HEAD AND NECK WITH AND WITHOUT CONTRAST TECHNIQUE: Multidetector CT imaging of the head and neck was performed using the standard protocol during bolus administration of intravenous contrast. Multiplanar CT image reconstructions and MIPs were obtained to evaluate the vascular anatomy. Carotid stenosis measurements (when applicable) are obtained utilizing NASCET criteria, using the distal internal carotid diameter as the denominator. RADIATION DOSE REDUCTION: This exam was performed according to the departmental dose-optimization program which includes automated exposure control, adjustment of the mA and/or kV according to patient size and/or use of iterative reconstruction technique. CONTRAST:  75mL OMNIPAQUE  IOHEXOL  350 MG/ML SOLN COMPARISON:  None Available. FINDINGS: CT HEAD FINDINGS Brain: No evidence of acute infarction, hemorrhage, hydrocephalus, extra-axial collection or mass lesion/mass effect. Vascular: No hyperdense vessel or unexpected calcification. Skull: Normal. Negative for fracture or focal lesion. Sinuses/Orbits: No acute finding. Review of the MIP images confirms the above findings CTA NECK FINDINGS Aortic arch: Great vessel origins are patent without significant stenosis. Right carotid system: No evidence of dissection, stenosis  (50% or greater), or occlusion. Left carotid system: No evidence of dissection, stenosis (50% or greater), or occlusion. Vertebral arteries: Right-dominant. No evidence of dissection, stenosis (50% or greater), or occlusion. Skeleton: No acute fracture. Other neck: No acute abnormality on limited assessment. Upper chest: Visualized lung apices are clear. Review of the MIP images confirms the above findings CTA HEAD FINDINGS Anterior circulation: Hypoplastic right A1 ACA. Otherwise, bilateral intracranial ICAs, MCAs, and ACAs are patent without proximal hemodynamically significant stenosis. Posterior circulation: Bilateral intradural vertebral arteries, basilar artery and bilateral posterior cerebral arteries are patent without proximal hemodynamically significant stenosis. Venous sinuses: As permitted by contrast timing, patent. Review of the MIP images confirms the above findings IMPRESSION: No large vessel occlusion or  proximal hemodynamically significant stenosis. Electronically Signed   By: Gilmore GORMAN Molt M.D.   On: 10/01/2023 22:36      Raford Lenis, MD 10/02/23 [phone]

## 2023-10-01 NOTE — Plan of Care (Signed)
 On-call note  Called by Dr. Doretha at [facility], ER.  Patient with history of polycythemia and diabetes, presenting for 10 to 15 minutes garbled speech that completely resolved.  No focal deficit at this time.  Has had a similar TIA many years ago. ABCD2 score is 3 at most. At this time, I would recommend getting an MRI to ensure that there was no embolic stroke that caused this.  He is an established patient of [facility]-if MRI is negative, he can follow-up with outpatient neurology at [facility]. Please call back with questions as needed  Eligio Lav, MD Neurology

## 2023-10-01 NOTE — ED Provider Notes (Signed)
 Pt transferred from [facility] for MRI. Pt feels well now.  Plan per neuro is if MRI nl, he can go home.  This is still pending at shift change.  Pt signed out.   Dean Clarity, MD 10/01/23 2252

## 2023-10-02 ENCOUNTER — Encounter (HOSPITAL_COMMUNITY): Payer: Self-pay | Admitting: Internal Medicine

## 2023-10-02 ENCOUNTER — Observation Stay (HOSPITAL_BASED_OUTPATIENT_CLINIC_OR_DEPARTMENT_OTHER)

## 2023-10-02 ENCOUNTER — Observation Stay (HOSPITAL_COMMUNITY)

## 2023-10-02 ENCOUNTER — Emergency Department (HOSPITAL_COMMUNITY)

## 2023-10-02 DIAGNOSIS — I6523 Occlusion and stenosis of bilateral carotid arteries: Secondary | ICD-10-CM | POA: Diagnosis not present

## 2023-10-02 DIAGNOSIS — R29700 NIHSS score 0: Secondary | ICD-10-CM

## 2023-10-02 DIAGNOSIS — R4701 Aphasia: Secondary | ICD-10-CM | POA: Diagnosis not present

## 2023-10-02 DIAGNOSIS — E119 Type 2 diabetes mellitus without complications: Secondary | ICD-10-CM

## 2023-10-02 DIAGNOSIS — I6389 Other cerebral infarction: Secondary | ICD-10-CM | POA: Diagnosis not present

## 2023-10-02 DIAGNOSIS — Z794 Long term (current) use of insulin: Secondary | ICD-10-CM

## 2023-10-02 DIAGNOSIS — I619 Nontraumatic intracerebral hemorrhage, unspecified: Secondary | ICD-10-CM | POA: Diagnosis not present

## 2023-10-02 DIAGNOSIS — G459 Transient cerebral ischemic attack, unspecified: Secondary | ICD-10-CM | POA: Diagnosis not present

## 2023-10-02 DIAGNOSIS — E1151 Type 2 diabetes mellitus with diabetic peripheral angiopathy without gangrene: Secondary | ICD-10-CM

## 2023-10-02 DIAGNOSIS — I639 Cerebral infarction, unspecified: Secondary | ICD-10-CM | POA: Diagnosis present

## 2023-10-02 DIAGNOSIS — R930 Abnormal findings on diagnostic imaging of skull and head, not elsewhere classified: Secondary | ICD-10-CM | POA: Diagnosis not present

## 2023-10-02 DIAGNOSIS — I10 Essential (primary) hypertension: Secondary | ICD-10-CM | POA: Diagnosis not present

## 2023-10-02 DIAGNOSIS — R29818 Other symptoms and signs involving the nervous system: Secondary | ICD-10-CM | POA: Diagnosis not present

## 2023-10-02 DIAGNOSIS — Z8673 Personal history of transient ischemic attack (TIA), and cerebral infarction without residual deficits: Secondary | ICD-10-CM | POA: Diagnosis not present

## 2023-10-02 LAB — RAPID URINE DRUG SCREEN, HOSP PERFORMED
Amphetamines: NOT DETECTED
Barbiturates: NOT DETECTED
Benzodiazepines: NOT DETECTED
Cocaine: NOT DETECTED
Opiates: NOT DETECTED
Tetrahydrocannabinol: NOT DETECTED

## 2023-10-02 LAB — CBC WITH DIFFERENTIAL/PLATELET
Abs Immature Granulocytes: 0.04 K/uL (ref 0.00–0.07)
Basophils Absolute: 0.1 K/uL (ref 0.0–0.1)
Basophils Relative: 1 %
Eosinophils Absolute: 0.1 K/uL (ref 0.0–0.5)
Eosinophils Relative: 1 %
HCT: 56.8 % — ABNORMAL HIGH (ref 39.0–52.0)
Hemoglobin: 18.8 g/dL — ABNORMAL HIGH (ref 13.0–17.0)
Immature Granulocytes: 0 %
Lymphocytes Relative: 36 %
Lymphs Abs: 3.7 K/uL (ref 0.7–4.0)
MCH: 30.5 pg (ref 26.0–34.0)
MCHC: 33.1 g/dL (ref 30.0–36.0)
MCV: 92.1 fL (ref 80.0–100.0)
Monocytes Absolute: 1.4 K/uL — ABNORMAL HIGH (ref 0.1–1.0)
Monocytes Relative: 14 %
Neutro Abs: 4.8 K/uL (ref 1.7–7.7)
Neutrophils Relative %: 48 %
Platelets: 199 K/uL (ref 150–400)
RBC: 6.17 MIL/uL — ABNORMAL HIGH (ref 4.22–5.81)
RDW: 14 % (ref 11.5–15.5)
WBC: 10.1 K/uL (ref 4.0–10.5)
nRBC: 0 % (ref 0.0–0.2)

## 2023-10-02 LAB — COMPREHENSIVE METABOLIC PANEL WITH GFR
ALT: 14 U/L (ref 0–44)
AST: 22 U/L (ref 15–41)
Albumin: 3.8 g/dL (ref 3.5–5.0)
Alkaline Phosphatase: 75 U/L (ref 38–126)
Anion gap: 13 (ref 5–15)
BUN: 23 mg/dL (ref 8–23)
CO2: 21 mmol/L — ABNORMAL LOW (ref 22–32)
Calcium: 8.9 mg/dL (ref 8.9–10.3)
Chloride: 106 mmol/L (ref 98–111)
Creatinine, Ser: 0.94 mg/dL (ref 0.61–1.24)
GFR, Estimated: >60 mL/min (ref >60)
Glucose, Bld: 143 mg/dL — ABNORMAL HIGH (ref 70–99)
Potassium: 3.8 mmol/L (ref 3.5–5.1)
Sodium: 140 mmol/L (ref 135–145)
Total Bilirubin: 1 mg/dL (ref 0.0–1.2)
Total Protein: 6.8 g/dL (ref 6.5–8.1)

## 2023-10-02 LAB — ECHOCARDIOGRAM COMPLETE
Calc EF: 42.8 %
Height: 70 in
S' Lateral: 3.8 cm
Single Plane A2C EF: 44.5 %
Single Plane A4C EF: 39.9 %
Weight: 2800 [oz_av]

## 2023-10-02 LAB — CBG MONITORING, ED: Glucose-Capillary: 124 mg/dL — ABNORMAL HIGH (ref 70–99)

## 2023-10-02 LAB — LIPID PANEL
Cholesterol: 109 mg/dL (ref 0–200)
HDL: 39 mg/dL — ABNORMAL LOW (ref >40)
LDL Cholesterol: 54 mg/dL (ref 0–99)
Total CHOL/HDL Ratio: 2.8 ratio
Triglycerides: 81 mg/dL (ref <150)
VLDL: 16 mg/dL (ref 0–40)

## 2023-10-02 LAB — MISC LABCORP TEST (SEND OUT): Labcorp test code: 489615

## 2023-10-02 LAB — GLUCOSE, CAPILLARY
Glucose-Capillary: 163 mg/dL — ABNORMAL HIGH (ref 70–99)
Glucose-Capillary: 154 mg/dL — ABNORMAL HIGH (ref 70–99)

## 2023-10-02 LAB — HEMOGLOBIN A1C
Hgb A1c MFr Bld: 5.8 % — ABNORMAL HIGH (ref 4.8–5.6)
Mean Plasma Glucose: 119.76 mg/dL

## 2023-10-02 LAB — MAGNESIUM: Magnesium: 2.1 mg/dL (ref 1.7–2.4)

## 2023-10-02 MED ORDER — CLOPIDOGREL BISULFATE 75 MG PO TABS: 75.0000 mg | ORAL_TABLET | Freq: Every day | ORAL | 1 refills | Status: AC

## 2023-10-02 MED ORDER — SENNOSIDES-DOCUSATE SODIUM 8.6-50 MG PO TABS
1.0000 | ORAL_TABLET | Freq: Two times a day (BID) | ORAL | Status: DC
  Administered 2023-10-02: 1 via ORAL
  Filled 2023-10-02: qty 1

## 2023-10-02 MED ORDER — ACETAMINOPHEN 650 MG RE SUPP
650.0000 mg | Freq: Four times a day (QID) | RECTAL | Status: DC | PRN
Start: 2023-10-02 — End: 2023-10-03

## 2023-10-02 MED ORDER — ONDANSETRON HCL 4 MG/2ML IJ SOLN: 4.0000 mg | Freq: Four times a day (QID) | INTRAMUSCULAR | Status: DC | PRN

## 2023-10-02 MED ORDER — STROKE: EARLY STAGES OF RECOVERY BOOK
Freq: Once | Status: AC
  Filled 2023-10-02: qty 1

## 2023-10-02 MED ORDER — INSULIN GLARGINE-YFGN 100 UNIT/ML ~~LOC~~ SOLN
8.0000 [IU] | Freq: Every day | SUBCUTANEOUS | Status: DC
  Filled 2023-10-02 (×3): qty 0.08

## 2023-10-02 MED ORDER — INSULIN ASPART 100 UNIT/ML IJ SOLN
0.0000 [IU] | Freq: Three times a day (TID) | INTRAMUSCULAR | Status: DC
  Administered 2023-10-02 (×2): 2 [IU] via SUBCUTANEOUS
  Administered 2023-10-02: 1 [IU] via SUBCUTANEOUS

## 2023-10-02 MED ORDER — INSULIN ASPART 100 UNIT/ML IJ SOLN: 0.0000 [IU] | Freq: Every day | INTRAMUSCULAR | Status: DC

## 2023-10-02 MED ORDER — MELATONIN 3 MG PO TABS: 3.0000 mg | ORAL_TABLET | Freq: Every evening | ORAL | Status: DC | PRN

## 2023-10-02 MED ORDER — ACETAMINOPHEN 325 MG PO TABS
650.0000 mg | ORAL_TABLET | Freq: Four times a day (QID) | ORAL | Status: DC | PRN
Start: 2023-10-02 — End: 2023-10-03

## 2023-10-02 MED ORDER — PANTOPRAZOLE SODIUM 40 MG IV SOLR: 40.0000 mg | Freq: Every day | INTRAVENOUS | Status: DC

## 2023-10-02 MED ORDER — CARVEDILOL 3.125 MG PO TABS: 3.1250 mg | ORAL_TABLET | Freq: Two times a day (BID) | ORAL | 1 refills | Status: AC

## 2023-10-02 NOTE — ED Notes (Signed)
 Breakfast tray delivered

## 2023-10-02 NOTE — Evaluation (Signed)
 Occupational Therapy Evaluation and DC Summary Patient Details Name: Alan Wang MRN: [id number] DOB: 08-17-1950 Today's Date: 10/02/2023   History of Present Illness   73 y.o. male presented to the emergency department 7/26, after he had a 15-minute episode of slurred speech and speaking gibberish while at dinner with family. MRI - Nontraumatic punctate left thalamic hemorrhage. PMH: diabetes, hypertension, hyperlipidemia, prior TIAs with no residual deficits, coronary artery disease, polycythemia vera.     Clinical Impressions Pt admitted for above, PTA pt was ind with his ADLs/iADLs and mobility, has some falls hx. Pt currently presenting close to functional baseline ind with his ADLs and mobility still. He has some baseline RUE deficits but not other noted deficits in strength or coordination. OT signing off, reconsult if needed.      If plan is discharge home, recommend the following:   Other (comment) (PRN)     Functional Status Assessment   Patient has not had a recent decline in their functional status     Equipment Recommendations   None recommended by OT     Recommendations for Other Services         Precautions/Restrictions   Precautions Precautions: Fall Recall of Precautions/Restrictions: Intact Restrictions Weight Bearing Restrictions Per Provider Order: No     Mobility Bed Mobility Overal bed mobility: Independent                  Transfers Overall transfer level: Modified independent Equipment used: None               General transfer comment: no AD, slow to rise during initial two stands.      Balance Overall balance assessment: Mild deficits observed, not formally tested                                         ADL either performed or assessed with clinical judgement   ADL Overall ADL's : Independent                                             Vision Patient Visual Report: No  change from baseline Vision Assessment?: Yes Eye Alignment: Within Functional Limits Ocular Range of Motion: Within Functional Limits Alignment/Gaze Preference: Within Defined Limits Tracking/Visual Pursuits: Able to track stimulus in all quads without difficulty Convergence: Within functional limits Visual Fields: No apparent deficits     Perception Perception: Within Functional Limits       Praxis Praxis: WFL       Pertinent Vitals/Pain Pain Assessment Pain Assessment: No/denies pain     Extremity/Trunk Assessment Upper Extremity Assessment Upper Extremity Assessment: Overall WFL for tasks assessed;RUE deficits/detail;Right hand dominant (bilat coordination and strength intact) RUE Deficits / Details: hx of shoulder replacement that never fully recovered. AROM shoulder flexion 90 deg with 3+/5 MMT. other joints 5/5. RUE Sensation: WNL RUE Coordination: WNL           Communication Communication Communication: No apparent difficulties   Cognition Arousal: Alert Behavior During Therapy: WFL for tasks assessed/performed Cognition: No apparent impairments                               Following commands: Intact  Cueing  General Comments   Cueing Techniques: Verbal cues      Exercises     Shoulder Instructions      Home Living   Living Arrangements: Spouse/significant other                           Home Equipment: Rollator (4 wheels);Cane - quad;Cane - single point;Shower seat - built in;Grab bars - tub/shower;Hand held shower head;Lift chair   Additional Comments: Handicap accessible shower/tub      Prior Functioning/Environment Prior Level of Function : Independent/Modified Independent             Mobility Comments: no AD ADLs Comments: ind    OT Problem List: Other (comment) (n/a)   OT Treatment/Interventions:        OT Goals(Current goals can be found in the care plan section)   Acute Rehab OT Goals OT  Goal Formulation: All assessment and education complete, DC therapy Time For Goal Achievement: 10/16/23 Potential to Achieve Goals: Good   OT Frequency:       Co-evaluation              AM-PAC OT 6 Clicks Daily Activity     Outcome Measure Help from another person eating meals?: None Help from another person taking care of personal grooming?: None Help from another person toileting, which includes using toliet, bedpan, or urinal?: None Help from another person bathing (including washing, rinsing, drying)?: None Help from another person to put on and taking off regular upper body clothing?: None Help from another person to put on and taking off regular lower body clothing?: None 6 Click Score: 24   End of Session Nurse Communication: Mobility status  Activity Tolerance: Patient tolerated treatment well Patient left: with call bell/phone within reach;in bed;with family/visitor present  OT Visit Diagnosis: Cognitive communication deficit (R41.841) Symptoms and signs involving cognitive functions: Other Nontraumatic ICH                Time: 1645-1700 OT Time Calculation (min): 15 min Charges:  OT General Charges $OT Visit: 1 Visit OT Evaluation $OT Eval Low Complexity: 1 Low  10/02/2023  AB, OTR/L  Acute Rehabilitation Services  Office: [phone]   Curtistine JONETTA Das 10/02/2023, 5:34 PM

## 2023-10-02 NOTE — Progress Notes (Signed)
 Patient received in the unit. A/O x 4. Patient on telemetry. Skin assessed with another RN. Call bell within reach

## 2023-10-02 NOTE — Plan of Care (Signed)
  Problem: Education: Goal: Knowledge of disease or condition will improve 10/02/2023 1453 by Maralyn Siskin, RN Outcome: Progressing 10/02/2023 1449 by Maralyn Siskin, RN Outcome: Progressing Goal: Knowledge of secondary prevention will improve (MUST DOCUMENT ALL) 10/02/2023 1453 by Maralyn Siskin, RN Outcome: Progressing 10/02/2023 1449 by Maralyn Siskin, RN Outcome: Progressing Goal: Knowledge of patient specific risk factors will improve (DELETE if not current risk factor) 10/02/2023 1453 by Maralyn Siskin, RN Outcome: Progressing 10/02/2023 1449 by Maralyn Siskin, RN Outcome: Progressing   Problem: Ischemic Stroke/TIA Tissue Perfusion: Goal: Complications of ischemic stroke/TIA will be minimized Outcome: Progressing   Problem: Coping: Goal: Will verbalize positive feelings about self Outcome: Progressing   Problem: Health Behavior/Discharge Planning: Goal: Goals will be collaboratively established with patient/family Outcome: Progressing

## 2023-10-02 NOTE — Progress Notes (Addendum)
 STROKE TEAM PROGRESS NOTE   INTERIM HISTORY/SUBJECTIVE Patient presented with episode of transient speech difficulties and gibberish speech which appears to have improved.  CT scan and MRI scan showed a small hemorrhagic left thalamic infarct with evidence of old prior lacunar infarcts of remote age likely from small vessel disease.  CT angiogram showed no significant large vessel stenosis or occlusion.  Echocardiogram is pending.  Plan to hold ASA and start plavix  in 3 days.  Symptoms have resolved.   OBJECTIVE  CBC    Component Value Date/Time   WBC 10.1 10/02/2023 0558   RBC 6.17 (H) 10/02/2023 0558   HGB 18.8 (H) 10/02/2023 0558   HGB 18.6 (H) 09/26/2023 1240   HGB 17.9 (H) 01/11/2023 1501   HGB 15.9 09/22/2015 1525   HCT 56.8 (H) 10/02/2023 0558   HCT 54.8 (H) 01/11/2023 1501   HCT 46.4 09/22/2015 1525   PLT 199 10/02/2023 0558   PLT 181 09/26/2023 1240   PLT 197 01/11/2023 1501   MCV 92.1 10/02/2023 0558   MCV 90 01/11/2023 1501   MCV 92.1 09/22/2015 1525   MCH 30.5 10/02/2023 0558   MCHC 33.1 10/02/2023 0558   RDW 14.0 10/02/2023 0558   RDW 13.6 01/11/2023 1501   RDW 13.9 09/22/2015 1525   LYMPHSABS 3.7 10/02/2023 0558   LYMPHSABS 2.4 09/22/2015 1525   MONOABS 1.4 (H) 10/02/2023 0558   MONOABS 0.8 09/22/2015 1525   EOSABS 0.1 10/02/2023 0558   EOSABS 0.1 09/22/2015 1525   BASOSABS 0.1 10/02/2023 0558   BASOSABS 0.0 09/22/2015 1525    BMET    Component Value Date/Time   NA 140 10/02/2023 0558   NA 143 01/11/2023 1501   NA 140 09/22/2015 1526   K 3.8 10/02/2023 0558   K 3.9 09/22/2015 1526   CL 106 10/02/2023 0558   CO2 21 (L) 10/02/2023 0558   CO2 27 09/22/2015 1526   GLUCOSE 143 (H) 10/02/2023 0558   GLUCOSE 75 09/22/2015 1526   BUN 23 10/02/2023 0558   BUN 22 01/11/2023 1501   BUN 15.8 09/22/2015 1526   CREATININE 0.94 10/02/2023 0558   CREATININE 0.88 09/26/2023 1240   CREATININE 0.88 02/09/2021 1348   CREATININE 0.8 09/22/2015 1526   CALCIUM  8.9  10/02/2023 0558   CALCIUM  9.0 09/22/2015 1526   EGFR 96.0 08/12/2023 0000   EGFR 93 01/11/2023 1501   GFRNONAA >60 10/02/2023 0558   GFRNONAA >60 09/26/2023 1240   GFRNONAA 90 07/10/2020 0000    IMAGING past 24 hours MR BRAIN WO CONTRAST Result Date: 10/02/2023 EXAM: MRI BRAIN WITHOUT CONTRAST 10/02/2023 04:21:56 AM TECHNIQUE: Multiplanar multisequence MRI of the head/brain was performed without the administration of intravenous contrast. COMPARISON: CT angio head and neck 10/01/2023. MR head without contrast 08/22/2022. Minimal T2 signal adjacent to the hemorrhage is new since the prior study. CLINICAL HISTORY: Transient ischemic attack (TIA). FINDINGS: BRAIN AND VENTRICLES: No acute infarct. No intracranial hemorrhage. No mass. No midline shift. No hydrocephalus. The sella is unremarkable. Normal flow voids. A new focal area of susceptibility is present in the lateral aspect of the left thalamus. Periventricular and subcortical T2 hyperintensities are mildly advanced for age, similar to the prior exam. Remote lacunar infarcts of the cerebellum are stable. ORBITS: Bilateral lens replacements are noted. The globes and orbits are otherwise within normal limits. SINUSES AND MASTOIDS: No acute abnormality. BONES AND SOFT TISSUES: Normal marrow signal. No acute soft tissue abnormality. IMPRESSION: 1. New focal area of susceptibility in the lateral aspect  of the left thalamus is compatible with and interval punctate hemorrhage. This may be acute and related to the patient's symptoms . 2. Mildly advanced periventricular and subcortical T2 hyperintensities, similar to the prior exam. 3. Stable remote lacunar infarcts of the cerebellum. Electronically signed by: Lonni Necessary MD 10/02/2023 04:38 AM EDT RP Workstation: [id number]   CT ANGIO HEAD NECK W WO CM Result Date: 10/01/2023 CLINICAL DATA:  Transient ischemic attack (TIA). EXAM: CT ANGIOGRAPHY HEAD AND NECK WITH AND WITHOUT CONTRAST TECHNIQUE:  Multidetector CT imaging of the head and neck was performed using the standard protocol during bolus administration of intravenous contrast. Multiplanar CT image reconstructions and MIPs were obtained to evaluate the vascular anatomy. Carotid stenosis measurements (when applicable) are obtained utilizing NASCET criteria, using the distal internal carotid diameter as the denominator. RADIATION DOSE REDUCTION: This exam was performed according to the departmental dose-optimization program which includes automated exposure control, adjustment of the mA and/or kV according to patient size and/or use of iterative reconstruction technique. CONTRAST:  75mL OMNIPAQUE  IOHEXOL  350 MG/ML SOLN COMPARISON:  None Available. FINDINGS: CT HEAD FINDINGS Brain: No evidence of acute infarction, hemorrhage, hydrocephalus, extra-axial collection or mass lesion/mass effect. Vascular: No hyperdense vessel or unexpected calcification. Skull: Normal. Negative for fracture or focal lesion. Sinuses/Orbits: No acute finding. Review of the MIP images confirms the above findings CTA NECK FINDINGS Aortic arch: Great vessel origins are patent without significant stenosis. Right carotid system: No evidence of dissection, stenosis (50% or greater), or occlusion. Left carotid system: No evidence of dissection, stenosis (50% or greater), or occlusion. Vertebral arteries: Right-dominant. No evidence of dissection, stenosis (50% or greater), or occlusion. Skeleton: No acute fracture. Other neck: No acute abnormality on limited assessment. Upper chest: Visualized lung apices are clear. Review of the MIP images confirms the above findings CTA HEAD FINDINGS Anterior circulation: Hypoplastic right A1 ACA. Otherwise, bilateral intracranial ICAs, MCAs, and ACAs are patent without proximal hemodynamically significant stenosis. Posterior circulation: Bilateral intradural vertebral arteries, basilar artery and bilateral posterior cerebral arteries are patent  without proximal hemodynamically significant stenosis. Venous sinuses: As permitted by contrast timing, patent. Review of the MIP images confirms the above findings IMPRESSION: No large vessel occlusion or proximal hemodynamically significant stenosis. Electronically Signed   By: Gilmore GORMAN Molt M.D.   On: 10/01/2023 22:36    Vitals:   10/02/23 0345 10/02/23 0601 10/02/23 0602 10/02/23 0721  BP: 138/63 117/71  128/84  Pulse: 85 88  85  Resp: 14 10  15   Temp:   97.9 F (36.6 C) 98.2 F (36.8 C)  TempSrc:   Oral Oral  SpO2: 100% 99%  96%  Weight:      Height:         PHYSICAL EXAM General:  Alert, well-nourished, well-developed patient in no acute distress Psych:  Mood and affect appropriate for situation CV: Regular rate and rhythm on monitor Respiratory:  Regular, unlabored respirations on room air GI: Abdomen soft and nontender   NEURO:  Mental Status: AA&Ox3, patient is able to give clear and coherent history Speech/Language: speech is without dysarthria or aphasia.  Naming, repetition, fluency, and comprehension intact.  Cranial Nerves:  II: PERRL. Visual fields full.  III, IV, VI: EOMI. Eyelids elevate symmetrically.  V: Sensation is intact to light touch and symmetrical to face.  VII: Face is symmetrical resting and smiling VIII: hearing intact to voice. IX, X: Palate elevates symmetrically. Phonation is normal.  KP:Dynloizm shrug 5/5. XII: tongue is midline without fasciculations. Motor: 5/5  strength to all muscle groups tested.  Tone: is normal and bulk is normal Sensation- Intact to light touch bilaterally. Extinction absent to light touch to DSS.   Coordination: FTN intact bilaterally, HKS: no ataxia in BLE.No drift.  Gait- deferred  Most Recent NIH 0     ASSESSMENT/PLAN  [name] is a 73 y.o. male with history of diabetes, hypertension, hyperlipidemia, prior TIAs with no residual deficits, coronary artery disease, polycythemia vera-presented to  the emergency department, when he had a 15-minute episode of slurred speech and speaking gibberish while at dinner with family. This happened somewhere around 6:30 PM.  Small left thalamic hemorrhage infarct  Etiology:  small vessel disease   CTA head & neck No large vessel occlusion or proximal hemodynamically significant stenosis. On further review area of hyperdensity in left thalamus correlates with MRI  MRI  small area of susceptibility in the left thalamus  2D Echo Pending  LDL 54 HgbA1c 5.8 VTE prophylaxis - SCDs aspirin  81 mg daily prior to admission, start on plavix  in 3 days (7/30) Therapy recommendations:  Outpatient PT/OT/ST Disposition:  Pending completion of work up   Hypertension HF rEF Home meds:  Jardiance , Coreg , Entersto, resumed in hospital Repeat Echo pending  Follows with Dr. Ladona  CAD Hyperlipidemia LDL 54, goal < 70- on repatha continue at discharge Previously on ASA  Angio in Nov 2024 Patent mid LAD stent Patent mid Circumflex stent.  Large intermediate branch with mild ostial stenosis Large dominant RCA with diffuse mild calcified plaque in the proximal, mid and distal segments. Chronic occlusion of the right posterolateral artery.   Hospital day # 0   Patient seen and examined by NP/APP with MD. MD to update note as needed.   Jorene Last, DNP, FNP-BC [facility] Pager: [phone]  I have personally obtained history,examined this patient, reviewed notes, independently viewed imaging studies, participated in medical decision making and plan of care.ROS completed by me personally and pertinent positives fully documented  I have made any additions or clarifications directly to the above note. Agree with note above.  Patient presented with transient speech difficulties and gibberish speech due to small left thalamic lacunar infarct with hemorrhagic transformation.  Recommend hold aspirin  for 3 days and then switch to Plavix  75 mg daily.   Continue ongoing stroke workup.  Maintain aggressive risk factor modification.  Patient likely discharged later today after echo results.  Follow-up as an outpatient with stroke clinic with nurse practitioner in 2 months.  Long discussion with patient and family at the bedside and answered questions.  Discussed with Dr. Rosario.   I personally spent a total of 50 minutes in the care of the patient today including getting/reviewing separately obtained history, performing a medically appropriate exam/evaluation, counseling and educating, placing orders, referring and communicating with other health care professionals, documenting clinical information in the EHR, independently interpreting results, and coordinating care.        Eather Popp, MD Medical Director [facility] Pager: [phone] 10/02/2023 2:17 PM  To contact Stroke Continuity provider, please refer to [email or web address]. After hours, contact General Neurology

## 2023-10-02 NOTE — Progress Notes (Signed)
  Echocardiogram 2D Echocardiogram has been performed.  Alan Wang 10/02/2023, 4:26 PM

## 2023-10-02 NOTE — ED Notes (Signed)
PT working with patient.

## 2023-10-02 NOTE — Consult Note (Signed)
 NEUROLOGY CONSULT NOTE   Date of service: October 02, 2023 Patient Name: Alan Wang MRN:  [id number] DOB:  April 01, 1950 Chief Complaint: Speech issues Requesting Provider: Marcene Eva NOVAK, DO  History of Present Illness  Alan Wang is a 73 y.o. male with hx of diabetes, hypertension, hyperlipidemia, prior TIAs with no residual deficits, coronary artery disease, polycythemia vera-presented to the emergency department, when he had a 15-minute episode of slurred speech and speaking gibberish while at dinner with family.  This happened somewhere around 6:30 PM. Symptoms resolved by the time he came into [facility].  Head CT and CT angiography head and neck was done which was read as unremarkable.  Case was discussed with me-his ABCD 2 score was low.  I recommended getting an MRI just to ensure there is no acute process, for which she was transferred [facility], the MRI revealed a small area of susceptibility in the left thalamus and in hindsight, the CT head also had a hyperdensity in that area-concern for acute hemorrhage versus possible hemorrhagic transformation of an ischemic infarct but no DWI changes were seen so hemorrhage more likely.  Denies headaches.  Denies visual changes.  LKW: 6:30 PM 10/01/2023 Modified rankin score: 0-Completely asymptomatic and back to baseline post- stroke IV Thrombolysis: NIH 0 EVT: No ELVO, NIH 0 NIH stroke scale: 0   ROS  Comprehensive ROS performed and pertinent positives documented in HPI   Past History   Past Medical History:  Diagnosis Date   Arthritis    Asthma    Cancer (HCC)    skin   Coronary artery disease    COVID    Diabetes mellitus    type 2, insulin  dependent for 30+ years   History of kidney stones 11/2012   Hyperlipemia    Hypertension    IDDM (insulin  dependent diabetes mellitus)    Shortness of breath    Stroke (HCC)    hx TIA    Past Surgical History:  Procedure Laterality  Date   ANGIOPLASTY  04/06/2011   x3    CARDIAC CATHETERIZATION     CARDIAC CATHETERIZATION N/A 09/16/2015   Procedure: Left Heart Cath and Coronary Angiography;  Surgeon: Gordy Bergamo, MD;  Location: [facility] INVASIVE CV LAB;  Service: Cardiovascular;  Laterality: N/A;   cataracts     CHOLECYSTECTOMY     HERNIA REPAIR     x2   KNEE ARTHROSCOPY     LEFT HEART CATH AND CORONARY ANGIOGRAPHY N/A 01/19/2023   Procedure: LEFT HEART CATH AND CORONARY ANGIOGRAPHY;  Surgeon: Verlin Lonni JONETTA, MD;  Location: [facility] INVASIVE CV LAB;  Service: Cardiovascular;  Laterality: N/A;   LEFT HEART CATHETERIZATION WITH CORONARY ANGIOGRAM N/A 03/08/2011   Procedure: LEFT HEART CATHETERIZATION WITH CORONARY ANGIOGRAM;  Surgeon: Erick JONELLE Bergamo, MD;  Location: [facility] CATH LAB;  Service: Cardiovascular;  Laterality: N/A;   PERCUTANEOUS CORONARY STENT INTERVENTION (PCI-S) N/A 04/06/2011   Procedure: PERCUTANEOUS CORONARY STENT INTERVENTION (PCI-S);  Surgeon: Erick JONELLE Bergamo, MD;  Location: [facility] CATH LAB;  Service: Cardiovascular;  Laterality: N/A;   TOTAL SHOULDER ARTHROPLASTY Right 01/07/2022   Procedure: TOTAL SHOULDER ARTHROPLASTY;  Surgeon: Melita Drivers, MD;  Location: [facility] ORS;  Service: Orthopedics;  Laterality: Right;     Family History: Family History  Problem Relation Age of Onset   Emphysema Mother    COPD Mother    Alzheimer's disease Father    Asthma Paternal Aunt    Heart attack Paternal  Grandfather     Social History  reports that he has never smoked. He has never used smokeless tobacco. He reports current alcohol use. He reports that he does not use drugs.  Allergies  Allergen Reactions   Rosiglitazone Maleate Swelling    rosiglitazone    Medications   Current Facility-Administered Medications:     stroke: early stages of recovery book, , Does not apply, Once, Howerter, Justin B, DO   acetaminophen  (TYLENOL ) tablet 650 mg, 650 mg, Oral, Q6H PRN **OR** acetaminophen  (TYLENOL ) suppository  650 mg, 650 mg, Rectal, Q6H PRN, Howerter, Justin B, DO   melatonin tablet 3 mg, 3 mg, Oral, QHS PRN, Howerter, Justin B, DO   ondansetron  (ZOFRAN ) injection 4 mg, 4 mg, Intravenous, Q6H PRN, Howerter, Justin B, DO  Current Outpatient Medications:    acetaminophen  (TYLENOL ) 650 MG CR tablet, Take 1,300 mg by mouth 2 (two) times daily., Disp: , Rfl:    Apoaequorin (PREVAGEN PO), Take 1 capsule by mouth daily. Extra Strength, Disp: , Rfl:    Ascorbic Acid  (VITAMIN C ) 500 MG CAPS, Take 500 mg by mouth daily., Disp: , Rfl:    aspirin  (ASPIRIN  CHILDRENS) 81 MG chewable tablet, Chew 1 tablet (81 mg total) by mouth daily., Disp: , Rfl:    carvedilol  (COREG ) 6.25 MG tablet, Take 1 tablet (6.25 mg total) by mouth 2 (two) times daily with a meal., Disp: 180 tablet, Rfl: 3   Cholecalciferol (VITAMIN D) 2000 units CAPS, Take 2,000 Units by mouth daily., Disp: , Rfl:    CINNAMON PO, Take 500 mg by mouth 2 (two) times daily., Disp: , Rfl:    DULoxetine  (CYMBALTA ) 30 MG capsule, Take 30 mg by mouth at bedtime., Disp: , Rfl:    furosemide  (LASIX ) 40 MG tablet, Take 1 tablet (40 mg total) by mouth daily., Disp: 30 tablet, Rfl: 2   insulin  NPH (HUMULIN N,NOVOLIN N) 100 UNIT/ML injection, Inject 5-25 Units into the skin See admin instructions. Inject 5 units into the skin before breakfast and 25 units before supper, Disp: , Rfl:    insulin  regular (NOVOLIN R,HUMULIN R ) 100 units/mL injection, Inject 10-15 Units into the skin See admin instructions. Inject 10 units into the skin before breakfast and 15 units before supper, Disp: , Rfl:    JARDIANCE  10 MG TABS tablet, Take 1 tablet (10 mg total) by mouth daily., Disp: 90 tablet, Rfl: 3   Magnesium 500 MG TABS, Take 500 mg by mouth daily., Disp: , Rfl:    MOUNJARO 5 MG/0.5ML Pen, Inject 5 mg into the skin once a week., Disp: , Rfl:    naproxen (NAPROSYN) 500 MG tablet, Take 500 mg by mouth 2 (two) times daily., Disp: , Rfl:    nitroGLYCERIN  (NITROSTAT ) 0.4 MG SL  tablet, Place 1 tablet (0.4 mg total) under the tongue every 5 (five) minutes as needed for chest pain., Disp: 30 tablet, Rfl: 0   REPATHA SURECLICK 140 MG/ML SOAJ, Inject 140 mg as directed every 14 (fourteen) days., Disp: , Rfl:    sacubitril -valsartan  (ENTRESTO ) 24-26 MG, Take 1 tablet by mouth 2 (two) times daily., Disp: 180 tablet, Rfl: 3   traMADol  (ULTRAM ) 50 MG tablet, Take 1 tablet (50 mg total) by mouth every 6 (six) hours as needed for moderate pain. (Patient taking differently: Take 100 mg by mouth every 6 (six) hours as needed for moderate pain (pain score 4-6).), Disp: 20 tablet, Rfl: 0   TURMERIC PO, Take 1,500 mg by mouth daily., Disp: ,  Rfl:   Vitals   Vitals:   10/02/23 0130 10/02/23 0215 10/02/23 0245 10/02/23 0345  BP: 129/82 124/85 118/72 138/63  Pulse: 87 82 82 85  Resp: 11 17 17 14   Temp:      TempSrc:      SpO2: 100% 98% 97% 100%  Weight:      Height:        Body mass index is 25.11 kg/m.   Physical Exam   GEN: Well-developed well-nourished man in no acute distress HEENT: Normocephalic atraumatic Lungs: Clear Cardiovascular: Regular rhythm Neurological exam Awake alert oriented x 3, no dysarthria, no aphasia, cranial nerves II to XII intact, motor examination with no drift in any of the 4 extremities, sensation intact light touch, coordination examination with no dysmetria.  Gait testing deferred.  Labs/Imaging/Neurodiagnostic studies   CBC:  Recent Labs  Lab 10/19/2023 1240 10/01/23 1945 10/01/23 1946  WBC 6.7 10.7*  --   NEUTROABS 3.4 5.6  --   HGB 18.6* 18.9* 19.4*  HCT 55.1* 55.8* 57.0*  MCV 90.3 91.9  --   PLT 181 253  --    Basic Metabolic Panel:  Lab Results  Component Value Date   NA 140 10/01/2023   K 4.1 10/01/2023   CO2 22 10/01/2023   GLUCOSE 179 (H) 10/01/2023   BUN 28 (H) 10/01/2023   CREATININE 1.10 10/01/2023   CALCIUM  9.3 10/01/2023   GFRNONAA >60 10/01/2023   GFRAA 104 07/10/2020   Lipid Panel:  Lab Results   Component Value Date   LDLCALC 59 06/23/2016   HgbA1c:  Lab Results  Component Value Date   HGBA1C 6.8 (H) 12/04/2022   Alcohol Level     Component Value Date/Time   [facility] <15 10/01/2023 1946   INR  Lab Results  Component Value Date   INR 1.0 10/01/2023   APTT  Lab Results  Component Value Date   APTT 30 10/01/2023    CT Head without contrast(Personally reviewed): Possible small hyperdensity in the left thalamus-not read on the official reading  CT angio Head and Neck with contrast(Personally reviewed): No ELVO, no significant stenosis in the anterior or posterior circulation  MRI Brain(Personally reviewed): Small punctate area of susceptibility artifact seen in the left thalamus-concern for acute hemorrhage   ASSESSMENT   JERAMIA SALEEBY is a 73 y.o. male with multiple cerebrovascular risk factors presenting for evaluation of 15 minutes worth of slurred speech and gibberish speech which had resolved upon arrival to [facility], ER.  CT head and CT angiography head and neck was read as unremarkable but there is probably a small hyperdensity in the left thalamus, which in hindsight after the MRI seems to be pertinent since there is susceptibility artifact in that same area indicating that this might be an acute small punctate hemorrhage. His clinical symptoms could have been related to this acute punctate hemorrhage in the left thalamus.  Ischemic infarct with hemorrhagic transformation is less likely given lack of DWI changes altogether.  IMPRESSION: Nontraumatic punctate left thalamic hemorrhage, patient did not comment hypertensive-etiology is not hypertensive hemorrhage at this time.  Etiology likely small vessel disease in the setting of multiple cerebrovascular risk factors.  RECOMMENDATIONS  Admit to hospitalist Frequent rechecks Telemetry Strict blood pressure goal-systolic 130-150. 2D echo A1c Lipid panel Repeat head CT at noon.  Please call stroke team to  update them that the scan has been done. He is on home aspirin -hold for now. No antiplatelets or anticoagulants-SCD for DVT prophylaxis.  Stroke team to follow Plan discussed with Dr. Marcene & Dr. Raford  ______________________________________________________________________    Signed, Eligio Lav, MD [facility]

## 2023-10-02 NOTE — TOC Transition Note (Signed)
 Transition of Care [facility]) - Discharge Note   Patient Details  Name: Alan Wang MRN: [id number] Date of Birth: [birth year]  [facility] CM/SW Contact:  Marval Gell, RN Phone Number: 10/02/2023, 2:03 PM   Clinical Narrative:     Beatris w patient at bedside to review PT recs and set up plan for DC. He believes he may go home today and would like to set up OP services at [facility]. A referral has been placed for PT and added to his AVS. His wife will transport home. He states he has all needed DME at home.  No other needs identified at this time.   Final next level of care: Home/Self Care Barriers to Discharge: No Barriers Identified   Patient Goals and CMS Choice Patient states their goals for this hospitalization and ongoing recovery are:: to go home   Choice offered to / list presented to : Patient      Discharge Placement                       Discharge Plan and Services Additional resources added to the After Visit Summary for                  DME Arranged: N/A                    Social Drivers of Health (SDOH) Interventions SDOH Screenings   Food Insecurity: No Food Insecurity (12/04/2022)  Housing: Low Risk  (12/04/2022)  Transportation Needs: No Transportation Needs (12/04/2022)  Utilities: Not At Risk (12/04/2022)  Tobacco Use: Low Risk  (10/02/2023)     Readmission Risk Interventions    08/23/2022    1:16 PM  Readmission Risk Prevention Plan  Post Dischage Appt Complete  Medication Screening Complete  Transportation Screening Complete

## 2023-10-02 NOTE — Discharge Summary (Addendum)
 Physician Discharge Summary  Patient ID: Alan Wang MRN: [id number] DOB/AGE: 75-Jul-1952 72 y.o.  Admit date: 10/01/2023 Discharge date: 10/02/2023  Admission Diagnoses:  Discharge Diagnoses:  Principal Problem:   Expressive aphasia   Hemorrhagic thalamic infarct      Active Problems:   Essential hypertension   DM2 (diabetes mellitus, type 2) (HCC)   Acute ischemic stroke [facility])    Discharged Condition: stable  Hospital Course: Patient is a 73 year old male with past medical history significant for type 2 diabetes mellitus, hypertension and TIAs.  Patient was admitted with expressive aphasia.  Patient was worked up for possible stroke.  Neurology team assisted in directing patient's care.  MRI of the brain revealed left thalamic punctate hemorrhage.  Neurology team has directed holding aspirin .  Neurology team has advised that patient start Plavix  75 Mg p.o. once daily on Wednesday, 10/04/2023.  Patient understands that aspirin  is on hold.  Patient also understands that he will start Plavix  on Wednesday, 10/04/2023.  Expressive aphasia has resolved.  Neurology team is cleared patient for discharge.  Echocardiogram revealing to improved estimated left ventricular ejection fraction.  Left thalamic punctate hemorrhage: - See above documentation. - Hold aspirin . - Start Plavix  on Wednesday, 10/04/2023.  Hypertension: Type 2 diabetes mellitus:  Consults: neurology  Significant Diagnostic Studies:  MRI brain revealed: 1. New focal area of susceptibility in the lateral aspect of the left thalamus is compatible with and interval punctate hemorrhage. This may be acute and related to the patient's symptoms . 2. Mildly advanced periventricular and subcortical T2 hyperintensities, similar to the prior exam. 3. Stable remote lacunar infarcts of the cerebellum.   CT ANGIOGRAPHY HEAD AND NECK WITH AND WITHOUT CONTRAST   TECHNIQUE: Multidetector CT imaging of the head and neck was  performed using the standard protocol during bolus administration of intravenous contrast. Multiplanar CT image reconstructions and MIPs were obtained to evaluate the vascular anatomy. Carotid stenosis measurements (when applicable) are obtained utilizing NASCET criteria, using the distal internal carotid diameter as the denominator.   RADIATION DOSE REDUCTION: This exam was performed according to the departmental dose-optimization program which includes automated exposure control, adjustment of the mA and/or kV according to patient size and/or use of iterative reconstruction technique.   CONTRAST:  75mL OMNIPAQUE  IOHEXOL  350 MG/ML SOLN   COMPARISON:  None Available.   FINDINGS: CT HEAD FINDINGS   Brain: No evidence of acute infarction, hemorrhage, hydrocephalus, extra-axial collection or mass lesion/mass effect.   Vascular: No hyperdense vessel or unexpected calcification.   Skull: Normal. Negative for fracture or focal lesion.   Sinuses/Orbits: No acute finding.   Review of the MIP images confirms the above findings   CTA NECK FINDINGS   Aortic arch: Great vessel origins are patent without significant stenosis.   Right carotid system: No evidence of dissection, stenosis (50% or greater), or occlusion.   Left carotid system: No evidence of dissection, stenosis (50% or greater), or occlusion.   Vertebral arteries: Right-dominant. No evidence of dissection, stenosis (50% or greater), or occlusion.   Skeleton: No acute fracture.   Other neck: No acute abnormality on limited assessment.   Upper chest: Visualized lung apices are clear.   Review of the MIP images confirms the above findings   CTA HEAD FINDINGS   Anterior circulation: Hypoplastic right A1 ACA. Otherwise, bilateral intracranial ICAs, MCAs, and ACAs are patent without proximal hemodynamically significant stenosis.   Posterior circulation: Bilateral intradural vertebral arteries, basilar artery and  bilateral posterior cerebral arteries are patent  without proximal hemodynamically significant stenosis.   Venous sinuses: As permitted by contrast timing, patent.   Review of the MIP images confirms the above findings   IMPRESSION: No large vessel occlusion or proximal hemodynamically significant stenosis.     Electronically Signed   By: Gilmore GORMAN Molt M.D.   On: 10/01/2023 22:36   Echocardiogram revealed: 1. Left ventricular ejection fraction, by estimation, is 40 to 45%. Left  ventricular ejection fraction by 2D MOD biplane is 42.8 %. The left  ventricle has mildly decreased function. The left ventricle demonstrates  global hypokinesis. There is mild  asymmetric left ventricular hypertrophy of the basal-septal segment.  Indeterminate diastolic filling due to E-A fusion.   2. Right ventricular systolic function is normal. The right ventricular  size is normal.   3. The mitral valve is grossly normal. Trivial mitral valve  regurgitation. No evidence of mitral stenosis.   4. The aortic valve is tricuspid. Aortic valve regurgitation is not  visualized. Aortic valve sclerosis is present, with no evidence of aortic  valve stenosis.   Comparison(s): Changes from prior study are noted. The left ventricular  function has improved.     Treatments: Aspirin  will be on hold.  Stop Plavix  on Wednesday, 10/04/2023.  Discharge Exam: Blood pressure (!) 133/100, pulse 81, temperature 98.8 F (37.1 C), temperature source Oral, resp. rate 17, height 5' 10 (1.778 m), weight 79.4 kg, SpO2 94%.   Disposition: Discharge disposition: 01-Home or Self Care       Discharge Instructions     Diet - low sodium heart healthy   Complete by: As directed    Discharge wound care:   Complete by: As directed    Continue current wound care regimen.   Increase activity slowly   Complete by: As directed       Allergies as of 10/02/2023       Reactions   Rosiglitazone Maleate Swelling    rosiglitazone        Medication List     STOP taking these medications    aspirin  81 MG chewable tablet Commonly known as: Aspirin  Childrens   DULoxetine  30 MG capsule Commonly known as: CYMBALTA    Magnesium 500 MG Tabs   TURMERIC PO   Vitamin C  500 MG Caps   Vitamin D 50 MCG (2000 UT) Caps       TAKE these medications    acetaminophen  650 MG CR tablet Commonly known as: TYLENOL  Take 1,300 mg by mouth as needed for pain.   carvedilol  3.125 MG tablet Commonly known as: COREG  Take 1 tablet (3.125 mg total) by mouth 2 (two) times daily with a meal. What changed:  medication strength how much to take   clopidogrel  75 MG tablet Commonly known as: Plavix  Take 1 tablet (75 mg total) by mouth daily.   Entresto  24-26 MG Generic drug: sacubitril -valsartan  Take 1 tablet by mouth 2 (two) times daily.   insulin  NPH Human 100 UNIT/ML injection Commonly known as: NOVOLIN N Inject 5-25 Units into the skin See admin instructions. Inject 5 units into the skin before breakfast and 25 units before supper   insulin  regular 100 units/mL injection Commonly known as: NOVOLIN R Inject 10-15 Units into the skin See admin instructions. Inject 10 units into the skin before breakfast and 15 units before supper   Jardiance  10 MG Tabs tablet Generic drug: empagliflozin  Take 1 tablet (10 mg total) by mouth daily.   Mounjaro 5 MG/0.5ML Pen Generic drug: tirzepatide Inject 5 mg into  the skin once a week.   nitroGLYCERIN  0.4 MG SL tablet Commonly known as: NITROSTAT  Place 1 tablet (0.4 mg total) under the tongue every 5 (five) minutes as needed for chest pain.   Repatha SureClick 140 MG/ML Soaj Generic drug: Evolocumab Inject 140 mg as directed every 14 (fourteen) days.               Discharge Care Instructions  (From admission, onward)           Start     Ordered   10/02/23 0000  Discharge wound care:       Comments: Continue current wound care regimen.    10/02/23 1757            Time spent: 35 minutes.  SignedBETHA Leatrice LILLETTE Rosario 10/02/2023, 5:58 PM

## 2023-10-02 NOTE — ED Notes (Signed)
 CCMD called.

## 2023-10-02 NOTE — ED Notes (Signed)
 Pt to floor with transport

## 2023-10-02 NOTE — H&P (Signed)
 History and Physical      Alan Wang FMW:[id number] DOB: 17-Aug-1950 DOA: 10/01/2023; DOS: 10/02/2023  PCP: Frederik Charleston, MD  Patient coming from: home   I have personally briefly reviewed patient's old medical records in [facility]  Chief Complaint: Expressive aphasia  HPI: Alan Wang is a 73 y.o. male with medical history significant for type 2 diabetes mellitus, send hypertension, TIAs, who is admitted to [facility] on 10/01/2023 with expressive aphasia presenting acute thalamic stroke with emergent transformation versus small punctate intracranial hemorrhage after presenting from home to [facility] ED complaining of expressive aphasia.   The patient reports that he is at his baseline level of health leading up to 1800 on 10/01/2023, when, will he developed acute onset word finding difficulties, word salad, gibberish speech while having dinner with his daughter at that time.  Not associate with any acute focal weakness.  Acute focal numbness, paresthesias, vertigo, acute change in vision, facial droop, or dysphagia.  We will expression of aphasia gradually improved, now back to baseline.  Patient reports that he has been diagnosed with multiple prior TIAs, with similar presentation, but denies any known history of formal stroke.  Has a history of central pretension, type 2 diabetes mellitus.  Is on daily baby aspirin  as an outpatient.  Not on any statin or other lipid-lowering medication at home.  Denies any known history of paroxysmal atrial fibrillation.  Is a lifelong non-smoker.    ED Course:  Vital signs in the ED were notable for the following: Afebrile; rates in the 80s; systolic blood pressures in the low 100s to 140s.  Labs were notable for the following: CMP notable for sodium 139, potassium 1.01, glucose 185, liver enzymes within normal limits recently seen in follow-up assessment .  Levels of count per CBC noted to be 10.7, but 2.3.  INR 1.0, PTT 30.  Per my  interpretation, EKG in ED demonstrated the following: Sinus rhythm with heart rate 90, normal levels, no evidence of T wave or ST changes, including no evidence of ST ovation.  Imaging in the ED, per corresponding formal radiology read, was notable for the following: Noncontrast CTh evidence of acute intracranial process, Cleen evidence of acute infarct or intracranial hemorrhage.  CTA head and neck showed no evidence of large vessel occlusion.  MRI brain without contrast, showed new focal area of susceptibility in the lateral aspect of the left thalamus, compatible with interval punctate hemorrhage.   Dr. Voncile of neurology is formally consulting. He will place orders via the focused ICH order set, and conveys that we should refrain from antiplatelet and anticoagulant intervention.  Dr. Voncile conveys that the stroke team will continue to follow, and will order updated CT head for further trending of aforementioned small left thalamic hemorrhage.  While in the ED, the following were administered: None  Subsequently, the patient was admitted for observation for further evaluation management of expressive aphasia in the setting of acute thalamic stroke with hemorrhagic transformation versus small punctate intracranial hemorrhage.     Review of Systems: As per HPI otherwise 10 point review of systems negative.   Past Medical History:  Diagnosis Date   Arthritis    Asthma    Cancer (HCC)    skin   Coronary artery disease    COVID    Diabetes mellitus    type 2, insulin  dependent for 30+ years   History of kidney stones 11/2012   Hyperlipemia    Hypertension  IDDM (insulin  dependent diabetes mellitus)    Shortness of breath    Stroke [facility])    hx TIA    Past Surgical History:  Procedure Laterality Date   ANGIOPLASTY  04/06/2011   x3    CARDIAC CATHETERIZATION     CARDIAC CATHETERIZATION N/A 09/16/2015   Procedure: Left Heart Cath and Coronary Angiography;  Surgeon: Gordy Bergamo, MD;   Location: [facility] INVASIVE CV LAB;  Service: Cardiovascular;  Laterality: N/A;   cataracts     CHOLECYSTECTOMY     HERNIA REPAIR     x2   KNEE ARTHROSCOPY     LEFT HEART CATH AND CORONARY ANGIOGRAPHY N/A 01/19/2023   Procedure: LEFT HEART CATH AND CORONARY ANGIOGRAPHY;  Surgeon: Verlin Lonni BIRCH, MD;  Location: [facility] INVASIVE CV LAB;  Service: Cardiovascular;  Laterality: N/A;   LEFT HEART CATHETERIZATION WITH CORONARY ANGIOGRAM N/A 03/08/2011   Procedure: LEFT HEART CATHETERIZATION WITH CORONARY ANGIOGRAM;  Surgeon: Erick JONELLE Bergamo, MD;  Location: [facility] CATH LAB;  Service: Cardiovascular;  Laterality: N/A;   PERCUTANEOUS CORONARY STENT INTERVENTION (PCI-S) N/A 04/06/2011   Procedure: PERCUTANEOUS CORONARY STENT INTERVENTION (PCI-S);  Surgeon: Erick JONELLE Bergamo, MD;  Location: [facility] CATH LAB;  Service: Cardiovascular;  Laterality: N/A;   TOTAL SHOULDER ARTHROPLASTY Right 01/07/2022   Procedure: TOTAL SHOULDER ARTHROPLASTY;  Surgeon: Melita Drivers, MD;  Location: [facility] ORS;  Service: Orthopedics;  Laterality: Right;     Social History:  reports that he has never smoked. He has never used smokeless tobacco. He reports current alcohol use. He reports that he does not use drugs.   Allergies  Allergen Reactions   Rosiglitazone Maleate Swelling    rosiglitazone    Family History  Problem Relation Age of Onset   Emphysema Mother    COPD Mother    Alzheimer's disease Father    Asthma Paternal Aunt    Heart attack Paternal Grandfather     Family history reviewed and not pertinent    Prior to Admission medications   Medication Sig Start Date End Date Taking? Authorizing Provider  acetaminophen  (TYLENOL ) 650 MG CR tablet Take 1,300 mg by mouth 2 (two) times daily.    [provider]  Apoaequorin (PREVAGEN PO) Take 1 capsule by mouth daily. Extra Strength    [provider]  Ascorbic Acid  (VITAMIN C ) 500 MG CAPS Take 500 mg by mouth daily.    [provider]   aspirin  (ASPIRIN  CHILDRENS) 81 MG chewable tablet Chew 1 tablet (81 mg total) by mouth daily. 11/09/22   Bergamo Gordy, MD  carvedilol  (COREG ) 6.25 MG tablet Take 1 tablet (6.25 mg total) by mouth 2 (two) times daily with a meal. 02/02/23 01/28/24  Bergamo Gordy, MD  Cholecalciferol (VITAMIN D) 2000 units CAPS Take 2,000 Units by mouth daily.    [provider]  CINNAMON PO Take 500 mg by mouth 2 (two) times daily.    [provider]  DULoxetine  (CYMBALTA ) 30 MG capsule Take 30 mg by mouth at bedtime.    [provider]  furosemide  (LASIX ) 40 MG tablet Take 1 tablet (40 mg total) by mouth daily. 12/06/22   Rai, Ripudeep K, MD  insulin  NPH (HUMULIN N,NOVOLIN N) 100 UNIT/ML injection Inject 5-25 Units into the skin See admin instructions. Inject 5 units into the skin before breakfast and 25 units before supper    [provider]  insulin  regular (NOVOLIN R,HUMULIN R ) 100 units/mL injection Inject 10-15 Units into the skin See admin instructions. Inject 10  units into the skin before breakfast and 15 units before supper    [provider]  JARDIANCE  10 MG TABS tablet Take 1 tablet (10 mg total) by mouth daily. 02/02/23   Ladona Heinz, MD  Magnesium 500 MG TABS Take 500 mg by mouth daily.    [provider]  MOUNJARO 5 MG/0.5ML Pen Inject 5 mg into the skin once a week.    [provider]  naproxen (NAPROSYN) 500 MG tablet Take 500 mg by mouth 2 (two) times daily. 01/02/23   [provider]  nitroGLYCERIN  (NITROSTAT ) 0.4 MG SL tablet Place 1 tablet (0.4 mg total) under the tongue every 5 (five) minutes as needed for chest pain. 12/06/22 01/13/23  Rai, Ripudeep K, MD  REPATHA SURECLICK 140 MG/ML SOAJ Inject 140 mg as directed every 14 (fourteen) days. 05/31/22   [provider]  sacubitril -valsartan  (ENTRESTO ) 24-26 MG Take 1 tablet by mouth 2 (two) times daily. 02/02/23   Ladona Heinz, MD  traMADol  (ULTRAM ) 50 MG tablet Take 1 tablet (50 mg  total) by mouth every 6 (six) hours as needed for moderate pain. Patient taking differently: Take 100 mg by mouth every 6 (six) hours as needed for moderate pain (pain score 4-6). 01/07/22   Shuford, Randine, PA-C  TURMERIC PO Take 1,500 mg by mouth daily.    [provider]     Objective    Physical Exam: Vitals:   10/02/23 0130 10/02/23 0215 10/02/23 0245 10/02/23 0345  BP: 129/82 124/85 118/72 138/63  Pulse: 87 82 82 85  Resp: 11 17 17 14   Temp:      TempSrc:      SpO2: 100% 98% 97% 100%  Weight:      Height:        General: appears to be stated age; alert, oriented Skin: warm, dry, no rash Head:  AT/[place] Mouth:  Oral mucosa membranes appear moist, normal dentition Neck: supple; trachea midline Heart:  RRR; did not appreciate any M/R/G Lungs: CTAB, did not appreciate any wheezes, rales, or rhonchi Abdomen: + BS; soft, ND, NT Vascular: 2+ pedal pulses b/l; 2+ radial pulses b/l Extremities: no peripheral edema, no muscle wasting Neuro: strength and sensation intact in upper and lower extremities b/l    Labs on Admission: I have personally reviewed following labs and imaging studies  CBC: Recent Labs  Lab 09/26/23 1240 10/01/23 1945 10/01/23 1946  WBC 6.7 10.7*  --   NEUTROABS 3.4 5.6  --   HGB 18.6* 18.9* 19.4*  HCT 55.1* 55.8* 57.0*  MCV 90.3 91.9  --   PLT 181 253  --    Basic Metabolic Panel: Recent Labs  Lab 09/26/23 1240 10/01/23 1945 10/01/23 1946  NA 145 139 140  K 4.4 4.0 4.1  CL 107 106 107  CO2 32 22  --   GLUCOSE 117* 185* 179*  BUN 17 25* 28*  CREATININE 0.88 1.01 1.10  CALCIUM  9.6 9.3  --    GFR: Estimated Creatinine Clearance: 60.8 mL/min (by C-G formula based on SCr of 1.1 mg/dL). Liver Function Tests: Recent Labs  Lab 09/26/23 1240 10/01/23 1945  AST 14* 19  ALT 7 14  ALKPHOS 77 77  BILITOT 0.6 1.2  PROT 6.7 7.2  ALBUMIN 4.0 3.7   No results for input(s): LIPASE, AMYLASE in the last 168 hours. No results for  input(s): AMMONIA in the last 168 hours. Coagulation Profile: Recent Labs  Lab 10/01/23 1945  INR 1.0   Cardiac  Enzymes: No results for input(s): CKTOTAL, CKMB, CKMBINDEX, TROPONINI in the last 168 hours. BNP (last 3 results) No results for input(s): PROBNP in the last 8760 hours. HbA1C: No results for input(s): HGBA1C in the last 72 hours. CBG: No results for input(s): GLUCAP in the last 168 hours. Lipid Profile: No results for input(s): CHOL, HDL, LDLCALC, TRIG, CHOLHDL, LDLDIRECT in the last 72 hours. Thyroid  Function Tests: No results for input(s): TSH, T4TOTAL, FREET4, T3FREE, THYROIDAB in the last 72 hours. Anemia Panel: No results for input(s): VITAMINB12, FOLATE, FERRITIN, TIBC, IRON, RETICCTPCT in the last 72 hours. Urine analysis:    Component Value Date/Time   COLORURINE COLORLESS (A) 12/04/2022 2018   APPEARANCEUR CLEAR 12/04/2022 2018   LABSPEC 1.005 12/04/2022 2018   PHURINE 5.0 12/04/2022 2018   GLUCOSEU >=500 (A) 12/04/2022 2018   HGBUR NEGATIVE 12/04/2022 2018   BILIRUBINUR NEGATIVE 12/04/2022 2018   KETONESUR NEGATIVE 12/04/2022 2018   PROTEINUR NEGATIVE 12/04/2022 2018   UROBILINOGEN 0.2 10/31/2013 1323   NITRITE NEGATIVE 12/04/2022 2018   LEUKOCYTESUR NEGATIVE 12/04/2022 2018    Radiological Exams on Admission: MR BRAIN WO CONTRAST Result Date: 10/02/2023 EXAM: MRI BRAIN WITHOUT CONTRAST 10/02/2023 04:21:56 AM TECHNIQUE: Multiplanar multisequence MRI of the head/brain was performed without the administration of intravenous contrast. COMPARISON: CT angio head and neck 10/01/2023. MR head without contrast 08/22/2022. Minimal T2 signal adjacent to the hemorrhage is new since the prior study. CLINICAL HISTORY: Transient ischemic attack (TIA). FINDINGS: BRAIN AND VENTRICLES: No acute infarct. No intracranial hemorrhage. No mass. No midline shift. No hydrocephalus. The sella is unremarkable. Normal flow voids. A  new focal area of susceptibility is present in the lateral aspect of the left thalamus. Periventricular and subcortical T2 hyperintensities are mildly advanced for age, similar to the prior exam. Remote lacunar infarcts of the cerebellum are stable. ORBITS: Bilateral lens replacements are noted. The globes and orbits are otherwise within normal limits. SINUSES AND MASTOIDS: No acute abnormality. BONES AND SOFT TISSUES: Normal marrow signal. No acute soft tissue abnormality. IMPRESSION: 1. New focal area of susceptibility in the lateral aspect of the left thalamus is compatible with and interval punctate hemorrhage. This may be acute and related to the patient's symptoms . 2. Mildly advanced periventricular and subcortical T2 hyperintensities, similar to the prior exam. 3. Stable remote lacunar infarcts of the cerebellum. Electronically signed by: Lonni Necessary MD 10/02/2023 04:38 AM EDT RP Workstation: [id number]   CT ANGIO HEAD NECK W WO CM Result Date: 10/01/2023 CLINICAL DATA:  Transient ischemic attack (TIA). EXAM: CT ANGIOGRAPHY HEAD AND NECK WITH AND WITHOUT CONTRAST TECHNIQUE: Multidetector CT imaging of the head and neck was performed using the standard protocol during bolus administration of intravenous contrast. Multiplanar CT image reconstructions and MIPs were obtained to evaluate the vascular anatomy. Carotid stenosis measurements (when applicable) are obtained utilizing NASCET criteria, using the distal internal carotid diameter as the denominator. RADIATION DOSE REDUCTION: This exam was performed according to the departmental dose-optimization program which includes automated exposure control, adjustment of the mA and/or kV according to patient size and/or use of iterative reconstruction technique. CONTRAST:  75mL OMNIPAQUE  IOHEXOL  350 MG/ML SOLN COMPARISON:  None Available. FINDINGS: CT HEAD FINDINGS Brain: No evidence of acute infarction, hemorrhage, hydrocephalus, extra-axial collection or  mass lesion/mass effect. Vascular: No hyperdense vessel or unexpected calcification. Skull: Normal. Negative for fracture or focal lesion. Sinuses/Orbits: No acute finding. Review of the MIP images confirms the above findings CTA NECK FINDINGS Aortic arch: Great vessel origins are patent  without significant stenosis. Right carotid system: No evidence of dissection, stenosis (50% or greater), or occlusion. Left carotid system: No evidence of dissection, stenosis (50% or greater), or occlusion. Vertebral arteries: Right-dominant. No evidence of dissection, stenosis (50% or greater), or occlusion. Skeleton: No acute fracture. Other neck: No acute abnormality on limited assessment. Upper chest: Visualized lung apices are clear. Review of the MIP images confirms the above findings CTA HEAD FINDINGS Anterior circulation: Hypoplastic right A1 ACA. Otherwise, bilateral intracranial ICAs, MCAs, and ACAs are patent without proximal hemodynamically significant stenosis. Posterior circulation: Bilateral intradural vertebral arteries, basilar artery and bilateral posterior cerebral arteries are patent without proximal hemodynamically significant stenosis. Venous sinuses: As permitted by contrast timing, patent. Review of the MIP images confirms the above findings IMPRESSION: No large vessel occlusion or proximal hemodynamically significant stenosis. Electronically Signed   By: Gilmore GORMAN Molt M.D.   On: 10/01/2023 22:36      Assessment/Plan   Principal Problem:   Expressive aphasia Active Problems:   Essential hypertension   DM2 (diabetes mellitus, type 2) (HCC)   Acute ischemic stroke (HCC)    #) Expressive aphasia: New onset expressive aphasia with abrupt onset at 1800 on 10/01/2023, with ensuing spontaneous resolution, without any residual or additional acute focal neurologic deficits noted at this time.  CT head and CTA head and neck showed no evidence of acute process/large vessel occlusion, respectively.   MRI brain showed new area of focal susceptibility in the lateral aspect of the left thalamus patible with interval punctate hemorrhage.  Dr. Voncile of neurology is formally consulting and conveys that presentation/MRI findings are suggestive of acute thalamic stroke with hemorrhagic transformation versus small punctate intracranial hemorrhage. He will place orders via the focused ICH order set, and conveys that we should refrain from antiplatelet and anticoagulant intervention.  Will follow for additional recommendations, including recommendations regarding blood pressure control.   Will further assess for modified ischemic CVA risk factors, as below.  Plan: Neurology to formally consult, stroke team to follow.  Per my initial discussions with Dr. Hart, he will place orders for the intracranial hemorrhage focus order stat, as above.  Will follow for additional recommendations regarding antihypertensive control.  However, given the hemorrhagic transformation versus small punctate intracranial hemorrhage, will refrain from permissive hypertension at this time.  Monitor on telemetry.  Will refrain from antiplatelet and anticoagulant intervention, as above.  Have added 30 degrees.  Monitor strict I's and O's and daily weights.  Check other panel, hemoglobin A1c.  PT/OT/ST consult for the morning.  Endocrine ordered for the morning.  Every 4 hours neurochecks, as above.  Repeat CT head, as ordered by neurology, as above.  Monitor on symmetry.  Holding home and daily baby aspirin  for now, as above.                    #) Type 2 Diabetes Mellitus: documented history of such. Home insulin  regimen: NPH 5 units every morning, 25 units subcu nightly.  As well as regular insulin .  He is also on Jardiance , Mounjaro as an outpatient..  Presenting blood sugar 185.  Most recent hemoglobin A1c was 6.8% in September 2024.  Will recheck hemoglobin A1c at this time as a component of evaluation for modifiable  ischemic CVA risk factors, as above.  Will resume a portion of his basal insulin  for now, as further quantified below.  Plan: accuchecks QAC and HS with low dose SSI.  Lantus  8 units SQ nightly, as above.  Add  on hemoglobin A1c level.  Hold home Mounjaro and Jardiance  for now.                    #) Essential Hypertension: documented h/o such, with outpatient antihypertensive regimen including Coreg , Lasix .  SBP's in the ED today: Low 100s 140s mmHg. will differential includes acute thalamic stroke with hemorrhagic conversion, differential also includes small punctate intracranial hemorrhage.  Consequently, will refrain from typical permissive hypertension for now.  Per initial discussions with neurology, goal systolic blood pressure 130 to 150 mmHg.  Will follow for additional recommendations regarding antihypertensive control.  Plan: Close monitoring of subsequent BP via routine VS. for now, hold home Coreg  and Lasix .  Will follow-up for formal recommendations from neurology regarding goal blood pressure recommendations.  Follow-up for result of repeat CT head later this morning, as ordered by neurology.          DVT prophylaxis: SCD's   Code Status: Full code Family Communication: none Disposition Plan: Per Rounding Team Consults called: I discussed case, preliminarily, with on-call neurology, Dr. Voncile as further detailed above, ;  Admission status: Observation     I SPENT GREATER THAN 75  MINUTES IN CLINICAL CARE TIME/MEDICAL DECISION-MAKING IN COMPLETING THIS ADMISSION.      Eva NOVAK Shondale Quinley DO [facility]  From 7PM - 7AM   10/02/2023, 5:33 AM

## 2023-10-02 NOTE — ED Notes (Signed)
 Pt found at edge of bed stating I have already tried to leave twice and no one will take this IV out of my arm. Pt expressed wanting to leave due to feeling fine. Neurologist at bedside.

## 2023-10-02 NOTE — Progress Notes (Addendum)
 DISCHARGE NOTE HOME MAISEN KLINGLER to be discharged Home per MD order. Discussed prescriptions and follow up appointments with the patient. Prescriptions given to patient; medication list explained in detail. Patient verbalized understanding.  Verified with patient and wife to hold Asprin until follow up with PCP and to start Plavix  7/29  Skin clean, dry and intact without evidence of skin break down, no evidence of skin tears noted. IV catheter discontinued intact. Site without signs and symptoms of complications. Dressing and pressure applied. Pt denies pain at the site currently. No complaints noted.  Patient free of lines, drains, and wounds.   An After Visit Summary (AVS) was printed and given to the patient. Patient escorted via wheelchair, and discharged home via private auto.  Peyton SHAUNNA Pepper, RN

## 2023-10-02 NOTE — Progress Notes (Signed)
 Physical Therapy Brief Evaluation and Discharge Note Patient Details Name: Alan Wang MRN: [id number] DOB: August 22, 1950 Today's Date: 10/02/2023   History of Present Illness  73 y.o. male presented to the emergency department 7/26, after he had a 15-minute episode of slurred speech and speaking gibberish while at dinner with family. MRI - Nontraumatic punctate left thalamic hemorrhage. PMH: diabetes, hypertension, hyperlipidemia, prior TIAs with no residual deficits, coronary artery disease, polycythemia vera.   Clinical Impression  Patient evaluated by Physical Therapy with no further acute PT needs identified. All education has been completed and the patient has no further questions. Feels back to baseline with functional tasks assessed today. States he has fallen in the past 6 months. Demonstrates increased fall risk based on DGI (17/24). Able to transfer and ambulate today without physical assistance. VSS. Recommend SPC/Quad cane use at home which he states he already has, and OPPT follow-up for balance training. PT is signing off. Thank you for this referral.        PT Assessment All further PT needs can be met in the next venue of care  Assistance Needed at Discharge  PRN    Equipment Recommendations None recommended by PT  Recommendations for Other Services       Precautions/Restrictions Precautions Precautions: Fall Recall of Precautions/Restrictions: Intact Restrictions Weight Bearing Restrictions Per Provider Order: No        Mobility  Bed Mobility   Supine/Sidelying to sit: Modified independent (Device/Increased time) Sit to supine/sidelying: Modified independent (Device/Increased time) General bed mobility comments: extra time, no assist  Transfers Overall transfer level: Modified independent Equipment used: None               General transfer comment: No AD required, minimal sway upon standing.    Ambulation/Gait Ambulation/Gait assistance:  Supervision Gait Distance (Feet): 110 Feet Assistive device: None Gait Pattern/deviations: Step-through pattern, Drifts right/left, Staggering right, Decreased stride length Gait Speed: Below normal General Gait Details: Grossly stable without AD when walking forward un-challenged. With higher level dynamic tasks but with some deviations of gait from straight path and one episode of LOB but able to self correct himself. Supervision for safety during DGI testing, otherwise mod I.  Home Activity Instructions Home Activity Instructions: Suggested SPC use and follow-up with OPPT for balance.  Stairs            Modified Rankin (Stroke Patients Only) Modified Rankin (Stroke Patients Only) Pre-Morbid Rankin Score: No significant disability Modified Rankin: Slight disability      Balance Overall balance assessment: Modified Independent               Standardized Balance Assessment Standardized Balance Assessment : Dynamic Gait Index Dynamic Gait Index Level Surface: Normal Change in Gait Speed: Normal Gait with Horizontal Head Turns: Normal Gait with Vertical Head Turns: Mild Impairment Gait and Pivot Turn: Severe Impairment Step Over Obstacle: Moderate Impairment Step Around Obstacles: Normal Steps: Mild Impairment Total Score: 17      Pertinent Vitals/Pain PT - Brief Vital Signs All Vital Signs Stable: Yes Pain Assessment Pain Assessment: No/denies pain     Home Living Family/patient expects to be discharged to:: Private residence Living Arrangements: Spouse/significant other Available Help at Discharge: Family;Available 24 hours/day Home Environment: Stairs to enter;Stairs in home;Rail - right;Rail - left  Stairs-Number of Steps: 6 (outside 6 (inside has stair lift)) Home Equipment: Rollator (4 wheels);Cane - quad;Cane - single point;Shower seat - built in;Grab bars - tub/shower;Hand held shower head;Lift chair   Additional  Comments: Handicap accessible  shower/tub    Prior Function Level of Independence: Independent Comments: Denies using AD, but states he has had falls in the past 6 months.    UE/LE Assessment        LE ROM/Strength/Tone/Coordination: WFL (HKS wfl (limited range))      Communication   Communication Communication: No apparent difficulties     Cognition Overall Cognitive Status: Appears within functional limits for tasks assessed/performed       General Comments General comments (skin integrity, edema, etc.): Educated on safety, awareness. Pt feels back to baseline with functional tasks today    Exercises     Assessment/Plan    PT Problem List Decreased balance;Decreased mobility       PT Visit Diagnosis Other abnormalities of gait and mobility (R26.89);History of falling (Z91.81);Other symptoms and signs involving the nervous system (R29.898)    No Skilled PT     Co-evaluation                AMPAC 6 Clicks Help needed turning from your back to your side while in a flat bed without using bedrails?: None Help needed moving from lying on your back to sitting on the side of a flat bed without using bedrails?: None Help needed moving to and from a bed to a chair (including a wheelchair)?: None Help needed standing up from a chair using your arms (e.g., wheelchair or bedside chair)?: None Help needed to walk in hospital room?: None Help needed climbing 3-5 steps with a railing? : A Little 6 Click Score: 23      End of Session   Activity Tolerance: Patient tolerated treatment well Patient left: with call bell/phone within reach (Reclined on stretcher)   PT Visit Diagnosis: Other abnormalities of gait and mobility (R26.89);History of falling (Z91.81);Other symptoms and signs involving the nervous system (R29.898)     Time: 9247-9175 PT Time Calculation (min) (ACUTE ONLY): 32 min  Charges:   PT Evaluation $PT Eval Low Complexity: 1 Low PT Treatments $Gait Training: 8-22 mins     Leontine Roads, PT, DPT [facility] Physical Therapist Office: [phone] Website: [email or web address]   Leontine GORMAN Roads  10/02/2023, 9:27 AM

## 2023-10-02 NOTE — Plan of Care (Signed)
  Problem: Education: Goal: Knowledge of disease or condition will improve Outcome: Progressing Goal: Knowledge of secondary prevention will improve (MUST DOCUMENT ALL) Outcome: Progressing Goal: Knowledge of patient specific risk factors will improve (DELETE if not current risk factor) Outcome: Progressing   Problem: Coping: Goal: Will verbalize positive feelings about self Outcome: Progressing   Problem: Health Behavior/Discharge Planning: Goal: Goals will be collaboratively established with patient/family Outcome: Progressing

## 2023-10-10 DIAGNOSIS — M150 Primary generalized (osteo)arthritis: Secondary | ICD-10-CM | POA: Diagnosis not present

## 2023-10-10 DIAGNOSIS — I10 Essential (primary) hypertension: Secondary | ICD-10-CM | POA: Diagnosis not present

## 2023-10-10 DIAGNOSIS — E119 Type 2 diabetes mellitus without complications: Secondary | ICD-10-CM | POA: Diagnosis not present

## 2023-10-10 DIAGNOSIS — E785 Hyperlipidemia, unspecified: Secondary | ICD-10-CM | POA: Diagnosis not present

## 2023-10-10 DIAGNOSIS — I2510 Atherosclerotic heart disease of native coronary artery without angina pectoris: Secondary | ICD-10-CM | POA: Diagnosis not present

## 2023-10-10 DIAGNOSIS — I6389 Other cerebral infarction: Secondary | ICD-10-CM | POA: Diagnosis not present

## 2023-10-10 DIAGNOSIS — Z6827 Body mass index (BMI) 27.0-27.9, adult: Secondary | ICD-10-CM | POA: Diagnosis not present

## 2023-10-11 ENCOUNTER — Inpatient Hospital Stay: Attending: Hematology and Oncology | Admitting: Hematology and Oncology

## 2023-10-11 ENCOUNTER — Inpatient Hospital Stay

## 2023-10-11 ENCOUNTER — Other Ambulatory Visit: Payer: Self-pay | Admitting: Hematology and Oncology

## 2023-10-11 ENCOUNTER — Encounter: Payer: Self-pay | Admitting: Hematology and Oncology

## 2023-10-11 VITALS — BP 117/93 | HR 92 | Resp 18

## 2023-10-11 VITALS — BP 108/69 | HR 80 | Temp 97.8°F | Resp 16 | Ht 70.0 in | Wt 184.0 lb

## 2023-10-11 DIAGNOSIS — D751 Secondary polycythemia: Secondary | ICD-10-CM | POA: Insufficient documentation

## 2023-10-11 DIAGNOSIS — D45 Polycythemia vera: Secondary | ICD-10-CM

## 2023-10-11 DIAGNOSIS — Z8673 Personal history of transient ischemic attack (TIA), and cerebral infarction without residual deficits: Secondary | ICD-10-CM | POA: Diagnosis not present

## 2023-10-11 LAB — CBC WITH DIFFERENTIAL/PLATELET
Abs Immature Granulocytes: 0.04 K/uL (ref 0.00–0.07)
Basophils Absolute: 0 K/uL (ref 0.0–0.1)
Basophils Relative: 1 %
Eosinophils Absolute: 0.1 K/uL (ref 0.0–0.5)
Eosinophils Relative: 1 %
HCT: 56.6 % — ABNORMAL HIGH (ref 39.0–52.0)
Hemoglobin: 19.1 g/dL — ABNORMAL HIGH (ref 13.0–17.0)
Immature Granulocytes: 1 %
Lymphocytes Relative: 33 %
Lymphs Abs: 2.5 K/uL (ref 0.7–4.0)
MCH: 30.5 pg (ref 26.0–34.0)
MCHC: 33.7 g/dL (ref 30.0–36.0)
MCV: 90.4 fL (ref 80.0–100.0)
Monocytes Absolute: 0.9 K/uL (ref 0.1–1.0)
Monocytes Relative: 12 %
Neutro Abs: 4 K/uL (ref 1.7–7.7)
Neutrophils Relative %: 52 %
Platelets: 189 K/uL (ref 150–400)
RBC: 6.26 MIL/uL — ABNORMAL HIGH (ref 4.22–5.81)
RDW: 13.9 % (ref 11.5–15.5)
WBC: 7.5 K/uL (ref 4.0–10.5)
nRBC: 0 % (ref 0.0–0.2)

## 2023-10-11 NOTE — Assessment & Plan Note (Signed)
 The patient has been donating blood up until several years ago Repeat labs 2 weeks ago excluded myeloproliferative disorder JAK2 mutation, CAL R and MPL were all negative  I suspect he might have undiagnosed obstructive sleep apnea causing secondary polycythemia vera I recommend regular phlebotomy every 2 months to keep hemoglobin less than 17 We discussed importance of oral hydration and for him to continue antiplatelet agents due to recent stroke I will see him again in 6 months for further follow-up

## 2023-10-11 NOTE — Progress Notes (Signed)
 Alan Wang presents today for phlebotomy per MD orders. Phlebotomy procedure started at 1459 and ended at 1518. 18G IV kit used in L forearm.  508 grams removed. Patient observed for 30 minutes after procedure without any incident. Patient tolerated procedure well. IV needle removed intact.

## 2023-10-11 NOTE — Progress Notes (Signed)
 [facility] OFFICE PROGRESS NOTE  Patient Care Team: Frederik Charleston, MD as PCP - General (Family Medicine) Ladona Heinz, MD as PCP - Cardiology (Cardiology) Dolphus Reiter, MD as Consulting Physician (Rheumatology)  Assessment & Plan Secondary erythrocytosis The patient has been donating blood up until several years ago Repeat labs 2 weeks ago excluded myeloproliferative disorder JAK2 mutation, CAL R and MPL were all negative  I suspect he might have undiagnosed obstructive sleep apnea causing secondary polycythemia vera I recommend regular phlebotomy every 2 months to keep hemoglobin less than 17 We discussed importance of oral hydration and for him to continue antiplatelet agents due to recent stroke I will see him again in 6 months for further follow-up  No orders of the defined types were placed in this encounter.    Almarie Bedford, MD  INTERVAL HISTORY: he returns for surveillance follow-up on recent test results The patient was recently admitted to the hospital for stroke He has completely recovered  PHYSICAL EXAMINATION: ECOG PERFORMANCE STATUS: 1 - Symptomatic but completely ambulatory  Vitals:   10/11/23 1253  BP: 108/69  Pulse: 80  Resp: 16  Temp: 97.8 F (36.6 C)  SpO2: 97%   Filed Weights   10/11/23 1253  Weight: 184 lb (83.5 kg)    Relevant data reviewed during this visit included CBC, CMP, next generation sequencing results, erythropoietin  level

## 2023-10-11 NOTE — Patient Instructions (Signed)

## 2023-10-28 NOTE — Therapy (Signed)
 OUTPATIENT PHYSICAL THERAPY NEURO EVALUATION   Patient Name: Alan Wang MRN: [id number] DOB:1950-11-17, 73 y.o., male Today's Date: 10/31/2023   PCP: Frederik Charleston, MD  REFERRING PROVIDER: Rosario Leatrice FERNS, MD  END OF SESSION:  PT End of Session - 10/31/23 1526     Visit Number 1    Number of Visits 13    Date for PT Re-Evaluation 12/26/23    Authorization Type [employer]    PT Start Time 1449    PT Stop Time 1525    PT Time Calculation (min) 36 min    Equipment Utilized During Treatment Gait belt    Activity Tolerance Patient tolerated treatment well    Behavior During Therapy WFL for tasks assessed/performed          Past Medical History:  Diagnosis Date   Arthritis    Asthma    Cancer (HCC)    skin   Coronary artery disease    COVID    Diabetes mellitus    type 2, insulin  dependent for 30+ years   History of kidney stones 11/2012   Hyperlipemia    Hypertension    IDDM (insulin  dependent diabetes mellitus)    Shortness of breath    Stroke (HCC)    hx TIA   Past Surgical History:  Procedure Laterality Date   ANGIOPLASTY  04/06/2011   x3    CARDIAC CATHETERIZATION     CARDIAC CATHETERIZATION N/A 09/16/2015   Procedure: Left Heart Cath and Coronary Angiography;  Surgeon: Gordy Bergamo, MD;  Location: [facility] INVASIVE CV LAB;  Service: Cardiovascular;  Laterality: N/A;   cataracts     CHOLECYSTECTOMY     HERNIA REPAIR     x2   KNEE ARTHROSCOPY     LEFT HEART CATH AND CORONARY ANGIOGRAPHY N/A 01/19/2023   Procedure: LEFT HEART CATH AND CORONARY ANGIOGRAPHY;  Surgeon: Verlin Lonni JONETTA, MD;  Location: [facility] INVASIVE CV LAB;  Service: Cardiovascular;  Laterality: N/A;   LEFT HEART CATHETERIZATION WITH CORONARY ANGIOGRAM N/A 03/08/2011   Procedure: LEFT HEART CATHETERIZATION WITH CORONARY ANGIOGRAM;  Surgeon: Erick JONELLE Bergamo, MD;  Location: [facility] CATH LAB;  Service: Cardiovascular;  Laterality: N/A;   PERCUTANEOUS CORONARY STENT INTERVENTION (PCI-S) N/A  04/06/2011   Procedure: PERCUTANEOUS CORONARY STENT INTERVENTION (PCI-S);  Surgeon: Erick JONELLE Bergamo, MD;  Location: [facility] CATH LAB;  Service: Cardiovascular;  Laterality: N/A;   TOTAL SHOULDER ARTHROPLASTY Right 01/07/2022   Procedure: TOTAL SHOULDER ARTHROPLASTY;  Surgeon: Melita Drivers, MD;  Location: [facility] ORS;  Service: Orthopedics;  Laterality: Right;    Patient Active Problem List   Diagnosis Date Noted   Secondary erythrocytosis 10/11/2023   Acute ischemic stroke (HCC) 10/02/2023   Expressive aphasia 10/02/2023   Polycythemia vera (HCC) 09/26/2023   Malnutrition of moderate degree 12/06/2022   Acute on chronic systolic CHF (congestive heart failure) (HCC) 12/04/2022   Elevated troponin 12/04/2022   Acute metabolic encephalopathy 08/22/2022   Acute on chronic diastolic CHF (congestive heart failure) (HCC) 08/22/2022   Demand ischemia (HCC) 08/22/2022   COVID-19 08/21/2022   TIA (transient ischemic attack) 06/22/2016   DM2 (diabetes mellitus, type 2) (HCC) 06/22/2016   Primary osteoarthritis of both hands 06/03/2016   Primary osteoarthritis of both knees 06/03/2016   Generalized pain 06/03/2016   Memory difficulties 06/03/2016   History of diabetes mellitus 06/03/2016   History of coronary artery disease 06/03/2016   History of hypertension 06/03/2016   History of hypercholesterolemia 06/03/2016   High risk medication use 06/03/2016  History of high cholesterol 06/03/2016   ANA positive 06/03/2016   Inflammatory arthritis 10/29/2015   Vitamin D deficiency 10/29/2015   Fatigue 09/24/2015   Vertigo 10/31/2013   Chronic cough 11/16/2012   CAD (coronary artery disease), native coronary artery 03/08/2011   Postsurgical percutaneous transluminal coronary angioplasty (PTCA) status 03/08/2011   Insulin  dependent type 2 diabetes mellitus (HCC) 03/08/2011   Essential hypertension 03/08/2011   Shortness of breath 03/08/2011   Mixed hyperlipidemia 03/08/2011    ONSET DATE:  10/01/23  REFERRING DIAG: R53.1 (ICD-10-CM) - Weakness  THERAPY DIAG:  Muscle weakness (generalized)  Unsteadiness on feet  Rationale for Evaluation and Treatment: Rehabilitation  SUBJECTIVE:                                                                                                                                                                                             SUBJECTIVE STATEMENT: Patient reports that he had a stroke which caused difficulty speaking but this has resolved. Reports that he suffered no physical effects from it. Reports that he has also had 2 mini strokes in the past. Reports that he doesn't get much exercise and knows that isn't good. Reports that he continues to have pain in the R shoulder since getting a TSA. Reports that he gets momentary dizzy spells when standing up from a chair. Reports 1 bad fall in January. Avoids stairs and wife got him a stair lift.    Pt accompanied by: significant other  PERTINENT HISTORY: Asthma, CAD, DMII, HLD, HTN, SOB, R TSA 2023  PAIN:  Are you having pain? Pt reports chronic R shoulder pain  PRECAUTIONS: Other: R shoulder pain limiting activity   RED FLAGS: None   WEIGHT BEARING RESTRICTIONS: No  FALLS: Has patient fallen in last 6 months? No  LIVING ENVIRONMENT: Lives with: lives with their spouse Lives in: House/apartment Stairs: 3 +1 steps, 2 story home; has stair lift which pt reports awkward/dizzy going up stairs without it Has following equipment at home: Single point cane, Walker - 2 wheeled, Environmental consultant - 4 wheeled, shower chair, and Grab bars  PLOF: Independent with basic ADLs and reports difficulty bathing/dressing d/t R shoulder pain  PATIENT GOALS: pt does not report  OBJECTIVE:  Note: Objective measures were completed at Evaluation unless otherwise noted.  DIAGNOSTIC FINDINGS: 10/02/23 brain MRI: New focal area of susceptibility in the lateral aspect of the left thalamus is compatible with and  interval punctate hemorrhage. This may be acute and related to the patient's symptoms . 2. Mildly advanced periventricular and subcortical T2 hyperintensities, similar to the prior exam. 3. Stable remote lacunar infarcts of  the cerebellum.   COGNITION: Overall cognitive status: Within functional limits for tasks assessed   SENSATION: Pt denies N/T in UEs/LEs   COORDINATION: Alternating pronation/supination: WNL B Alternating toe tap: WNL B Finger to nose: slowed B   MUSCLE TONE: WNL B LEs   POSTURE: rounded shoulders and forward head, flexed trunk and frequently looking down   LOWER EXTREMITY ROM:     Active  Right Eval Left Eval  Hip flexion    Hip extension    Hip abduction    Hip adduction    Hip internal rotation    Hip external rotation    Knee flexion    Knee extension    Ankle dorsiflexion 4 deg with knee bent -3 with bent knee  Ankle plantarflexion    Ankle inversion    Ankle eversion     (Blank rows = not tested)  LOWER EXTREMITY MMT:    MMT Right Eval Left Eval  Hip flexion 3+ 3+  Hip extension    Hip abduction 4 4-  Hip adduction 4+ 4+  Hip internal rotation    Hip external rotation    Knee flexion 3+ 3+  Knee extension 4- 4  Ankle dorsiflexion 4 4  Ankle plantarflexion 4 4  Ankle inversion    Ankle eversion    (Blank rows = not tested)  GAIT: Findings: Assistive device utilized:None, Level of assistance: Modified independence, and Comments: slowed speed  FUNCTIONAL TESTS:  5 times sit to stand: 18.16 sec without UEs  Timed up and go (TUG): 11.9 sec  Dynamic Gait Index: 19/24   [facility] PT Assessment - 10/31/23 0001       Standardized Balance Assessment   Standardized Balance Assessment Dynamic Gait Index      Dynamic Gait Index   Level Surface Mild Impairment    Change in Gait Speed Mild Impairment    Gait with Horizontal Head Turns Normal    Gait with Vertical Head Turns Mild Impairment    Gait and Pivot Turn Normal    Step  Over Obstacle Normal    Step Around Obstacles Mild Impairment    Steps Mild Impairment    Total Score 19                                                                                                                                       TREATMENT DATE: 10/31/23     PATIENT EDUCATION: Education details: prognosis, POC, discussed goals to work towards fitness regimen of some kind post-DC Person educated: Patient and Spouse Education method: Programmer, multimedia, Facilities manager, Actor cues, and Verbal cues Education comprehension: verbalized understanding  HOME EXERCISE PROGRAM: Not yet initiated     GOALS: Goals reviewed with patient? Yes  SHORT TERM GOALS: Target date: 11/28/2023  Patient to be independent with initial HEP. Baseline: HEP initiated Goal status: INITIAL    LONG TERM GOALS: Target date: 12/26/2023  Patient  to be independent with advanced HEP. Baseline: Not yet initiated  Goal status: INITIAL  Patient to demonstrate B LE strength >/=4+/5.  Baseline: See above Goal status: INITIAL  Patient to demonstrate B dorsiflexion AROM to at least 10 degrees to improve efficiency of gait.  Baseline: 4, -3 deg with compensations  Goal status: INITIAL  Patient to score at least 22/24 on DGI in order to decrease risk of falls.  Baseline: 19 Goal status: INITIAL  Patient to demonstrate stair and curb navigation safely without handrail.   Baseline: step-to/through pattern with handrail Goal status: INITIAL  Patient to demonstrate 5xSTS test in <15 sec in order to decrease risk of falls.  Baseline: 18.16 sec Goal status: INITIAL  Patient to report plans to maintain fitness regimen at home or in the community.  Baseline: pt reports sedentary lifestyle currently  Goal status: INITIAL   ASSESSMENT:  CLINICAL IMPRESSION:  Patient is a 73 y/o F presenting to OPPT with c/o limited activity levels s/p hospital admission 10/01/23-10/02/23 for left thalamic punctate  hemorrhage. Patient today presenting with Rounded posture, limited B dorsiflexion AROM, marked LE weakness, increased time to complete transfers, and increased risk of falls according to DGI. Would benefit from skilled PT services 2 x/week for 4 weeks followed by 1x/week for 4 weeks to address aforementioned impairments in order to optimize level of function.    OBJECTIVE IMPAIRMENTS: Abnormal gait, decreased activity tolerance, decreased balance, decreased endurance, decreased knowledge of use of DME, difficulty walking, decreased ROM, decreased strength, impaired flexibility, postural dysfunction, and pain.   ACTIVITY LIMITATIONS: carrying, lifting, bending, sitting, standing, squatting, sleeping, stairs, transfers, bathing, toileting, dressing, reach over head, hygiene/grooming, and locomotion level  PARTICIPATION LIMITATIONS: meal prep, shopping, community activity, and yard work  PERSONAL FACTORS: Age, Past/current experiences, Time since onset of injury/illness/exacerbation, and 3+ comorbidities: Asthma, CAD, DMII, HLD, HTN, SOB, R TSA 2023 are also affecting patient's functional outcome.   REHAB POTENTIAL: Good  CLINICAL DECISION MAKING: Evolving/moderate complexity  EVALUATION COMPLEXITY: Moderate  PLAN:  PT FREQUENCY: 2 x/week for 4 weeks followed by 1x/week for 4 weeks  PT DURATION: -  PLANNED INTERVENTIONS: 02835- PT Re-evaluation, 97110-Therapeutic exercises, 97530- Therapeutic activity, 97112- Neuromuscular re-education, 97535- Self Care, 02859- Manual therapy, [phone]- Gait training, [phone]- Canalith repositioning, [id number]- Electrical stimulation (manual), [id number] (1-2 muscles), 20561 (3+ muscles)- Dry Needling, Patient/Family education, Balance training, Stair training, Taping, Vestibular training, DME instructions, Cryotherapy, and Moist heat  PLAN FOR NEXT SESSION: initiate HEP for LE strengthening; work on stairs without handrail, if pt continues to be dizzy- check orthostatics;  goal is to transition to fitness regimen or group fitness class  Louana Terrilyn Christians, [place], DPT 10/31/23 3:35 PM  [facility] [address] [place], [place] [address] Phone # [phone] Fax # [phone]

## 2023-10-31 ENCOUNTER — Encounter: Payer: Self-pay | Admitting: Physical Therapy

## 2023-10-31 ENCOUNTER — Ambulatory Visit: Attending: Internal Medicine | Admitting: Physical Therapy

## 2023-10-31 ENCOUNTER — Other Ambulatory Visit: Payer: Self-pay

## 2023-10-31 DIAGNOSIS — M6281 Muscle weakness (generalized): Secondary | ICD-10-CM | POA: Diagnosis present

## 2023-10-31 DIAGNOSIS — R2681 Unsteadiness on feet: Secondary | ICD-10-CM | POA: Insufficient documentation

## 2023-11-09 DIAGNOSIS — M25511 Pain in right shoulder: Secondary | ICD-10-CM | POA: Diagnosis not present

## 2023-11-09 NOTE — Progress Notes (Signed)
 Christopher S. Klifto, M.D. Alix Dodie, NP-C [facility]   [facility] VISIT   Reason for Visit:    No chief complaint on file.   History of Present Illness:  History of Present Illness Alan Wang is a 73 year old male who presents with persistent shoulder pain and limited range of motion following shoulder surgery. He underwent anatomic TSA on 01/07/2022 with Dr. Melita.   He has experienced persistent pain since the operation. He is unable to recall the pain level prior to surgery for comparison but notes that the pain has not subsided post-surgery. He can sometimes find a position while lying on his back where the pain is minimal, but it never completely resolves.  He completed physical therapy does not recall going for 3 months but did not achieve significant pain relief or regain full range of motion. He had more movement before the shoulder 'wore out' initially. He has not returned to the original surgeon due to dissatisfaction with the outcome and a disagreement regarding further surgical intervention.  He experiences pain radiating from the shoulder down to his wrist, without numbness or tingling, and reports decreased strength in the affected arm. He struggles with daily activities such as washing his hair and dressing; holding a coffee pot.  He is currently managing the pain with Tylenol . Previous medications like oxycodone , Oxycontin , and tramadol  were used but are not currently being taken.  He has a history of diabetes, managed with medications including Jardiance , and has lost 50-60 pounds intentionally over the last six to eight months. He also has a history of cardiovascular issues, including four stents and a recent minor stroke approximately four weeks ago, which resulted in temporary speech difficulties but no permanent damage. He is on Plavix  for blood thinning and follows up with a cardiologist annually.  He lives outside of  [place], approximately twenty miles [place].  Answers submitted by the patient for this visit: Right Shoulder HPI Questionnaire (Submitted on 11/09/2023) Chief Complaint: HPI - Right Shoulder How did your symptoms begin?: gradually without injury How long ago did your symptoms begin?: 4 Years Please choose the severity that best describes your pain: marked (major pain with significant limitations) Rate the pain on a scale of 0 (no pain) to 10 (worst pain ever): 6/10 Does your shoulder pain prevent you from performing your hobbies?: Yes Are you able to perform sports at the same level before the injury?: No Do you lift a lot of weight with your shoulders?: No Does your shoulder feel stable?: No Does your shoulder feel weak (like it may dislocate)?: Yes Do your symptoms wake you from sleep?: Yes Please choose all the items that improve your symptoms: acetaminophen , non-steroidal anti-inflammatories, physical therapy, rest Please choose all the items that worsen your symptoms: activity in general, getting dressed, overhead activity Please indicate any studies you've had for this problem (select all that apply): X-ray(s) Please select all prior treatments you have had for this problem: medications, physical therapy, surgery Questionnaire about: HPI - Right Shoulder (Submitted on 11/09/2023) Chief Complaint: HPI - Right Shoulder   PROCEDURE: Right shoulder anatomic arthroplasty utilizing a Arthrex size 43 trunnion with a large cage screw, 45 x 19 humeral head, medium glenoid    Past Medical History:  No past medical history on file.  Past Surgical History: No past surgical history on file.   Current Medications:  No current outpatient medications on file.   No current facility-administered medications for this visit.  Allergies:  Not on File   Focused Physical Examination: There were no vitals taken for this visit.  Shoulder Examination: Physical Exam   Right Left   Palpation    Tenderness anterior glenohumeral joint line and posterior glenohumeral joint line None  Crepitus None None   ROM  Right Left  Active Forward Elevation 100 175  GH Abduction 50 90  ER 30 60  IR L5 T10  90 degree arc (ER/IR) 90/60 90/60   Inspection  Right Left  Scapular Kinetics Normal Normal  Atrophy None None   Impingement / Rotator Cuff  Right Left  Neer Impingement    Hawkins    Jobe (Empty Can) 3-   ER Strength 4-   Liftoff NT   Belly Press 4-   Hornblower's     Neurologic    Fires PIN, radial, median, ulnar, musculocutaneous, axillary, suprascapular, long thoracic, and spinal accessory innervated muscles. No abnormal sensibility.  Vascular/Lymphatic    Radial Pulse palpable palpable  Allens Not tested Not tested  Adsons Not tested Not tested  Cervical Exam    Patient has full painless cervical range of motion.   Imaging: 11/09/2023: 4-view (AP, grashey, Y-view, axillary) x-ray right shoulder ordered and personally reviewed in clinic today.  They reveal no acute fractures or dislocations. Components consistent with stemless anatomic total shoulder replacement. Evidence of humeral component superior shift and concern for glenoid loosening  Glenohumeral and acromioclavicular joint spaces well-maintained.  Soft tissue unremarkable.  Assessment and Plan:   ICD-10-CM  1. Right shoulder pain, unspecified chronicity  M25.511    I had an extensive discussion with the patient today regarding the nature of right shoulder pain including signs and symptoms of the condition, general considerations for causation and progression of the condition, and strategies for treatment.  Treatment options were discussed with the patient ranging from most conservative to invasive management options. The indications and nature of surgical versus nonsurgical options have been discussed including the rationale for various surgical treatments.  The inherent risks and benefits of each  treatment including non operative management have been reviewed and prognosis for recovery (including recurrence) and typical postoperative protocols have been explained.  The patient verbalizes sound understanding of our discussion.  Assessment & Plan Failed right anatomic total shoulder arthroplasty with persistent pain and limited function Persistent pain and limited function in the right shoulder following anatomic total shoulder arthroplasty performed 01/2022- reports no resolution of symptoms since initial procedure. No improvement in pain or range of motion post-surgery. Pain radiates to the wrist with decreased arm strength.. He is unwilling to undergo another surgery with the previous surgeon and seeks a second opinion. Conversion to a reverse total shoulder replacement is offered. Risks of revision surgery discussed. He desires to proceed with corrective surgery as soon as possible. - Order CT scan to evaluate current hardware and bone loss. - Perform aspiration of shoulder joint to rule out infection. - Conduct blood work for preoperative evaluation. External orders placed. - Coordinate medical clearance, including cardiology clearance due to recent stroke (TIA 4 weeks ago- no current neurology) and stents. On plavix . - Schedule revision surgery for conversion to reverse total shoulder replacement. - Arrange preoperative visit and imaging at [facility]. - Coordinate [facility] pre op, sling, consent, visit with Dr. Klifto.  Patient is in agreement with above-stated treatment plan.  All questions answered.  Patient may call the clinic at any time with further questions or concerns.   Attestation Statement:   I  personally performed the service, non-incident to. (WP)   ALIX KING FINICAL, NP This note has been created using automated tools and reviewed for accuracy by [facility].

## 2023-11-11 DIAGNOSIS — I2510 Atherosclerotic heart disease of native coronary artery without angina pectoris: Secondary | ICD-10-CM | POA: Diagnosis not present

## 2023-11-11 DIAGNOSIS — M150 Primary generalized (osteo)arthritis: Secondary | ICD-10-CM | POA: Diagnosis not present

## 2023-11-11 DIAGNOSIS — E119 Type 2 diabetes mellitus without complications: Secondary | ICD-10-CM | POA: Diagnosis not present

## 2023-11-11 DIAGNOSIS — Z6826 Body mass index (BMI) 26.0-26.9, adult: Secondary | ICD-10-CM | POA: Diagnosis not present

## 2023-11-11 DIAGNOSIS — I10 Essential (primary) hypertension: Secondary | ICD-10-CM | POA: Diagnosis not present

## 2023-11-11 DIAGNOSIS — E785 Hyperlipidemia, unspecified: Secondary | ICD-10-CM | POA: Diagnosis not present

## 2023-11-15 ENCOUNTER — Ambulatory Visit: Admitting: Physical Therapy

## 2023-11-15 NOTE — Therapy (Deleted)
 OUTPATIENT PHYSICAL THERAPY NEURO EVALUATION   Patient Name: Alan Wang MRN: [id number] DOB:12-20-1950, 73 y.o., male Today's Date: 11/15/2023   PCP: Frederik Charleston, MD  REFERRING PROVIDER: Rosario Leatrice FERNS, MD  END OF SESSION:    Past Medical History:  Diagnosis Date   Arthritis    Asthma    Cancer [facility])    skin   Coronary artery disease    COVID    Diabetes mellitus    type 2, insulin  dependent for 30+ years   History of kidney stones 11/2012   Hyperlipemia    Hypertension    IDDM (insulin  dependent diabetes mellitus)    Shortness of breath    Stroke (HCC)    hx TIA   Past Surgical History:  Procedure Laterality Date   ANGIOPLASTY  04/06/2011   x3    CARDIAC CATHETERIZATION     CARDIAC CATHETERIZATION N/A 09/16/2015   Procedure: Left Heart Cath and Coronary Angiography;  Surgeon: Gordy Bergamo, MD;  Location: [facility] INVASIVE CV LAB;  Service: Cardiovascular;  Laterality: N/A;   cataracts     CHOLECYSTECTOMY     HERNIA REPAIR     x2   KNEE ARTHROSCOPY     LEFT HEART CATH AND CORONARY ANGIOGRAPHY N/A 01/19/2023   Procedure: LEFT HEART CATH AND CORONARY ANGIOGRAPHY;  Surgeon: Verlin Lonni JONETTA, MD;  Location: [facility] INVASIVE CV LAB;  Service: Cardiovascular;  Laterality: N/A;   LEFT HEART CATHETERIZATION WITH CORONARY ANGIOGRAM N/A 03/08/2011   Procedure: LEFT HEART CATHETERIZATION WITH CORONARY ANGIOGRAM;  Surgeon: Erick JONELLE Bergamo, MD;  Location: [facility] CATH LAB;  Service: Cardiovascular;  Laterality: N/A;   PERCUTANEOUS CORONARY STENT INTERVENTION (PCI-S) N/A 04/06/2011   Procedure: PERCUTANEOUS CORONARY STENT INTERVENTION (PCI-S);  Surgeon: Erick JONELLE Bergamo, MD;  Location: [facility] CATH LAB;  Service: Cardiovascular;  Laterality: N/A;   TOTAL SHOULDER ARTHROPLASTY Right 01/07/2022   Procedure: TOTAL SHOULDER ARTHROPLASTY;  Surgeon: Melita Drivers, MD;  Location: [facility] ORS;  Service: Orthopedics;  Laterality: Right;    Patient Active Problem List   Diagnosis Date  Noted   Secondary erythrocytosis 10/11/2023   Acute ischemic stroke (HCC) 10/02/2023   Expressive aphasia 10/02/2023   Polycythemia vera (HCC) 09/26/2023   Malnutrition of moderate degree 12/06/2022   Acute on chronic systolic CHF (congestive heart failure) (HCC) 12/04/2022   Elevated troponin 12/04/2022   Acute metabolic encephalopathy 08/22/2022   Acute on chronic diastolic CHF (congestive heart failure) (HCC) 08/22/2022   Demand ischemia (HCC) 08/22/2022   COVID-19 08/21/2022   TIA (transient ischemic attack) 06/22/2016   DM2 (diabetes mellitus, type 2) (HCC) 06/22/2016   Primary osteoarthritis of both hands 06/03/2016   Primary osteoarthritis of both knees 06/03/2016   Generalized pain 06/03/2016   Memory difficulties 06/03/2016   History of diabetes mellitus 06/03/2016   History of coronary artery disease 06/03/2016   History of hypertension 06/03/2016   History of hypercholesterolemia 06/03/2016   High risk medication use 06/03/2016   History of high cholesterol 06/03/2016   ANA positive 06/03/2016   Inflammatory arthritis 10/29/2015   Vitamin D deficiency 10/29/2015   Fatigue 09/24/2015   Vertigo 10/31/2013   Chronic cough 11/16/2012   CAD (coronary artery disease), native coronary artery 03/08/2011   Postsurgical percutaneous transluminal coronary angioplasty (PTCA) status 03/08/2011   Insulin  dependent type 2 diabetes mellitus (HCC) 03/08/2011   Essential hypertension 03/08/2011   Shortness of breath 03/08/2011   Mixed hyperlipidemia 03/08/2011    ONSET DATE: 10/01/23  REFERRING DIAG: R53.1 (ICD-10-CM) -  Weakness  THERAPY DIAG:  No diagnosis found.  Rationale for Evaluation and Treatment: Rehabilitation  SUBJECTIVE:                                                                                                                                                                                             SUBJECTIVE STATEMENT: ***  From eval: Patient reports  that he had a stroke which caused difficulty speaking but this has resolved. Reports that he suffered no physical effects from it. Reports that he has also had 2 mini strokes in the past. Reports that he doesn't get much exercise and knows that isn't good. Reports that he continues to have pain in the R shoulder since getting a TSA. Reports that he gets momentary dizzy spells when standing up from a chair. Reports 1 bad fall in January. Avoids stairs and wife got him a stair lift.    Pt accompanied by: significant other  PERTINENT HISTORY: Asthma, CAD, DMII, HLD, HTN, SOB, R TSA 2023  PAIN:  Are you having pain? Pt reports chronic R shoulder pain  PRECAUTIONS: Other: R shoulder pain limiting activity   RED FLAGS: None   WEIGHT BEARING RESTRICTIONS: No  FALLS: Has patient fallen in last 6 months? No  LIVING ENVIRONMENT: Lives with: lives with their spouse Lives in: House/apartment Stairs: 3 +1 steps, 2 story home; has stair lift which pt reports awkward/dizzy going up stairs without it Has following equipment at home: Single point cane, Walker - 2 wheeled, Environmental consultant - 4 wheeled, shower chair, and Grab bars  PLOF: Independent with basic ADLs and reports difficulty bathing/dressing d/t R shoulder pain  PATIENT GOALS: pt does not report  OBJECTIVE:  TREATMENT 11/15/23  Activity Comment  ***             Note: Objective measures were completed at Evaluation unless otherwise noted.  DIAGNOSTIC FINDINGS: 10/02/23 brain MRI: New focal area of susceptibility in the lateral aspect of the left thalamus is compatible with and interval punctate hemorrhage. This may be acute and related to the patient's symptoms . 2. Mildly advanced periventricular and subcortical T2 hyperintensities, similar to the prior exam. 3. Stable remote lacunar infarcts of the cerebellum.   COGNITION: Overall cognitive status: Within functional limits for tasks assessed   SENSATION: Pt denies N/T in UEs/LEs    COORDINATION: Alternating pronation/supination: WNL B Alternating toe tap: WNL B Finger to nose: slowed B   MUSCLE TONE: WNL B LEs   POSTURE: rounded shoulders and forward head, flexed trunk and frequently looking down   LOWER EXTREMITY ROM:     Active  Right Eval  Left Eval  Hip flexion    Hip extension    Hip abduction    Hip adduction    Hip internal rotation    Hip external rotation    Knee flexion    Knee extension    Ankle dorsiflexion 4 deg with knee bent -3 with bent knee  Ankle plantarflexion    Ankle inversion    Ankle eversion     (Blank rows = not tested)  LOWER EXTREMITY MMT:    MMT Right Eval Left Eval  Hip flexion 3+ 3+  Hip extension    Hip abduction 4 4-  Hip adduction 4+ 4+  Hip internal rotation    Hip external rotation    Knee flexion 3+ 3+  Knee extension 4- 4  Ankle dorsiflexion 4 4  Ankle plantarflexion 4 4  Ankle inversion    Ankle eversion    (Blank rows = not tested)  GAIT: Findings: Assistive device utilized:None, Level of assistance: Modified independence, and Comments: slowed speed  FUNCTIONAL TESTS:  5 times sit to stand: 18.16 sec without UEs  Timed up and go (TUG): 11.9 sec  Dynamic Gait Index: 19/24                                                                                                                                  PATIENT EDUCATION: Education details: prognosis, POC, discussed goals to work towards fitness regimen of some kind post-DC Person educated: Patient and Spouse Education method: Explanation, Facilities manager, Actor cues, and Verbal cues Education comprehension: verbalized understanding  HOME EXERCISE PROGRAM: Not yet initiated     GOALS: Goals reviewed with patient? Yes  SHORT TERM GOALS: Target date: 11/28/2023  Patient to be independent with initial HEP. Baseline: HEP initiated Goal status: INITIAL    LONG TERM GOALS: Target date: 12/26/2023  Patient to be independent with  advanced HEP. Baseline: Not yet initiated  Goal status: INITIAL  Patient to demonstrate B LE strength >/=4+/5.  Baseline: See above Goal status: INITIAL  Patient to demonstrate B dorsiflexion AROM to at least 10 degrees to improve efficiency of gait.  Baseline: 4, -3 deg with compensations  Goal status: INITIAL  Patient to score at least 22/24 on DGI in order to decrease risk of falls.  Baseline: 19 Goal status: INITIAL  Patient to demonstrate stair and curb navigation safely without handrail.   Baseline: step-to/through pattern with handrail Goal status: INITIAL  Patient to demonstrate 5xSTS test in <15 sec in order to decrease risk of falls.  Baseline: 18.16 sec Goal status: INITIAL  Patient to report plans to maintain fitness regimen at home or in the community.  Baseline: pt reports sedentary lifestyle currently  Goal status: INITIAL   ASSESSMENT:  CLINICAL IMPRESSION: ***  From eval: Patient is a 73 y/o F presenting to OPPT with c/o limited activity levels s/p hospital admission 10/01/23-10/02/23 for left thalamic punctate hemorrhage. Patient today presenting with Rounded  posture, limited B dorsiflexion AROM, marked LE weakness, increased time to complete transfers, and increased risk of falls according to DGI. Would benefit from skilled PT services 2 x/week for 4 weeks followed by 1x/week for 4 weeks to address aforementioned impairments in order to optimize level of function.    OBJECTIVE IMPAIRMENTS: Abnormal gait, decreased activity tolerance, decreased balance, decreased endurance, decreased knowledge of use of DME, difficulty walking, decreased ROM, decreased strength, impaired flexibility, postural dysfunction, and pain.   ACTIVITY LIMITATIONS: carrying, lifting, bending, sitting, standing, squatting, sleeping, stairs, transfers, bathing, toileting, dressing, reach over head, hygiene/grooming, and locomotion level  PARTICIPATION LIMITATIONS: meal prep, shopping,  community activity, and yard work  PERSONAL FACTORS: Age, Past/current experiences, Time since onset of injury/illness/exacerbation, and 3+ comorbidities: Asthma, CAD, DMII, HLD, HTN, SOB, R TSA 2023 are also affecting patient's functional outcome.   REHAB POTENTIAL: Good  CLINICAL DECISION MAKING: Evolving/moderate complexity  EVALUATION COMPLEXITY: Moderate  PLAN:  PT FREQUENCY: 2 x/week for 4 weeks followed by 1x/week for 4 weeks  PT DURATION: -  PLANNED INTERVENTIONS: 02835- PT Re-evaluation, 97110-Therapeutic exercises, 97530- Therapeutic activity, 97112- Neuromuscular re-education, 97535- Self Care, 02859- Manual therapy, [phone]- Gait training, [phone]- Canalith repositioning, [id number]- Electrical stimulation (manual), [phone] (1-2 muscles), [id number] (3+ muscles)- Dry Needling, Patient/Family education, Balance training, Stair training, Taping, Vestibular training, DME instructions, Cryotherapy, and Moist heat  PLAN FOR NEXT SESSION: initiate HEP for LE strengthening; work on stairs without handrail, if pt continues to be dizzy- check orthostatics; goal is to transition to fitness regimen or group fitness class  Elior Robinette April Ma L [place], [place], DPT 11/15/23 7:54 AM  [facility] at [facility] [address], [address] [place], [place] [address] Phone # [phone] Fax # [phone]

## 2023-11-16 NOTE — Therapy (Signed)
 OUTPATIENT PHYSICAL THERAPY NEURO TREATMENT   Patient Name: Alan Wang MRN: [id number] DOB:06-Apr-1950, 73 y.o., male Today's Date: 11/17/2023   PCP: Frederik Charleston, MD  REFERRING PROVIDER: Rosario Leatrice FERNS, MD  END OF SESSION:  PT End of Session - 11/17/23 1634     Visit Number 2    Number of Visits 13    Date for PT Re-Evaluation 12/26/23    Authorization Type [employer]    PT Start Time 1544    PT Stop Time 1628    PT Time Calculation (min) 44 min    Equipment Utilized During Treatment Gait belt    Activity Tolerance Patient tolerated treatment well    Behavior During Therapy WFL for tasks assessed/performed           Past Medical History:  Diagnosis Date   Arthritis    Asthma    Cancer (HCC)    skin   Coronary artery disease    COVID    Diabetes mellitus    type 2, insulin  dependent for 30+ years   History of kidney stones 11/2012   Hyperlipemia    Hypertension    IDDM (insulin  dependent diabetes mellitus)    Shortness of breath    Stroke (HCC)    hx TIA   Past Surgical History:  Procedure Laterality Date   ANGIOPLASTY  04/06/2011   x3    CARDIAC CATHETERIZATION     CARDIAC CATHETERIZATION N/A 09/16/2015   Procedure: Left Heart Cath and Coronary Angiography;  Surgeon: Gordy Bergamo, MD;  Location: [facility] INVASIVE CV LAB;  Service: Cardiovascular;  Laterality: N/A;   cataracts     CHOLECYSTECTOMY     HERNIA REPAIR     x2   KNEE ARTHROSCOPY     LEFT HEART CATH AND CORONARY ANGIOGRAPHY N/A 01/19/2023   Procedure: LEFT HEART CATH AND CORONARY ANGIOGRAPHY;  Surgeon: Verlin Lonni JONETTA, MD;  Location: [facility] INVASIVE CV LAB;  Service: Cardiovascular;  Laterality: N/A;   LEFT HEART CATHETERIZATION WITH CORONARY ANGIOGRAM N/A 03/08/2011   Procedure: LEFT HEART CATHETERIZATION WITH CORONARY ANGIOGRAM;  Surgeon: Erick JONELLE Bergamo, MD;  Location: [facility] CATH LAB;  Service: Cardiovascular;  Laterality: N/A;   PERCUTANEOUS CORONARY STENT INTERVENTION (PCI-S) N/A  04/06/2011   Procedure: PERCUTANEOUS CORONARY STENT INTERVENTION (PCI-S);  Surgeon: Erick JONELLE Bergamo, MD;  Location: [facility] CATH LAB;  Service: Cardiovascular;  Laterality: N/A;   TOTAL SHOULDER ARTHROPLASTY Right 01/07/2022   Procedure: TOTAL SHOULDER ARTHROPLASTY;  Surgeon: Melita Drivers, MD;  Location: [facility] ORS;  Service: Orthopedics;  Laterality: Right;    Patient Active Problem List   Diagnosis Date Noted   Secondary erythrocytosis 10/11/2023   Acute ischemic stroke (HCC) 10/02/2023   Expressive aphasia 10/02/2023   Polycythemia vera (HCC) 09/26/2023   Malnutrition of moderate degree 12/06/2022   Acute on chronic systolic CHF (congestive heart failure) (HCC) 12/04/2022   Elevated troponin 12/04/2022   Acute metabolic encephalopathy 08/22/2022   Acute on chronic diastolic CHF (congestive heart failure) (HCC) 08/22/2022   Demand ischemia (HCC) 08/22/2022   COVID-19 08/21/2022   TIA (transient ischemic attack) 06/22/2016   DM2 (diabetes mellitus, type 2) (HCC) 06/22/2016   Primary osteoarthritis of both hands 06/03/2016   Primary osteoarthritis of both knees 06/03/2016   Generalized pain 06/03/2016   Memory difficulties 06/03/2016   History of diabetes mellitus 06/03/2016   History of coronary artery disease 06/03/2016   History of hypertension 06/03/2016   History of hypercholesterolemia 06/03/2016   High risk medication use  06/03/2016   History of high cholesterol 06/03/2016   ANA positive 06/03/2016   Inflammatory arthritis 10/29/2015   Vitamin D deficiency 10/29/2015   Fatigue 09/24/2015   Vertigo 10/31/2013   Chronic cough 11/16/2012   CAD (coronary artery disease), native coronary artery 03/08/2011   Postsurgical percutaneous transluminal coronary angioplasty (PTCA) status 03/08/2011   Insulin  dependent type 2 diabetes mellitus (HCC) 03/08/2011   Essential hypertension 03/08/2011   Shortness of breath 03/08/2011   Mixed hyperlipidemia 03/08/2011    ONSET DATE:  10/01/23  REFERRING DIAG: R53.1 (ICD-10-CM) - Weakness  THERAPY DIAG:  Muscle weakness (generalized)  Unsteadiness on feet  Rationale for Evaluation and Treatment: Rehabilitation  SUBJECTIVE:                                                                                                                                                                                             SUBJECTIVE STATEMENT: You're going to have to go easy, I'm hurting today.    Pt accompanied by: significant other in waiting room   PERTINENT HISTORY: Asthma, CAD, DMII, HLD, HTN, SOB, R TSA 2023  PAIN:  Are you having pain? Yes: NPRS scale: 4-5/10 Pain location: generalized pain Pain description: ache Aggravating factors: pt suspects medication side effects Relieving factors: nothing  PRECAUTIONS: Other: R shoulder pain limiting activity   RED FLAGS: None   WEIGHT BEARING RESTRICTIONS: No  FALLS: Has patient fallen in last 6 months? No  LIVING ENVIRONMENT: Lives with: lives with their spouse Lives in: House/apartment Stairs: 3 +1 steps, 2 story home; has stair lift which pt reports awkward/dizzy going up stairs without it Has following equipment at home: Single point cane, Walker - 2 wheeled, Environmental consultant - 4 wheeled, shower chair, and Grab bars  PLOF: Independent with basic ADLs and reports difficulty bathing/dressing d/t R shoulder pain  PATIENT GOALS: pt does not report  OBJECTIVE:      TODAY'S TREATMENT: 11/17/23 Activity Comments  Nustep L4 x 6 min L UE and B LEs d/t R shoulder pain  Dynamic warm up. Good tolerance   STS 2x10  Without UEs but not standing fully; last 5 with EC   LAQ #4 x10 each  LAQ #3 x10 each  Difficulty completing d/t weakness; cueing for eccentric control. Better tolerance for 3lbs   In II bars with 2#: standing march 2x20 Sidestepping 2x 1 min  standing hip ABD 2x10  Compensations with hip ABD d/t weakness with cues for more upright posture. Reported some R  shoulder pain from holding onto II bars. Thus was advised to use L UE only.   step ups 6  Weaned to no UE support ; pt expressed some fear/hesitancy        HOME EXERCISE PROGRAM Last updated: 11/17/23 Access Code: [email or web address] URL: [email or web address] Date: 11/17/2023 Prepared by: [facility] Outpatient  Rehab - [facility]  Program Notes get a pair of adjustable ankle weights ranging 1-5lbs   Exercises - Sit to Stand Without Arm Support  - 1 x daily - 5 x weekly - 2 sets - 10 reps - Seated Long Arc Quad  - 1 x daily - 5 x weekly - 2 sets - 10 reps - Standing March with Counter Support  - 1 x daily - 5 x weekly - 2 sets - 10 reps - Standing Hip Abduction with Counter Support  - 1 x daily - 5 x weekly - 2 sets - 10 reps    PATIENT EDUCATION: Education details: HEP with edu for safety, edu on obtaining adjustable ankle weights 1-5lbs  Person educated: Patient Education method: Explanation, Demonstration, Tactile cues, Verbal cues, and Handouts Education comprehension: verbalized understanding and returned demonstration        Note: Objective measures were completed at Evaluation unless otherwise noted.  DIAGNOSTIC FINDINGS: 10/02/23 brain MRI: New focal area of susceptibility in the lateral aspect of the left thalamus is compatible with and interval punctate hemorrhage. This may be acute and related to the patient's symptoms . 2. Mildly advanced periventricular and subcortical T2 hyperintensities, similar to the prior exam. 3. Stable remote lacunar infarcts of the cerebellum.   COGNITION: Overall cognitive status: Within functional limits for tasks assessed   SENSATION: Pt denies N/T in UEs/LEs   COORDINATION: Alternating pronation/supination: WNL B Alternating toe tap: WNL B Finger to nose: slowed B   MUSCLE TONE: WNL B LEs   POSTURE: rounded shoulders and forward head, flexed trunk and frequently looking down   LOWER EXTREMITY ROM:      Active  Right Eval Left Eval  Hip flexion    Hip extension    Hip abduction    Hip adduction    Hip internal rotation    Hip external rotation    Knee flexion    Knee extension    Ankle dorsiflexion 4 deg with knee bent -3 with bent knee  Ankle plantarflexion    Ankle inversion    Ankle eversion     (Blank rows = not tested)  LOWER EXTREMITY MMT:    MMT Right Eval Left Eval  Hip flexion 3+ 3+  Hip extension    Hip abduction 4 4-  Hip adduction 4+ 4+  Hip internal rotation    Hip external rotation    Knee flexion 3+ 3+  Knee extension 4- 4  Ankle dorsiflexion 4 4  Ankle plantarflexion 4 4  Ankle inversion    Ankle eversion    (Blank rows = not tested)  GAIT: Findings: Assistive device utilized:None, Level of assistance: Modified independence, and Comments: slowed speed  FUNCTIONAL TESTS:  5 times sit to stand: 18.16 sec without UEs  Timed up and go (TUG): 11.9 sec  Dynamic Gait Index: 19/24  TREATMENT DATE: 10/31/23     PATIENT EDUCATION: Education details: prognosis, POC, discussed goals to work towards fitness regimen of some kind post-DC Person educated: Patient and Spouse Education method: Explanation, Demonstration, Actor cues, and Verbal cues Education comprehension: verbalized understanding  HOME EXERCISE PROGRAM: Not yet initiated     GOALS: Goals reviewed with patient? Yes  SHORT TERM GOALS: Target date: 11/28/2023  Patient to be independent with initial HEP. Baseline: HEP initiated Goal status: IN PROGRESS    LONG TERM GOALS: Target date: 12/26/2023  Patient to be independent with advanced HEP. Baseline: Not yet initiated  Goal status: IN PROGRESS  Patient to demonstrate B LE strength >/=4+/5.  Baseline: See above Goal status: IN PROGRESS  Patient to demonstrate B dorsiflexion AROM to at least  10 degrees to improve efficiency of gait.  Baseline: 4, -3 deg with compensations  Goal status: IN PROGRESS  Patient to score at least 22/24 on DGI in order to decrease risk of falls.  Baseline: 19 Goal status: IN PROGRESS  Patient to demonstrate stair and curb navigation safely without handrail.   Baseline: step-to/through pattern with handrail Goal status: IN PROGRESS  Patient to demonstrate 5xSTS test in <15 sec in order to decrease risk of falls.  Baseline: 18.16 sec Goal status: IN PROGRESS  Patient to report plans to maintain fitness regimen at home or in the community.  Baseline: pt reports sedentary lifestyle currently  Goal status: IN PROGRESS   ASSESSMENT:  CLINICAL IMPRESSION: Patient arrived to session with report of generalized pain. Worked on seated and standing LE strengthening. Patient required adjustments to weighted resistance to ensure proper form and success with LAQ d/t weakness. Hesitancy evident with step ups as pt reports avoiding stairs d/t fear of falling for a long time now. HEP was initiated for LE strengthening with exercises that were well-tolerated today. No complaints upon leaving.    OBJECTIVE IMPAIRMENTS: Abnormal gait, decreased activity tolerance, decreased balance, decreased endurance, decreased knowledge of use of DME, difficulty walking, decreased ROM, decreased strength, impaired flexibility, postural dysfunction, and pain.   ACTIVITY LIMITATIONS: carrying, lifting, bending, sitting, standing, squatting, sleeping, stairs, transfers, bathing, toileting, dressing, reach over head, hygiene/grooming, and locomotion level  PARTICIPATION LIMITATIONS: meal prep, shopping, community activity, and yard work  PERSONAL FACTORS: Age, Past/current experiences, Time since onset of injury/illness/exacerbation, and 3+ comorbidities: Asthma, CAD, DMII, HLD, HTN, SOB, R TSA 2023 are also affecting patient's functional outcome.   REHAB POTENTIAL:  Good  CLINICAL DECISION MAKING: Evolving/moderate complexity  EVALUATION COMPLEXITY: Moderate  PLAN:  PT FREQUENCY: 2 x/week for 4 weeks followed by 1x/week for 4 weeks  PT DURATION: -  PLANNED INTERVENTIONS: 02835- PT Re-evaluation, 97110-Therapeutic exercises, 97530- Therapeutic activity, 97112- Neuromuscular re-education, 97535- Self Care, 02859- Manual therapy, [phone]- Gait training, [phone]- Canalith repositioning, [id number]- Electrical stimulation (manual), [phone] (1-2 muscles), [id number] (3+ muscles)- Dry Needling, Patient/Family education, Balance training, Stair training, Taping, Vestibular training, DME instructions, Cryotherapy, and Moist heat  PLAN FOR NEXT SESSION: review HEP for LE strengthening; work on stairs without handrail, if pt continues to be dizzy- check orthostatics; goal is to transition to fitness regimen or group fitness class    Louana Terrilyn Christians, [place], DPT 11/17/23 4:36 PM  [facility] [address] [place], [place] [address] Phone # [phone] Fax # [phone]

## 2023-11-17 ENCOUNTER — Ambulatory Visit: Attending: Internal Medicine | Admitting: Physical Therapy

## 2023-11-17 ENCOUNTER — Encounter: Payer: Self-pay | Admitting: Physical Therapy

## 2023-11-17 DIAGNOSIS — M6281 Muscle weakness (generalized): Secondary | ICD-10-CM | POA: Diagnosis not present

## 2023-11-17 DIAGNOSIS — R2681 Unsteadiness on feet: Secondary | ICD-10-CM | POA: Insufficient documentation

## 2023-11-22 ENCOUNTER — Ambulatory Visit: Admitting: Physical Therapy

## 2023-11-22 ENCOUNTER — Encounter: Payer: Self-pay | Admitting: Physical Therapy

## 2023-11-22 DIAGNOSIS — R2681 Unsteadiness on feet: Secondary | ICD-10-CM | POA: Diagnosis not present

## 2023-11-22 DIAGNOSIS — M6281 Muscle weakness (generalized): Secondary | ICD-10-CM | POA: Diagnosis not present

## 2023-11-22 NOTE — Therapy (Signed)
 OUTPATIENT PHYSICAL THERAPY NEURO TREATMENT   Patient Name: Alan Wang MRN: [id number] DOB:05-07-1950, 73 y.o., male Today's Date: 11/22/2023   PCP: Frederik Charleston, MD  REFERRING PROVIDER: Rosario Leatrice FERNS, MD  END OF SESSION:  PT End of Session - 11/22/23 1623     Visit Number 3    Number of Visits 13    Date for PT Re-Evaluation 12/26/23    Authorization Type [employer]    PT Start Time 1620    PT Stop Time 1700    PT Time Calculation (min) 40 min    Equipment Utilized During Treatment Gait belt    Activity Tolerance Patient tolerated treatment well    Behavior During Therapy WFL for tasks assessed/performed            Past Medical History:  Diagnosis Date   Arthritis    Asthma    Cancer (HCC)    skin   Coronary artery disease    COVID    Diabetes mellitus    type 2, insulin  dependent for 30+ years   History of kidney stones 11/2012   Hyperlipemia    Hypertension    IDDM (insulin  dependent diabetes mellitus)    Shortness of breath    Stroke (HCC)    hx TIA   Past Surgical History:  Procedure Laterality Date   ANGIOPLASTY  04/06/2011   x3    CARDIAC CATHETERIZATION     CARDIAC CATHETERIZATION N/A 09/16/2015   Procedure: Left Heart Cath and Coronary Angiography;  Surgeon: Gordy Bergamo, MD;  Location: [facility] INVASIVE CV LAB;  Service: Cardiovascular;  Laterality: N/A;   cataracts     CHOLECYSTECTOMY     HERNIA REPAIR     x2   KNEE ARTHROSCOPY     LEFT HEART CATH AND CORONARY ANGIOGRAPHY N/A 01/19/2023   Procedure: LEFT HEART CATH AND CORONARY ANGIOGRAPHY;  Surgeon: Verlin Lonni JONETTA, MD;  Location: [facility] INVASIVE CV LAB;  Service: Cardiovascular;  Laterality: N/A;   LEFT HEART CATHETERIZATION WITH CORONARY ANGIOGRAM N/A 03/08/2011   Procedure: LEFT HEART CATHETERIZATION WITH CORONARY ANGIOGRAM;  Surgeon: Erick JONELLE Bergamo, MD;  Location: [facility] CATH LAB;  Service: Cardiovascular;  Laterality: N/A;   PERCUTANEOUS CORONARY STENT INTERVENTION (PCI-S) N/A  04/06/2011   Procedure: PERCUTANEOUS CORONARY STENT INTERVENTION (PCI-S);  Surgeon: Erick JONELLE Bergamo, MD;  Location: [facility] CATH LAB;  Service: Cardiovascular;  Laterality: N/A;   TOTAL SHOULDER ARTHROPLASTY Right 01/07/2022   Procedure: TOTAL SHOULDER ARTHROPLASTY;  Surgeon: Melita Drivers, MD;  Location: [facility] ORS;  Service: Orthopedics;  Laterality: Right;    Patient Active Problem List   Diagnosis Date Noted   Secondary erythrocytosis 10/11/2023   Acute ischemic stroke (HCC) 10/02/2023   Expressive aphasia 10/02/2023   Polycythemia vera (HCC) 09/26/2023   Malnutrition of moderate degree 12/06/2022   Acute on chronic systolic CHF (congestive heart failure) (HCC) 12/04/2022   Elevated troponin 12/04/2022   Acute metabolic encephalopathy 08/22/2022   Acute on chronic diastolic CHF (congestive heart failure) (HCC) 08/22/2022   Demand ischemia (HCC) 08/22/2022   COVID-19 08/21/2022   TIA (transient ischemic attack) 06/22/2016   DM2 (diabetes mellitus, type 2) (HCC) 06/22/2016   Primary osteoarthritis of both hands 06/03/2016   Primary osteoarthritis of both knees 06/03/2016   Generalized pain 06/03/2016   Memory difficulties 06/03/2016   History of diabetes mellitus 06/03/2016   History of coronary artery disease 06/03/2016   History of hypertension 06/03/2016   History of hypercholesterolemia 06/03/2016   High risk medication  use 06/03/2016   History of high cholesterol 06/03/2016   ANA positive 06/03/2016   Inflammatory arthritis 10/29/2015   Vitamin D deficiency 10/29/2015   Fatigue 09/24/2015   Vertigo 10/31/2013   Chronic cough 11/16/2012   CAD (coronary artery disease), native coronary artery 03/08/2011   Postsurgical percutaneous transluminal coronary angioplasty (PTCA) status 03/08/2011   Insulin  dependent type 2 diabetes mellitus (HCC) 03/08/2011   Essential hypertension 03/08/2011   Shortness of breath 03/08/2011   Mixed hyperlipidemia 03/08/2011    ONSET DATE:  10/01/23  REFERRING DIAG: R53.1 (ICD-10-CM) - Weakness  THERAPY DIAG:  Muscle weakness (generalized)  Unsteadiness on feet  Rationale for Evaluation and Treatment: Rehabilitation  SUBJECTIVE:                                                                                                                                                                                             SUBJECTIVE STATEMENT: Pt reports constant R shoulder pain from a prior R TSA. Will be seeing [facility] next month Dec 22, 2023. Pt notes his balance is still affected from the CVA and notes continued fatigue. Pt states he hasn't done exercises yet -- hasn't gotten ankle weights.    Pt accompanied by: significant other in waiting room   PERTINENT HISTORY: Asthma, CAD, DMII, HLD, HTN, SOB, R TSA 2023  PAIN:  Are you having pain? Yes: NPRS scale: 4-5/10 Pain location: generalized pain Pain description: ache Aggravating factors: pt suspects medication side effects Relieving factors: nothing  PRECAUTIONS: Other: R shoulder pain limiting activity   RED FLAGS: None   WEIGHT BEARING RESTRICTIONS: No  FALLS: Has patient fallen in last 6 months? No  LIVING ENVIRONMENT: Lives with: lives with their spouse Lives in: House/apartment Stairs: 3 +1 steps, 2 story home; has stair lift which pt reports awkward/dizzy going up stairs without it Has following equipment at home: Single point cane, Walker - 2 wheeled, Environmental consultant - 4 wheeled, shower chair, and Grab bars  PLOF: Independent with basic ADLs and reports difficulty bathing/dressing d/t R shoulder pain  PATIENT GOALS: pt does not report  OBJECTIVE:      TODAY'S TREATMENT: 11/17/23 Activity Comments  Nustep L6 x 8 min bilat LEs Dynamic warm up. Good tolerance   STS 2x10 8# Without UEs but not standing fully; last 5 with EC   Seated LAQ red TB 2x10 Seated hamstring curl red TB 2x10 Difficulty completing d/t weakness; cueing for eccentric control.   Standing next  to treadmill: standing march red TB 2x10 standing hip ABD red TB 2x10  Standing hip ext red TB 2x10 Heel raise 2x10 Compensations with hip  ABD d/t weakness with cues for more upright posture. Reported some R shoulder pain from holding onto II bars. Thus was advised to use L UE only.   Fwd step tap 6 step 2x10 Staggered stance on 6 step x30 with head turns and head nods x 30 Staggered stance on 6 step EC 2x30  LOB with head turns/nods   Intermittently had to open eyes to regain balance        HOME EXERCISE PROGRAM Last updated: 11/17/23 Access Code: [email or web address] URL: [email or web address] Date: 11/17/2023 Prepared by: [facility] - Outpatient  Rehab - [facility]  Program Notes get a pair of adjustable ankle weights ranging 1-5lbs   Exercises - Sit to Stand Without Arm Support  - 1 x daily - 5 x weekly - 2 sets - 10 reps - Seated Long Arc Quad  - 1 x daily - 5 x weekly - 2 sets - 10 reps - Standing March with Counter Support  - 1 x daily - 5 x weekly - 2 sets - 10 reps - Standing Hip Abduction with Counter Support  - 1 x daily - 5 x weekly - 2 sets - 10 reps    PATIENT EDUCATION: Education details: HEP with edu for safety, edu on obtaining adjustable ankle weights 1-5lbs vs resistance bands Person educated: Patient Education method: Explanation, Demonstration, Tactile cues, Verbal cues, and Handouts Education comprehension: verbalized understanding and returned demonstration        Note: Objective measures were completed at Evaluation unless otherwise noted.  DIAGNOSTIC FINDINGS: 10/02/23 brain MRI: New focal area of susceptibility in the lateral aspect of the left thalamus is compatible with and interval punctate hemorrhage. This may be acute and related to the patient's symptoms . 2. Mildly advanced periventricular and subcortical T2 hyperintensities, similar to the prior exam. 3. Stable remote lacunar infarcts of the  cerebellum.   COGNITION: Overall cognitive status: Within functional limits for tasks assessed   SENSATION: Pt denies N/T in UEs/LEs   COORDINATION: Alternating pronation/supination: WNL B Alternating toe tap: WNL B Finger to nose: slowed B   MUSCLE TONE: WNL B LEs   POSTURE: rounded shoulders and forward head, flexed trunk and frequently looking down   LOWER EXTREMITY ROM:     Active  Right Eval Left Eval  Hip flexion    Hip extension    Hip abduction    Hip adduction    Hip internal rotation    Hip external rotation    Knee flexion    Knee extension    Ankle dorsiflexion 4 deg with knee bent -3 with bent knee  Ankle plantarflexion    Ankle inversion    Ankle eversion     (Blank rows = not tested)  LOWER EXTREMITY MMT:    MMT Right Eval Left Eval  Hip flexion 3+ 3+  Hip extension    Hip abduction 4 4-  Hip adduction 4+ 4+  Hip internal rotation    Hip external rotation    Knee flexion 3+ 3+  Knee extension 4- 4  Ankle dorsiflexion 4 4  Ankle plantarflexion 4 4  Ankle inversion    Ankle eversion    (Blank rows = not tested)  GAIT: Findings: Assistive device utilized:None, Level of assistance: Modified independence, and Comments: slowed speed  FUNCTIONAL TESTS:  5 times sit to stand: 18.16 sec without UEs  Timed up and go (TUG): 11.9 sec  Dynamic Gait Index: 19/24  TREATMENT DATE: 10/31/23     PATIENT EDUCATION: Education details: prognosis, POC, discussed goals to work towards fitness regimen of some kind post-DC Person educated: Patient and Spouse Education method: Explanation, Demonstration, Actor cues, and Verbal cues Education comprehension: verbalized understanding  HOME EXERCISE PROGRAM: Not yet initiated     GOALS: Goals reviewed with patient? Yes  SHORT TERM GOALS: Target date:  11/28/2023  Patient to be independent with initial HEP. Baseline: HEP initiated Goal status: IN PROGRESS    LONG TERM GOALS: Target date: 12/26/2023  Patient to be independent with advanced HEP. Baseline: Not yet initiated  Goal status: IN PROGRESS  Patient to demonstrate B LE strength >/=4+/5.  Baseline: See above Goal status: IN PROGRESS  Patient to demonstrate B dorsiflexion AROM to at least 10 degrees to improve efficiency of gait.  Baseline: 4, -3 deg with compensations  Goal status: IN PROGRESS  Patient to score at least 22/24 on DGI in order to decrease risk of falls.  Baseline: 19 Goal status: IN PROGRESS  Patient to demonstrate stair and curb navigation safely without handrail.   Baseline: step-to/through pattern with handrail Goal status: IN PROGRESS  Patient to demonstrate 5xSTS test in <15 sec in order to decrease risk of falls.  Baseline: 18.16 sec Goal status: IN PROGRESS  Patient to report plans to maintain fitness regimen at home or in the community.  Baseline: pt reports sedentary lifestyle currently  Goal status: IN PROGRESS   ASSESSMENT:  CLINICAL IMPRESSION: Pt reports he has not been able to get ankle weights to do his HEP. Today focused on how to perform HEP with resistance bands instead. Pt tolerated well. Initiated standing balance exercises. Challenged when tasked to perform with eyes closed and with head turns/nods.   OBJECTIVE IMPAIRMENTS: Abnormal gait, decreased activity tolerance, decreased balance, decreased endurance, decreased knowledge of use of DME, difficulty walking, decreased ROM, decreased strength, impaired flexibility, postural dysfunction, and pain.   ACTIVITY LIMITATIONS: carrying, lifting, bending, sitting, standing, squatting, sleeping, stairs, transfers, bathing, toileting, dressing, reach over head, hygiene/grooming, and locomotion level  PARTICIPATION LIMITATIONS: meal prep, shopping, community activity, and yard  work  PERSONAL FACTORS: Age, Past/current experiences, Time since onset of injury/illness/exacerbation, and 3+ comorbidities: Asthma, CAD, DMII, HLD, HTN, SOB, R TSA 2023 are also affecting patient's functional outcome.   REHAB POTENTIAL: Good  CLINICAL DECISION MAKING: Evolving/moderate complexity  EVALUATION COMPLEXITY: Moderate  PLAN:  PT FREQUENCY: 2 x/week for 4 weeks followed by 1x/week for 4 weeks  PT DURATION: -  PLANNED INTERVENTIONS: [id number]- PT Re-evaluation, 97110-Therapeutic exercises, 97530- Therapeutic activity, 97112- Neuromuscular re-education, 97535- Self Care, [id number]- Manual therapy, [phone]- Gait training, [phone]- Canalith repositioning, [id number]- Electrical stimulation (manual), [phone] (1-2 muscles), [phone] (3+ muscles)- Dry Needling, Patient/Family education, Balance training, Stair training, Taping, Vestibular training, DME instructions, Cryotherapy, and Moist heat  PLAN FOR NEXT SESSION: review HEP for LE strengthening; work on stairs without handrail, if pt continues to be dizzy- check orthostatics; goal is to transition to fitness regimen or group fitness class    Ramanda Paules April Ma L [place], [place], DPT 11/22/23 4:23 PM  [facility] at [facility] [address] [place], [place] [address] Phone # [phone] Fax # [phone]

## 2023-11-24 ENCOUNTER — Ambulatory Visit: Admitting: Physical Therapy

## 2023-11-24 ENCOUNTER — Encounter: Payer: Self-pay | Admitting: Hematology and Oncology

## 2023-11-24 ENCOUNTER — Encounter: Payer: Self-pay | Admitting: Physical Therapy

## 2023-11-24 DIAGNOSIS — R2681 Unsteadiness on feet: Secondary | ICD-10-CM

## 2023-11-24 DIAGNOSIS — M6281 Muscle weakness (generalized): Secondary | ICD-10-CM

## 2023-11-24 NOTE — Therapy (Signed)
 OUTPATIENT PHYSICAL THERAPY NEURO TREATMENT   Patient Name: Alan Wang MRN: [id number] DOB:17-Oct-1950, 73 y.o., male Today's Date: 11/24/2023   PCP: Frederik Charleston, MD  REFERRING PROVIDER: Rosario Leatrice FERNS, MD  END OF SESSION:  PT End of Session - 11/24/23 1616     Visit Number 4    Number of Visits 13    Date for Recertification  12/26/23    Authorization Type [employer] Medicare    PT Start Time 1617    PT Stop Time 1700    PT Time Calculation (min) 43 min    Equipment Utilized During Treatment Gait belt    Activity Tolerance Patient tolerated treatment well    Behavior During Therapy WFL for tasks assessed/performed             Past Medical History:  Diagnosis Date   Arthritis    Asthma    Cancer (HCC)    skin   Coronary artery disease    COVID    Diabetes mellitus    type 2, insulin  dependent for 30+ years   History of kidney stones 11/2012   Hyperlipemia    Hypertension    IDDM (insulin  dependent diabetes mellitus)    Shortness of breath    Stroke (HCC)    hx TIA   Past Surgical History:  Procedure Laterality Date   ANGIOPLASTY  04/06/2011   x3    CARDIAC CATHETERIZATION     CARDIAC CATHETERIZATION N/A 09/16/2015   Procedure: Left Heart Cath and Coronary Angiography;  Surgeon: Gordy Bergamo, MD;  Location: [facility] INVASIVE CV LAB;  Service: Cardiovascular;  Laterality: N/A;   cataracts     CHOLECYSTECTOMY     HERNIA REPAIR     x2   KNEE ARTHROSCOPY     LEFT HEART CATH AND CORONARY ANGIOGRAPHY N/A 01/19/2023   Procedure: LEFT HEART CATH AND CORONARY ANGIOGRAPHY;  Surgeon: Verlin Lonni JONETTA, MD;  Location: [facility] INVASIVE CV LAB;  Service: Cardiovascular;  Laterality: N/A;   LEFT HEART CATHETERIZATION WITH CORONARY ANGIOGRAM N/A 03/08/2011   Procedure: LEFT HEART CATHETERIZATION WITH CORONARY ANGIOGRAM;  Surgeon: Erick JONELLE Bergamo, MD;  Location: [facility] CATH LAB;  Service: Cardiovascular;  Laterality: N/A;   PERCUTANEOUS CORONARY STENT INTERVENTION (PCI-S)  N/A 04/06/2011   Procedure: PERCUTANEOUS CORONARY STENT INTERVENTION (PCI-S);  Surgeon: Erick JONELLE Bergamo, MD;  Location: [facility] CATH LAB;  Service: Cardiovascular;  Laterality: N/A;   TOTAL SHOULDER ARTHROPLASTY Right 01/07/2022   Procedure: TOTAL SHOULDER ARTHROPLASTY;  Surgeon: Melita Drivers, MD;  Location: [facility] ORS;  Service: Orthopedics;  Laterality: Right;    Patient Active Problem List   Diagnosis Date Noted   Secondary erythrocytosis 10/11/2023   Acute ischemic stroke (HCC) 10/02/2023   Expressive aphasia 10/02/2023   Polycythemia vera (HCC) 09/26/2023   Malnutrition of moderate degree 12/06/2022   Acute on chronic systolic CHF (congestive heart failure) (HCC) 12/04/2022   Elevated troponin 12/04/2022   Acute metabolic encephalopathy 08/22/2022   Acute on chronic diastolic CHF (congestive heart failure) (HCC) 08/22/2022   Demand ischemia (HCC) 08/22/2022   COVID-19 08/21/2022   TIA (transient ischemic attack) 06/22/2016   DM2 (diabetes mellitus, type 2) (HCC) 06/22/2016   Primary osteoarthritis of both hands 06/03/2016   Primary osteoarthritis of both knees 06/03/2016   Generalized pain 06/03/2016   Memory difficulties 06/03/2016   History of diabetes mellitus 06/03/2016   History of coronary artery disease 06/03/2016   History of hypertension 06/03/2016   History of hypercholesterolemia 06/03/2016   High risk  medication use 06/03/2016   History of high cholesterol 06/03/2016   ANA positive 06/03/2016   Inflammatory arthritis 10/29/2015   Vitamin D deficiency 10/29/2015   Fatigue 09/24/2015   Vertigo 10/31/2013   Chronic cough 11/16/2012   CAD (coronary artery disease), native coronary artery 03/08/2011   Postsurgical percutaneous transluminal coronary angioplasty (PTCA) status 03/08/2011   Insulin  dependent type 2 diabetes mellitus (HCC) 03/08/2011   Essential hypertension 03/08/2011   Shortness of breath 03/08/2011   Mixed hyperlipidemia 03/08/2011    ONSET DATE:  10/01/23  REFERRING DIAG: R53.1 (ICD-10-CM) - Weakness  THERAPY DIAG:  Muscle weakness (generalized)  Unsteadiness on feet  Rationale for Evaluation and Treatment: Rehabilitation  SUBJECTIVE:                                                                                                                                                                                             SUBJECTIVE STATEMENT: Pt states [facility] has sent a letter that they are in negotiations with [employer] -- may not cover payment his ortho appointment.  Reports he's been lazy and hasn't done any of his exercises.    Pt accompanied by: significant other in waiting room   PERTINENT HISTORY: Asthma, CAD, DMII, HLD, HTN, SOB, R TSA 2023  PAIN:  Are you having pain? Yes: NPRS scale: 4-5/10 Pain location: generalized pain Pain description: ache Aggravating factors: pt suspects medication side effects Relieving factors: nothing  PRECAUTIONS: Other: R shoulder pain limiting activity   RED FLAGS: None   WEIGHT BEARING RESTRICTIONS: No  FALLS: Has patient fallen in last 6 months? No  LIVING ENVIRONMENT: Lives with: lives with their spouse Lives in: House/apartment Stairs: 3 +1 steps, 2 story home; has stair lift which pt reports awkward/dizzy going up stairs without it Has following equipment at home: Single point cane, Walker - 2 wheeled, Environmental consultant - 4 wheeled, shower chair, and Grab bars  PLOF: Independent with basic ADLs and reports difficulty bathing/dressing d/t R shoulder pain  PATIENT GOALS: pt does not report  OBJECTIVE:   TODAY'S TREATMENT: 11/24/23 Activity Comments  Nustep L5 x 10 min bilat LEs Dynamic warm up. Good tolerance   STS 2x10 10# Without UEs  Deadlift 2x10 10# Modified to half way  On airex beam: Side stepping x 5 laps Fwd walking x 5 laps  Wide tandem stance 2x20 EO Heel tap fwd off beam 2x10 Heel raise on beam x20 Toe raise on beam x20 In // bars with intermittent UE  support  Keeps hips pushed back Moderate instability            HOME EXERCISE PROGRAM  Last updated: 11/17/23 Access Code: [email or web address] URL: [email or web address] Date: 11/17/2023 Prepared by: [facility] - Outpatient  Rehab - [facility]  Program Notes get a pair of adjustable ankle weights ranging 1-5lbs   Exercises - Sit to Stand Without Arm Support  - 1 x daily - 5 x weekly - 2 sets - 10 reps - Seated Long Arc Quad  - 1 x daily - 5 x weekly - 2 sets - 10 reps - Standing March with Counter Support  - 1 x daily - 5 x weekly - 2 sets - 10 reps - Standing Hip Abduction with Counter Support  - 1 x daily - 5 x weekly - 2 sets - 10 reps    PATIENT EDUCATION: Education details: HEP Person educated: Patient Education method: Programmer, multimedia, Demonstration, Tactile cues, Verbal cues, and Handouts Education comprehension: verbalized understanding and returned demonstration        Note: Objective measures were completed at Evaluation unless otherwise noted.  DIAGNOSTIC FINDINGS: 10/02/23 brain MRI: New focal area of susceptibility in the lateral aspect of the left thalamus is compatible with and interval punctate hemorrhage. This may be acute and related to the patient's symptoms . 2. Mildly advanced periventricular and subcortical T2 hyperintensities, similar to the prior exam. 3. Stable remote lacunar infarcts of the cerebellum.   COGNITION: Overall cognitive status: Within functional limits for tasks assessed   SENSATION: Pt denies N/T in UEs/LEs   COORDINATION: Alternating pronation/supination: WNL B Alternating toe tap: WNL B Finger to nose: slowed B   MUSCLE TONE: WNL B LEs   POSTURE: rounded shoulders and forward head, flexed trunk and frequently looking down   LOWER EXTREMITY ROM:     Active  Right Eval Left Eval  Hip flexion    Hip extension    Hip abduction    Hip adduction    Hip internal rotation    Hip external rotation    Knee  flexion    Knee extension    Ankle dorsiflexion 4 deg with knee bent -3 with bent knee  Ankle plantarflexion    Ankle inversion    Ankle eversion     (Blank rows = not tested)  LOWER EXTREMITY MMT:    MMT Right Eval Left Eval  Hip flexion 3+ 3+  Hip extension    Hip abduction 4 4-  Hip adduction 4+ 4+  Hip internal rotation    Hip external rotation    Knee flexion 3+ 3+  Knee extension 4- 4  Ankle dorsiflexion 4 4  Ankle plantarflexion 4 4  Ankle inversion    Ankle eversion    (Blank rows = not tested)  GAIT: Findings: Assistive device utilized:None, Level of assistance: Modified independence, and Comments: slowed speed  FUNCTIONAL TESTS:  5 times sit to stand: 18.16 sec without UEs  Timed up and go (TUG): 11.9 sec  Dynamic Gait Index: 19/24            GOALS: Goals reviewed with patient? Yes  SHORT TERM GOALS: Target date: 11/28/2023  Patient to be independent with initial HEP. Baseline: HEP initiated Goal status: NOT MET    LONG TERM GOALS: Target date: 12/26/2023  Patient to be independent with advanced HEP. Baseline: Not yet initiated  Goal status: IN PROGRESS  Patient to demonstrate B LE strength >/=4+/5.  Baseline: See above Goal status: IN PROGRESS  Patient to demonstrate B dorsiflexion AROM to at least 10 degrees to improve efficiency of gait.  Baseline: 4, -3 deg with compensations  Goal status: IN PROGRESS  Patient to score at least 22/24 on DGI in order to decrease risk of falls.  Baseline: 19 Goal status: IN PROGRESS  Patient to demonstrate stair and curb navigation safely without handrail.   Baseline: step-to/through pattern with handrail Goal status: IN PROGRESS  Patient to demonstrate 5xSTS test in <15 sec in order to decrease risk of falls.  Baseline: 18.16 sec Goal status: IN PROGRESS  Patient to report plans to maintain fitness regimen at home or in the community.  Baseline: pt reports sedentary lifestyle currently   Goal status: IN PROGRESS   ASSESSMENT:  CLINICAL IMPRESSION: Worked on functional strengthening and endurance today. Initiated dynamic balance on foam. Pt challenged with compliant surface. Continuing to work towards [employer].    OBJECTIVE IMPAIRMENTS: Abnormal gait, decreased activity tolerance, decreased balance, decreased endurance, decreased knowledge of use of DME, difficulty walking, decreased ROM, decreased strength, impaired flexibility, postural dysfunction, and pain.   ACTIVITY LIMITATIONS: carrying, lifting, bending, sitting, standing, squatting, sleeping, stairs, transfers, bathing, toileting, dressing, reach over head, hygiene/grooming, and locomotion level  PARTICIPATION LIMITATIONS: meal prep, shopping, community activity, and yard work  PERSONAL FACTORS: Age, Past/current experiences, Time since onset of injury/illness/exacerbation, and 3+ comorbidities: Asthma, CAD, DMII, HLD, HTN, SOB, R TSA 2023 are also affecting patient's functional outcome.   REHAB POTENTIAL: Good  CLINICAL DECISION MAKING: Evolving/moderate complexity  EVALUATION COMPLEXITY: Moderate  PLAN:  PT FREQUENCY: 2 x/week for 4 weeks followed by 1x/week for 4 weeks  PT DURATION: -  PLANNED INTERVENTIONS: 02835- PT Re-evaluation, 97110-Therapeutic exercises, 97530- Therapeutic activity, 97112- Neuromuscular re-education, 97535- Self Care, 02859- Manual therapy, [phone]- Gait training, [phone]- Canalith repositioning, [id number]- Electrical stimulation (manual), [phone] (1-2 muscles), [phone] (3+ muscles)- Dry Needling, Patient/Family education, Balance training, Stair training, Taping, Vestibular training, DME instructions, Cryotherapy, and Moist heat  PLAN FOR NEXT SESSION: review HEP for LE strengthening; work on stairs without handrail, if pt continues to be dizzy- check orthostatics; goal is to transition to fitness regimen or group fitness class    Imani Sherrin April Ma L [place], [place], DPT 11/24/23 4:17 PM  [facility]  Health Outpatient Rehab at [facility] [address] [place], [place] [address] Phone # [phone] Fax # [phone]

## 2023-11-27 ENCOUNTER — Other Ambulatory Visit: Payer: Self-pay | Admitting: Internal Medicine

## 2023-11-29 ENCOUNTER — Encounter: Payer: Self-pay | Admitting: Physical Therapy

## 2023-11-29 ENCOUNTER — Ambulatory Visit: Admitting: Physical Therapy

## 2023-11-29 DIAGNOSIS — M6281 Muscle weakness (generalized): Secondary | ICD-10-CM

## 2023-11-29 DIAGNOSIS — R2681 Unsteadiness on feet: Secondary | ICD-10-CM | POA: Diagnosis not present

## 2023-11-29 DIAGNOSIS — E1165 Type 2 diabetes mellitus with hyperglycemia: Secondary | ICD-10-CM | POA: Diagnosis not present

## 2023-11-29 NOTE — Therapy (Signed)
 OUTPATIENT PHYSICAL THERAPY NEURO TREATMENT   Patient Name: Alan Wang MRN: [id number] DOB:[birth year], 73 y.o., male Today's Date: 11/29/2023   PCP: Frederik Charleston, MD  REFERRING PROVIDER: Rosario Leatrice FERNS, MD  END OF SESSION:  PT End of Session - 11/29/23 1602     Visit Number 5    Number of Visits 13    Date for Recertification  12/26/23    Authorization Type A[employer]na Medicare    PT Start Time 1610    PT Stop Time 1655    PT Time Calculation (min) 45 min    Equipment Utilized During Treatment Gait belt    Activity Tolerance Patient tolerated treatment well    Behavior During Therapy [facility] for tasks assessed/performed             Past Medical History:  Diagnosis Date   Arthritis    Asthma    Cancer (HCC)    skin   Coronary artery disease    COVID    Diabetes mellitus    type 2, insulin  dependent for 30+ years   History of kidney stones 11/2012   Hyperlipemia    Hypertension    IDDM (insulin  dependent diabetes mellitus)    Shortness of breath    Stroke (HCC)    hx TIA   Past Surgical History:  Procedure Laterality Date   ANGIOPLASTY  04/06/2011   x3    CARDIAC CATHETERIZATION     CARDIAC CATHETERIZATION N/A 09/16/2015   Procedure: Left Heart Cath and Coronary Angiography;  Surgeon: Gordy Bergamo, MD;  Location: [facility] INVASIVE CV LAB;  Service: Cardiovascular;  Laterality: N/A;   cataracts     CHOLECYSTECTOMY     HERNIA REPAIR     x2   KNEE ARTHROSCOPY     LEFT HEART CATH AND CORONARY ANGIOGRAPHY N/A 01/19/2023   Procedure: LEFT HEART CATH AND CORONARY ANGIOGRAPHY;  Surgeon: Verlin Lonni JONETTA, MD;  Location: [facility] INVASIVE CV LAB;  Service: Cardiovascular;  Laterality: N/A;   LEFT HEART CATHETERIZATION WITH CORONARY ANGIOGRAM N/A 03/08/2011   Procedure: LEFT HEART CATHETERIZATION WITH CORONARY ANGIOGRAM;  Surgeon: Erick JONELLE Bergamo, MD;  Location: [facility] CATH LAB;  Service: Cardiovascular;  Laterality: N/A;   PERCUTANEOUS CORONARY STENT INTERVENTION (PCI-S)  N/A 04/06/2011   Procedure: PERCUTANEOUS CORONARY STENT INTERVENTION (PCI-S);  Surgeon: Erick JONELLE Bergamo, MD;  Location: [facility] CATH LAB;  Service: Cardiovascular;  Laterality: N/A;   TOTAL SHOULDER ARTHROPLASTY Right 01/07/2022   Procedure: TOTAL SHOULDER ARTHROPLASTY;  Surgeon: Melita Drivers, MD;  Location: [facility] ORS;  Service: Orthopedics;  Laterality: Right;    Patient Active Problem List   Diagnosis Date Noted   Secondary erythrocytosis 10/11/2023   Acute ischemic stroke (HCC) 10/02/2023   Expressive aphasia 10/02/2023   Polycythemia vera (HCC) 09/26/2023   Malnutrition of moderate degree 12/06/2022   Acute on chronic systolic CHF (congestive heart failure) (HCC) 12/04/2022   Elevated troponin 12/04/2022   Acute metabolic encephalopathy 08/22/2022   Acute on chronic diastolic CHF (congestive heart failure) (HCC) 08/22/2022   Demand ischemia (HCC) 08/22/2022   COVID-19 08/21/2022   TIA (transient ischemic attack) 06/22/2016   DM2 (diabetes mellitus, type 2) (HCC) 06/22/2016   Primary osteoarthritis of both hands 06/03/2016   Primary osteoarthritis of both knees 06/03/2016   Generalized pain 06/03/2016   Memory difficulties 06/03/2016   History of diabetes mellitus 06/03/2016   History of coronary artery disease 06/03/2016   History of hypertension 06/03/2016   History of hypercholesterolemia 06/03/2016   High risk  medication use 06/03/2016   History of high cholesterol 06/03/2016   ANA positive 06/03/2016   Inflammatory arthritis 10/29/2015   Vitamin D deficiency 10/29/2015   Fatigue 09/24/2015   Vertigo 10/31/2013   Chronic cough 11/16/2012   CAD (coronary artery disease), native coronary artery 03/08/2011   Postsurgical percutaneous transluminal coronary angioplasty (PTCA) status 03/08/2011   Insulin  dependent type 2 diabetes mellitus (HCC) 03/08/2011   Essential hypertension 03/08/2011   Shortness of breath 03/08/2011   Mixed hyperlipidemia 03/08/2011    ONSET DATE:  10/01/23  REFERRING DIAG: R53.1 (ICD-10-CM) - Weakness  THERAPY DIAG:  Muscle weakness (generalized)  Unsteadiness on feet  Rationale for Evaluation and Treatment: Rehabilitation  SUBJECTIVE:                                                                                                                                                                                             SUBJECTIVE STATEMENT: Pt reports generalized pain as the day has gone on.    Pt accompanied by: significant other in waiting room   PERTINENT HISTORY: Asthma, CAD, DMII, HLD, HTN, SOB, R TSA 2023  PAIN:  Are you having pain? Yes: NPRS scale: 4-5/10 Pain location: generalized pain Pain description: ache Aggravating factors: pt suspects medication side effects Relieving factors: nothing  PRECAUTIONS: Other: R shoulder pain limiting activity   RED FLAGS: None   WEIGHT BEARING RESTRICTIONS: No  FALLS: Has patient fallen in last 6 months? No  LIVING ENVIRONMENT: Lives with: lives with their spouse Lives in: House/apartment Stairs: 3 +1 steps, 2 story home; has stair lift which pt reports awkward/dizzy going up stairs without it Has following equipment at home: Single point cane, Walker - 2 wheeled, Environmental consultant - 4 wheeled, shower chair, and Grab bars  PLOF: Independent with basic ADLs and reports difficulty bathing/dressing d/t R shoulder pain  PATIENT GOALS: pt does not report  OBJECTIVE:   TODAY'S TREATMENT: 11/29/23 Activity Comments  Nustep L4-L7 x 10 min bilat LEs/UEs Intermittent increase/decrease of resistance for HIIT  Seated SLR 2# 2x10 Seated LAQ 2# 2x10 Limited due to tight hamstrings  Standing hip abd 2# 2x10 Standing hip ext 2# 2x10 Standing marching 2# 2x10 Standing hamstring curl 2# 2x10  Cues to keep from trunk lean  On rockerboard: Fwd/bwd 3x10 first with 2 UE support, 1 UE support and then none Static balance x10, x20, x30 with rockerboard in PA position Side to side  rocking 2x10 with intermittent UE support Static balance 2x30 with rockerboard in lateral position   On airex beam: Side stepping x 5 laps Heel tap fwd off beam 2x10  HOME EXERCISE PROGRAM Last updated: 11/17/23 Access Code: [email or web address] URL: [email or web address] Date: 11/17/2023 Prepared by: [facility] - Outpatient  Rehab - [facility]  Program Notes get a pair of adjustable ankle weights ranging 1-5lbs   Exercises - Sit to Stand Without Arm Support  - 1 x daily - 5 x weekly - 2 sets - 10 reps - Seated Long Arc Quad  - 1 x daily - 5 x weekly - 2 sets - 10 reps - Standing March with Counter Support  - 1 x daily - 5 x weekly - 2 sets - 10 reps - Standing Hip Abduction with Counter Support  - 1 x daily - 5 x weekly - 2 sets - 10 reps    PATIENT EDUCATION: Education details: HEP Person educated: Patient Education method: Programmer, multimedia, Demonstration, Tactile cues, Verbal cues, and Handouts Education comprehension: verbalized understanding and returned demonstration        Note: Objective measures were completed at Evaluation unless otherwise noted.  DIAGNOSTIC FINDINGS: 10/02/23 brain MRI: New focal area of susceptibility in the lateral aspect of the left thalamus is compatible with and interval punctate hemorrhage. This may be acute and related to the patient's symptoms . 2. Mildly advanced periventricular and subcortical T2 hyperintensities, similar to the prior exam. 3. Stable remote lacunar infarcts of the cerebellum.   COGNITION: Overall cognitive status: Within functional limits for tasks assessed   SENSATION: Pt denies N/T in UEs/LEs   COORDINATION: Alternating pronation/supination: WNL B Alternating toe tap: WNL B Finger to nose: slowed B   MUSCLE TONE: WNL B LEs   POSTURE: rounded shoulders and forward head, flexed trunk and frequently looking down   LOWER EXTREMITY ROM:     Active  Right Eval Left Eval  Hip flexion     Hip extension    Hip abduction    Hip adduction    Hip internal rotation    Hip external rotation    Knee flexion    Knee extension    Ankle dorsiflexion 4 deg with knee bent -3 with bent knee  Ankle plantarflexion    Ankle inversion    Ankle eversion     (Blank rows = not tested)  LOWER EXTREMITY MMT:    MMT Right Eval Left Eval  Hip flexion 3+ 3+  Hip extension    Hip abduction 4 4-  Hip adduction 4+ 4+  Hip internal rotation    Hip external rotation    Knee flexion 3+ 3+  Knee extension 4- 4  Ankle dorsiflexion 4 4  Ankle plantarflexion 4 4  Ankle inversion    Ankle eversion    (Blank rows = not tested)  GAIT: Findings: Assistive device utilized:None, Level of assistance: Modified independence, and Comments: slowed speed  FUNCTIONAL TESTS:  5 times sit to stand: 18.16 sec without UEs  Timed up and go (TUG): 11.9 sec  Dynamic Gait Index: 19/24            GOALS: Goals reviewed with patient? Yes  SHORT TERM GOALS: Target date: 11/28/2023  Patient to be independent with initial HEP. Baseline: HEP initiated Goal status: NOT MET    LONG TERM GOALS: Target date: 12/26/2023  Patient to be independent with advanced HEP. Baseline: Not yet initiated  Goal status: IN PROGRESS  Patient to demonstrate B LE strength >/=4+/5.  Baseline: See above Goal status: IN PROGRESS  Patient to demonstrate B dorsiflexion AROM to at least 10 degrees to improve efficiency of gait.  Baseline: 4, -3  deg with compensations  Goal status: IN PROGRESS  Patient to score at least 22/24 on DGI in order to decrease risk of falls.  Baseline: 19 Goal status: IN PROGRESS  Patient to demonstrate stair and curb navigation safely without handrail.   Baseline: step-to/through pattern with handrail Goal status: IN PROGRESS  Patient to demonstrate 5xSTS test in <15 sec in order to decrease risk of falls.  Baseline: 18.16 sec Goal status: IN PROGRESS  Patient to report plans to  maintain fitness regimen at home or in the community.  Baseline: pt reports sedentary lifestyle currently  Goal status: IN PROGRESS   ASSESSMENT:  CLINICAL IMPRESSION: Continued to work on progressive strengthening. Slightly limited due to increased pain today. Worked on improving balance and weight shift with rockerboard. Pt still remains more challenged with foam exercises.   OBJECTIVE IMPAIRMENTS: Abnormal gait, decreased activity tolerance, decreased balance, decreased endurance, decreased knowledge of use of DME, difficulty walking, decreased ROM, decreased strength, impaired flexibility, postural dysfunction, and pain.   ACTIVITY LIMITATIONS: carrying, lifting, bending, sitting, standing, squatting, sleeping, stairs, transfers, bathing, toileting, dressing, reach over head, hygiene/grooming, and locomotion level  PARTICIPATION LIMITATIONS: meal prep, shopping, community activity, and yard work  PERSONAL FACTORS: Age, Past/current experiences, Time since onset of injury/illness/exacerbation, and 3+ comorbidities: Asthma, CAD, DMII, HLD, HTN, SOB, R TSA 2023 are also affecting patient's functional outcome.   REHAB POTENTIAL: Good  CLINICAL DECISION MAKING: Evolving/moderate complexity  EVALUATION COMPLEXITY: Moderate  PLAN:  PT FREQUENCY: 2 x/week for 4 weeks followed by 1x/week for 4 weeks  PT DURATION: -  PLANNED INTERVENTIONS: 02835- PT Re-evaluation, 97110-Therapeutic exercises, 97530- Therapeutic activity, 97112- Neuromuscular re-education, 97535- Self Care, 02859- Manual therapy, [phone]- Gait training, [phone]- Canalith repositioning, [id number]- Electrical stimulation (manual), [phone] (1-2 muscles), 20561 (3+ muscles)- Dry Needling, Patient/Family education, Balance training, Stair training, Taping, Vestibular training, DME instructions, Cryotherapy, and Moist heat  PLAN FOR NEXT SESSION: review HEP for LE strengthening; work on stairs without handrail, if pt continues to be dizzy-  check orthostatics; goal is to transition to fitness regimen or group fitness class    Edelyn Heidel April Ma L [place], [place], DPT 11/29/23 4:17 PM  [facility] Outpatient Rehab at [facility] [address] [place], [place] [address] Phone # [phone] Fax # [phone]

## 2023-12-01 ENCOUNTER — Ambulatory Visit: Admitting: Physical Therapy

## 2023-12-01 ENCOUNTER — Encounter: Payer: Self-pay | Admitting: Physical Therapy

## 2023-12-01 DIAGNOSIS — M6281 Muscle weakness (generalized): Secondary | ICD-10-CM | POA: Diagnosis not present

## 2023-12-01 DIAGNOSIS — R2681 Unsteadiness on feet: Secondary | ICD-10-CM

## 2023-12-01 NOTE — Therapy (Signed)
 OUTPATIENT PHYSICAL THERAPY NEURO TREATMENT   Patient Name: Alan Wang MRN: [id number] DOB:01/01/1951, 73 y.o., male Today's Date: 12/01/2023   PCP: Frederik Charleston, MD  REFERRING PROVIDER: Rosario Leatrice FERNS, MD  END OF SESSION:  PT End of Session - 12/01/23 1621     Visit Number 6    Number of Visits 13    Date for Recertification  12/26/23    Authorization Type [employer]    PT Start Time 1617    PT Stop Time 1655    PT Time Calculation (min) 38 min    Equipment Utilized During Treatment Gait belt    Activity Tolerance Patient tolerated treatment well    Behavior During Therapy WFL for tasks assessed/performed             Past Medical History:  Diagnosis Date   Arthritis    Asthma    Cancer (HCC)    skin   Coronary artery disease    COVID    Diabetes mellitus    type 2, insulin  dependent for 30+ years   History of kidney stones 11/2012   Hyperlipemia    Hypertension    IDDM (insulin  dependent diabetes mellitus)    Shortness of breath    Stroke (HCC)    hx TIA   Past Surgical History:  Procedure Laterality Date   ANGIOPLASTY  04/06/2011   x3    CARDIAC CATHETERIZATION     CARDIAC CATHETERIZATION N/A 09/16/2015   Procedure: Left Heart Cath and Coronary Angiography;  Surgeon: Gordy Bergamo, MD;  Location: [facility] INVASIVE CV LAB;  Service: Cardiovascular;  Laterality: N/A;   cataracts     CHOLECYSTECTOMY     HERNIA REPAIR     x2   KNEE ARTHROSCOPY     LEFT HEART CATH AND CORONARY ANGIOGRAPHY N/A 01/19/2023   Procedure: LEFT HEART CATH AND CORONARY ANGIOGRAPHY;  Surgeon: Verlin Lonni JONETTA, MD;  Location: [facility] INVASIVE CV LAB;  Service: Cardiovascular;  Laterality: N/A;   LEFT HEART CATHETERIZATION WITH CORONARY ANGIOGRAM N/A 03/08/2011   Procedure: LEFT HEART CATHETERIZATION WITH CORONARY ANGIOGRAM;  Surgeon: Erick JONELLE Bergamo, MD;  Location: [facility] CATH LAB;  Service: Cardiovascular;  Laterality: N/A;   PERCUTANEOUS CORONARY STENT INTERVENTION (PCI-S)  N/A 04/06/2011   Procedure: PERCUTANEOUS CORONARY STENT INTERVENTION (PCI-S);  Surgeon: Erick JONELLE Bergamo, MD;  Location[facility] CATH LAB;  Service: Cardiovascular;  Laterality: N/A;   TOTAL SHOULDER ARTHROPLASTY Right 01/07/2022   Procedure: TOTAL SHOULDER ARTHROPLASTY;  Surgeon: Melita Drivers, MD;  Location: [facility] ORS;  Service: Orthopedics;  Laterality: Right;    Patient Active Problem List   Diagnosis Date Noted   Secondary erythrocytosis 10/11/2023   Acute ischemic stroke (HCC) 10/02/2023   Expressive aphasia 10/02/2023   Polycythemia vera (HCC) 09/26/2023   Malnutrition of moderate degree 12/06/2022   Acute on chronic systolic CHF (congestive heart failure) (HCC) 12/04/2022   Elevated troponin 12/04/2022   Acute metabolic encephalopathy 08/22/2022   Acute on chronic diastolic CHF (congestive heart failure) (HCC) 08/22/2022   Demand ischemia (HCC) 08/22/2022   COVID-19 08/21/2022   TIA (transient ischemic attack) 06/22/2016   DM2 (diabetes mellitus, type 2) (HCC) 06/22/2016   Primary osteoarthritis of both hands 06/03/2016   Primary osteoarthritis of both knees 06/03/2016   Generalized pain 06/03/2016   Memory difficulties 06/03/2016   History of diabetes mellitus 06/03/2016   History of coronary artery disease 06/03/2016   History of hypertension 06/03/2016   History of hypercholesterolemia 06/03/2016   High risk  medication use 06/03/2016   History of high cholesterol 06/03/2016   ANA positive 06/03/2016   Inflammatory arthritis 10/29/2015   Vitamin D deficiency 10/29/2015   Fatigue 09/24/2015   Vertigo 10/31/2013   Chronic cough 11/16/2012   CAD (coronary artery disease), native coronary artery 03/08/2011   Postsurgical percutaneous transluminal coronary angioplasty (PTCA) status 03/08/2011   Insulin  dependent type 2 diabetes mellitus (HCC) 03/08/2011   Essential hypertension 03/08/2011   Shortness of breath 03/08/2011   Mixed hyperlipidemia 03/08/2011    ONSET DATE:  10/01/23  REFERRING DIAG: R53.1 (ICD-10-CM) - Weakness  THERAPY DIAG:  Muscle weakness (generalized)  Unsteadiness on feet  Rationale for Evaluation and Treatment: Rehabilitation  SUBJECTIVE:                                                                                                                                                                                             SUBJECTIVE STATEMENT: Pt reports increased general pain today. Just a little soreness after last session.   Pt accompanied by: significant other in waiting room   PERTINENT HISTORY: Asthma, CAD, DMII, HLD, HTN, SOB, R TSA 2023  PAIN:  Are you having pain? Yes: NPRS scale: 4-5/10 Pain location: generalized pain Pain description: ache Aggravating factors: pt suspects medication side effects Relieving factors: nothing  PRECAUTIONS: Other: R shoulder pain limiting activity   RED FLAGS: None   WEIGHT BEARING RESTRICTIONS: No  FALLS: Has patient fallen in last 6 months? No  LIVING ENVIRONMENT: Lives with: lives with their spouse Lives in: House/apartment Stairs: 3 +1 steps, 2 story home; has stair lift which pt reports awkward/dizzy going up stairs without it Has following equipment at home: Single point cane, Walker - 2 wheeled, Environmental consultant - 4 wheeled, shower chair, and Grab bars  PLOF: Independent with basic ADLs and reports difficulty bathing/dressing d/t R shoulder pain  PATIENT GOALS: pt does not report  OBJECTIVE:   TODAY'S TREATMENT: 11/29/23 Activity Comments  Nustep L4-L8 x 10 min bilat LEs/UEs Intermittent increase/decrease of resistance for HIIT  Standing runner's step up 6 2x1 min Side step up 6 2x1 min Heel/toe raise on step x10 Intermittent UE support  Resisted walking fwd, bwd, R&L x10 each 15#  Cues to keep from trunk lean and eccentric control               HOME EXERCISE PROGRAM Last updated: 11/17/23 Access Code: [email or web address] URL: [email or web address] Date:  11/17/2023 Prepared by: [facility] - Outpatient  Rehab - [facility]  Program Notes get a pair of adjustable ankle weights ranging 1-5lbs   Exercises - Sit to Stand Without Arm Support  -  1 x daily - 5 x weekly - 2 sets - 10 reps - Seated Long Arc Quad  - 1 x daily - 5 x weekly - 2 sets - 10 reps - Standing March with Counter Support  - 1 x daily - 5 x weekly - 2 sets - 10 reps - Standing Hip Abduction with Counter Support  - 1 x daily - 5 x weekly - 2 sets - 10 reps    PATIENT EDUCATION: Education details: HEP Person educated: Patient Education method: Programmer, multimedia, Demonstration, Tactile cues, Verbal cues, and Handouts Education comprehension: verbalized understanding and returned demonstration        Note: Objective measures were completed at Evaluation unless otherwise noted.  DIAGNOSTIC FINDINGS: 10/02/23 brain MRI: New focal area of susceptibility in the lateral aspect of the left thalamus is compatible with and interval punctate hemorrhage. This may be acute and related to the patient's symptoms . 2. Mildly advanced periventricular and subcortical T2 hyperintensities, similar to the prior exam. 3. Stable remote lacunar infarcts of the cerebellum.   COGNITION: Overall cognitive status: Within functional limits for tasks assessed   SENSATION: Pt denies N/T in UEs/LEs   COORDINATION: Alternating pronation/supination: WNL B Alternating toe tap: WNL B Finger to nose: slowed B   MUSCLE TONE: WNL B LEs   POSTURE: rounded shoulders and forward head, flexed trunk and frequently looking down   LOWER EXTREMITY ROM:     Active  Right Eval Left Eval  Hip flexion    Hip extension    Hip abduction    Hip adduction    Hip internal rotation    Hip external rotation    Knee flexion    Knee extension    Ankle dorsiflexion 4 deg with knee bent -3 with bent knee  Ankle plantarflexion    Ankle inversion    Ankle eversion     (Blank rows = not tested)  LOWER  EXTREMITY MMT:    MMT Right Eval Left Eval  Hip flexion 3+ 3+  Hip extension    Hip abduction 4 4-  Hip adduction 4+ 4+  Hip internal rotation    Hip external rotation    Knee flexion 3+ 3+  Knee extension 4- 4  Ankle dorsiflexion 4 4  Ankle plantarflexion 4 4  Ankle inversion    Ankle eversion    (Blank rows = not tested)  GAIT: Findings: Assistive device utilized:None, Level of assistance: Modified independence, and Comments: slowed speed  FUNCTIONAL TESTS:  5 times sit to stand: 18.16 sec without UEs  Timed up and go (TUG): 11.9 sec  Dynamic Gait Index: 19/24            GOALS: Goals reviewed with patient? Yes  SHORT TERM GOALS: Target date: 11/28/2023  Patient to be independent with initial HEP. Baseline: HEP initiated Goal status: NOT MET    LONG TERM GOALS: Target date: 12/26/2023  Patient to be independent with advanced HEP. Baseline: Not yet initiated  Goal status: IN PROGRESS  Patient to demonstrate B LE strength >/=4+/5.  Baseline: See above Goal status: IN PROGRESS  Patient to demonstrate B dorsiflexion AROM to at least 10 degrees to improve efficiency of gait.  Baseline: 4, -3 deg with compensations  Goal status: IN PROGRESS  Patient to score at least 22/24 on DGI in order to decrease risk of falls.  Baseline: 19 Goal status: IN PROGRESS  Patient to demonstrate stair and curb navigation safely without handrail.   Baseline: step-to/through pattern with  handrail Goal status: IN PROGRESS  Patient to demonstrate 5xSTS test in <15 sec in order to decrease risk of falls.  Baseline: 18.16 sec Goal status: IN PROGRESS  Patient to report plans to maintain fitness regimen at home or in the community.  Baseline: pt reports sedentary lifestyle currently  Goal status: IN PROGRESS   ASSESSMENT:  CLINICAL IMPRESSION: Continued to work on progressive strengthening, reactive/stepping balance, and weight shifting. Pt tolerated well. Challenged  with eccentric control during resisted walking exercise. Difficulty performing single leg stability with runner's step up without UE assist.   OBJECTIVE IMPAIRMENTS: Abnormal gait, decreased activity tolerance, decreased balance, decreased endurance, decreased knowledge of use of DME, difficulty walking, decreased ROM, decreased strength, impaired flexibility, postural dysfunction, and pain.   ACTIVITY LIMITATIONS: carrying, lifting, bending, sitting, standing, squatting, sleeping, stairs, transfers, bathing, toileting, dressing, reach over head, hygiene/grooming, and locomotion level  PARTICIPATION LIMITATIONS: meal prep, shopping, community activity, and yard work  PERSONAL FACTORS: Age, Past/current experiences, Time since onset of injury/illness/exacerbation, and 3+ comorbidities: Asthma, CAD, DMII, HLD, HTN, SOB, R TSA 2023 are also affecting patient's functional outcome.   REHAB POTENTIAL: Good  CLINICAL DECISION MAKING: Evolving/moderate complexity  EVALUATION COMPLEXITY: Moderate  PLAN:  PT FREQUENCY: 2 x/week for 4 weeks followed by 1x/week for 4 weeks  PT DURATION: -  PLANNED INTERVENTIONS: 02835- PT Re-evaluation, 97110-Therapeutic exercises, 97530- Therapeutic activity, 97112- Neuromuscular re-education, 97535- Self Care, 02859- Manual therapy, [phone]- Gait training, [phone]- Canalith repositioning, [id number]- Electrical stimulation (manual), [phone] (1-2 muscles), [phone] (3+ muscles)- Dry Needling, Patient/Family education, Balance training, Stair training, Taping, Vestibular training, DME instructions, Cryotherapy, and Moist heat  PLAN FOR NEXT SESSION: review HEP for LE strengthening; work on stairs without handrail, if pt continues to be dizzy- check orthostatics; goal is to transition to fitness regimen or group fitness class    Thorn Demas April Ma L [place], [place], DPT 12/01/23 4:21 PM  [facility] at [facility] [address] [place], [place]  [address] Phone # [phone] Fax # [phone]

## 2023-12-05 NOTE — Therapy (Signed)
 OUTPATIENT PHYSICAL THERAPY NEURO TREATMENT   Patient Name: Alan Wang MRN: [id number] DOB:[birth year], 73 y.o., male Today's Date: 12/06/2023   PCP: Frederik Charleston, MD  REFERRING PROVIDER: Rosario Leatrice FERNS, MD  END OF SESSION:  PT End of Session - 12/06/23 1659     Visit Number 7    Number of Visits 13    Date for Recertification  12/26/23    Authorization Type [employer]    PT Start Time 1618    PT Stop Time 1700    PT Time Calculation (min) 42 min    Equipment Utilized During Treatment Gait belt    Activity Tolerance Patient tolerated treatment well    Behavior During Therapy WFL for tasks assessed/performed              Past Medical History:  Diagnosis Date   Arthritis    Asthma    Cancer (HCC)    skin   Coronary artery disease    COVID    Diabetes mellitus    type 2, insulin  dependent for 30+ years   History of kidney stones 11/2012   Hyperlipemia    Hypertension    IDDM (insulin  dependent diabetes mellitus)    Shortness of breath    Stroke (HCC)    hx TIA   Past Surgical History:  Procedure Laterality Date   ANGIOPLASTY  04/06/2011   x3    CARDIAC CATHETERIZATION     CARDIAC CATHETERIZATION N/A 09/16/2015   Procedure: Left Heart Cath and Coronary Angiography;  Surgeon: Gordy Bergamo, MD;  Location: [facility] INVASIVE CV LAB;  Service: Cardiovascular;  Laterality: N/A;   cataracts     CHOLECYSTECTOMY     HERNIA REPAIR     x2   KNEE ARTHROSCOPY     LEFT HEART CATH AND CORONARY ANGIOGRAPHY N/A 01/19/2023   Procedure: LEFT HEART CATH AND CORONARY ANGIOGRAPHY;  Surgeon: Verlin Lonni JONETTA, MD;  Location: [facility] INVASIVE CV LAB;  Service: Cardiovascular;  Laterality: N/A;   LEFT HEART CATHETERIZATION WITH CORONARY ANGIOGRAM N/A 03/08/2011   Procedure: LEFT HEART CATHETERIZATION WITH CORONARY ANGIOGRAM;  Surgeon: Erick JONELLE Bergamo, MD;  Location: [facility] CATH LAB;  Service: Cardiovascular;  Laterality: N/A;   PERCUTANEOUS CORONARY STENT INTERVENTION (PCI-S)  N/A 04/06/2011   Procedure: PERCUTANEOUS CORONARY STENT INTERVENTION (PCI-S);  Surgeon: Erick JONELLE Bergamo, MD;  Location: [facility] CATH LAB;  Service: Cardiovascular;  Laterality: N/A;   TOTAL SHOULDER ARTHROPLASTY Right 01/07/2022   Procedure: TOTAL SHOULDER ARTHROPLASTY;  Surgeon: Melita Drivers, MD;  Location: [facility] ORS;  Service: Orthopedics;  Laterality: Right;    Patient Active Problem List   Diagnosis Date Noted   Secondary erythrocytosis 10/11/2023   Acute ischemic stroke (HCC) 10/02/2023   Expressive aphasia 10/02/2023   Polycythemia vera (HCC) 09/26/2023   Malnutrition of moderate degree 12/06/2022   Acute on chronic systolic CHF (congestive heart failure) (HCC) 12/04/2022   Elevated troponin 12/04/2022   Acute metabolic encephalopathy 08/22/2022   Acute on chronic diastolic CHF (congestive heart failure) (HCC) 08/22/2022   Demand ischemia (HCC) 08/22/2022   COVID-19 08/21/2022   TIA (transient ischemic attack) 06/22/2016   DM2 (diabetes mellitus, type 2) (HCC) 06/22/2016   Primary osteoarthritis of both hands 06/03/2016   Primary osteoarthritis of both knees 06/03/2016   Generalized pain 06/03/2016   Memory difficulties 06/03/2016   History of diabetes mellitus 06/03/2016   History of coronary artery disease 06/03/2016   History of hypertension 06/03/2016   History of hypercholesterolemia 06/03/2016   High  risk medication use 06/03/2016   History of high cholesterol 06/03/2016   ANA positive 06/03/2016   Inflammatory arthritis 10/29/2015   Vitamin D deficiency 10/29/2015   Fatigue 09/24/2015   Vertigo 10/31/2013   Chronic cough 11/16/2012   CAD (coronary artery disease), native coronary artery 03/08/2011   Postsurgical percutaneous transluminal coronary angioplasty (PTCA) status 03/08/2011   Insulin  dependent type 2 diabetes mellitus (HCC) 03/08/2011   Essential hypertension 03/08/2011   Shortness of breath 03/08/2011   Mixed hyperlipidemia 03/08/2011    ONSET DATE:  10/01/23  REFERRING DIAG: R53.1 (ICD-10-CM) - Weakness  THERAPY DIAG:  Muscle weakness (generalized)  Unsteadiness on feet  Rationale for Evaluation and Treatment: Rehabilitation  SUBJECTIVE:                                                                                                                                                                                             SUBJECTIVE STATEMENT: Reports the R shoulder is still hurting.   Pt accompanied by: significant other in waiting room   PERTINENT HISTORY: Asthma, CAD, DMII, HLD, HTN, SOB, R TSA 2023  PAIN:  Are you having pain? Yes: NPRS scale: 3/10 Pain location: R shoulder Pain description: ache Aggravating factors: pt suspects medication side effects Relieving factors: nothing  PRECAUTIONS: Other: R shoulder pain limiting activity   RED FLAGS: None   WEIGHT BEARING RESTRICTIONS: No  FALLS: Has patient fallen in last 6 months? No  LIVING ENVIRONMENT: Lives with: lives with their spouse Lives in: House/apartment Stairs: 3 +1 steps, 2 story home; has stair lift which pt reports awkward/dizzy going up stairs without it Has following equipment at home: Single point cane, Walker - 2 wheeled, Environmental consultant - 4 wheeled, shower chair, and Grab bars  PLOF: Independent with basic ADLs and reports difficulty bathing/dressing d/t R shoulder pain  PATIENT GOALS: pt does not report  OBJECTIVE:     TODAY'S TREATMENT: 12/06/23 Activity Comments  Nustep L5 x 7 min LEs/L UE Dynamic warmup   monster walks ant/pos red TB In II bars; edu on proper alignment, step length backwards. 1 UE support required  Sidestepping with red loop around ankles 2x 1 min In II bars; small shuffling steps   high knee march with kettlebell in L hand 15#  CGA-min A d/t imbalance. More imbalance on R LE  alt UE swing with high knee march  CGA-min A d/t imbalance. Difficulty coordinating, thus performed in II bars first. Required cues to stop and reset    Stair navigation  With 1 UE, then no UE with CGA and mild imbalance without UE support   Side Steps  10x each  LE weakness, CGA when performing without UE support      PATIENT EDUCATION: Education details: HEP update with edu to perform step up at counter for safety Person educated: Patient Education method: Explanation, Demonstration, Tactile cues, Verbal cues, and Handouts Education comprehension: verbalized understanding and returned demonstration    HOME EXERCISE PROGRAM Access Code: [email or web address] URL: [email or web address] Date: 12/06/2023 Prepared by: [facility] - Outpatient  Rehab - [facility]  Program Notes get a pair of adjustable ankle weights ranging 1-5lbs   Exercises - Sit to Stand Without Arm Support  - 1 x daily - 5 x weekly - 2 sets - 10 reps - Seated Long Arc Quad  - 1 x daily - 5 x weekly - 2 sets - 10 reps - Standing March with Counter Support  - 1 x daily - 5 x weekly - 2 sets - 10 reps - Standing Hip Abduction with Counter Support  - 1 x daily - 5 x weekly - 2 sets - 10 reps - Seated Knee Extension with Resistance  - 1 x daily - 7 x weekly - 2 sets - 10 reps - Seated Hamstring Curls with Resistance  - 1 x daily - 7 x weekly - 2 sets - 10 reps - Standing 3-Way Leg Reach with Resistance at Ankles and Counter Support  - 1 x daily - 7 x weekly - 2 sets - 10 reps - Forward Step Up with Counter Support  - 1 x daily - 5 x weekly - 2 sets - 10 reps    PATIENT EDUCATION: Education details: HEP Person educated: Patient Education method: Explanation, Demonstration, Tactile cues, Verbal cues, and Handouts Education comprehension: verbalized understanding and returned demonstration        Note: Objective measures were completed at Evaluation unless otherwise noted.  DIAGNOSTIC FINDINGS: 10/02/23 brain MRI: New focal area of susceptibility in the lateral aspect of the left thalamus is compatible with and interval punctate hemorrhage. This may be  acute and related to the patient's symptoms . 2. Mildly advanced periventricular and subcortical T2 hyperintensities, similar to the prior exam. 3. Stable remote lacunar infarcts of the cerebellum.   COGNITION: Overall cognitive status: Within functional limits for tasks assessed   SENSATION: Pt denies N/T in UEs/LEs   COORDINATION: Alternating pronation/supination: WNL B Alternating toe tap: WNL B Finger to nose: slowed B   MUSCLE TONE: WNL B LEs   POSTURE: rounded shoulders and forward head, flexed trunk and frequently looking down   LOWER EXTREMITY ROM:     Active  Right Eval Left Eval  Hip flexion    Hip extension    Hip abduction    Hip adduction    Hip internal rotation    Hip external rotation    Knee flexion    Knee extension    Ankle dorsiflexion 4 deg with knee bent -3 with bent knee  Ankle plantarflexion    Ankle inversion    Ankle eversion     (Blank rows = not tested)  LOWER EXTREMITY MMT:    MMT Right Eval Left Eval  Hip flexion 3+ 3+  Hip extension    Hip abduction 4 4-  Hip adduction 4+ 4+  Hip internal rotation    Hip external rotation    Knee flexion 3+ 3+  Knee extension 4- 4  Ankle dorsiflexion 4 4  Ankle plantarflexion 4 4  Ankle inversion    Ankle eversion    (Blank rows = not tested)  GAIT: Findings: Assistive device utilized:None, Level of assistance: Modified independence, and Comments: slowed speed  FUNCTIONAL TESTS:  5 times sit to stand: 18.16 sec without UEs  Timed up and go (TUG): 11.9 sec  Dynamic Gait Index: 19/24            GOALS: Goals reviewed with patient? Yes  SHORT TERM GOALS: Target date: 11/28/2023  Patient to be independent with initial HEP. Baseline: HEP initiated Goal status: NOT MET    LONG TERM GOALS: Target date: 12/26/2023  Patient to be independent with advanced HEP. Baseline: Not yet initiated  Goal status: IN PROGRESS  Patient to demonstrate B LE strength >/=4+/5.  Baseline:  See above Goal status: IN PROGRESS  Patient to demonstrate B dorsiflexion AROM to at least 10 degrees to improve efficiency of gait.  Baseline: 4, -3 deg with compensations  Goal status: IN PROGRESS  Patient to score at least 22/24 on DGI in order to decrease risk of falls.  Baseline: 19 Goal status: IN PROGRESS  Patient to demonstrate stair and curb navigation safely without handrail.   Baseline: step-to/through pattern with handrail Goal status: IN PROGRESS  Patient to demonstrate 5xSTS test in <15 sec in order to decrease risk of falls.  Baseline: 18.16 sec Goal status: IN PROGRESS  Patient to report plans to maintain fitness regimen at home or in the community.  Baseline: pt reports sedentary lifestyle currently  Goal status: IN PROGRESS   ASSESSMENT:  CLINICAL IMPRESSION: Patient arrived to session without new complaints. Worked on progressive LE strengthening activities with cueing for proper form, alignment. Imbalance evident with weighted marching, with tendency for R LOB. Continued working on stairs as pt reports that he continues to avoid stair navigation at home d/t fear of falling. Pt does demo some imbalance without UE support but appears safe with use of single railing. HEP was updated to increase exposure to this exercise. No complaints at end of session.   OBJECTIVE IMPAIRMENTS: Abnormal gait, decreased activity tolerance, decreased balance, decreased endurance, decreased knowledge of use of DME, difficulty walking, decreased ROM, decreased strength, impaired flexibility, postural dysfunction, and pain.   ACTIVITY LIMITATIONS: carrying, lifting, bending, sitting, standing, squatting, sleeping, stairs, transfers, bathing, toileting, dressing, reach over head, hygiene/grooming, and locomotion level  PARTICIPATION LIMITATIONS: meal prep, shopping, community activity, and yard work  PERSONAL FACTORS: Age, Past/current experiences, Time since onset of  injury/illness/exacerbation, and 3+ comorbidities: Asthma, CAD, DMII, HLD, HTN, SOB, R TSA 2023 are also affecting patient's functional outcome.   REHAB POTENTIAL: Good  CLINICAL DECISION MAKING: Evolving/moderate complexity  EVALUATION COMPLEXITY: Moderate  PLAN:  PT FREQUENCY: 2 x/week for 4 weeks followed by 1x/week for 4 weeks  PT DURATION: -  PLANNED INTERVENTIONS: [id number]- PT Re-evaluation, 97110-Therapeutic exercises, 97530- Therapeutic activity, 97112- Neuromuscular re-education, 97535- Self Care, [id number]- Manual therapy, [phone]- Gait training, [phone]- Canalith repositioning, [id number]- Electrical stimulation (manual), [phone] (1-2 muscles), [id number] (3+ muscles)- Dry Needling, Patient/Family education, Balance training, Stair training, Taping, Vestibular training, DME instructions, Cryotherapy, and Moist heat  PLAN FOR NEXT SESSION: review HEP for LE strengthening; work on stairs without handrail, if pt continues to be dizzy- check orthostatics; goal is to transition to fitness regimen or group fitness class    Louana Terrilyn Christians, [place], DPT 12/06/23 5:06 PM  [facility] [address], [address] [place], [place] [address] Phone # [phone] Fax # [phone]

## 2023-12-06 ENCOUNTER — Encounter: Payer: Self-pay | Admitting: Physical Therapy

## 2023-12-06 ENCOUNTER — Ambulatory Visit: Admitting: Physical Therapy

## 2023-12-06 DIAGNOSIS — E7800 Pure hypercholesterolemia, unspecified: Secondary | ICD-10-CM | POA: Diagnosis not present

## 2023-12-06 DIAGNOSIS — E1165 Type 2 diabetes mellitus with hyperglycemia: Secondary | ICD-10-CM | POA: Diagnosis not present

## 2023-12-06 DIAGNOSIS — M6281 Muscle weakness (generalized): Secondary | ICD-10-CM

## 2023-12-06 DIAGNOSIS — I10 Essential (primary) hypertension: Secondary | ICD-10-CM | POA: Diagnosis not present

## 2023-12-06 DIAGNOSIS — R2681 Unsteadiness on feet: Secondary | ICD-10-CM

## 2023-12-06 DIAGNOSIS — E11319 Type 2 diabetes mellitus with unspecified diabetic retinopathy without macular edema: Secondary | ICD-10-CM | POA: Diagnosis not present

## 2023-12-06 DIAGNOSIS — Z23 Encounter for immunization: Secondary | ICD-10-CM | POA: Diagnosis not present

## 2023-12-06 DIAGNOSIS — D1779 Benign lipomatous neoplasm of other sites: Secondary | ICD-10-CM | POA: Diagnosis not present

## 2023-12-08 ENCOUNTER — Ambulatory Visit: Attending: Internal Medicine

## 2023-12-08 DIAGNOSIS — R2681 Unsteadiness on feet: Secondary | ICD-10-CM | POA: Insufficient documentation

## 2023-12-08 DIAGNOSIS — M6281 Muscle weakness (generalized): Secondary | ICD-10-CM | POA: Diagnosis not present

## 2023-12-08 NOTE — Therapy (Signed)
 OUTPATIENT PHYSICAL THERAPY NEURO TREATMENT   Patient Name: Alan Wang MRN: [id number] DOB:01-26-1951, 73 y.o., male Today's Date: 12/08/2023   PCP: Frederik Charleston, MD  REFERRING PROVIDER: Rosario Leatrice FERNS, MD  END OF SESSION:  PT End of Session - 12/08/23 1605     Visit Number 8    Number of Visits 13    Date for Recertification  12/26/23    Authorization Type [employer]    PT Start Time 1615    PT Stop Time 1700    PT Time Calculation (min) 45 min    Equipment Utilized During Treatment Gait belt    Activity Tolerance Patient tolerated treatment well    Behavior During Therapy [facility] for tasks assessed/performed              Past Medical History:  Diagnosis Date   Arthritis    Asthma    Cancer (HCC)    skin   Coronary artery disease    COVID    Diabetes mellitus    type 2, insulin  dependent for 30+ years   History of kidney stones 11/2012   Hyperlipemia    Hypertension    IDDM (insulin  dependent diabetes mellitus)    Shortness of breath    Stroke (HCC)    hx TIA   Past Surgical History:  Procedure Laterality Date   ANGIOPLASTY  04/06/2011   x3    CARDIAC CATHETERIZATION     CARDIAC CATHETERIZATION N/A 09/16/2015   Procedure: Left Heart Cath and Coronary Angiography;  Surgeon: Gordy Bergamo, MD;  Location: [facility] INVASIVE CV LAB;  Service: Cardiovascular;  Laterality: N/A;   cataracts     CHOLECYSTECTOMY     HERNIA REPAIR     x2   KNEE ARTHROSCOPY     LEFT HEART CATH AND CORONARY ANGIOGRAPHY N/A 01/19/2023   Procedure: LEFT HEART CATH AND CORONARY ANGIOGRAPHY;  Surgeon: Verlin Lonni JONETTA, MD;  Location: [facility] INVASIVE CV LAB;  Service: Cardiovascular;  Laterality: N/A;   LEFT HEART CATHETERIZATION WITH CORONARY ANGIOGRAM N/A 03/08/2011   Procedure: LEFT HEART CATHETERIZATION WITH CORONARY ANGIOGRAM;  Surgeon: Erick JONELLE Bergamo, MD;  Location: [facility] CATH LAB;  Service: Cardiovascular;  Laterality: N/A;   PERCUTANEOUS CORONARY STENT INTERVENTION (PCI-S)  N/A 04/06/2011   Procedure: PERCUTANEOUS CORONARY STENT INTERVENTION (PCI-S);  Surgeon: Erick JONELLE Bergamo, MD;  Location: [facility] CATH LAB;  Service: Cardiovascular;  Laterality: N/A;   TOTAL SHOULDER ARTHROPLASTY Right 01/07/2022   Procedure: TOTAL SHOULDER ARTHROPLASTY;  Surgeon: Melita Drivers, MD;  Location: [facility] ORS;  Service: Orthopedics;  Laterality: Right;    Patient Active Problem List   Diagnosis Date Noted   Secondary erythrocytosis 10/11/2023   Acute ischemic stroke (HCC) 10/02/2023   Expressive aphasia 10/02/2023   Polycythemia vera (HCC) 09/26/2023   Malnutrition of moderate degree 12/06/2022   Acute on chronic systolic CHF (congestive heart failure) (HCC) 12/04/2022   Elevated troponin 12/04/2022   Acute metabolic encephalopathy 08/22/2022   Acute on chronic diastolic CHF (congestive heart failure) (HCC) 08/22/2022   Demand ischemia (HCC) 08/22/2022   COVID-19 08/21/2022   TIA (transient ischemic attack) 06/22/2016   DM2 (diabetes mellitus, type 2) (HCC) 06/22/2016   Primary osteoarthritis of both hands 06/03/2016   Primary osteoarthritis of both knees 06/03/2016   Generalized pain 06/03/2016   Memory difficulties 06/03/2016   History of diabetes mellitus 06/03/2016   History of coronary artery disease 06/03/2016   History of hypertension 06/03/2016   History of hypercholesterolemia 06/03/2016   High  risk medication use 06/03/2016   History of high cholesterol 06/03/2016   ANA positive 06/03/2016   Inflammatory arthritis 10/29/2015   Vitamin D deficiency 10/29/2015   Fatigue 09/24/2015   Vertigo 10/31/2013   Chronic cough 11/16/2012   CAD (coronary artery disease), native coronary artery 03/08/2011   Postsurgical percutaneous transluminal coronary angioplasty (PTCA) status 03/08/2011   Insulin  dependent type 2 diabetes mellitus (HCC) 03/08/2011   Essential hypertension 03/08/2011   Shortness of breath 03/08/2011   Mixed hyperlipidemia 03/08/2011    ONSET DATE:  10/01/23  REFERRING DIAG: R53.1 (ICD-10-CM) - Weakness  THERAPY DIAG:  Muscle weakness (generalized)  Unsteadiness on feet  Rationale for Evaluation and Treatment: Rehabilitation  SUBJECTIVE:                                                                                                                                                                                             SUBJECTIVE STATEMENT: Noting issue is more of imbalance rather than feeling swimmyheaded  Pt accompanied by: significant other in waiting room   PERTINENT HISTORY: Asthma, CAD, DMII, HLD, HTN, SOB, R TSA 2023  PAIN:  Are you having pain? Yes: NPRS scale: 3/10 Pain location: R shoulder Pain description: ache Aggravating factors: pt suspects medication side effects Relieving factors: nothing  PRECAUTIONS: Other: R shoulder pain limiting activity   RED FLAGS: None   WEIGHT BEARING RESTRICTIONS: No  FALLS: Has patient fallen in last 6 months? No  LIVING ENVIRONMENT: Lives with: lives with their spouse Lives in: House/apartment Stairs: 3 +1 steps, 2 story home; has stair lift which pt reports awkward/dizzy going up stairs without it Has following equipment at home: Single point cane, Walker - 2 wheeled, Environmental consultant - 4 wheeled, shower chair, and Grab bars  PLOF: Independent with basic ADLs and reports difficulty bathing/dressing d/t R shoulder pain  PATIENT GOALS: pt does not report  OBJECTIVE:    TODAY'S TREATMENT: 12/08/23 Activity Comments  Static multisensory balance   LE PRE: 3 rounds 40 sec work, 20 sec rest -LAQ 4# -alt stair taps 4# -sidestepping 4# -hip abduction, no weight                  TODAY'S TREATMENT: 12/06/23 Activity Comments  Nustep L5 x 7 min LEs/L UE Dynamic warmup   monster walks ant/pos red TB In II bars; edu on proper alignment, step length backwards. 1 UE support required  Sidestepping with red loop around ankles 2x 1 min In II bars; small shuffling steps   high  knee march with kettlebell in L hand 15#  CGA-min A d/t imbalance. More imbalance on R  LE  alt UE swing with high knee march  CGA-min A d/t imbalance. Difficulty coordinating, thus performed in II bars first. Required cues to stop and reset   Stair navigation  With 1 UE, then no UE with CGA and mild imbalance without UE support   Side Steps 10x each  LE weakness, CGA when performing without UE support      PATIENT EDUCATION: Education details: HEP update with edu to perform step up at counter for safety Person educated: Patient Education method: Explanation, Demonstration, Tactile cues, Verbal cues, and Handouts Education comprehension: verbalized understanding and returned demonstration    HOME EXERCISE PROGRAM Access Code: [email or web address] URL: [email or web address] Date: 12/06/2023 Prepared by: [facility] - Outpatient  Rehab - [facility]  Program Notes get a pair of adjustable ankle weights ranging 1-5lbs   Exercises - Sit to Stand Without Arm Support  - 1 x daily - 5 x weekly - 2 sets - 10 reps - Seated Long Arc Quad  - 1 x daily - 5 x weekly - 2 sets - 10 reps - Standing March with Counter Support  - 1 x daily - 5 x weekly - 2 sets - 10 reps - Standing Hip Abduction with Counter Support  - 1 x daily - 5 x weekly - 2 sets - 10 reps - Seated Knee Extension with Resistance  - 1 x daily - 7 x weekly - 2 sets - 10 reps - Seated Hamstring Curls with Resistance  - 1 x daily - 7 x weekly - 2 sets - 10 reps - Standing 3-Way Leg Reach with Resistance at Ankles and Counter Support  - 1 x daily - 7 x weekly - 2 sets - 10 reps - Forward Step Up with Counter Support  - 1 x daily - 5 x weekly - 2 sets - 10 reps    PATIENT EDUCATION: Education details: HEP Person educated: Patient Education method: Explanation, Demonstration, Tactile cues, Verbal cues, and Handouts Education comprehension: verbalized understanding and returned demonstration        Note: Objective  measures were completed at Evaluation unless otherwise noted.  DIAGNOSTIC FINDINGS: 10/02/23 brain MRI: New focal area of susceptibility in the lateral aspect of the left thalamus is compatible with and interval punctate hemorrhage. This may be acute and related to the patient's symptoms . 2. Mildly advanced periventricular and subcortical T2 hyperintensities, similar to the prior exam. 3. Stable remote lacunar infarcts of the cerebellum.   COGNITION: Overall cognitive status: Within functional limits for tasks assessed   SENSATION: Pt denies N/T in UEs/LEs   COORDINATION: Alternating pronation/supination: WNL B Alternating toe tap: WNL B Finger to nose: slowed B   MUSCLE TONE: WNL B LEs   POSTURE: rounded shoulders and forward head, flexed trunk and frequently looking down   LOWER EXTREMITY ROM:     Active  Right Eval Left Eval  Hip flexion    Hip extension    Hip abduction    Hip adduction    Hip internal rotation    Hip external rotation    Knee flexion    Knee extension    Ankle dorsiflexion 4 deg with knee bent -3 with bent knee  Ankle plantarflexion    Ankle inversion    Ankle eversion     (Blank rows = not tested)  LOWER EXTREMITY MMT:    MMT Right Eval Left Eval  Hip flexion 3+ 3+  Hip extension    Hip abduction 4 4-  Hip  adduction 4+ 4+  Hip internal rotation    Hip external rotation    Knee flexion 3+ 3+  Knee extension 4- 4  Ankle dorsiflexion 4 4  Ankle plantarflexion 4 4  Ankle inversion    Ankle eversion    (Blank rows = not tested)  GAIT: Findings: Assistive device utilized:None, Level of assistance: Modified independence, and Comments: slowed speed  FUNCTIONAL TESTS:  5 times sit to stand: 18.16 sec without UEs  Timed up and go (TUG): 11.9 sec  Dynamic Gait Index: 19/24            GOALS: Goals reviewed with patient? Yes  SHORT TERM GOALS: Target date: 11/28/2023  Patient to be independent with initial HEP. Baseline:  HEP initiated Goal status: NOT MET    LONG TERM GOALS: Target date: 12/26/2023  Patient to be independent with advanced HEP. Baseline: Not yet initiated  Goal status: IN PROGRESS  Patient to demonstrate B LE strength >/=4+/5.  Baseline: See above Goal status: IN PROGRESS  Patient to demonstrate B dorsiflexion AROM to at least 10 degrees to improve efficiency of gait.  Baseline: 4, -3 deg with compensations  Goal status: IN PROGRESS  Patient to score at least 22/24 on DGI in order to decrease risk of falls.  Baseline: 19 Goal status: IN PROGRESS  Patient to demonstrate stair and curb navigation safely without handrail.   Baseline: step-to/through pattern with handrail Goal status: IN PROGRESS  Patient to demonstrate 5xSTS test in <15 sec in order to decrease risk of falls.  Baseline: 18.16 sec Goal status: IN PROGRESS  Patient to report plans to maintain fitness regimen at home or in the community.  Baseline: pt reports sedentary lifestyle currently  Goal status: IN PROGRESS   ASSESSMENT:  CLINICAL IMPRESSION: Static multisensory balance to facilitate postural control and proprioceptive awareness. LE PRE with emphasis on work:rest ratio to improve endurance for functional activity tolerance.  Good activity tolerance demonstrated throughout with ability to progress to different PRE without rest periods between intervals.   OBJECTIVE IMPAIRMENTS: Abnormal gait, decreased activity tolerance, decreased balance, decreased endurance, decreased knowledge of use of DME, difficulty walking, decreased ROM, decreased strength, impaired flexibility, postural dysfunction, and pain.   ACTIVITY LIMITATIONS: carrying, lifting, bending, sitting, standing, squatting, sleeping, stairs, transfers, bathing, toileting, dressing, reach over head, hygiene/grooming, and locomotion level  PARTICIPATION LIMITATIONS: meal prep, shopping, community activity, and yard work  PERSONAL FACTORS: Age,  Past/current experiences, Time since onset of injury/illness/exacerbation, and 3+ comorbidities: Asthma, CAD, DMII, HLD, HTN, SOB, R TSA 2023 are also affecting patient's functional outcome.   REHAB POTENTIAL: Good  CLINICAL DECISION MAKING: Evolving/moderate complexity  EVALUATION COMPLEXITY: Moderate  PLAN:  PT FREQUENCY: 2 x/week for 4 weeks followed by 1x/week for 4 weeks  PT DURATION: -  PLANNED INTERVENTIONS: 02835- PT Re-evaluation, 97110-Therapeutic exercises, 97530- Therapeutic activity, [id number]- Neuromuscular re-education, 97535- Self Care, [id number]- Manual therapy, [id number]- Gait training, [phone]- Canalith repositioning, [id number]- Electrical stimulation (manual), [phone] (1-2 muscles), [id number] (3+ muscles)- Dry Needling, Patient/Family education, Balance training, Stair training, Taping, Vestibular training, DME instructions, Cryotherapy, and Moist heat  PLAN FOR NEXT SESSION: review HEP for LE strengthening; work on stairs without handrail, if pt continues to be dizzy- check orthostatics; goal is to transition to fitness regimen or group fitness class    5:04 PM, 12/08/23 M. Kelly Keola Heninger, PT, DPT Physical Therapist- [place] Office Number: [phone]

## 2023-12-09 ENCOUNTER — Encounter: Payer: Self-pay | Admitting: Hematology and Oncology

## 2023-12-12 ENCOUNTER — Inpatient Hospital Stay

## 2023-12-12 ENCOUNTER — Inpatient Hospital Stay: Attending: Hematology and Oncology

## 2023-12-12 ENCOUNTER — Telehealth: Payer: Self-pay

## 2023-12-12 VITALS — BP 111/90 | HR 81 | Temp 97.8°F | Resp 17

## 2023-12-12 DIAGNOSIS — D751 Secondary polycythemia: Secondary | ICD-10-CM | POA: Insufficient documentation

## 2023-12-12 DIAGNOSIS — D45 Polycythemia vera: Secondary | ICD-10-CM

## 2023-12-12 LAB — CBC WITH DIFFERENTIAL/PLATELET
Abs Immature Granulocytes: 0.03 K/uL (ref 0.00–0.07)
Basophils Absolute: 0.1 K/uL (ref 0.0–0.1)
Basophils Relative: 1 %
Eosinophils Absolute: 0.1 K/uL (ref 0.0–0.5)
Eosinophils Relative: 2 %
HCT: 51.3 % (ref 39.0–52.0)
Hemoglobin: 17.6 g/dL — ABNORMAL HIGH (ref 13.0–17.0)
Immature Granulocytes: 0 %
Lymphocytes Relative: 32 %
Lymphs Abs: 2.2 K/uL (ref 0.7–4.0)
MCH: 30.5 pg (ref 26.0–34.0)
MCHC: 34.3 g/dL (ref 30.0–36.0)
MCV: 88.9 fL (ref 80.0–100.0)
Monocytes Absolute: 0.8 K/uL (ref 0.1–1.0)
Monocytes Relative: 11 %
Neutro Abs: 3.8 K/uL (ref 1.7–7.7)
Neutrophils Relative %: 54 %
Platelets: 197 K/uL (ref 150–400)
RBC: 5.77 MIL/uL (ref 4.22–5.81)
RDW: 13.9 % (ref 11.5–15.5)
WBC: 7 K/uL (ref 4.0–10.5)
nRBC: 0 % (ref 0.0–0.2)

## 2023-12-12 NOTE — Progress Notes (Signed)
 Arley JONETTA Cancer presents today for phlebotomy per MD orders. Phlebotomy procedure with 16 G phlebotomy kit, started at 1331 and ended at 1341 with 93 grams removed due to blood clotting in line. Second sight with 18 G PIV in left arm at started 1350 and ended at 1408 with 524 grams removed.  Total 617 removed Patient observed for 30 minutes after procedure without any incident. Patient tolerated procedure well. IV needle removed intact. Stable upon discharge.

## 2023-12-12 NOTE — Patient Instructions (Signed)

## 2023-12-13 ENCOUNTER — Ambulatory Visit

## 2023-12-13 DIAGNOSIS — M6281 Muscle weakness (generalized): Secondary | ICD-10-CM | POA: Diagnosis not present

## 2023-12-13 DIAGNOSIS — R2681 Unsteadiness on feet: Secondary | ICD-10-CM

## 2023-12-13 NOTE — Therapy (Signed)
 OUTPATIENT PHYSICAL THERAPY NEURO TREATMENT   Patient Name: Alan Wang MRN: [id number] DOB:[birth year], 73 y.o., male Today's Date: 12/13/2023   PCP: Frederik Charleston, MD  REFERRING PROVIDER: Rosario Leatrice FERNS, MD  END OF SESSION:  PT End of Session - 12/13/23 1615     Visit Number 9    Number of Visits 13    Date for Recertification  12/26/23    Authorization Type [employer]    PT Start Time 1615    PT Stop Time 1700    PT Time Calculation (min) 45 min    Equipment Utilized During Treatment Gait belt    Activity Tolerance Patient tolerated treatment well    Behavior During Therapy [facility] for tasks assessed/performed              Past Medical History:  Diagnosis Date   Arthritis    Asthma    Cancer (HCC)    skin   Coronary artery disease    COVID    Diabetes mellitus    type 2, insulin  dependent for 30+ years   History of kidney stones 11/2012   Hyperlipemia    Hypertension    IDDM (insulin  dependent diabetes mellitus)    Shortness of breath    Stroke (HCC)    hx TIA   Past Surgical History:  Procedure Laterality Date   ANGIOPLASTY  04/06/2011   x3    CARDIAC CATHETERIZATION     CARDIAC CATHETERIZATION N/A 09/16/2015   Procedure: Left Heart Cath and Coronary Angiography;  Surgeon: Gordy Bergamo, MD;  Location: [facility] INVASIVE CV LAB;  Service: Cardiovascular;  Laterality: N/A;   cataracts     CHOLECYSTECTOMY     HERNIA REPAIR     x2   KNEE ARTHROSCOPY     LEFT HEART CATH AND CORONARY ANGIOGRAPHY N/A 01/19/2023   Procedure: LEFT HEART CATH AND CORONARY ANGIOGRAPHY;  Surgeon: Verlin Lonni JONETTA, MD;  Location: [facility] INVASIVE CV LAB;  Service: Cardiovascular;  Laterality: N/A;   LEFT HEART CATHETERIZATION WITH CORONARY ANGIOGRAM N/A 03/08/2011   Procedure: LEFT HEART CATHETERIZATION WITH CORONARY ANGIOGRAM;  Surgeon: Erick JONELLE Bergamo, MD;  Location: [facility] CATH LAB;  Service: Cardiovascular;  Laterality: N/A;   PERCUTANEOUS CORONARY STENT INTERVENTION (PCI-S)  N/A 04/06/2011   Procedure: PERCUTANEOUS CORONARY STENT INTERVENTION (PCI-S);  Surgeon: Erick JONELLE Bergamo, MD;  Location: [facility] CATH LAB;  Service: Cardiovascular;  Laterality: N/A;   TOTAL SHOULDER ARTHROPLASTY Right 01/07/2022   Procedure: TOTAL SHOULDER ARTHROPLASTY;  Surgeon: Melita Drivers, MD;  Location: [facility] ORS;  Service: Orthopedics;  Laterality: Right;    Patient Active Problem List   Diagnosis Date Noted   Secondary erythrocytosis 10/11/2023   Acute ischemic stroke (HCC) 10/02/2023   Expressive aphasia 10/02/2023   Polycythemia vera (HCC) 09/26/2023   Malnutrition of moderate degree 12/06/2022   Acute on chronic systolic CHF (congestive heart failure) (HCC) 12/04/2022   Elevated troponin 12/04/2022   Acute metabolic encephalopathy 08/22/2022   Acute on chronic diastolic CHF (congestive heart failure) (HCC) 08/22/2022   Demand ischemia (HCC) 08/22/2022   COVID-19 08/21/2022   TIA (transient ischemic attack) 06/22/2016   DM2 (diabetes mellitus, type 2) (HCC) 06/22/2016   Primary osteoarthritis of both hands 06/03/2016   Primary osteoarthritis of both knees 06/03/2016   Generalized pain 06/03/2016   Memory difficulties 06/03/2016   History of diabetes mellitus 06/03/2016   History of coronary artery disease 06/03/2016   History of hypertension 06/03/2016   History of hypercholesterolemia 06/03/2016   High  risk medication use 06/03/2016   History of high cholesterol 06/03/2016   ANA positive 06/03/2016   Inflammatory arthritis 10/29/2015   Vitamin D deficiency 10/29/2015   Fatigue 09/24/2015   Vertigo 10/31/2013   Chronic cough 11/16/2012   CAD (coronary artery disease), native coronary artery 03/08/2011   Postsurgical percutaneous transluminal coronary angioplasty (PTCA) status 03/08/2011   Insulin  dependent type 2 diabetes mellitus (HCC) 03/08/2011   Essential hypertension 03/08/2011   Shortness of breath 03/08/2011   Mixed hyperlipidemia 03/08/2011    ONSET DATE:  10/01/23  REFERRING DIAG: R53.1 (ICD-10-CM) - Weakness  THERAPY DIAG:  Muscle weakness (generalized)  Unsteadiness on feet  Rationale for Evaluation and Treatment: Rehabilitation  SUBJECTIVE:                                                                                                                                                                                             SUBJECTIVE STATEMENT: Noting issue is more of imbalance rather than feeling swimmyheaded  Pt accompanied by: significant other in waiting room   PERTINENT HISTORY: Asthma, CAD, DMII, HLD, HTN, SOB, R TSA 2023  PAIN:  Are you having pain? Yes: NPRS scale: 3/10 Pain location: R shoulder Pain description: ache Aggravating factors: pt suspects medication side effects Relieving factors: nothing  PRECAUTIONS: Other: R shoulder pain limiting activity   RED FLAGS: None   WEIGHT BEARING RESTRICTIONS: No  FALLS: Has patient fallen in last 6 months? No  LIVING ENVIRONMENT: Lives with: lives with their spouse Lives in: House/apartment Stairs: 3 +1 steps, 2 story home; has stair lift which pt reports awkward/dizzy going up stairs without it Has following equipment at home: Single point cane, Walker - 2 wheeled, Environmental consultant - 4 wheeled, shower chair, and Grab bars  PLOF: Independent with basic ADLs and reports difficulty bathing/dressing d/t R shoulder pain  PATIENT GOALS: pt does not report  OBJECTIVE:   TODAY'S TREATMENT: 12/13/23 Activity Comments  NU-step resistance intervals x 8 min 3 min warm-up level 5 30 sec descending resist intervals w/ 30 sec recovery periods between. 10,9,8,7,6  LE PRE, 5# -LAQ 3x10 -hip add iso 3x10 -seated march 3x10 -standing alt stair taps x 2 min 5# -sidestep x 2 min 5#  Static multisensory balance Difficulty with compliant surfaces              TODAY'S TREATMENT: 12/08/23 Activity Comments  Static multisensory balance   LE PRE: 3 rounds 40 sec work, 20 sec rest -LAQ  4# -alt stair taps 4# -sidestepping 4# -hip abduction, no weight  TODAY'S TREATMENT: 12/06/23 Activity Comments  Nustep L5 x 7 min LEs/L UE Dynamic warmup   monster walks ant/pos red TB In II bars; edu on proper alignment, step length backwards. 1 UE support required  Sidestepping with red loop around ankles 2x 1 min In II bars; small shuffling steps   high knee march with kettlebell in L hand 15#  CGA-min A d/t imbalance. More imbalance on R LE  alt UE swing with high knee march  CGA-min A d/t imbalance. Difficulty coordinating, thus performed in II bars first. Required cues to stop and reset   Stair navigation  With 1 UE, then no UE with CGA and mild imbalance without UE support   Side Steps 10x each  LE weakness, CGA when performing without UE support      PATIENT EDUCATION: Education details: HEP update with edu to perform step up at counter for safety Person educated: Patient Education method: Explanation, Demonstration, Tactile cues, Verbal cues, and Handouts Education comprehension: verbalized understanding and returned demonstration    HOME EXERCISE PROGRAM Access Code: [email or web address] URL: [email or web address] Date: 12/06/2023 Prepared by: [facility] - Outpatient  Rehab - [facility]  Program Notes get a pair of adjustable ankle weights ranging 1-5lbs   Exercises - Sit to Stand Without Arm Support  - 1 x daily - 5 x weekly - 2 sets - 10 reps - Seated Long Arc Quad  - 1 x daily - 5 x weekly - 2 sets - 10 reps - Standing March with Counter Support  - 1 x daily - 5 x weekly - 2 sets - 10 reps - Standing Hip Abduction with Counter Support  - 1 x daily - 5 x weekly - 2 sets - 10 reps - Seated Knee Extension with Resistance  - 1 x daily - 7 x weekly - 2 sets - 10 reps - Seated Hamstring Curls with Resistance  - 1 x daily - 7 x weekly - 2 sets - 10 reps - Standing 3-Way Leg Reach with Resistance at Ankles and Counter Support  - 1 x daily - 7  x weekly - 2 sets - 10 reps - Forward Step Up with Counter Support  - 1 x daily - 5 x weekly - 2 sets - 10 reps    PATIENT EDUCATION: Education details: HEP Person educated: Patient Education method: Explanation, Demonstration, Tactile cues, Verbal cues, and Handouts Education comprehension: verbalized understanding and returned demonstration        Note: Objective measures were completed at Evaluation unless otherwise noted.  DIAGNOSTIC FINDINGS: 10/02/23 brain MRI: New focal area of susceptibility in the lateral aspect of the left thalamus is compatible with and interval punctate hemorrhage. This may be acute and related to the patient's symptoms . 2. Mildly advanced periventricular and subcortical T2 hyperintensities, similar to the prior exam. 3. Stable remote lacunar infarcts of the cerebellum.   COGNITION: Overall cognitive status: Within functional limits for tasks assessed   SENSATION: Pt denies N/T in UEs/LEs   COORDINATION: Alternating pronation/supination: WNL B Alternating toe tap: WNL B Finger to nose: slowed B   MUSCLE TONE: WNL B LEs   POSTURE: rounded shoulders and forward head, flexed trunk and frequently looking down   LOWER EXTREMITY ROM:     Active  Right Eval Left Eval  Hip flexion    Hip extension    Hip abduction    Hip adduction    Hip internal rotation    Hip external rotation  Knee flexion    Knee extension    Ankle dorsiflexion 4 deg with knee bent -3 with bent knee  Ankle plantarflexion    Ankle inversion    Ankle eversion     (Blank rows = not tested)  LOWER EXTREMITY MMT:    MMT Right Eval Left Eval  Hip flexion 3+ 3+  Hip extension    Hip abduction 4 4-  Hip adduction 4+ 4+  Hip internal rotation    Hip external rotation    Knee flexion 3+ 3+  Knee extension 4- 4  Ankle dorsiflexion 4 4  Ankle plantarflexion 4 4  Ankle inversion    Ankle eversion    (Blank rows = not tested)  GAIT: Findings: Assistive  device utilized:None, Level of assistance: Modified independence, and Comments: slowed speed  FUNCTIONAL TESTS:  5 times sit to stand: 18.16 sec without UEs  Timed up and go (TUG): 11.9 sec  Dynamic Gait Index: 19/24            GOALS: Goals reviewed with patient? Yes  SHORT TERM GOALS: Target date: 11/28/2023  Patient to be independent with initial HEP. Baseline: HEP initiated Goal status: NOT MET    LONG TERM GOALS: Target date: 12/26/2023  Patient to be independent with advanced HEP. Baseline: Not yet initiated  Goal status: IN PROGRESS  Patient to demonstrate B LE strength >/=4+/5.  Baseline: See above Goal status: IN PROGRESS  Patient to demonstrate B dorsiflexion AROM to at least 10 degrees to improve efficiency of gait.  Baseline: 4, -3 deg with compensations  Goal status: IN PROGRESS  Patient to score at least 22/24 on DGI in order to decrease risk of falls.  Baseline: 19 Goal status: IN PROGRESS  Patient to demonstrate stair and curb navigation safely without handrail.   Baseline: step-to/through pattern with handrail Goal status: IN PROGRESS  Patient to demonstrate 5xSTS test in <15 sec in order to decrease risk of falls.  Baseline: 18.16 sec Goal status: IN PROGRESS  Patient to report plans to maintain fitness regimen at home or in the community.  Baseline: pt reports sedentary lifestyle currently  Goal status: IN PROGRESS   ASSESSMENT:  CLINICAL IMPRESSION: Static multisensory balance to facilitate postural control and proprioceptive awareness. LE PRE with emphasis on work:rest ratio to improve endurance for functional activity tolerance.  Good activity tolerance demonstrated throughout with ability to progress to different PRE without rest periods between intervals.   OBJECTIVE IMPAIRMENTS: Abnormal gait, decreased activity tolerance, decreased balance, decreased endurance, decreased knowledge of use of DME, difficulty walking, decreased ROM,  decreased strength, impaired flexibility, postural dysfunction, and pain.   ACTIVITY LIMITATIONS: carrying, lifting, bending, sitting, standing, squatting, sleeping, stairs, transfers, bathing, toileting, dressing, reach over head, hygiene/grooming, and locomotion level  PARTICIPATION LIMITATIONS: meal prep, shopping, community activity, and yard work  PERSONAL FACTORS: Age, Past/current experiences, Time since onset of injury/illness/exacerbation, and 3+ comorbidities: Asthma, CAD, DMII, HLD, HTN, SOB, R TSA 2023 are also affecting patient's functional outcome.   REHAB POTENTIAL: Good  CLINICAL DECISION MAKING: Evolving/moderate complexity  EVALUATION COMPLEXITY: Moderate  PLAN:  PT FREQUENCY: 2 x/week for 4 weeks followed by 1x/week for 4 weeks  PT DURATION: -  PLANNED INTERVENTIONS: [id number]- PT Re-evaluation, 97110-Therapeutic exercises, 97530- Therapeutic activity, [id number]- Neuromuscular re-education, 97535- Self Care, [id number]- Manual therapy, [id number]- Gait training, [phone]- Canalith repositioning, [id number]- Electrical stimulation (manual), [id number] (1-2 muscles), [id number] (3+ muscles)- Dry Needling, Patient/Family education, Balance training, Stair training, Taping, Vestibular training, DME instructions, Cryotherapy, and  Moist heat  PLAN FOR NEXT SESSION: review HEP for LE strengthening; work on stairs without handrail, if pt continues to be dizzy- check orthostatics; goal is to transition to fitness regimen or group fitness class    4:16 PM, 12/13/23 M. Kelly Ashle Stief, PT, DPT Physical Therapist-  Office Number: [phone]

## 2023-12-22 ENCOUNTER — Ambulatory Visit

## 2023-12-22 DIAGNOSIS — R2681 Unsteadiness on feet: Secondary | ICD-10-CM

## 2023-12-22 DIAGNOSIS — M6281 Muscle weakness (generalized): Secondary | ICD-10-CM

## 2023-12-22 NOTE — Therapy (Addendum)
 OUTPATIENT PHYSICAL THERAPY NEURO TREATMENT and Progress Note and Recertification   Patient Name: Alan Wang MRN: [id number] DOB:23-Jun-1950, 73 y.o., male Today's Date: 12/22/2023   PCP: Frederik Charleston, MD  REFERRING PROVIDER: Rosario Leatrice FERNS, MD Progress Note Reporting Period 10/31/23 to 12/22/23  See note below for Objective Data and Assessment of Progress/Goals.      END OF SESSION:  PT End of Session - 12/22/23 1619     Visit Number 10    Number of Visits 13    Date for Recertification  12/26/23    Authorization Type [employer]    PT Start Time 1619    PT Stop Time 1700    PT Time Calculation (min) 41 min    Equipment Utilized During Treatment Gait belt    Activity Tolerance Patient tolerated treatment well    Behavior During Therapy [facility] for tasks assessed/performed          Recertification through 01/31/24    Past Medical History:  Diagnosis Date   Arthritis    Asthma    Cancer (HCC)    skin   Coronary artery disease    COVID    Diabetes mellitus    type 2, insulin  dependent for 30+ years   History of kidney stones 11/2012   Hyperlipemia    Hypertension    IDDM (insulin  dependent diabetes mellitus)    Shortness of breath    Stroke (HCC)    hx TIA   Past Surgical History:  Procedure Laterality Date   ANGIOPLASTY  04/06/2011   x3    CARDIAC CATHETERIZATION     CARDIAC CATHETERIZATION N/A 09/16/2015   Procedure: Left Heart Cath and Coronary Angiography;  Surgeon: Gordy Bergamo, MD;  Location: [facility] INVASIVE CV LAB;  Service: Cardiovascular;  Laterality: N/A;   cataracts     CHOLECYSTECTOMY     HERNIA REPAIR     x2   KNEE ARTHROSCOPY     LEFT HEART CATH AND CORONARY ANGIOGRAPHY N/A 01/19/2023   Procedure: LEFT HEART CATH AND CORONARY ANGIOGRAPHY;  Surgeon: Verlin Lonni JONETTA, MD;  Location: [facility] INVASIVE CV LAB;  Service: Cardiovascular;  Laterality: N/A;   LEFT HEART CATHETERIZATION WITH CORONARY ANGIOGRAM N/A 03/08/2011   Procedure:  LEFT HEART CATHETERIZATION WITH CORONARY ANGIOGRAM;  Surgeon: Erick JONELLE Bergamo, MD;  Location: [facility] CATH LAB;  Service: Cardiovascular;  Laterality: N/A;   PERCUTANEOUS CORONARY STENT INTERVENTION (PCI-S) N/A 04/06/2011   Procedure: PERCUTANEOUS CORONARY STENT INTERVENTION (PCI-S);  Surgeon: Erick JONELLE Bergamo, MD;  Location: [facility] CATH LAB;  Service: Cardiovascular;  Laterality: N/A;   TOTAL SHOULDER ARTHROPLASTY Right 01/07/2022   Procedure: TOTAL SHOULDER ARTHROPLASTY;  Surgeon: Melita Drivers, MD;  Location: [facility] ORS;  Service: Orthopedics;  Laterality: Right;    Patient Active Problem List   Diagnosis Date Noted   Secondary erythrocytosis 10/11/2023   Acute ischemic stroke (HCC) 10/02/2023   Expressive aphasia 10/02/2023   Polycythemia vera (HCC) 09/26/2023   Malnutrition of moderate degree 12/06/2022   Acute on chronic systolic CHF (congestive heart failure) (HCC) 12/04/2022   Elevated troponin 12/04/2022   Acute metabolic encephalopathy 08/22/2022   Acute on chronic diastolic CHF (congestive heart failure) (HCC) 08/22/2022   Demand ischemia (HCC) 08/22/2022   COVID-19 08/21/2022   TIA (transient ischemic attack) 06/22/2016   DM2 (diabetes mellitus, type 2) (HCC) 06/22/2016   Primary osteoarthritis of both hands 06/03/2016   Primary osteoarthritis of both knees 06/03/2016   Generalized pain 06/03/2016   Memory difficulties 06/03/2016  History of diabetes mellitus 06/03/2016   History of coronary artery disease 06/03/2016   History of hypertension 06/03/2016   History of hypercholesterolemia 06/03/2016   High risk medication use 06/03/2016   History of high cholesterol 06/03/2016   ANA positive 06/03/2016   Inflammatory arthritis 10/29/2015   Vitamin D deficiency 10/29/2015   Fatigue 09/24/2015   Vertigo 10/31/2013   Chronic cough 11/16/2012   CAD (coronary artery disease), native coronary artery 03/08/2011   Postsurgical percutaneous transluminal coronary angioplasty (PTCA)  status 03/08/2011   Insulin  dependent type 2 diabetes mellitus (HCC) 03/08/2011   Essential hypertension 03/08/2011   Shortness of breath 03/08/2011   Mixed hyperlipidemia 03/08/2011    ONSET DATE: 10/01/23  REFERRING DIAG: R53.1 (ICD-10-CM) - Weakness  THERAPY DIAG:  Muscle weakness (generalized)  Unsteadiness on feet  Rationale for Evaluation and Treatment: Rehabilitation  SUBJECTIVE:                                                                                                                                                                                             SUBJECTIVE STATEMENT: Been a long day on the go  Pt accompanied by: significant other in waiting room   PERTINENT HISTORY: Asthma, CAD, DMII, HLD, HTN, SOB, R TSA 2023  PAIN:  Are you having pain? Yes: NPRS scale: 3/10 Pain location: R shoulder Pain description: ache Aggravating factors: pt suspects medication side effects Relieving factors: nothing  PRECAUTIONS: Other: R shoulder pain limiting activity   RED FLAGS: None   WEIGHT BEARING RESTRICTIONS: No  FALLS: Has patient fallen in last 6 months? No  LIVING ENVIRONMENT: Lives with: lives with their spouse Lives in: House/apartment Stairs: 3 +1 steps, 2 story home; has stair lift which pt reports awkward/dizzy going up stairs without it Has following equipment at home: Single point cane, Walker - 2 wheeled, Environmental Consultant - 4 wheeled, shower chair, and Grab bars  PLOF: Independent with basic ADLs and reports difficulty bathing/dressing d/t R shoulder pain  PATIENT GOALS: pt does not report  OBJECTIVE:   TODAY'S TREATMENT: 12/22/23 Activity Comments  Goals performance and review   Corner balance multisensory for HEP additions                  TODAY'S TREATMENT: 12/13/23 Activity Comments  NU-step resistance intervals x 8 min 3 min warm-up level 5 30 sec descending resist intervals w/ 30 sec recovery periods between. 10,9,8,7,6  LE PRE, 5#  -LAQ 3x10 -hip add iso 3x10 -seated march 3x10 -standing alt stair taps x 2 min 5# -sidestep x 2 min 5#  Static multisensory balance Difficulty with compliant surfaces  PATIENT EDUCATION: Education details: HEP update with edu to perform step up at counter for safety Person educated: Patient Education method: Explanation, Demonstration, Tactile cues, Verbal cues, and Handouts Education comprehension: verbalized understanding and returned demonstration    HOME EXERCISE PROGRAM Access Code: [email or web address] URL: [email or web address] Date: 12/06/2023 Prepared by: [facility] - Outpatient  Rehab - [facility]  Program Notes get a pair of adjustable ankle weights ranging 1-5lbs   Exercises - Sit to Stand Without Arm Support  - 1 x daily - 5 x weekly - 2 sets - 10 reps - Seated Long Arc Quad  - 1 x daily - 5 x weekly - 2 sets - 10 reps - Standing March with Counter Support  - 1 x daily - 5 x weekly - 2 sets - 10 reps - Standing Hip Abduction with Counter Support  - 1 x daily - 5 x weekly - 2 sets - 10 reps - Seated Knee Extension with Resistance  - 1 x daily - 7 x weekly - 2 sets - 10 reps - Seated Hamstring Curls with Resistance  - 1 x daily - 7 x weekly - 2 sets - 10 reps - Standing 3-Way Leg Reach with Resistance at Ankles and Counter Support  - 1 x daily - 7 x weekly - 2 sets - 10 reps - Forward Step Up with Counter Support  - 1 x daily - 5 x weekly - 2 sets - 10 reps - Corner Balance Feet Together With Eyes Open  - 1 x daily - 7 x weekly - 3 sets - 30 sec hold - Corner Balance Feet Together With Eyes Closed  - 1 x daily - 7 x weekly - 3 sets - 30 sec hold - Corner Balance Feet Together: Eyes Open With Head Turns  - 1 x daily - 7 x weekly - 3 sets - 30 sec hold - Corner Balance Feet Together: Eyes Closed With Head Turns  - 1 x daily - 7 x weekly - 3 sets - 30 sec hold - Semi-Tandem Corner Balance With Eyes Open  - 1 x daily - 7 x weekly - 3  sets - 30 sec hold    PATIENT EDUCATION: Education details: HEP Person educated: Patient Education method: Explanation, Demonstration, Tactile cues, Verbal cues, and Handouts Education comprehension: verbalized understanding and returned demonstration        Note: Objective measures were completed at Evaluation unless otherwise noted.  DIAGNOSTIC FINDINGS: 10/02/23 brain MRI: New focal area of susceptibility in the lateral aspect of the left thalamus is compatible with and interval punctate hemorrhage. This may be acute and related to the patient's symptoms . 2. Mildly advanced periventricular and subcortical T2 hyperintensities, similar to the prior exam. 3. Stable remote lacunar infarcts of the cerebellum.   COGNITION: Overall cognitive status: Within functional limits for tasks assessed   SENSATION: Pt denies N/T in UEs/LEs   COORDINATION: Alternating pronation/supination: WNL B Alternating toe tap: WNL B Finger to nose: slowed B   MUSCLE TONE: WNL B LEs   POSTURE: rounded shoulders and forward head, flexed trunk and frequently looking down   LOWER EXTREMITY ROM:     Active  Right Eval Left Eval  Hip flexion    Hip extension    Hip abduction    Hip adduction    Hip internal rotation    Hip external rotation    Knee flexion    Knee extension    Ankle dorsiflexion 4 deg with knee  bent -3 with bent knee  Ankle plantarflexion    Ankle inversion    Ankle eversion     (Blank rows = not tested)  LOWER EXTREMITY MMT:    MMT Right Eval Left Eval Right 10/16 Left 10/16  Hip flexion 3+ 3+ 4 4  Hip extension      Hip abduction 4 4- 4 4  Hip adduction 4+ 4+ 5 5  Hip internal rotation      Hip external rotation      Knee flexion 3+ 3+ 4 4  Knee extension 4- 4 4 4+  Ankle dorsiflexion 4 4 4+ 4+  Ankle plantarflexion 4 4    Ankle inversion      Ankle eversion      (Blank rows = not tested)  GAIT: Findings: Assistive device utilized:None, Level of  assistance: Modified independence, and Comments: slowed speed  FUNCTIONAL TESTS:  5 times sit to stand: 18.16 sec without UEs  Timed up and go (TUG): 11.9 sec  Dynamic Gait Index: 19/24            GOALS: Goals reviewed with patient? Yes  SHORT TERM GOALS: Target date: 11/28/2023  Patient to be independent with initial HEP. Baseline: HEP initiated Goal status: NOT MET    LONG TERM GOALS: Target date: 12/26/2023  Patient to be independent with advanced HEP. Baseline: Not yet initiated  Goal status: IN PROGRESS  Patient to demonstrate B LE strength >/=4+/5.  Baseline: See above Goal status: IN PROGRESS  Patient to demonstrate B dorsiflexion AROM to at least 10 degrees to improve efficiency of gait.  Baseline: 4, -3 deg with compensations; 8 deg right, 10 left Goal status: Partially met  Patient to score at least 22/24 on DGI in order to decrease risk of falls.  Baseline: 19; 21/24 Goal status: IN PROGRESS 10/16  Patient to demonstrate stair and curb navigation safely without handrail.   Baseline: step-to/through pattern with handrail Goal status: IN PROGRESS  Patient to demonstrate 5xSTS test in <15 sec in order to decrease risk of falls.  Baseline: 18.16 sec; 17 sec Goal status: IN PROGRESS  Patient to report plans to maintain fitness regimen at home or in the community.  Baseline: pt reports sedentary lifestyle currently  Goal status: IN PROGRESS   ASSESSMENT:  CLINICAL IMPRESSION: Progress Note details performed and reviewed with slight improvement to 5xSTS and significant improvement for Dynamic Gait Index from baseline 19 to 21/24 indicating low risk for falls. Manual muscle testing revealing modest improvements in BLE strength as well.  Discussed tenets of POC details and rationale of strength/progressive resistive exercises and its myriad benefits. Instructed in static multisensory balance activities to improve proprioceptive awareness for greater  postural stability exhibiting difficulty with narrow BOS and/or eyes closed conditions. Continued sessions to progress POC details to improve mobility and reduce risk for falls.   OBJECTIVE IMPAIRMENTS: Abnormal gait, decreased activity tolerance, decreased balance, decreased endurance, decreased knowledge of use of DME, difficulty walking, decreased ROM, decreased strength, impaired flexibility, postural dysfunction, and pain.   ACTIVITY LIMITATIONS: carrying, lifting, bending, sitting, standing, squatting, sleeping, stairs, transfers, bathing, toileting, dressing, reach over head, hygiene/grooming, and locomotion level  PARTICIPATION LIMITATIONS: meal prep, shopping, community activity, and yard work  PERSONAL FACTORS: Age, Past/current experiences, Time since onset of injury/illness/exacerbation, and 3+ comorbidities: Asthma, CAD, DMII, HLD, HTN, SOB, R TSA 2023 are also affecting patient's functional outcome.   REHAB POTENTIAL: Good  CLINICAL DECISION MAKING: Evolving/moderate complexity  EVALUATION COMPLEXITY: Moderate  PLAN:  PT FREQUENCY: 2 x/week for 4 weeks followed by 1x/week for 4 weeks  PT DURATION: -  PLANNED INTERVENTIONS: [id number]- PT Re-evaluation, 97110-Therapeutic exercises, 97530- Therapeutic activity, [id number]- Neuromuscular re-education, 97535- Self Care, [id number]- Manual therapy, [id number]- Gait training, [phone]- Canalith repositioning, [id number]- Electrical stimulation (manual), [phone] (1-2 muscles), [id number] (3+ muscles)- Dry Needling, Patient/Family education, Balance training, Stair training, Taping, Vestibular training, DME instructions, Cryotherapy, and Moist heat  PLAN FOR NEXT SESSION: review HEP for LE strengthening; work on stairs without handrail, ; goal is to transition to fitness regimen or group fitness class    4:20 PM, 12/22/23 M. Kelly Jaleeya Mcnelly, PT, DPT Physical Therapist- [place] Office Number: [phone]

## 2023-12-27 ENCOUNTER — Ambulatory Visit

## 2023-12-27 DIAGNOSIS — M6281 Muscle weakness (generalized): Secondary | ICD-10-CM

## 2023-12-27 DIAGNOSIS — R2681 Unsteadiness on feet: Secondary | ICD-10-CM | POA: Diagnosis not present

## 2023-12-27 NOTE — Therapy (Signed)
 OUTPATIENT PHYSICAL THERAPY NEURO TREATMENT    Patient Name: Alan Wang MRN: [id number] DOB:May 05, 1950, 73 y.o., male Today's Date: 12/27/2023   PCP: Frederik Charleston, MD  REFERRING PROVIDER: Rosario Leatrice FERNS, MD     END OF SESSION:  PT End of Session - 12/27/23 1621     Visit Number 11    Number of Visits 13    Date for Recertification  12/26/23    Authorization Type [employer]    PT Start Time 1620    PT Stop Time 1700    PT Time Calculation (min) 40 min    Equipment Utilized During Treatment Gait belt    Activity Tolerance Patient tolerated treatment well    Behavior During Therapy [facility] for tasks assessed/performed              Past Medical History:  Diagnosis Date   Arthritis    Asthma    Cancer (HCC)    skin   Coronary artery disease    COVID    Diabetes mellitus    type 2, insulin  dependent for 30+ years   History of kidney stones 11/2012   Hyperlipemia    Hypertension    IDDM (insulin  dependent diabetes mellitus)    Shortness of breath    Stroke (HCC)    hx TIA   Past Surgical History:  Procedure Laterality Date   ANGIOPLASTY  04/06/2011   x3    CARDIAC CATHETERIZATION     CARDIAC CATHETERIZATION N/A 09/16/2015   Procedure: Left Heart Cath and Coronary Angiography;  Surgeon: Gordy Bergamo, MD;  Location: [facility] INVASIVE CV LAB;  Service: Cardiovascular;  Laterality: N/A;   cataracts     CHOLECYSTECTOMY     HERNIA REPAIR     x2   KNEE ARTHROSCOPY     LEFT HEART CATH AND CORONARY ANGIOGRAPHY N/A 01/19/2023   Procedure: LEFT HEART CATH AND CORONARY ANGIOGRAPHY;  Surgeon: Verlin Lonni JONETTA, MD;  Location: [facility] INVASIVE CV LAB;  Service: Cardiovascular;  Laterality: N/A;   LEFT HEART CATHETERIZATION WITH CORONARY ANGIOGRAM N/A 03/08/2011   Procedure: LEFT HEART CATHETERIZATION WITH CORONARY ANGIOGRAM;  Surgeon: Erick JONELLE Bergamo, MD;  Location: [facility] CATH LAB;  Service: Cardiovascular;  Laterality: N/A;   PERCUTANEOUS CORONARY STENT INTERVENTION  (PCI-S) N/A 04/06/2011   Procedure: PERCUTANEOUS CORONARY STENT INTERVENTION (PCI-S);  Surgeon: Erick JONELLE Bergamo, MD;  Location: [facility] CATH LAB;  Service: Cardiovascular;  Laterality: N/A;   TOTAL SHOULDER ARTHROPLASTY Right 01/07/2022   Procedure: TOTAL SHOULDER ARTHROPLASTY;  Surgeon: Melita Drivers, MD;  Location: [facility] ORS;  Service: Orthopedics;  Laterality: Right;    Patient Active Problem List   Diagnosis Date Noted   Secondary erythrocytosis 10/11/2023   Acute ischemic stroke (HCC) 10/02/2023   Expressive aphasia 10/02/2023   Polycythemia vera (HCC) 09/26/2023   Malnutrition of moderate degree 12/06/2022   Acute on chronic systolic CHF (congestive heart failure) (HCC) 12/04/2022   Elevated troponin 12/04/2022   Acute metabolic encephalopathy 08/22/2022   Acute on chronic diastolic CHF (congestive heart failure) (HCC) 08/22/2022   Demand ischemia (HCC) 08/22/2022   COVID-19 08/21/2022   TIA (transient ischemic attack) 06/22/2016   DM2 (diabetes mellitus, type 2) (HCC) 06/22/2016   Primary osteoarthritis of both hands 06/03/2016   Primary osteoarthritis of both knees 06/03/2016   Generalized pain 06/03/2016   Memory difficulties 06/03/2016   History of diabetes mellitus 06/03/2016   History of coronary artery disease 06/03/2016   History of hypertension 06/03/2016   History of hypercholesterolemia  06/03/2016   High risk medication use 06/03/2016   History of high cholesterol 06/03/2016   ANA positive 06/03/2016   Inflammatory arthritis 10/29/2015   Vitamin D deficiency 10/29/2015   Fatigue 09/24/2015   Vertigo 10/31/2013   Chronic cough 11/16/2012   CAD (coronary artery disease), native coronary artery 03/08/2011   Postsurgical percutaneous transluminal coronary angioplasty (PTCA) status 03/08/2011   Insulin  dependent type 2 diabetes mellitus (HCC) 03/08/2011   Essential hypertension 03/08/2011   Shortness of breath 03/08/2011   Mixed hyperlipidemia 03/08/2011     ONSET DATE: 10/01/23  REFERRING DIAG: R53.1 (ICD-10-CM) - Weakness  THERAPY DIAG:  Muscle weakness (generalized)  Unsteadiness on feet  Rationale for Evaluation and Treatment: Rehabilitation  SUBJECTIVE:                                                                                                                                                                                             SUBJECTIVE STATEMENT: I looked up information on my medication and I feel it was one of the statins.  Pt accompanied by: significant other in waiting room   PERTINENT HISTORY: Asthma, CAD, DMII, HLD, HTN, SOB, R TSA 2023  PAIN:  Are you having pain? Yes: NPRS scale: 3/10 Pain location: R shoulder Pain description: ache Aggravating factors: pt suspects medication side effects Relieving factors: nothing  PRECAUTIONS: Other: R shoulder pain limiting activity   RED FLAGS: None   WEIGHT BEARING RESTRICTIONS: No  FALLS: Has patient fallen in last 6 months? No  LIVING ENVIRONMENT: Lives with: lives with their spouse Lives in: House/apartment Stairs: 3 +1 steps, 2 story home; has stair lift which pt reports awkward/dizzy going up stairs without it Has following equipment at home: Single point cane, Walker - 2 wheeled, Environmental consultant - 4 wheeled, shower chair, and Grab bars  PLOF: Independent with basic ADLs and reports difficulty bathing/dressing d/t R shoulder pain  PATIENT GOALS: pt does not report  OBJECTIVE:   TODAY'S TREATMENT: 12/27/23 Activity Comments  HEP review: strength/balance Cues 25% for sequence/form. CGA for balance   Dynamic balance 4-square step with obstacles High step and lateral step over hurdles                 TODAY'S TREATMENT: 12/22/23 Activity Comments  Goals performance and review   Corner balance multisensory for HEP additions                  TODAY'S TREATMENT: 12/13/23 Activity Comments  NU-step resistance intervals x 8 min 3 min warm-up  level 5 30 sec descending resist intervals w/ 30 sec recovery periods between. 10,9,8,7,6  LE  PRE, 5# -LAQ 3x10 -hip add iso 3x10 -seated march 3x10 -standing alt stair taps x 2 min 5# -sidestep x 2 min 5#  Static multisensory balance Difficulty with compliant surfaces                    PATIENT EDUCATION: Education details: HEP update with edu to perform step up at counter for safety Person educated: Patient Education method: Explanation, Demonstration, Tactile cues, Verbal cues, and Handouts Education comprehension: verbalized understanding and returned demonstration    HOME EXERCISE PROGRAM Access Code: [email or web address] URL: [email or web address] Date: 12/06/2023 Prepared by: [facility] - Outpatient  Rehab - [facility]  Program Notes get a pair of adjustable ankle weights ranging 1-5lbs   Exercises - Sit to Stand Without Arm Support  - 1 x daily - 5 x weekly - 2 sets - 10 reps - Seated Long Arc Quad  - 1 x daily - 5 x weekly - 2 sets - 10 reps - Standing March with Counter Support  - 1 x daily - 5 x weekly - 2 sets - 10 reps - Standing Hip Abduction with Counter Support  - 1 x daily - 5 x weekly - 2 sets - 10 reps - Seated Knee Extension with Resistance  - 1 x daily - 7 x weekly - 2 sets - 10 reps - Seated Hamstring Curls with Resistance  - 1 x daily - 7 x weekly - 2 sets - 10 reps - Standing 3-Way Leg Reach with Resistance at Ankles and Counter Support  - 1 x daily - 7 x weekly - 2 sets - 10 reps - Forward Step Up with Counter Support  - 1 x daily - 5 x weekly - 2 sets - 10 reps - Corner Balance Feet Together With Eyes Open  - 1 x daily - 7 x weekly - 3 sets - 30 sec hold - Corner Balance Feet Together With Eyes Closed  - 1 x daily - 7 x weekly - 3 sets - 30 sec hold - Corner Balance Feet Together: Eyes Open With Head Turns  - 1 x daily - 7 x weekly - 3 sets - 30 sec hold - Corner Balance Feet Together: Eyes Closed With Head Turns  - 1 x daily - 7 x  weekly - 3 sets - 30 sec hold - Semi-Tandem Corner Balance With Eyes Open  - 1 x daily - 7 x weekly - 3 sets - 30 sec hold    PATIENT EDUCATION: Education details: HEP Person educated: Patient Education method: Explanation, Demonstration, Tactile cues, Verbal cues, and Handouts Education comprehension: verbalized understanding and returned demonstration        Note: Objective measures were completed at Evaluation unless otherwise noted.  DIAGNOSTIC FINDINGS: 10/02/23 brain MRI: New focal area of susceptibility in the lateral aspect of the left thalamus is compatible with and interval punctate hemorrhage. This may be acute and related to the patient's symptoms . 2. Mildly advanced periventricular and subcortical T2 hyperintensities, similar to the prior exam. 3. Stable remote lacunar infarcts of the cerebellum.   COGNITION: Overall cognitive status: Within functional limits for tasks assessed   SENSATION: Pt denies N/T in UEs/LEs   COORDINATION: Alternating pronation/supination: WNL B Alternating toe tap: WNL B Finger to nose: slowed B   MUSCLE TONE: WNL B LEs   POSTURE: rounded shoulders and forward head, flexed trunk and frequently looking down   LOWER EXTREMITY ROM:     Active  Right Eval  Left Eval  Hip flexion    Hip extension    Hip abduction    Hip adduction    Hip internal rotation    Hip external rotation    Knee flexion    Knee extension    Ankle dorsiflexion 4 deg with knee bent -3 with bent knee  Ankle plantarflexion    Ankle inversion    Ankle eversion     (Blank rows = not tested)  LOWER EXTREMITY MMT:    MMT Right Eval Left Eval Right 10/16 Left 10/16  Hip flexion 3+ 3+ 4 4  Hip extension      Hip abduction 4 4- 4 4  Hip adduction 4+ 4+ 5 5  Hip internal rotation      Hip external rotation      Knee flexion 3+ 3+ 4 4  Knee extension 4- 4 4 4+  Ankle dorsiflexion 4 4 4+ 4+  Ankle plantarflexion 4 4    Ankle inversion       Ankle eversion      (Blank rows = not tested)  GAIT: Findings: Assistive device utilized:None, Level of assistance: Modified independence, and Comments: slowed speed  FUNCTIONAL TESTS:  5 times sit to stand: 18.16 sec without UEs  Timed up and go (TUG): 11.9 sec  Dynamic Gait Index: 19/24            GOALS: Goals reviewed with patient? Yes  SHORT TERM GOALS: Target date: 11/28/2023  Patient to be independent with initial HEP. Baseline: HEP initiated Goal status: NOT MET    LONG TERM GOALS: Target date: 12/26/2023  Patient to be independent with advanced HEP. Baseline: Not yet initiated  Goal status: IN PROGRESS  Patient to demonstrate B LE strength >/=4+/5.  Baseline: See above Goal status: IN PROGRESS  Patient to demonstrate B dorsiflexion AROM to at least 10 degrees to improve efficiency of gait.  Baseline: 4, -3 deg with compensations; 8 deg right, 10 left Goal status: Partially met  Patient to score at least 22/24 on DGI in order to decrease risk of falls.  Baseline: 19; 21/24 Goal status: IN PROGRESS 10/16  Patient to demonstrate stair and curb navigation safely without handrail.   Baseline: step-to/through pattern with handrail Goal status: IN PROGRESS  Patient to demonstrate 5xSTS test in <15 sec in order to decrease risk of falls.  Baseline: 18.16 sec; 17 sec Goal status: IN PROGRESS  Patient to report plans to maintain fitness regimen at home or in the community.  Baseline: pt reports sedentary lifestyle currently  Goal status: IN PROGRESS   ASSESSMENT:  CLINICAL IMPRESSION: Improved performance to HEP with printout for reference with 25% cues in sequence and form. Good recall to static balance for HEP with CGA due to unsteadiness. Dynamic balance activities to facilitate change of direction, postural correction, and single limb support. Increased difficulty with managing obstacles stepping backwards with instance of LOB requiring physical  support to correct. Reinforced benefits of routine strength training as it pertains to balance, mobility, and activity tolerance.   OBJECTIVE IMPAIRMENTS: Abnormal gait, decreased activity tolerance, decreased balance, decreased endurance, decreased knowledge of use of DME, difficulty walking, decreased ROM, decreased strength, impaired flexibility, postural dysfunction, and pain.   ACTIVITY LIMITATIONS: carrying, lifting, bending, sitting, standing, squatting, sleeping, stairs, transfers, bathing, toileting, dressing, reach over head, hygiene/grooming, and locomotion level  PARTICIPATION LIMITATIONS: meal prep, shopping, community activity, and yard work  PERSONAL FACTORS: Age, Past/current experiences, Time since onset of injury/illness/exacerbation, and 3+ comorbidities:  Asthma, CAD, DMII, HLD, HTN, SOB, R TSA 2023 are also affecting patient's functional outcome.   REHAB POTENTIAL: Good  CLINICAL DECISION MAKING: Evolving/moderate complexity  EVALUATION COMPLEXITY: Moderate  PLAN:  PT FREQUENCY: 2 x/week for 4 weeks followed by 1x/week for 4 weeks  PT DURATION: -  PLANNED INTERVENTIONS: [id number]- PT Re-evaluation, 97110-Therapeutic exercises, 97530- Therapeutic activity, [id number]- Neuromuscular re-education, 97535- Self Care, [id number]- Manual therapy, [id number]- Gait training, [phone]- Canalith repositioning, [id number]- Electrical stimulation (manual), [id number] (1-2 muscles), [id number] (3+ muscles)- Dry Needling, Patient/Family education, Balance training, Stair training, Taping, Vestibular training, DME instructions, Cryotherapy, and Moist heat  PLAN FOR NEXT SESSION: review HEP for LE strengthening; work on stairs without handrail, ; goal is to transition to fitness regimen or group fitness class    4:21 PM, 12/27/23 M. Kelly Alejah Aristizabal, PT, DPT Physical Therapist- [place] Office Number: [phone]

## 2023-12-29 NOTE — Progress Notes (Unsigned)
 Office Visit Note  Patient: Alan Wang             Date of Birth: 05-24-1950           MRN: [id number]             PCP: Frederik Charleston, MD Referring: Frederik Charleston, MD Visit Date: 01/12/2024 Occupation: Data Unavailable  Subjective:  Generalized pain  History of Present Illness: Alan Wang is a 73 y.o. male with osteoarthritis.  He returns today after his last visit in December 2022.  He states he had a right total shoulder replacement in 2023.  He states the pain never resolved and he continues to have discomfort in his right shoulder.  He was evaluated by [facility].  He has not had a follow-up appointment.  Patient was hospitalized with possible stroke 6 weeks ago at the time the MRI showed left thalamic punctate hemorrhage.  He had complete recovery from it.  He states he was placed on Repatha and Mounjaro by Dr. Tommas but he discontinued it due to arthralgias and myalgias.  He came off both medications due to the side effects.  He was diagnosed with the polycythemia vera secondary to obstructive sleep apnea.  He had phlebotomy recently.  He has been followed by Dr. Lonn.  Is been experiencing overall pain and fatigue for that reason he came for an evaluation.    Activities of Daily Living:  Patient reports morning stiffness for all day.   Patient Reports nocturnal pain.  Difficulty dressing/grooming: Reports Difficulty climbing stairs: Reports Difficulty getting out of chair: Reports Difficulty using hands for taps, buttons, cutlery, and/or writing: Reports  Review of Systems  Constitutional:  Positive for fatigue.  HENT:  Positive for mouth dryness. Negative for mouth sores.   Eyes:  Negative for dryness.  Respiratory:  Negative for shortness of breath.   Cardiovascular:  Negative for chest pain and palpitations.  Gastrointestinal:  Positive for constipation. Negative for blood in stool and diarrhea.  Endocrine: Negative for increased urination.   Genitourinary:  Negative for involuntary urination.  Musculoskeletal:  Positive for joint pain, gait problem, joint pain, myalgias, muscle weakness, morning stiffness and myalgias. Negative for joint swelling and muscle tenderness.  Skin:  Negative for color change, rash, hair loss and sensitivity to sunlight.  Allergic/Immunologic: Negative for susceptible to infections.  Neurological:  Positive for dizziness. Negative for headaches.  Hematological:  Negative for swollen glands.  Psychiatric/Behavioral:  Negative for depressed mood and sleep disturbance. The patient is not nervous/anxious.     PMFS History:  Patient Active Problem List   Diagnosis Date Noted   Secondary erythrocytosis 10/11/2023   Acute ischemic stroke (HCC) 10/02/2023   Expressive aphasia 10/02/2023   Polycythemia vera (HCC) 09/26/2023   Malnutrition of moderate degree 12/06/2022   Acute on chronic systolic CHF (congestive heart failure) (HCC) 12/04/2022   Elevated troponin 12/04/2022   Acute metabolic encephalopathy 08/22/2022   Acute on chronic diastolic CHF (congestive heart failure) (HCC) 08/22/2022   Demand ischemia (HCC) 08/22/2022   COVID-19 08/21/2022   TIA (transient ischemic attack) 06/22/2016   DM2 (diabetes mellitus, type 2) (HCC) 06/22/2016   Primary osteoarthritis of both hands 06/03/2016   Primary osteoarthritis of both knees 06/03/2016   Generalized pain 06/03/2016   Memory difficulties 06/03/2016   History of diabetes mellitus 06/03/2016   History of coronary artery disease 06/03/2016   History of hypertension 06/03/2016   History of hypercholesterolemia 06/03/2016   High risk medication  use 06/03/2016   History of high cholesterol 06/03/2016   ANA positive 06/03/2016   Inflammatory arthritis 10/29/2015   Vitamin D deficiency 10/29/2015   Fatigue 09/24/2015   Vertigo 10/31/2013   Chronic cough 11/16/2012   CAD (coronary artery disease), native coronary artery 03/08/2011   Postsurgical  percutaneous transluminal coronary angioplasty (PTCA) status 03/08/2011   Insulin  dependent type 2 diabetes mellitus (HCC) 03/08/2011   Essential hypertension 03/08/2011   Shortness of breath 03/08/2011   Mixed hyperlipidemia 03/08/2011    Past Medical History:  Diagnosis Date   Arthritis    Asthma    Cancer (HCC)    skin   Coronary artery disease    COVID    Diabetes mellitus    type 2, insulin  dependent for 30+ years   History of kidney stones 11/2012   Hyperlipemia    Hypertension    IDDM (insulin  dependent diabetes mellitus)    Shortness of breath    Stroke (HCC)    hx TIA    Family History  Problem Relation Age of Onset   Emphysema Mother    COPD Mother    Alzheimer's disease Father    Congestive Heart Failure Sister    Diabetes Sister    Chronic fatigue Sister    Asthma Paternal Aunt    Heart attack Paternal Grandfather    Past Surgical History:  Procedure Laterality Date   ANGIOPLASTY  04/06/2011   x3    CARDIAC CATHETERIZATION     CARDIAC CATHETERIZATION N/A 09/16/2015   Procedure: Left Heart Cath and Coronary Angiography;  Surgeon: Gordy Bergamo, MD;  Location: [facility] INVASIVE CV LAB;  Service: Cardiovascular;  Laterality: N/A;   cataracts     CHOLECYSTECTOMY     HERNIA REPAIR     x2   KNEE ARTHROSCOPY     LEFT HEART CATH AND CORONARY ANGIOGRAPHY N/A 01/19/2023   Procedure: LEFT HEART CATH AND CORONARY ANGIOGRAPHY;  Surgeon: Verlin Lonni BIRCH, MD;  Location: [facility] INVASIVE CV LAB;  Service: Cardiovascular;  Laterality: N/A;   LEFT HEART CATHETERIZATION WITH CORONARY ANGIOGRAM N/A 03/08/2011   Procedure: LEFT HEART CATHETERIZATION WITH CORONARY ANGIOGRAM;  Surgeon: Erick JONELLE Bergamo, MD;  Location: [facility] CATH LAB;  Service: Cardiovascular;  Laterality: N/A;   PERCUTANEOUS CORONARY STENT INTERVENTION (PCI-S) N/A 04/06/2011   Procedure: PERCUTANEOUS CORONARY STENT INTERVENTION (PCI-S);  Surgeon: Erick JONELLE Bergamo, MD;  Location: [facility] CATH LAB;  Service: Cardiovascular;   Laterality: N/A;   TOTAL SHOULDER ARTHROPLASTY Right 01/07/2022   Procedure: TOTAL SHOULDER ARTHROPLASTY;  Surgeon: Melita Drivers, MD;  Location: [facility]S;  Service: Orthopedics;  Laterality: Right;    Social History   Tobacco Use   Smoking status: Never    Passive exposure: Past   Smokeless tobacco: Never  Vaping Use   Vaping status: Never Used  Substance Use Topics   Alcohol use: Yes    Comment: occasionally   Drug use: No   Social History   Social History Narrative   Patient lives at home with his spouse.   Caffeine Use: occasionally     Immunization History  Administered Date(s) Administered   PFIZER(Purple Top)SARS-COV-2 Vaccination 04/02/2019, 04/23/2019, 06/17/2020   Pneumococcal Polysaccharide-23 12/14/2011     Objective: Vital Signs: BP (!) 164/92   Pulse 73   Temp 98 F (36.7 C)   Resp 17   Ht 5' 10.5 (1.791 m)   Wt 185 lb 12.8 oz (84.3 kg)   BMI 26.28 kg/m    Physical Exam Vitals and  nursing note reviewed.  Constitutional:      Appearance: He is well-developed.  HENT:     Head: Normocephalic and atraumatic.  Eyes:     Conjunctiva/sclera: Conjunctivae normal.     Pupils: Pupils are equal, round, and reactive to light.  Cardiovascular:     Rate and Rhythm: Normal rate and regular rhythm.     Heart sounds: Normal heart sounds.  Pulmonary:     Effort: Pulmonary effort is normal.     Breath sounds: Normal breath sounds.  Abdominal:     General: Bowel sounds are normal.     Palpations: Abdomen is soft.  Musculoskeletal:     Cervical back: Normal range of motion and neck supple.  Skin:    General: Skin is warm and dry.     Capillary Refill: Capillary refill takes less than 2 seconds.  Neurological:     Mental Status: He is alert and oriented to person, place, and time.  Psychiatric:        Behavior: Behavior normal.      Musculoskeletal Exam: Good range of motion of the cervical spine.  There was no tenderness over thoracic or lumbar  spine.  Right shoulder joint which was replaced had limited abduction and forward flexion of about 90 degrees and limited internal rotation.  Left shoulder joint was in full range of motion.  Elbow joints with good range of motion.  There was no synovitis over wrist joints, MCPs PIPs or DIPs.  Bilateral PIP and DIP thickening was noted.  Hip joints and knee joints in good range of motion.  There was no tenderness over ankles or MTPs.  No muscular weakness or tenderness was noted.  He was able to get up from the chair without any arm support.  CDAI Exam: CDAI Score: -- Patient Global: --; Provider Global: -- Swollen: --; Tender: -- Joint Exam 01/12/2024   No joint exam has been documented for this visit   There is currently no information documented on the homunculus. Go to the Rheumatology activity and complete the homunculus joint exam.  Investigation: No additional findings.  Imaging: No results found.  Recent Labs: Lab Results  Component Value Date   WBC 7.0 12/12/2023   HGB 17.6 (H) 12/12/2023   PLT 197 12/12/2023   NA 140 10/02/2023   K 3.8 10/02/2023   CL 106 10/02/2023   CO2 21 (L) 10/02/2023   GLUCOSE 143 (H) 10/02/2023   BUN 23 10/02/2023   CREATININE 0.94 10/02/2023   BILITOT 1.0 10/02/2023   ALKPHOS 75 10/02/2023   AST 22 10/02/2023   ALT 14 10/02/2023   PROT 6.8 10/02/2023   ALBUMIN 3.8 10/02/2023   CALCIUM  8.9 10/02/2023   GFRAA 104 07/10/2020    Speciality Comments: No specialty comments available.  Procedures:  No procedures performed Allergies: Rosiglitazone maleate, Evolocumab, and Tirzepatide   Assessment / Plan:     Visit Diagnoses: Other fatigue -patient gives history of increased fatigue since he was on Mounjaro.  He discontinued Mounjaro and Repatha.  He has noticed loss of muscle mass and generalized deconditioning.  He just finished physical therapy and has not noticed much improvement.  Plan: CK, TSH.  Will contact him with the  results.  Status post shoulder replacement, right-November 2023 per patient.  He has limited range of motion.  He plans to follow-up with [facility].  Primary osteoarthritis of both hands-bilateral PIP and DIP thickening with no synovitis was noted.  Primary osteoarthritis of both knees-no warmth  swelling or effusion was noted.  He continues to have some discomfort.  History of arthroplasty of left knee  Other chronic pain -he takes Tylenol  as needed.  Other medical problems listed as follows:  History of coronary artery disease  History of hypertension-blood pressure was elevated at 162/92.  History of TIA (transient ischemic attack)-he had several TIAs in the past.  History of diabetes mellitus  Vitamin D deficiency -he has history of vitamin D deficiency.  He is concerned about vitamin D deficiency.  Plan: VITAMIN D 25 Hydroxy (Vit-D Deficiency, Fractures).  Will contact her with the results.SABRA  MGUS (monoclonal gammopathy of unknown significance)-followed by hematology.  History of hypercholesterolemia  Orders: Orders Placed This Encounter  Procedures   CK   TSH   VITAMIN D 25 Hydroxy (Vit-D Deficiency, Fractures)   No orders of the defined types were placed in this encounter.    Follow-Up Instructions: Return if symptoms worsen or fail to improve, for Fatigue.   Maya Nash, MD  Note - This record has been created using Animal nutritionist.  Chart creation errors have been sought, but may not always  have been located. Such creation errors do not reflect on  the standard of medical care.

## 2024-01-03 ENCOUNTER — Ambulatory Visit

## 2024-01-03 DIAGNOSIS — R2681 Unsteadiness on feet: Secondary | ICD-10-CM | POA: Diagnosis not present

## 2024-01-03 DIAGNOSIS — M6281 Muscle weakness (generalized): Secondary | ICD-10-CM

## 2024-01-03 NOTE — Addendum Note (Signed)
 Addended by: Reham Slabaugh K on: 01/03/2024 11:21 AM   Modules accepted: Orders

## 2024-01-03 NOTE — Therapy (Signed)
 OUTPATIENT PHYSICAL THERAPY NEURO TREATMENT  and D/C Summary   Patient Name: Alan Wang MRN: [id number] DOB:January 07, 1951, 73 y.o., male Today's Date: 01/03/2024   PCP: Frederik Charleston, MD  REFERRING PROVIDER: Rosario Leatrice FERNS, MD  PHYSICAL THERAPY DISCHARGE SUMMARY  Visits from Start of Care: 12  Current functional level related to goals / functional outcomes: See below for outcome measures   Remaining deficits: Balance deficits   Education / Equipment: HEP   Patient agrees to discharge. Patient goals were partially met. Patient is being discharged due to being pleased with the current functional level.      END OF SESSION:  PT End of Session - 01/03/24 1613     Visit Number 12    Number of Visits 13    Date for Recertification  01/31/24    Authorization Type [employer]    PT Start Time 1615    PT Stop Time 1700    PT Time Calculation (min) 45 min    Equipment Utilized During Treatment Gait belt    Activity Tolerance Patient tolerated treatment well    Behavior During Therapy WFL for tasks assessed/performed              Past Medical History:  Diagnosis Date   Arthritis    Asthma    Cancer (HCC)    skin   Coronary artery disease    COVID    Diabetes mellitus    type 2, insulin  dependent for 30+ years   History of kidney stones 11/2012   Hyperlipemia    Hypertension    IDDM (insulin  dependent diabetes mellitus)    Shortness of breath    Stroke (HCC)    hx TIA   Past Surgical History:  Procedure Laterality Date   ANGIOPLASTY  04/06/2011   x3    CARDIAC CATHETERIZATION     CARDIAC CATHETERIZATION N/A 09/16/2015   Procedure: Left Heart Cath and Coronary Angiography;  Surgeon: Gordy Bergamo, MD;  Location: [facility] INVASIVE CV LAB;  Service: Cardiovascular;  Laterality: N/A;   cataracts     CHOLECYSTECTOMY     HERNIA REPAIR     x2   KNEE ARTHROSCOPY     LEFT HEART CATH AND CORONARY ANGIOGRAPHY N/A 01/19/2023   Procedure: LEFT HEART CATH AND  CORONARY ANGIOGRAPHY;  Surgeon: Verlin Lonni JONETTA, MD;  Location: [facility] INVASIVE CV LAB;  Service: Cardiovascular;  Laterality: N/A;   LEFT HEART CATHETERIZATION WITH CORONARY ANGIOGRAM N/A 03/08/2011   Procedure: LEFT HEART CATHETERIZATION WITH CORONARY ANGIOGRAM;  Surgeon: Erick JONELLE Bergamo, MD;  Location: [facility] CATH LAB;  Service: Cardiovascular;  Laterality: N/A;   PERCUTANEOUS CORONARY STENT INTERVENTION (PCI-S) N/A 04/06/2011   Procedure: PERCUTANEOUS CORONARY STENT INTERVENTION (PCI-S);  Surgeon: Erick JONELLE Bergamo, MD;  Location: [facility] CATH LAB;  Service: Cardiovascular;  Laterality: N/A;   TOTAL SHOULDER ARTHROPLASTY Right 01/07/2022   Procedure: TOTAL SHOULDER ARTHROPLASTY;  Surgeon: Melita Drivers, MD;  Location: [facility] ORS;  Service: Orthopedics;  Laterality: Right;    Patient Active Problem List   Diagnosis Date Noted   Secondary erythrocytosis 10/11/2023   Acute ischemic stroke (HCC) 10/02/2023   Expressive aphasia 10/02/2023   Polycythemia vera (HCC) 09/26/2023   Malnutrition of moderate degree 12/06/2022   Acute on chronic systolic CHF (congestive heart failure) (HCC) 12/04/2022   Elevated troponin 12/04/2022   Acute metabolic encephalopathy 08/22/2022   Acute on chronic diastolic CHF (congestive heart failure) (HCC) 08/22/2022   Demand ischemia (HCC) 08/22/2022   COVID-19 08/21/2022  TIA (transient ischemic attack) 06/22/2016   DM2 (diabetes mellitus, type 2) (HCC) 06/22/2016   Primary osteoarthritis of both hands 06/03/2016   Primary osteoarthritis of both knees 06/03/2016   Generalized pain 06/03/2016   Memory difficulties 06/03/2016   History of diabetes mellitus 06/03/2016   History of coronary artery disease 06/03/2016   History of hypertension 06/03/2016   History of hypercholesterolemia 06/03/2016   High risk medication use 06/03/2016   History of high cholesterol 06/03/2016   ANA positive 06/03/2016   Inflammatory arthritis 10/29/2015   Vitamin D deficiency  10/29/2015   Fatigue 09/24/2015   Vertigo 10/31/2013   Chronic cough 11/16/2012   CAD (coronary artery disease), native coronary artery 03/08/2011   Postsurgical percutaneous transluminal coronary angioplasty (PTCA) status 03/08/2011   Insulin  dependent type 2 diabetes mellitus (HCC) 03/08/2011   Essential hypertension 03/08/2011   Shortness of breath 03/08/2011   Mixed hyperlipidemia 03/08/2011    ONSET DATE: 10/01/23  REFERRING DIAG: R53.1 (ICD-10-CM) - Weakness  THERAPY DIAG:  Muscle weakness (generalized)  Unsteadiness on feet  Rationale for Evaluation and Treatment: Rehabilitation  SUBJECTIVE:                                                                                                                                                                                             SUBJECTIVE STATEMENT: Ache all over  Pt accompanied by: significant other in waiting room   PERTINENT HISTORY: Asthma, CAD, DMII, HLD, HTN, SOB, R TSA 2023  PAIN:  Are you having pain? Yes: NPRS scale: 3/10 Pain location: R shoulder Pain description: ache Aggravating factors: pt suspects medication side effects Relieving factors: nothing  PRECAUTIONS: Other: R shoulder pain limiting activity   RED FLAGS: None   WEIGHT BEARING RESTRICTIONS: No  FALLS: Has patient fallen in last 6 months? No  LIVING ENVIRONMENT: Lives with: lives with their spouse Lives in: House/apartment Stairs: 3 +1 steps, 2 story home; has stair lift which pt reports awkward/dizzy going up stairs without it Has following equipment at home: Single point cane, Walker - 2 wheeled, Environmental Consultant - 4 wheeled, shower chair, and Grab bars  PLOF: Independent with basic ADLs and reports difficulty bathing/dressing d/t R shoulder pain  PATIENT GOALS: pt does not report  OBJECTIVE:   TODAY'S TREATMENT: 01/03/24 Activity Comments  Seated LE PRE -LAQ 1x10,12,15 5# -hip add iso 1x10,12,15 -standing hip abd 1x10,12,15 5# -alt  stair taps 1x10,12,15 5#- 6 box  LTG performance/review   Static multisensory balance challenges                   PATIENT EDUCATION: Education details: HEP  update with edu to perform step up at counter for safety Person educated: Patient Education method: Explanation, Demonstration, Tactile cues, Verbal cues, and Handouts Education comprehension: verbalized understanding and returned demonstration    HOME EXERCISE PROGRAM Access Code: [email or web address] URL: [email or web address] Date: 12/06/2023 Prepared by: [facility] - [facility]  Program Notes get a pair of adjustable ankle weights ranging 1-5lbs   Exercises - Sit to Stand Without Arm Support  - 1 x daily - 5 x weekly - 2 sets - 10 reps - Seated Long Arc Quad  - 1 x daily - 5 x weekly - 2 sets - 10 reps - Standing March with Counter Support  - 1 x daily - 5 x weekly - 2 sets - 10 reps - Standing Hip Abduction with Counter Support  - 1 x daily - 5 x weekly - 2 sets - 10 reps - Seated Knee Extension with Resistance  - 1 x daily - 7 x weekly - 2 sets - 10 reps - Seated Hamstring Curls with Resistance  - 1 x daily - 7 x weekly - 2 sets - 10 reps - Standing 3-Way Leg Reach with Resistance at Ankles and Counter Support  - 1 x daily - 7 x weekly - 2 sets - 10 reps - Forward Step Up with Counter Support  - 1 x daily - 5 x weekly - 2 sets - 10 reps - Corner Balance Feet Together With Eyes Open  - 1 x daily - 7 x weekly - 3 sets - 30 sec hold - Corner Balance Feet Together With Eyes Closed  - 1 x daily - 7 x weekly - 3 sets - 30 sec hold - Corner Balance Feet Together: Eyes Open With Head Turns  - 1 x daily - 7 x weekly - 3 sets - 30 sec hold - Corner Balance Feet Together: Eyes Closed With Head Turns  - 1 x daily - 7 x weekly - 3 sets - 30 sec hold - Semi-Tandem Corner Balance With Eyes Open  - 1 x daily - 7 x weekly - 3 sets - 30 sec hold    PATIENT EDUCATION: Education details: HEP Person  educated: Patient Education method: Explanation, Demonstration, Tactile cues, Verbal cues, and Handouts Education comprehension: verbalized understanding and returned demonstration        Note: Objective measures were completed at Evaluation unless otherwise noted.  DIAGNOSTIC FINDINGS: 10/02/23 brain MRI: New focal area of susceptibility in the lateral aspect of the left thalamus is compatible with and interval punctate hemorrhage. This may be acute and related to the patient's symptoms . 2. Mildly advanced periventricular and subcortical T2 hyperintensities, similar to the prior exam. 3. Stable remote lacunar infarcts of the cerebellum.   COGNITION: Overall cognitive status: Within functional limits for tasks assessed   SENSATION: Pt denies N/T in UEs/LEs   COORDINATION: Alternating pronation/supination: WNL B Alternating toe tap: WNL B Finger to nose: slowed B   MUSCLE TONE: WNL B LEs   POSTURE: rounded shoulders and forward head, flexed trunk and frequently looking down   LOWER EXTREMITY ROM:     Active  Right Eval Left Eval  Hip flexion    Hip extension    Hip abduction    Hip adduction    Hip internal rotation    Hip external rotation    Knee flexion    Knee extension    Ankle dorsiflexion 4 deg with knee bent -3 with bent knee  Ankle plantarflexion    Ankle inversion    Ankle eversion     (Blank rows = not tested)  LOWER EXTREMITY MMT:    MMT Right Eval Left Eval Right 10/16 Left 10/16  Hip flexion 3+ 3+ 4 4  Hip extension      Hip abduction 4 4- 4 4  Hip adduction 4+ 4+ 5 5  Hip internal rotation      Hip external rotation      Knee flexion 3+ 3+ 4 4  Knee extension 4- 4 4 4+  Ankle dorsiflexion 4 4 4+ 4+  Ankle plantarflexion 4 4    Ankle inversion      Ankle eversion      (Blank rows = not tested)  GAIT: Findings: Assistive device utilized:None, Level of assistance: Modified independence, and Comments: slowed speed  FUNCTIONAL  TESTS:  5 times sit to stand: 18.16 sec without UEs  Timed up and go (TUG): 11.9 sec  Dynamic Gait Index: 19/24            GOALS: Goals reviewed with patient? Yes  SHORT TERM GOALS: Target date: 11/28/2023  Patient to be independent with initial HEP. Baseline: HEP initiated Goal status: NOT MET    LONG TERM GOALS: Target date: 12/26/2023  Patient to be independent with advanced HEP. Baseline: reports limited performance Goal status: NOT MET  Patient to demonstrate B LE strength >/=4+/5.  Baseline: 4+/5 BLE Goal status: MET  Patient to demonstrate B dorsiflexion AROM to at least 10 degrees to improve efficiency of gait.  Baseline: 4, -3 deg with compensations; 8 deg right, 10 left Goal status: Partially met  Patient to score at least 22/24 on DGI in order to decrease risk of falls.  Baseline: 19; 21/24 Goal status: NOT MET 10/16  Patient to demonstrate stair and curb navigation safely without handrail.   Baseline: Reciprocal w/ single HR Goal status: NOT MET  Patient to demonstrate 5xSTS test in <15 sec in order to decrease risk of falls.  Baseline: 18.16 sec; 17 sec; 15 sec Goal status: MET  Patient to report plans to maintain fitness regimen at home or in the community.  Baseline: pt reports sedentary lifestyle currently; education regarding community fitness programs/locations/beneficial activity Goal status: MET   ASSESSMENT:  CLINICAL IMPRESSION: Instructed in LE PRE to improve strength and activity tolerance and instruction in principles of PRE w/ verbalized understanding.  LTG performance and review with improved BLE strength evident and meeting 5xSTS measure.  Improved balance showoing low risk for falls per score 21 for DGI.  Education complete and pt pleased w/ current status and D/C to HEP  OBJECTIVE IMPAIRMENTS: Abnormal gait, decreased activity tolerance, decreased balance, decreased endurance, decreased knowledge of use of DME, difficulty  walking, decreased ROM, decreased strength, impaired flexibility, postural dysfunction, and pain.   ACTIVITY LIMITATIONS: carrying, lifting, bending, sitting, standing, squatting, sleeping, stairs, transfers, bathing, toileting, dressing, reach over head, hygiene/grooming, and locomotion level  PARTICIPATION LIMITATIONS: meal prep, shopping, community activity, and yard work  PERSONAL FACTORS: Age, Past/current experiences, Time since onset of injury/illness/exacerbation, and 3+ comorbidities: Asthma, CAD, DMII, HLD, HTN, SOB, R TSA 2023 are also affecting patient's functional outcome.   REHAB POTENTIAL: Good  CLINICAL DECISION MAKING: Evolving/moderate complexity  EVALUATION COMPLEXITY: Moderate  PLAN:  PT FREQUENCY: 2 x/week for 4 weeks followed by 1x/week for 4 weeks  PT DURATION: -  PLANNED INTERVENTIONS: 97164- PT Re-evaluation, 97110-Therapeutic exercises, 97530- Therapeutic activity, [id number]- Neuromuscular re-education, 97535- Self Care, 02859-  Manual therapy, [id number]- Gait training, 04007- Canalith repositioning, 02967- Electrical stimulation (manual), [phone] (1-2 muscles), 20561 (3+ muscles)- Dry Needling, Patient/Family education, Balance training, Stair training, Taping, Vestibular training, DME instructions, Cryotherapy, and Moist heat  PLAN FOR NEXT SESSION: D/C to HEP    4:14 PM, 01/03/24 M. Kelly Torianna Junio, PT, DPT Physical Therapist- [place] Office Number: [phone]

## 2024-01-12 ENCOUNTER — Ambulatory Visit: Attending: Rheumatology | Admitting: Rheumatology

## 2024-01-12 ENCOUNTER — Encounter: Payer: Self-pay | Admitting: Rheumatology

## 2024-01-12 VITALS — BP 164/92 | HR 75 | Temp 98.0°F | Resp 17 | Ht 70.5 in | Wt 185.8 lb

## 2024-01-12 DIAGNOSIS — D472 Monoclonal gammopathy: Secondary | ICD-10-CM | POA: Diagnosis not present

## 2024-01-12 DIAGNOSIS — R5383 Other fatigue: Secondary | ICD-10-CM

## 2024-01-12 DIAGNOSIS — M19042 Primary osteoarthritis, left hand: Secondary | ICD-10-CM

## 2024-01-12 DIAGNOSIS — E559 Vitamin D deficiency, unspecified: Secondary | ICD-10-CM | POA: Diagnosis not present

## 2024-01-12 DIAGNOSIS — Z8679 Personal history of other diseases of the circulatory system: Secondary | ICD-10-CM

## 2024-01-12 DIAGNOSIS — G8929 Other chronic pain: Secondary | ICD-10-CM

## 2024-01-12 DIAGNOSIS — Z96652 Presence of left artificial knee joint: Secondary | ICD-10-CM

## 2024-01-12 DIAGNOSIS — Z96611 Presence of right artificial shoulder joint: Secondary | ICD-10-CM | POA: Diagnosis not present

## 2024-01-12 DIAGNOSIS — M19041 Primary osteoarthritis, right hand: Secondary | ICD-10-CM

## 2024-01-12 DIAGNOSIS — Z8673 Personal history of transient ischemic attack (TIA), and cerebral infarction without residual deficits: Secondary | ICD-10-CM

## 2024-01-12 DIAGNOSIS — Z79899 Other long term (current) drug therapy: Secondary | ICD-10-CM

## 2024-01-12 DIAGNOSIS — M1380 Other specified arthritis, unspecified site: Secondary | ICD-10-CM

## 2024-01-12 DIAGNOSIS — Z8639 Personal history of other endocrine, nutritional and metabolic disease: Secondary | ICD-10-CM | POA: Diagnosis not present

## 2024-01-12 DIAGNOSIS — M170 Bilateral primary osteoarthritis of knee: Secondary | ICD-10-CM | POA: Diagnosis not present

## 2024-01-13 ENCOUNTER — Ambulatory Visit: Payer: Self-pay | Admitting: Rheumatology

## 2024-01-13 LAB — VITAMIN D 25 HYDROXY (VIT D DEFICIENCY, FRACTURES): Vit D, 25-Hydroxy: 34 ng/mL (ref 30–100)

## 2024-01-13 LAB — TSH: TSH: 2.92 m[IU]/L (ref 0.40–4.50)

## 2024-01-13 LAB — CK: Total CK: 48 U/L (ref 19–278)

## 2024-01-13 NOTE — Progress Notes (Signed)
 CK, TSH and vitamin D are all within normal limits.  Please forward results to his PCP.

## 2024-02-13 ENCOUNTER — Inpatient Hospital Stay

## 2024-02-13 ENCOUNTER — Inpatient Hospital Stay: Attending: Hematology and Oncology

## 2024-02-13 DIAGNOSIS — D751 Secondary polycythemia: Secondary | ICD-10-CM | POA: Diagnosis present

## 2024-02-13 DIAGNOSIS — D45 Polycythemia vera: Secondary | ICD-10-CM

## 2024-02-13 LAB — CBC WITH DIFFERENTIAL/PLATELET
Abs Immature Granulocytes: 0.02 K/uL (ref 0.00–0.07)
Basophils Absolute: 0 K/uL (ref 0.0–0.1)
Basophils Relative: 1 %
Eosinophils Absolute: 0.1 K/uL (ref 0.0–0.5)
Eosinophils Relative: 1 %
HCT: 52.4 % — ABNORMAL HIGH (ref 39.0–52.0)
Hemoglobin: 17.1 g/dL — ABNORMAL HIGH (ref 13.0–17.0)
Immature Granulocytes: 0 %
Lymphocytes Relative: 29 %
Lymphs Abs: 2.1 K/uL (ref 0.7–4.0)
MCH: 28.8 pg (ref 26.0–34.0)
MCHC: 32.6 g/dL (ref 30.0–36.0)
MCV: 88.4 fL (ref 80.0–100.0)
Monocytes Absolute: 0.7 K/uL (ref 0.1–1.0)
Monocytes Relative: 10 %
Neutro Abs: 4.4 K/uL (ref 1.7–7.7)
Neutrophils Relative %: 59 %
Platelets: 183 K/uL (ref 150–400)
RBC: 5.93 MIL/uL — ABNORMAL HIGH (ref 4.22–5.81)
RDW: 13.2 % (ref 11.5–15.5)
WBC: 7.3 K/uL (ref 4.0–10.5)
nRBC: 0 % (ref 0.0–0.2)

## 2024-02-13 NOTE — Patient Instructions (Signed)

## 2024-03-18 ENCOUNTER — Other Ambulatory Visit: Payer: Self-pay

## 2024-03-18 ENCOUNTER — Emergency Department (HOSPITAL_COMMUNITY)
Admission: EM | Admit: 2024-03-18 | Discharge: 2024-03-18 | Disposition: A | Discharge status: Home / Self Care | Attending: Emergency Medicine | Admitting: Emergency Medicine

## 2024-03-18 ENCOUNTER — Emergency Department (HOSPITAL_COMMUNITY)

## 2024-03-18 DIAGNOSIS — R531 Weakness: Secondary | ICD-10-CM | POA: Diagnosis not present

## 2024-03-18 DIAGNOSIS — R42 Dizziness and giddiness: Secondary | ICD-10-CM | POA: Diagnosis present

## 2024-03-18 LAB — COMPREHENSIVE METABOLIC PANEL WITH GFR
ALT: 6 U/L (ref 0–44)
AST: 18 U/L (ref 15–41)
Albumin: 4.1 g/dL (ref 3.5–5.0)
Alkaline Phosphatase: 95 U/L (ref 38–126)
Anion gap: 12 (ref 5–15)
BUN: 17 mg/dL (ref 8–23)
CO2: 24 mmol/L (ref 22–32)
Calcium: 9.3 mg/dL (ref 8.9–10.3)
Chloride: 102 mmol/L (ref 98–111)
Creatinine, Ser: 0.71 mg/dL (ref 0.61–1.24)
GFR, Estimated: >60 mL/min
Glucose, Bld: 163 mg/dL — ABNORMAL HIGH (ref 70–99)
Potassium: 3.6 mmol/L (ref 3.5–5.1)
Sodium: 139 mmol/L (ref 135–145)
Total Bilirubin: 0.5 mg/dL (ref 0.0–1.2)
Total Protein: 7.2 g/dL (ref 6.5–8.1)

## 2024-03-18 LAB — URINALYSIS, ROUTINE W REFLEX MICROSCOPIC
Bacteria, UA: NONE SEEN
Bilirubin Urine: NEGATIVE
Glucose, UA: NEGATIVE mg/dL
Ketones, ur: 5 mg/dL — AB
Leukocytes,Ua: NEGATIVE
Nitrite: NEGATIVE
Protein, ur: NEGATIVE mg/dL
Specific Gravity, Urine: 1.014 (ref 1.005–1.030)
pH: 5 (ref 5.0–8.0)

## 2024-03-18 LAB — CBC
HCT: 51.1 % (ref 39.0–52.0)
Hemoglobin: 16.4 g/dL (ref 13.0–17.0)
MCH: 28.2 pg (ref 26.0–34.0)
MCHC: 32.1 g/dL (ref 30.0–36.0)
MCV: 87.8 fL (ref 80.0–100.0)
Platelets: 227 K/uL (ref 150–400)
RBC: 5.82 MIL/uL — ABNORMAL HIGH (ref 4.22–5.81)
RDW: 13.5 % (ref 11.5–15.5)
WBC: 8.7 K/uL (ref 4.0–10.5)
nRBC: 0 % (ref 0.0–0.2)

## 2024-03-18 LAB — LIPASE, BLOOD: Lipase: 16 U/L (ref 11–51)

## 2024-03-18 LAB — TROPONIN T, HIGH SENSITIVITY
Troponin T High Sensitivity: 28 ng/L — ABNORMAL HIGH (ref 0–19)
Troponin T High Sensitivity: 28 ng/L — ABNORMAL HIGH (ref 0–19)

## 2024-03-18 MED ORDER — MECLIZINE HCL 25 MG PO TABS
25.0000 mg | ORAL_TABLET | Freq: Once | ORAL | Status: AC
  Administered 2024-03-18: 25 mg via ORAL
  Filled 2024-03-18: qty 1

## 2024-03-18 MED ORDER — MECLIZINE HCL 25 MG PO TABS: 25.0000 mg | ORAL_TABLET | Freq: Three times a day (TID) | ORAL | 0 refills | Status: AC | PRN

## 2024-03-18 MED ORDER — SODIUM CHLORIDE 0.9 % IV BOLUS
1000.0000 mL | Freq: Once | INTRAVENOUS | Status: AC
  Administered 2024-03-18: 1000 mL via INTRAVENOUS

## 2024-03-18 NOTE — ED Provider Notes (Signed)
 " [place] EMERGENCY DEPARTMENT AT [facility] Provider Note   CSN: [id number] Arrival date & time: 03/18/24  1409     Patient presents with: Weakness   Alan Wang is a 74 y.o. male.  {Add pertinent medical, surgical, social history, OB history to YEP:[id number]} Patient has a history of diabetes and hypertension.  He complains of dizziness today much worse when he moves around   Weakness      Prior to Admission medications  Medication Sig Start Date End Date Taking? Authorizing Provider  meclizine  (ANTIVERT ) 25 MG tablet Take 1 tablet (25 mg total) by mouth 3 (three) times daily as needed for dizziness. 03/18/24  Yes Suzette Pac, MD  acetaminophen  (TYLENOL ) 650 MG CR tablet Take 1,300 mg by mouth as needed for pain.    [provider]  carvedilol  (COREG ) 3.125 MG tablet Take 1 tablet (3.125 mg total) by mouth 2 (two) times daily with a meal. 10/02/23   Rosario Leatrice FERNS, MD  clopidogrel  (PLAVIX ) 75 MG tablet 1 tablet Orally Once a day; Duration: 30 days 10/05/23   [provider]  insulin  NPH (HUMULIN N,NOVOLIN N) 100 UNIT/ML injection Inject 5-25 Units into the skin See admin instructions. Inject 5 units into the skin before breakfast and 25 units before supper    [provider]  insulin  regular (NOVOLIN R,HUMULIN R ) 100 units/mL injection Inject 10-15 Units into the skin See admin instructions. Inject 10 units into the skin before breakfast and 15 units before supper    [provider]  JARDIANCE  10 MG TABS tablet Take 1 tablet (10 mg total) by mouth daily. 02/02/23   Ladona Heinz, MD  nitroGLYCERIN  (NITROSTAT ) 0.4 MG SL tablet Place 1 tablet (0.4 mg total) under the tongue every 5 (five) minutes as needed for chest pain. 12/06/22   Rai, Nydia POUR, MD  sacubitril -valsartan  (ENTRESTO ) 24-26 MG Take 1 tablet by mouth 2 (two) times daily. Patient not taking: Reported on 01/12/2024 02/02/23   Ladona Heinz, MD    Allergies: Rosiglitazone  maleate, Evolocumab, and Tirzepatide    Review of Systems  Neurological:  Positive for weakness.    Updated Vital Signs BP (!) 151/138   Pulse 97   Temp 98.2 F (36.8 C) (Oral)   Resp 12   Ht 5' 10 (1.778 m)   Wt 83.9 kg   SpO2 100%   BMI 26.54 kg/m   Physical Exam  (all labs ordered are listed, but only abnormal results are displayed) Labs Reviewed  COMPREHENSIVE METABOLIC PANEL WITH GFR - Abnormal; Notable for the following components:      Result Value   Glucose, Bld 163 (*)    All other components within normal limits  CBC - Abnormal; Notable for the following components:   RBC 5.82 (*)    All other components within normal limits  URINALYSIS, ROUTINE W REFLEX MICROSCOPIC - Abnormal; Notable for the following components:   Hgb urine dipstick SMALL (*)    Ketones, ur 5 (*)    All other components within normal limits  TROPONIN T, HIGH SENSITIVITY - Abnormal; Notable for the following components:   Troponin T High Sensitivity 28 (*)    All other components within normal limits  TROPONIN T, HIGH SENSITIVITY - Abnormal; Notable for the following components:   Troponin T High Sensitivity 28 (*)    All other components within normal limits  LIPASE, BLOOD    EKG: None  Radiology: CT Head Wo Contrast Result Date: 03/18/2024  CLINICAL DATA:  Memory loss EXAM: CT HEAD WITHOUT CONTRAST TECHNIQUE: Contiguous axial images were obtained from the base of the skull through the vertex without intravenous contrast. RADIATION DOSE REDUCTION: This exam was performed according to the departmental dose-optimization program which includes automated exposure control, adjustment of the mA and/or kV according to patient size and/or use of iterative reconstruction technique. COMPARISON:  10/02/2023 FINDINGS: Brain: No acute infarct or hemorrhage. Lateral ventricles and midline structures are unremarkable. No acute extra-axial fluid collections. No mass effect. Vascular: No hyperdense vessel  or unexpected calcification. Skull: Normal. Negative for fracture or focal lesion. Sinuses/Orbits: No acute finding. Other: None. IMPRESSION: 1. No acute intracranial process. Electronically Signed   By: Ozell Daring M.D.   On: 03/18/2024 19:49   DG Chest Port 1 View Result Date: 03/18/2024 EXAM: 1 VIEW(S) XRAY OF THE CHEST 03/18/2024 06:56:00 PM COMPARISON: None available. CLINICAL HISTORY: weak FINDINGS: LUNGS AND PLEURA: No focal pulmonary opacity. No pleural effusion. No pneumothorax. HEART AND MEDIASTINUM: No acute abnormality of the cardiac and mediastinal silhouettes. BONES AND SOFT TISSUES: Partially seen right shoulder arthroplasty. IMPRESSION: 1. No acute cardiopulmonary process. 2. Partially visualized right shoulder arthroplasty. Electronically signed by: Dorethia Molt MD MD 03/18/2024 07:00 PM EST RP Workstation: [id number]    {Document cardiac monitor, telemetry assessment procedure when appropriate:32947} Procedures   Medications Ordered in the ED  sodium chloride  0.9 % bolus 1,000 mL (1,000 mLs Intravenous New Bag/Given 03/18/24 1914)  meclizine  (ANTIVERT ) tablet 25 mg (25 mg Oral Given 03/18/24 1910)      {Click here for ABCD2, HEART and other calculators REFRESH Note before signing:1}                              Medical Decision Making Amount and/or Complexity of Data Reviewed Labs: ordered. Radiology: ordered. ECG/medicine tests: ordered.   Patient with vertigo.  He improved with Antivert  will follow-up with PCP  {Document critical care time when appropriate  Document review of labs and clinical decision tools ie CHADS2VASC2, etc  Document your independent review of radiology images and any outside records  Document your discussion with family members, caretakers and with consultants  Document social determinants of health affecting pt's care  Document your decision making why or why not admission, treatments were needed:32947:::1}   Final diagnoses:  Vertigo     ED Discharge Orders          Ordered    meclizine  (ANTIVERT ) 25 MG tablet  3 times daily PRN        03/18/24 2133             "

## 2024-03-18 NOTE — ED Triage Notes (Addendum)
 Patient reports generalized weakness for weeks, this AM having dizziness/imbalance, now having abdominal pain. Denies fever/n/v. Patient is alert and oriented x 4. Airway patent, respirations even and unlabored. Skin normal, warm and dry. Patient has appt with neuro in February.

## 2024-03-18 NOTE — Discharge Instructions (Signed)
 Try to use a walker to ambulate with and follow-up with your family doctor this week

## 2024-03-23 ENCOUNTER — Encounter: Payer: Self-pay | Admitting: Diagnostic Neuroimaging

## 2024-03-23 ENCOUNTER — Ambulatory Visit: Admitting: Diagnostic Neuroimaging

## 2024-03-23 VITALS — BP 143/88 | HR 80 | Ht 70.5 in | Wt 189.8 lb

## 2024-03-23 DIAGNOSIS — G629 Polyneuropathy, unspecified: Secondary | ICD-10-CM | POA: Diagnosis not present

## 2024-03-23 DIAGNOSIS — R269 Unspecified abnormalities of gait and mobility: Secondary | ICD-10-CM

## 2024-03-23 DIAGNOSIS — R531 Weakness: Secondary | ICD-10-CM | POA: Diagnosis not present

## 2024-03-23 DIAGNOSIS — Z9181 History of falling: Secondary | ICD-10-CM

## 2024-03-23 NOTE — Progress Notes (Signed)
 "  [facility]  PATIENT: Alan Wang DOB: November 07, 1950  REFERRING CLINICIAN: Tommas Pears, MD  HISTORY FROM: patient and wife  REASON FOR VISIT: new consult   HISTORICAL  CHIEF COMPLAINT:  Chief Complaint  Patient presents with   RM 6    Patient is here with daughter and wife for gait, dizzy spells, and has a history of strokes - the last month has been having more muscle weakness and was at the hospital Sunday and was told it was an inner ear infection and is currently on medication with no change in symptoms     HISTORY OF PRESENT ILLNESS:   UPDATE (03/23/24, VRP): Since last visit, more issues with weakness, gait diff, falls. Had 50 lb weight loss in 2025 (on mounjaro; also on repatha). Stopped these in summer 2025 due to possible side effects.   UPDATE (08/03/22, VRP): Since last visit, more issues with balance and falls. DIABETES is better controlled. Not that active lately. Decreased stamina. Some short term memory issues. Maintaining ADLs.   PRIOR HPI (09/10/13): 74 year old right-handed male with intermittent dizziness. Has history of diabetes, TIA, cardiac stents.  For past 6 weeks basis of intermittent dizziness episodes consisting of losing balance, leaning to one side, staggering. Symptoms only occur when he moves from sitting to standing position or if he has been walking or exerting himself. Symptoms do not occur when he is sitting or laying down. No spinning sensation. No falls. No palpitations or sweating. She's had episodes 5-6 weeks. These occur 3-4 days out of the week and up to 2 times per day.  Contribute factors can include physical exertion, decreased fluid intake, GI illness. One episode that was more prolonged occur after he was mowing the lawn outside and began to feel very dizzy/lightheaded.  Patient has checked his blood sugar blood pressure with some of these episodes and has been unremarkable.   REVIEW OF SYSTEMS: Full 14 system review  of systems performed and negative except: as per HPI.   ALLERGIES: Allergies  Allergen Reactions   Rosiglitazone Maleate Swelling    rosiglitazone   Evolocumab Other (See Comments)    Repatha- flu like symptoms    Tirzepatide Other (See Comments)    Mounjaro- severe constipation     HOME MEDICATIONS: Outpatient Medications Prior to Visit  Medication Sig Dispense Refill   acetaminophen  (TYLENOL ) 650 MG CR tablet Take 1,300 mg by mouth as needed for pain.     clopidogrel  (PLAVIX ) 75 MG tablet 1 tablet Orally Once a day; Duration: 30 days     insulin  NPH (HUMULIN N,NOVOLIN N) 100 UNIT/ML injection Inject 5-25 Units into the skin See admin instructions. Inject 5 units into the skin before breakfast and 25 units before supper     insulin  regular (NOVOLIN R,HUMULIN R ) 100 units/mL injection Inject 10-15 Units into the skin See admin instructions. Inject 10 units into the skin before breakfast and 15 units before supper     meclizine  (ANTIVERT ) 25 MG tablet Take 1 tablet (25 mg total) by mouth 3 (three) times daily as needed for dizziness. 30 tablet 0   nitroGLYCERIN  (NITROSTAT ) 0.4 MG SL tablet Place 1 tablet (0.4 mg total) under the tongue every 5 (five) minutes as needed for chest pain. 30 tablet 0   sacubitril -valsartan  (ENTRESTO ) 24-26 MG Take 1 tablet by mouth 2 (two) times daily. 180 tablet 3   carvedilol  (COREG ) 3.125 MG tablet Take 1 tablet (3.125 mg total) by mouth 2 (two) times daily with  a meal. (Patient not taking: Reported on 03/23/2024) 60 tablet 1   JARDIANCE  10 MG TABS tablet Take 1 tablet (10 mg total) by mouth daily. (Patient not taking: Reported on 03/23/2024) 90 tablet 3   No facility-administered medications prior to visit.    PAST MEDICAL HISTORY: Past Medical History:  Diagnosis Date   Arthritis    Asthma    Cancer (HCC)    skin   Coronary artery disease    COVID    Diabetes mellitus    type 2, insulin  dependent for 30+ years   History of kidney stones 11/2012    Hyperlipemia    Hypertension    IDDM (insulin  dependent diabetes mellitus)    Shortness of breath    Stroke (HCC)    hx TIA    PAST SURGICAL HISTORY: Past Surgical History:  Procedure Laterality Date   ANGIOPLASTY  04/06/2011   x3    CARDIAC CATHETERIZATION     CARDIAC CATHETERIZATION N/A 09/16/2015   Procedure: Left Heart Cath and Coronary Angiography;  Surgeon: Gordy Bergamo, MD;  Location: [facility] INVASIVE CV LAB;  Service: Cardiovascular;  Laterality: N/A;   cataracts     CHOLECYSTECTOMY     HERNIA REPAIR     x2   KNEE ARTHROSCOPY     LEFT HEART CATH AND CORONARY ANGIOGRAPHY N/A 01/19/2023   Procedure: LEFT HEART CATH AND CORONARY ANGIOGRAPHY;  Surgeon: Verlin Lonni BIRCH, MD;  Location: [facility] INVASIVE CV LAB;  Service: Cardiovascular;  Laterality: N/A;   LEFT HEART CATHETERIZATION WITH CORONARY ANGIOGRAM N/A 03/08/2011   Procedure: LEFT HEART CATHETERIZATION WITH CORONARY ANGIOGRAM;  Surgeon: Erick JONELLE Bergamo, MD;  Location: [facility] CATH LAB;  Service: Cardiovascular;  Laterality: N/A;   PERCUTANEOUS CORONARY STENT INTERVENTION (PCI-S) N/A 04/06/2011   Procedure: PERCUTANEOUS CORONARY STENT INTERVENTION (PCI-S);  Surgeon: Erick JONELLE Bergamo, MD;  Location: [facility] CATH LAB;  Service: Cardiovascular;  Laterality: N/A;   TOTAL SHOULDER ARTHROPLASTY Right 01/07/2022   Procedure: TOTAL SHOULDER ARTHROPLASTY;  Surgeon: Melita Drivers, MD;  Location: [facility] ORS;  Service: Orthopedics;  Laterality: Right;     FAMILY HISTORY: Family History  Problem Relation Age of Onset   Emphysema Mother    COPD Mother    Alzheimer's disease Father    Congestive Heart Failure Sister    Diabetes Sister    Chronic fatigue Sister    Heart attack Paternal Grandfather    Asthma Paternal Aunt    Migraines Neg Hx    Seizures Neg Hx    Stroke Neg Hx    Sleep apnea Neg Hx     SOCIAL HISTORY:  Social History   Socioeconomic History   Marital status: Married    Spouse name: Alan Wang   Number of children: 2    Years of education: College   Highest education level: Not on file  Occupational History   Occupation: retired    Comment: firefighter  Tobacco Use   Smoking status: Never    Passive exposure: Past   Smokeless tobacco: Never  Vaping Use   Vaping status: Never Used  Substance and Sexual Activity   Alcohol use: Yes    Comment: occasionally   Drug use: No   Sexual activity: Yes  Other Topics Concern   Not on file  Social History Narrative   Patient lives at home with his spouse.   Caffeine Use: occasionally - 1 mug of coffee in the mornings    Social Drivers of Health   Tobacco Use:  Low Risk (03/23/2024)   Patient History    Smoking Tobacco Use: Never    Smokeless Tobacco Use: Never    Passive Exposure: Past  Financial Resource Strain: Low Risk  (11/09/2023)   Received from [facility]   Overall Financial Resource Strain (CARDIA)    Difficulty of Paying Living Expenses: Not hard at all  Food Insecurity: No Food Insecurity (11/09/2023)   Received from [facility]   Epic    Within the past 12 months, you worried that your food would run out before you got the money to buy more.: Never true    Within the past 12 months, the food you bought just didn't last and you didn't have money to get more.: Never true  Transportation Needs: No Transportation Needs (11/09/2023)   Received from [facility] - Transportation    In the past 12 months, has lack of transportation kept you from medical appointments or from getting medications?: No    Lack of Transportation (Non-Medical): No  Physical Activity: Not on file  Stress: Not on file  Social Connections: Not on file  Intimate Partner Violence: Not At Risk (12/04/2022)   Humiliation, Afraid, Rape, and Kick questionnaire    Fear of Current or Ex-Partner: No    Emotionally Abused: No    Physically Abused: No    Sexually Abused: No  Depression (PHQ2-9): Low Risk  (12/12/2023)   Depression (PHQ2-9)    PHQ-2 Score: 4  Alcohol Screen: Not on file  Housing: Unknown (11/09/2023)   Received from [facility]   Epic    In the last 12 months, was there a time when you were not able to pay the mortgage or rent on time?: No    Number of Times Moved in the Last Year: Not on file    At any time in the past 12 months, were you homeless or living in a shelter (including now)?: No  Utilities: Not At Risk (11/09/2023)   Received from [facility]   Epic    In the past 12 months has the electric, gas, oil, or water  company threatened to shut off services in your home?: No  Health Literacy: Not on file     PHYSICAL EXAM  GENERAL EXAM/CONSTITUTIONAL: Vitals:  Vitals:   03/23/24 0847  BP: (!) 143/88  Pulse: 80  Weight: 189 lb 12.8 oz (86.1 kg)  Height: 5' 10.5 (1.791 m)   Body mass index is 26.85 kg/m. Wt Readings from Last 3 Encounters:  03/23/24 189 lb 12.8 oz (86.1 kg)  03/18/24 185 lb (83.9 kg)  01/12/24 185 lb 12.8 oz (84.3 kg)   Patient is in no distress; well developed, nourished and groomed; neck is supple  CARDIOVASCULAR: Examination of carotid arteries is normal; no carotid bruits Regular rate and rhythm, no murmurs Examination of peripheral vascular system by observation and palpation is normal  EYES: Ophthalmoscopic exam of optic discs and posterior segments is normal; no papilledema or hemorrhages No results found.  MUSCULOSKELETAL: Gait, strength, tone, movements noted in Neurologic exam below  NEUROLOGIC: MENTAL STATUS:      No data to display         awake, alert, oriented to person, place and time recent and remote memory intact normal attention and concentration language fluent, comprehension intact, naming intact fund of knowledge appropriate  CRANIAL NERVE:  2nd - no papilledema on fundoscopic exam 2nd, 3rd, 4th, 6th - pupils  equal and reactive to light, visual fields full to  confrontation, extraocular muscles intact, no nystagmus 5th - facial sensation symmetric 7th - facial strength symmetric 8th - hearing intact 9th - palate elevates symmetrically, uvula midline 11th - shoulder shrug symmetric 12th - tongue protrusion midline  MOTOR:  normal bulk and tone RUE DELTOID, 3, TRICEPS 4, BICEPS 4, GRIP 3+; LUE 5 BLE HF 4+; KE / KF 5, DF 4+  SENSORY:  normal and symmetric to light touch, temperature, vibration; EXCEPT ABSENT IN FEET / ANKLES; DECR IN RIGHT KNEE  COORDINATION:  finger-nose-finger, fine finger movements normal  REFLEXES:  deep tendon reflexes 1+ and symmetric; ABSENT AT ANKLES  GAIT/STATION:  SLOW TO RISE; UNSTEADY    DIAGNOSTIC DATA (LABS, IMAGING, TESTING) - I reviewed patient records, labs, notes, testing and imaging myself where available.  Lab Results  Component Value Date   WBC 8.7 03/18/2024   HGB 16.4 03/18/2024   HCT 51.1 03/18/2024   MCV 87.8 03/18/2024   PLT 227 03/18/2024      Component Value Date/Time   NA 139 03/18/2024 1822   NA 143 01/11/2023 1501   NA 140 09/22/2015 1526   K 3.6 03/18/2024 1822   K 3.9 09/22/2015 1526   CL 102 03/18/2024 1822   CO2 24 03/18/2024 1822   CO2 27 09/22/2015 1526   GLUCOSE 163 (H) 03/18/2024 1822   GLUCOSE 75 09/22/2015 1526   BUN 17 03/18/2024 1822   BUN 22 01/11/2023 1501   BUN 15.8 09/22/2015 1526   CREATININE 0.71 03/18/2024 1822   CREATININE 0.88 09/26/2023 1240   CREATININE 0.88 02/09/2021 1348   CREATININE 0.8 09/22/2015 1526   CALCIUM  9.3 03/18/2024 1822   CALCIUM  9.0 09/22/2015 1526   PROT 7.2 03/18/2024 1822   PROT 6.9 09/22/2015 1526   PROT 6.4 09/22/2015 1525   ALBUMIN 4.1 03/18/2024 1822   ALBUMIN 3.7 09/22/2015 1526   AST 18 03/18/2024 1822   AST 14 (L) 09/26/2023 1240   AST 18 09/22/2015 1526   ALT 6 03/18/2024 1822   ALT 7 09/26/2023 1240   ALT 16 09/22/2015 1526   ALKPHOS 95 03/18/2024 1822   ALKPHOS 77 09/22/2015 1526   BILITOT 0.5 03/18/2024  1822   BILITOT 0.6 09/26/2023 1240   BILITOT 0.47 09/22/2015 1526   GFRNONAA >60 03/18/2024 1822   GFRNONAA >60 09/26/2023 1240   GFRNONAA 90 07/10/2020 0000   GFRAA 104 07/10/2020 0000   Lab Results  Component Value Date   CHOL 109 10/02/2023   HDL 39 (L) 10/02/2023   LDLCALC 54 10/02/2023   TRIG 81 10/02/2023   CHOLHDL 2.8 10/02/2023   Lab Results  Component Value Date   HGBA1C 5.8 (H) 10/02/2023   11/30/23 A1c 6.2  No results found for: VITAMINB12  Lab Results  Component Value Date   TSH 2.92 01/12/2024    06/22/16 MRI brain 1. No acute intracranial abnormality identified. 2. Mild chronic microvascular ischemic changes and mild parenchymal volume loss of the brain. 3. Left parietal lobe punctate susceptibility focus may represent hemosiderin deposition from old microhemorrhage or a tiny cavernoma.  10/02/23 MRI brain  1. New focal area of susceptibility in the lateral aspect of the left thalamus is compatible with and interval punctate hemorrhage. This may be acute and related to the patient's symptoms . 2. Mildly advanced periventricular and subcortical T2 hyperintensities, similar to the prior exam. 3. Stable remote lacunar infarcts of the cerebellum.    ASSESSMENT AND PLAN  74 y.o. year old male here with:  Dx:   1. Gait difficulty   2. At high risk for falls   3. Weakness   4. Neuropathy      PLAN:  PROGRESSIVE GAIT DIFFICULTY / NUMBNESS / WEAKNESS (since ~2024; worse in 2025; likely related to diabetic neuropathy, weight loss, deconditioning; multiple falls; progressive weakness in upper and lower ext) - check MRI cervical (eval for cervical myelopathy) - check MRI lumbar spine (eval for spinal stenosis) - check neuropathy labs - use cane / walker  Orders Placed This Encounter  Procedures   MR CERVICAL SPINE W WO CONTRAST   MR LUMBAR SPINE WO CONTRAST   Vitamin B12   A1c   TSH   SPEP with IFE   ANA w/Reflex   SSA, SSB   Vitamin B1    Vitamin B6   Return in about 3 months (around 06/21/2024) for MyChart visit (15 min).   EDUARD FABIENE HANLON, MD 03/23/2024, 10:15 AM Certified in Neurology, Neurophysiology and Neuroimaging  [facility] [address] [place], [place] [address] [phone] "

## 2024-03-25 LAB — ANA W/REFLEX

## 2024-03-26 LAB — ENA+DNA/DS+ANTICH+CENTRO+FA...
Anti JO-1: <0.2 AI (ref 0.0–0.9)
Antiribosomal P Antibodies: <0.2 AI (ref 0.0–0.9)
Centromere Ab Screen: <0.2 AI (ref 0.0–0.9)
Chromatin Ab SerPl-aCnc: <0.2 AI (ref 0.0–0.9)
ENA RNP Ab: <0.2 AI (ref 0.0–0.9)
ENA SM Ab Ser-aCnc: <0.2 AI (ref 0.0–0.9)
ENA SSA (RO) Ab: <0.2 AI (ref 0.0–0.9)
ENA SSB (LA) Ab: <0.2 AI (ref 0.0–0.9)
Homogeneous Pattern: 1:80 {titer}
Scleroderma (Scl-70) (ENA) Antibody, IgG: <0.2 AI (ref 0.0–0.9)
Smith/RNP Antibodies: <0.2 AI (ref 0.0–0.9)
Speckled Pattern: 1:80 {titer}
dsDNA Ab: 1 [IU]/mL (ref 0–9)

## 2024-03-26 LAB — ANA W/REFLEX: ANA Titer 1: POSITIVE — AB

## 2024-03-27 ENCOUNTER — Encounter: Payer: Self-pay | Admitting: Diagnostic Neuroimaging

## 2024-03-28 LAB — MULTIPLE MYELOMA PANEL, SERUM
Albumin SerPl Elph-Mcnc: 3.4 g/dL (ref 2.9–4.4)
Albumin/Glob SerPl: 1.1 (ref 0.7–1.7)
Alpha 1: 0.3 g/dL (ref 0.0–0.4)
Alpha2 Glob SerPl Elph-Mcnc: 0.9 g/dL (ref 0.4–1.0)
B-Globulin SerPl Elph-Mcnc: 1.2 g/dL (ref 0.7–1.3)
Gamma Glob SerPl Elph-Mcnc: 0.9 g/dL (ref 0.4–1.8)
Globulin, Total: 3.3 g/dL (ref 2.2–3.9)
IgG (Immunoglobin G), Serum: 897 mg/dL (ref 603–1613)
IgM (Immunoglobulin M), Srm: 136 mg/dL (ref 15–143)
Immunoglobulin A, (IgA) QN, Serum: 327 mg/dL (ref 61–437)
Total Protein: 6.7 g/dL (ref 6.0–8.5)

## 2024-03-28 LAB — VITAMIN B1: Thiamine: 140 nmol/L (ref 66.5–200.0)

## 2024-03-28 LAB — VITAMIN B12: Vitamin B-12: 225 pg/mL — ABNORMAL LOW (ref 232–1245)

## 2024-03-28 LAB — HEMOGLOBIN A1C
Est. average glucose Bld gHb Est-mCnc: 157 mg/dL
Hgb A1c MFr Bld: 7.1 % — ABNORMAL HIGH (ref 4.8–5.6)

## 2024-03-28 LAB — VITAMIN B6

## 2024-03-28 LAB — SJOGREN'S SYNDROME ANTIBODS(SSA + SSB)
ENA SSA (RO) Ab: <0.2 AI (ref 0.0–0.9)
ENA SSB (LA) Ab: <0.2 AI (ref 0.0–0.9)

## 2024-03-28 LAB — TSH: TSH: 3.52 u[IU]/mL (ref 0.450–4.500)

## 2024-04-02 ENCOUNTER — Ambulatory Visit: Payer: Self-pay | Admitting: Diagnostic Neuroimaging

## 2024-04-05 ENCOUNTER — Ambulatory Visit
Admission: RE | Admit: 2024-04-05 | Discharge: 2024-04-05 | Disposition: A | Discharge status: Home / Self Care | Source: Ambulatory Visit | Attending: Diagnostic Neuroimaging | Admitting: Diagnostic Neuroimaging

## 2024-04-05 DIAGNOSIS — G629 Polyneuropathy, unspecified: Secondary | ICD-10-CM

## 2024-04-05 DIAGNOSIS — R269 Unspecified abnormalities of gait and mobility: Secondary | ICD-10-CM

## 2024-04-05 DIAGNOSIS — R531 Weakness: Secondary | ICD-10-CM

## 2024-04-05 MED ORDER — GADOPICLENOL 0.5 MMOL/ML IV SOLN
9.0000 mL | Freq: Once | INTRAVENOUS | Status: AC | PRN
  Administered 2024-04-05: 9 mL via INTRAVENOUS

## 2024-04-16 ENCOUNTER — Inpatient Hospital Stay

## 2024-04-16 ENCOUNTER — Inpatient Hospital Stay: Attending: Hematology and Oncology | Admitting: Hematology and Oncology

## 2024-04-25 ENCOUNTER — Institutional Professional Consult (permissible substitution): Admitting: Diagnostic Neuroimaging

## 2024-06-25 ENCOUNTER — Telehealth: Admitting: Diagnostic Neuroimaging
